# Patient Record
Sex: Male | Born: 1945
Health system: Southern US, Community
[De-identification: ages and names within clinical notes are randomized; demographics above are authoritative.]

## PROBLEM LIST (undated history)

## (undated) DIAGNOSIS — E119 Type 2 diabetes mellitus without complications: Secondary | ICD-10-CM

## (undated) DIAGNOSIS — I493 Ventricular premature depolarization: Secondary | ICD-10-CM

## (undated) DIAGNOSIS — I639 Cerebral infarction, unspecified: Secondary | ICD-10-CM

## (undated) DIAGNOSIS — I5022 Chronic systolic (congestive) heart failure: Secondary | ICD-10-CM

## (undated) DIAGNOSIS — I2699 Other pulmonary embolism without acute cor pulmonale: Secondary | ICD-10-CM

## (undated) DIAGNOSIS — I679 Cerebrovascular disease, unspecified: Secondary | ICD-10-CM

## (undated) DIAGNOSIS — E785 Hyperlipidemia, unspecified: Secondary | ICD-10-CM

## (undated) DIAGNOSIS — R0600 Dyspnea, unspecified: Secondary | ICD-10-CM

## (undated) DIAGNOSIS — I712 Thoracic aortic aneurysm, without rupture, unspecified: Secondary | ICD-10-CM

## (undated) DIAGNOSIS — I2510 Atherosclerotic heart disease of native coronary artery without angina pectoris: Secondary | ICD-10-CM

## (undated) DIAGNOSIS — N400 Enlarged prostate without lower urinary tract symptoms: Secondary | ICD-10-CM

## (undated) DIAGNOSIS — I743 Embolism and thrombosis of arteries of the lower extremities: Secondary | ICD-10-CM

## (undated) DIAGNOSIS — I10 Essential (primary) hypertension: Secondary | ICD-10-CM

## (undated) HISTORY — PX: CARDIAC SURGERY: SHX584

---

## 2000-08-19 HISTORY — PX: PROSTATE SURGERY: SHX751

## 2005-08-19 DIAGNOSIS — I2510 Atherosclerotic heart disease of native coronary artery without angina pectoris: Secondary | ICD-10-CM

## 2005-08-19 HISTORY — DX: Atherosclerotic heart disease of native coronary artery without angina pectoris: I25.10

## 2017-04-17 ENCOUNTER — Inpatient Hospital Stay (HOSPITAL_COMMUNITY)
Admission: EM | Admit: 2017-04-17 | Discharge: 2017-04-20 | DRG: 293 | Disposition: A | Discharge status: Home / Self Care | Payer: Medicaid Other | Attending: Cardiology | Admitting: Cardiology

## 2017-04-17 ENCOUNTER — Emergency Department (HOSPITAL_COMMUNITY): Payer: Medicaid Other

## 2017-04-17 ENCOUNTER — Encounter (HOSPITAL_COMMUNITY): Payer: Self-pay | Admitting: Emergency Medicine

## 2017-04-17 DIAGNOSIS — E7800 Pure hypercholesterolemia, unspecified: Secondary | ICD-10-CM | POA: Diagnosis present

## 2017-04-17 DIAGNOSIS — Z8249 Family history of ischemic heart disease and other diseases of the circulatory system: Secondary | ICD-10-CM

## 2017-04-17 DIAGNOSIS — Z23 Encounter for immunization: Secondary | ICD-10-CM

## 2017-04-17 DIAGNOSIS — Z9889 Other specified postprocedural states: Secondary | ICD-10-CM

## 2017-04-17 DIAGNOSIS — Z951 Presence of aortocoronary bypass graft: Secondary | ICD-10-CM

## 2017-04-17 DIAGNOSIS — Z8673 Personal history of transient ischemic attack (TIA), and cerebral infarction without residual deficits: Secondary | ICD-10-CM

## 2017-04-17 DIAGNOSIS — Z86711 Personal history of pulmonary embolism: Secondary | ICD-10-CM

## 2017-04-17 DIAGNOSIS — E785 Hyperlipidemia, unspecified: Secondary | ICD-10-CM | POA: Diagnosis present

## 2017-04-17 DIAGNOSIS — I110 Hypertensive heart disease with heart failure: Principal | ICD-10-CM | POA: Diagnosis present

## 2017-04-17 DIAGNOSIS — I255 Ischemic cardiomyopathy: Secondary | ICD-10-CM | POA: Diagnosis present

## 2017-04-17 DIAGNOSIS — R718 Other abnormality of red blood cells: Secondary | ICD-10-CM | POA: Diagnosis present

## 2017-04-17 DIAGNOSIS — R0600 Dyspnea, unspecified: Secondary | ICD-10-CM

## 2017-04-17 DIAGNOSIS — I5023 Acute on chronic systolic (congestive) heart failure: Secondary | ICD-10-CM | POA: Diagnosis present

## 2017-04-17 DIAGNOSIS — E119 Type 2 diabetes mellitus without complications: Secondary | ICD-10-CM | POA: Diagnosis present

## 2017-04-17 DIAGNOSIS — I509 Heart failure, unspecified: Secondary | ICD-10-CM

## 2017-04-17 DIAGNOSIS — I2510 Atherosclerotic heart disease of native coronary artery without angina pectoris: Secondary | ICD-10-CM | POA: Diagnosis present

## 2017-04-17 HISTORY — DX: Hyperlipidemia, unspecified: E78.5

## 2017-04-17 HISTORY — DX: Atherosclerotic heart disease of native coronary artery without angina pectoris: I25.10

## 2017-04-17 HISTORY — DX: Cerebral infarction, unspecified: I63.9

## 2017-04-17 HISTORY — DX: Essential (primary) hypertension: I10

## 2017-04-17 HISTORY — DX: Type 2 diabetes mellitus without complications: E11.9

## 2017-04-17 HISTORY — DX: Other pulmonary embolism without acute cor pulmonale: I26.99

## 2017-04-17 LAB — CBC WITH DIFFERENTIAL/PLATELET
Basophils Absolute: 0.1 10*3/uL (ref 0.0–0.1)
Basophils Relative: 1 %
EOS ABS: 0.2 10*3/uL (ref 0.0–0.7)
EOS PCT: 2 %
HCT: 38.3 % — ABNORMAL LOW (ref 39.0–52.0)
Hemoglobin: 13.9 g/dL (ref 13.0–17.0)
LYMPHS ABS: 2.9 10*3/uL (ref 0.7–4.0)
Lymphocytes Relative: 36 %
MCH: 25.7 pg — AB (ref 26.0–34.0)
MCHC: 36.3 g/dL — ABNORMAL HIGH (ref 30.0–36.0)
MCV: 70.8 fL — ABNORMAL LOW (ref 78.0–100.0)
MONO ABS: 0.6 10*3/uL (ref 0.1–1.0)
Monocytes Relative: 7 %
NEUTROS PCT: 54 %
Neutro Abs: 4.2 10*3/uL (ref 1.7–7.7)
PLATELETS: 239 10*3/uL (ref 150–400)
RBC: 5.41 MIL/uL (ref 4.22–5.81)
RDW: 14 % (ref 11.5–15.5)
WBC: 8 10*3/uL (ref 4.0–10.5)

## 2017-04-17 LAB — I-STAT CHEM 8, ED
BUN: 28 mg/dL — AB (ref 6–20)
CALCIUM ION: 1.11 mmol/L — AB (ref 1.15–1.40)
CREATININE: 1 mg/dL (ref 0.61–1.24)
Chloride: 97 mmol/L — ABNORMAL LOW (ref 101–111)
Glucose, Bld: 215 mg/dL — ABNORMAL HIGH (ref 65–99)
HCT: 42 % (ref 39.0–52.0)
HEMOGLOBIN: 14.3 g/dL (ref 13.0–17.0)
Potassium: 3.8 mmol/L (ref 3.5–5.1)
SODIUM: 134 mmol/L — AB (ref 135–145)
TCO2: 25 mmol/L (ref 22–32)

## 2017-04-17 LAB — D-DIMER, QUANTITATIVE: D-Dimer, Quant: 0.77 ug/mL-FEU — ABNORMAL HIGH (ref 0.00–0.50)

## 2017-04-17 LAB — I-STAT TROPONIN, ED: TROPONIN I, POC: 0.06 ng/mL (ref 0.00–0.08)

## 2017-04-17 LAB — GLUCOSE, CAPILLARY
GLUCOSE-CAPILLARY: 174 mg/dL — AB (ref 65–99)
Glucose-Capillary: 216 mg/dL — ABNORMAL HIGH (ref 65–99)

## 2017-04-17 LAB — COMPREHENSIVE METABOLIC PANEL
ALK PHOS: 71 U/L (ref 38–126)
ALT: 34 U/L (ref 17–63)
AST: 31 U/L (ref 15–41)
Albumin: 3.5 g/dL (ref 3.5–5.0)
Anion gap: 11 (ref 5–15)
BILIRUBIN TOTAL: 0.7 mg/dL (ref 0.3–1.2)
BUN: 27 mg/dL — AB (ref 6–20)
CALCIUM: 9.3 mg/dL (ref 8.9–10.3)
CHLORIDE: 98 mmol/L — AB (ref 101–111)
CO2: 24 mmol/L (ref 22–32)
CREATININE: 1.22 mg/dL (ref 0.61–1.24)
GFR, EST NON AFRICAN AMERICAN: 58 mL/min — AB (ref >60)
Glucose, Bld: 217 mg/dL — ABNORMAL HIGH (ref 65–99)
Potassium: 3.8 mmol/L (ref 3.5–5.1)
Sodium: 133 mmol/L — ABNORMAL LOW (ref 135–145)
Total Protein: 6.5 g/dL (ref 6.5–8.1)

## 2017-04-17 LAB — CBC
HEMATOCRIT: 39.8 % (ref 39.0–52.0)
Hemoglobin: 14.4 g/dL (ref 13.0–17.0)
MCH: 25.7 pg — AB (ref 26.0–34.0)
MCHC: 36.2 g/dL — ABNORMAL HIGH (ref 30.0–36.0)
MCV: 71.1 fL — AB (ref 78.0–100.0)
PLATELETS: 241 10*3/uL (ref 150–400)
RBC: 5.6 MIL/uL (ref 4.22–5.81)
RDW: 13.8 % (ref 11.5–15.5)
WBC: 8.7 10*3/uL (ref 4.0–10.5)

## 2017-04-17 LAB — CREATININE, SERUM
Creatinine, Ser: 1.15 mg/dL (ref 0.61–1.24)
GFR calc Af Amer: >60 mL/min (ref >60)
GFR calc non Af Amer: >60 mL/min (ref >60)

## 2017-04-17 LAB — BRAIN NATRIURETIC PEPTIDE: B Natriuretic Peptide: 618.6 pg/mL — ABNORMAL HIGH (ref 0.0–100.0)

## 2017-04-17 LAB — TROPONIN I: Troponin I: <0.03 ng/mL (ref <0.03)

## 2017-04-17 MED ORDER — SODIUM CHLORIDE 0.9% FLUSH
3.0000 mL | Freq: Two times a day (BID) | INTRAVENOUS | Status: DC
  Administered 2017-04-18 – 2017-04-20 (×6): 3 mL via INTRAVENOUS

## 2017-04-17 MED ORDER — FUROSEMIDE 10 MG/ML IJ SOLN
40.0000 mg | Freq: Once | INTRAMUSCULAR | Status: AC
  Administered 2017-04-17: 40 mg via INTRAVENOUS
  Filled 2017-04-17: qty 4

## 2017-04-17 MED ORDER — ONDANSETRON HCL 4 MG/2ML IJ SOLN: 4.0000 mg | Freq: Four times a day (QID) | INTRAMUSCULAR | Status: DC | PRN

## 2017-04-17 MED ORDER — ASPIRIN EC 81 MG PO TBEC
81.0000 mg | DELAYED_RELEASE_TABLET | Freq: Every day | ORAL | Status: DC
  Administered 2017-04-17 – 2017-04-20 (×3): 81 mg via ORAL
  Filled 2017-04-17 (×4): qty 1

## 2017-04-17 MED ORDER — HEPARIN SODIUM (PORCINE) 5000 UNIT/ML IJ SOLN
5000.0000 [IU] | Freq: Three times a day (TID) | INTRAMUSCULAR | Status: DC
  Administered 2017-04-17 – 2017-04-20 (×8): 5000 [IU] via SUBCUTANEOUS
  Filled 2017-04-17 (×8): qty 1

## 2017-04-17 MED ORDER — SODIUM CHLORIDE 0.9 % IV SOLN: 250.0000 mL | INTRAVENOUS | Status: DC | PRN

## 2017-04-17 MED ORDER — PNEUMOCOCCAL VAC POLYVALENT 25 MCG/0.5ML IJ INJ
0.5000 mL | INJECTION | INTRAMUSCULAR | Status: AC
  Administered 2017-04-19: 0.5 mL via INTRAMUSCULAR
  Filled 2017-04-17 (×2): qty 0.5

## 2017-04-17 MED ORDER — ACETAMINOPHEN 325 MG PO TABS: 650.0000 mg | ORAL_TABLET | ORAL | Status: DC | PRN

## 2017-04-17 MED ORDER — ATORVASTATIN CALCIUM 80 MG PO TABS
80.0000 mg | ORAL_TABLET | Freq: Every day | ORAL | Status: DC
  Administered 2017-04-17 – 2017-04-19 (×3): 80 mg via ORAL
  Filled 2017-04-17 (×3): qty 1

## 2017-04-17 MED ORDER — ENSURE ENLIVE PO LIQD
237.0000 mL | Freq: Two times a day (BID) | ORAL | Status: DC
  Administered 2017-04-19 (×2): 237 mL via ORAL

## 2017-04-17 MED ORDER — INSULIN ASPART 100 UNIT/ML ~~LOC~~ SOLN
0.0000 [IU] | Freq: Three times a day (TID) | SUBCUTANEOUS | Status: DC
  Administered 2017-04-18 (×2): 2 [IU] via SUBCUTANEOUS
  Administered 2017-04-19: 3 [IU] via SUBCUTANEOUS
  Administered 2017-04-19: 2 [IU] via SUBCUTANEOUS
  Administered 2017-04-19: 3 [IU] via SUBCUTANEOUS
  Administered 2017-04-20: 2 [IU] via SUBCUTANEOUS
  Administered 2017-04-20: 3 [IU] via SUBCUTANEOUS

## 2017-04-17 MED ORDER — SODIUM CHLORIDE 0.9% FLUSH: 3.0000 mL | INTRAVENOUS | Status: DC | PRN

## 2017-04-17 MED ORDER — INSULIN ASPART 100 UNIT/ML ~~LOC~~ SOLN
0.0000 [IU] | Freq: Every day | SUBCUTANEOUS | Status: DC
  Administered 2017-04-18: 2 [IU] via SUBCUTANEOUS
  Administered 2017-04-19: 3 [IU] via SUBCUTANEOUS

## 2017-04-17 NOTE — Consult Note (Signed)
Cardiology Consultation:   Patient ID: [name]; [id number]; April 29, 1946   Admit date: 04/17/2017 Date of Consult: 04/17/2017  Primary Care Provider: Patient, No Pcp Per Primary Cardiologist: New   Patient Profile:   [name] is a 71 y.o. male with a hx of CABG, ?MI 2007, DM type 2, HTN, stroke who is being seen today for the evaluation of chest pain at the request of Dr. Madilyn Hook.  History of Present Illness:   [name] is [place] speaking. His daughter and son-in-law are with the patient and assist with communication. The patient was living in [place] and has come [place] ago. In 2007 he had trouble breathing and needed bypass surgery. He thinks that he had a clot blocking his heart. This was done in [place]. He had a stroke 2-3 years ago due to high blood pressure. He has never been on blood thinners. He had prostate surgery in 2002, but not cancer. He denies PE or clot to his lungs. He has high cholesterol but is not mediated. He states that he has not been on any cardiac medications or BP meds except aspirin. He only takes Glucophage for diabetes. He had limited care available in [place].  He has never smoked and does not drink alcohol. He has no known family cardiac history.   He has been having occasional bouts of difficulty breathing with exertion for about the last 3 years. This has been getting progressively worse in the recent few weeks and especially in the last 3 days with DOE with walking about 10-15 meters, orthopnea and PND. He denies chest discomfort, palpitations, dizziness, syncope/near syncope. Her feels like this is similar to prior to his CABG.  The patient traveled from [place] about a month ago which took him over 14 hours of air travel.  Pertinent findings: EKG Sinus rhythm 83 bpm, probable LAE, IVCD, consider atypical RBBB, LVH with secondary repolarization abnormality, Anterior ST elevation probably due to LVH, reciprocal TWI V5-6 BNP 618.6 Troponin  0.06 (POC nl <0.08) SCr  1.22 > 1.00,   K+ 3.8,   Hgb 13.9,   WBC 8.0 CXR:  Mild enlargement of the cardiac silhouette. No pulmonary vascular congestion, pleural effusion, or pneumonia. Thoracic aortic atherosclerosis.  Past Medical History:  Diagnosis Date  . CAD (coronary artery disease) 2007   CABG 2007 in [place]  . Diabetes mellitus without complication ([name])   . Hypertension   . Pulmonary embolism ([name])   . Stroke [facility])     Past Surgical History:  Procedure Laterality Date  . PROSTATE SURGERY  2002     Home Medications:  Prior to Admission medications   Not on File    Inpatient Medications: Scheduled Meds:  Continuous Infusions:  PRN Meds:   Allergies:   Not on File  Social History:   Social History   Social History  . Marital status: Married    Spouse name: N/A  . Number of children: N/A  . Years of education: N/A   Occupational History  . Not on file.   Social History Main Topics  . Smoking status: Never Smoker  . Smokeless tobacco: Never Used  . Alcohol use No  . Drug use: No  . Sexual activity: Not on file   Other Topics Concern  . Not on file   Social History Narrative   Pt is from [place] and speaks [place]. He came to the [place] in 02/2017.   He has a daughter and son-in-law that  speak [place].     Family History:    Family History  Problem Relation Age of Onset  . Hypertension Mother   . Hypertension Sister      ROS:  Please see the history of present illness.  ROS  All other ROS reviewed and negative except for loss of sensation in the toes.    Physical Exam/Data:   Vitals:   04/17/17 1115 04/17/17 1130 04/17/17 1145 04/17/17 1200  BP: 112/72 113/81 111/76 116/80  Pulse: (!) 101 99 (!) 127 (!) 57  Resp: (!) 26 17 16  (!) 23  SpO2: 100% 93% (!) 85% 94%  Weight:      Height:       No intake or output data in the 24 hours ending 04/17/17 1313 Filed Weights   04/17/17 1027  Weight: 180 lb (81.6 kg)   Body mass index is 26.58  kg/m.  General:  Well nourished, well developed, in no acute distress HEENT: normal Lymph: no adenopathy Neck: no JVD Endocrine:  No thryomegaly Vascular: No carotid bruits; FA pulses 2+ bilaterally without bruits  Cardiac:  normal S1, S2; irregularly irregular rhythm, 2/6 systolic murmur LUSB Lungs:  clear to auscultation bilaterally, no wheezing, rhonchi or rales  Abd: soft, nontender, no hepatomegaly  Ext: no edema Musculoskeletal:  No deformities, BUE and BLE strength normal and equal Skin: warm and dry  Neuro:  CNs 2-12 intact, no focal abnormalities noted Psych:  Normal affect   EKG:  The EKG was personally reviewed and demonstrates:  Sinus rhythm 83 bpm, probable LAE, IVCD, consider atypical RBBB, LVH with secondary repolarization abnormality, Anterior ST elevation probably due to LVH, reciprocal TWI V5-6 Telemetry:  Telemetry was personally reviewed and demonstrates:  Sinus rhythm with PVC's in the 80's  Relevant CV Studies:  None available  Laboratory Data:  Chemistry  Recent Labs Lab 04/17/17 1050 04/17/17 1106  NA 133* 134*  K 3.8 3.8  CL 98* 97*  CO2 24  --   GLUCOSE 217* 215*  BUN 27* 28*  CREATININE 1.22 1.00  CALCIUM 9.3  --   GFRNONAA 58*  --   GFRAA >60  --   ANIONGAP 11  --      Recent Labs Lab 04/17/17 1050  PROT 6.5  ALBUMIN 3.5  AST 31  ALT 34  ALKPHOS 71  BILITOT 0.7   Hematology  Recent Labs Lab 04/17/17 1050 04/17/17 1106  WBC 8.0  --   RBC 5.41  --   HGB 13.9 14.3  HCT 38.3* 42.0  MCV 70.8*  --   MCH 25.7*  --   MCHC 36.3*  --   RDW 14.0  --   PLT 239  --    Cardiac EnzymesNo results for input(s): TROPONINI in the last 168 hours.   Recent Labs Lab 04/17/17 1104  TROPIPOC 0.06    BNP  Recent Labs Lab 04/17/17 1050  BNP 618.6*    DDimer No results for input(s): DDIMER in the last 168 hours.  Radiology/Studies:  Dg Chest Port 1 View  Result Date: 04/17/2017 CLINICAL DATA:  Chest pain and shortness of  breath for the past 3 days. History of diabetes, previous pulmonary embolism, nonsmoker. EXAM: PORTABLE CHEST 1 VIEW COMPARISON:  None in PACs FINDINGS: The lungs are adequately inflated and clear. There is no pleural effusion or pneumothorax. The cardiac silhouette is enlarged. The patient has undergone previous median sternotomy. There is calcification in the wall of the aortic arch. The pulmonary vascularity is  normal. IMPRESSION: Mild enlargement of the cardiac silhouette. No pulmonary vascular congestion, pleural effusion, or pneumonia. Thoracic aortic atherosclerosis. Electronically Signed   By: David  [place] M.D.   On: 04/17/2017 10:46    Assessment and Plan:   Shortness of breath -Pt with progressively worsening shortness of breath over several years but much worse over the last 3 days with DOE walking 10-15 meters, orthopnea and PND. No chest discomfort. -Hx of CABG in 2007 in [place] (Pt lived in [place]) with limited follow up cardiac care. On no cardiac meds except aspirin 81 mg. No records available.  -BNP 618.6.   CXR did not show edema. No peripheral edema. Lungs clear. -Will admit for further evaluation.  -Will check echocardiogram and discuss further cardiac evaluation with Dr. Lucinda Dell plan for stress test tomorrow as long as troponins remain negative.  -Pt traveled from [place] about a month ago with over 14 hours of air travel. Will check D-dimer to rule out PE.   Heart failure -Pt with DOE, orthopnea and PND. No peripheral edema.  -Hx of CAD with CABG, no known heart failure per pt but he had limited medical care at his home in [place].  -BNP is elevated at 618.6. CXR does not show edema. -Will diurese to manage symptoms and check echocardiogram.   CAD -Currently no chest pain, but DOE -S/P CABG 2007 -On no medical therapy -Pt will need to started on BB, aspirin, statin, ACEI/ARB as BP allows.  Hypertension -Apparently had hypertensive crisis 2-3 years ago with stroke, no  residual -Blood pressure is well controlled here. Continue to monitor.  Hyperlipidemia -Pt not currently treated.  -Will check lipid panel and initiate high intensity statin.  Diabetes type 2 -Will check hemoglobin A1c -SSI while hospitalized. Hold Glucophage while hospitalized.  Signed, Berton Bon, NP  04/17/2017 1:13 PM   As above, patient seen and examined. Briefly he is a 71 year old male from [place] ( patient does not speak English; help with interpretation by patient's family) with past medical history of coronary artery disease status post coronary artery bypass graft, myocardial infarction, diabetes, hypertension, prior CVA with complaints of dyspnea. No previous records available. Patient apparently has had dyspnea on exertion for 3 years. He recently came to [place] one month ago and has noticed worsening dyspnea on exertion. There is occasional orthopnea but no PND or pedal edema. He denies chest pain, hemoptysis, fevers or chills or productive cough. Laboratories show BNP 618, hemoglobin 13.9 with MCV 70.8. Chest x-ray with no edema.  1 dyspnea-etiology unclear. Patient not volume overloaded on examination but BNP mildly elevated. Will give Lasix 40 mg IV 1. Check echocardiogram for LV function. Cycle enzymes. If negative, proceed with Lexiscan nuclear study tomorrow morning. His dyspnea sounds chronic but note he had dyspnea at time of myocardial infarction by his report. Also note he traveled 14 hours biplane one month ago. Check d-dimer and if elevated schedule CTA to exclude pulmonary embolus.  2 hypertension-lood pressure is controlled.  3 coronary artery disease-continue aspirin. Add statin. Begin Lipitor 40 mg daily. Will need lipids and liver in 4 weeks.  4 diabetes mellitus-hold Glucophage for now and resume after it is clear he will not require procedures. Follow CBGs.  5 Microcytosis-check stool Hemoccults. Follow up primary care after discharge. May need GI  evaluation. There are targets noted and this may suggest thalassemia.  Olga Millers, MD

## 2017-04-17 NOTE — Progress Notes (Signed)
Patient admitted to [facility] from Emergency Room, denies chest pain, VSS.  Son in Social workerlaw and niece in room with patient.

## 2017-04-17 NOTE — ED Notes (Signed)
Pt states he has a hx of heart problems (possible MI hx in 2007??) Pt states this feels the same as then.

## 2017-04-17 NOTE — ED Provider Notes (Signed)
[facility]-EMERGENCY DEPT Provider Note   CSN: [id number] Arrival date & time: 04/17/17  1014     History   Chief Complaint Chief Complaint  Patient presents with  . Chest Pain  . Shortness of Breath    HPI [name] is a 71 y.o. male.  The history is provided by the patient, a relative and the EMS personnel. No language interpreter was used.  Chest Pain   Associated symptoms include shortness of breath.  Shortness of Breath  Associated symptoms include chest pain.    [name] is a 71 y.o. male who presents to the Emergency Department complaining of sob.  Level V caveat due to language barrier.  Hx is provided by patient and family. He has a history of 2 vessel CABG performed in [place] in 2007, DM. Over the last 3 days he's had severe, progressive shortness of breath and dyspnea on exertion with associated diaphoresis. He has intermittent chest heaviness described as a pressure sensation. No fevers, vomiting, abdominal pain, diarrhea, leg swelling or pain.  Past Medical History:  Diagnosis Date  . CAD (coronary artery disease) 2007   CABG 2007 in [place]  . Diabetes mellitus without complication ([name])   . Hyperlipidemia   . Hypertension   . Pulmonary embolism ([name])   . Stroke [facility])     Patient Active Problem List   Diagnosis Date Noted  . CHF (congestive heart failure) ([name]) 04/17/2017    Past Surgical History:  Procedure Laterality Date  . PROSTATE SURGERY  2002       Home Medications    Prior to Admission medications   Medication Sig Start Date End Date Taking? Authorizing Provider  aspirin EC 81 MG tablet Take 81 mg by mouth daily.   Yes [provider]  metFORMIN (GLUCOPHAGE) 1000 MG tablet Take 1,000 mg by mouth daily with breakfast.   Yes [provider]    Family History Family History  Problem Relation Age of Onset  . Hypertension Mother   . Hypertension Sister     Social History Social History  Substance Use Topics  .  Smoking status: Never Smoker  . Smokeless tobacco: Never Used  . Alcohol use No     Allergies   Patient has no allergy information on record.   Review of Systems Review of Systems  Respiratory: Positive for shortness of breath.   Cardiovascular: Positive for chest pain.  All other systems reviewed and are negative.    Physical Exam Updated Vital Signs BP 125/84 (BP Location: Right Arm)   Pulse 88   Temp 98.1 F (36.7 C) (Oral)   Resp 18   Ht 6' (1.829 m)   Wt 83.2 kg (183 lb 7 oz)   SpO2 100%   BMI 24.88 kg/m   Physical Exam  Constitutional: He is oriented to person, place, and time. He appears well-developed and well-nourished.  HENT:  Head: Normocephalic and atraumatic.  Cardiovascular: Normal rate and regular rhythm.   No murmur heard. Pulmonary/Chest: Effort normal and breath sounds normal. No respiratory distress.  Abdominal: Soft. There is no tenderness. There is no rebound and no guarding.  Musculoskeletal: He exhibits no edema or tenderness.  Neurological: He is alert and oriented to person, place, and time.  Skin: Skin is warm and dry.  Psychiatric: He has a normal mood and affect. His behavior is normal.  Nursing note and vitals reviewed.    ED Treatments / Results  Labs (all labs ordered are listed, but only abnormal  results are displayed) Labs Reviewed  COMPREHENSIVE METABOLIC PANEL - Abnormal; Notable for the following:       Result Value   Sodium 133 (*)    Chloride 98 (*)    Glucose, Bld 217 (*)    BUN 27 (*)    GFR calc non Af Amer 58 (*)    All other components within normal limits  CBC WITH DIFFERENTIAL/PLATELET - Abnormal; Notable for the following:    HCT 38.3 (*)    MCV 70.8 (*)    MCH 25.7 (*)    MCHC 36.3 (*)    All other components within normal limits  BRAIN NATRIURETIC PEPTIDE - Abnormal; Notable for the following:    B Natriuretic Peptide 618.6 (*)    All other components within normal limits  I-STAT CHEM 8, ED - Abnormal;  Notable for the following:    Sodium 134 (*)    Chloride 97 (*)    BUN 28 (*)    Glucose, Bld 215 (*)    Calcium, Ion 1.11 (*)    All other components within normal limits  D-DIMER, QUANTITATIVE (NOT AT [facility])  CBC  CREATININE, SERUM  TROPONIN I  TROPONIN I  TROPONIN I  I-STAT TROPONIN, ED    EKG  EKG Interpretation  Date/Time:  Thursday April 17 2017 10:55:10 EDT Ventricular Rate:  83 PR Interval:    QRS Duration: 132 QT Interval:  389 QTC Calculation: 458 R Axis:   -24 Text Interpretation:  Sinus rhythm Probable left atrial enlargement IVCD, consider atypical RBBB LVH with secondary repolarization abnormality Anterior ST elevation, probably due to LVH no prior available for comparison Confirmed by Tilden Fossa [phone]) on 04/17/2017 10:58:23 AM       Radiology Dg Chest Port 1 View  Result Date: 04/17/2017 CLINICAL DATA:  Chest pain and shortness of breath for the past 3 days. History of diabetes, previous pulmonary embolism, nonsmoker. EXAM: PORTABLE CHEST 1 VIEW COMPARISON:  None in PACs FINDINGS: The lungs are adequately inflated and clear. There is no pleural effusion or pneumothorax. The cardiac silhouette is enlarged. The patient has undergone previous median sternotomy. There is calcification in the wall of the aortic arch. The pulmonary vascularity is normal. IMPRESSION: Mild enlargement of the cardiac silhouette. No pulmonary vascular congestion, pleural effusion, or pneumonia. Thoracic aortic atherosclerosis. Electronically Signed   By: David  [place] M.D.   On: 04/17/2017 10:46    Procedures Procedures (including critical care time)  Medications Ordered in ED Medications  sodium chloride flush (NS) 0.9 % injection 3 mL (not administered)  sodium chloride flush (NS) 0.9 % injection 3 mL (not administered)  0.9 %  sodium chloride infusion (not administered)  acetaminophen (TYLENOL) tablet 650 mg (not administered)  ondansetron (ZOFRAN) injection 4 mg (not  administered)  heparin injection 5,000 Units (not administered)  furosemide (LASIX) injection 40 mg (not administered)  aspirin EC tablet 81 mg (not administered)  atorvastatin (LIPITOR) tablet 80 mg (not administered)  pneumococcal 23 valent vaccine (PNU-IMMUNE) injection 0.5 mL (not administered)     Initial Impression / Assessment and Plan / ED Course  I have reviewed the triage vital signs and the nursing notes.  Pertinent labs & imaging results that were available during my care of the patient were reviewed by me and considered in my medical decision making (see chart for details).     Patient with history of cardiac disease here for evaluation of progressive dyspnea on exertion with diaphoresis and chest pressure. He is  in no distress in the emergency department. EKG with LVH with QRS widening, no priors available for comparison. Concern for unstable angina versus heart failure. Cardiology consulted for further treatment.  Final Clinical Impressions(s) / ED Diagnoses   Final diagnoses:  None    New Prescriptions Current Discharge Medication List       Tilden Fossaees, Daejah Klebba, MD 04/17/17 1625

## 2017-04-17 NOTE — H&P (Signed)
Cardiology H&P:   Patient ID: [name]; [id number]; [birth year]   Admit date: 04/17/2017 Date of Consult: 04/17/2017  Primary Care Provider: Patient, No Pcp Per Primary Cardiologist: New   Patient Profile:   [name] is a 71 y.o. male with a hx of CABG, ?MI 2007, DM type 2, HTN, stroke who is being seen today for the evaluation of chest pain at the request of Dr. Madilyn Hook.  History of Present Illness:   [name] is [place] speaking. His daughter and son-in-law are with the patient and assist with communication. The patient was living in [place] and has [place] ago. In 2007 he had trouble breathing and needed bypass surgery. He thinks that he had a clot blocking his heart. This was done in [place]. He had a stroke 2-3 years ago due to high blood pressure. He has never been on blood thinners. He had prostate surgery in 2002, but not cancer. He denies PE or clot to his lungs. He has high cholesterol but is not mediated. He states that he has not been on any cardiac medications or BP meds except aspirin. He only takes Glucophage for diabetes. He had limited care available in [place].  He has never smoked and does not drink alcohol. He has no known family cardiac history.   He has been having occasional bouts of difficulty breathing with exertion for about the last 3 years. This has been getting progressively worse in the recent few weeks and especially in the last 3 days with DOE with walking about 10-15 meters, orthopnea and PND. He denies chest discomfort, palpitations, dizziness, syncope/near syncope. Her feels like this is similar to prior to his CABG.  The patient traveled from [place] about a month ago which took him over 14 hours of air travel.  Pertinent findings: EKG Sinus rhythm 83 bpm, probable LAE, IVCD, consider atypical RBBB, LVH with secondary repolarization abnormality, Anterior ST elevation probably due to LVH, reciprocal TWI V5-6 BNP 618.6 Troponin 0.06  (POC nl <0.08) SCr  1.22 > 1.00,   K+ 3.8,   Hgb 13.9,   WBC 8.0 CXR:  Mild enlargement of the cardiac silhouette. No pulmonary vascular congestion, pleural effusion, or pneumonia. Thoracic aortic atherosclerosis.  Past Medical History:  Diagnosis Date  . CAD (coronary artery disease) 2007   CABG 2007 in [place]  . Diabetes mellitus without complication ([name])   . Hypertension   . Pulmonary embolism ([name])   . Stroke [facility])     Past Surgical History:  Procedure Laterality Date  . PROSTATE SURGERY  2002     Home Medications:  Prior to Admission medications   Not on File    Inpatient Medications: Scheduled Meds:  Continuous Infusions:  PRN Meds:   Allergies:   Not on File  Social History:   Social History   Social History  . Marital status: Married    Spouse name: N/A  . Number of children: N/A  . Years of education: N/A   Occupational History  . Not on file.   Social History Main Topics  . Smoking status: Never Smoker  . Smokeless tobacco: Never Used  . Alcohol use No  . Drug use: No  . Sexual activity: Not on file   Other Topics Concern  . Not on file   Social History Narrative   Pt is from [place] and speaks [place]. He came to the [place] in 02/2017.   He has a daughter and son-in-law that  speak [place].     Family History:    Family History  Problem Relation Age of Onset  . Hypertension Mother   . Hypertension Sister      ROS:  Please see the history of present illness.  ROS  All other ROS reviewed and negative except for loss of sensation in the toes.    Physical Exam/Data:   Vitals:   04/17/17 1115 04/17/17 1130 04/17/17 1145 04/17/17 1200  BP: 112/72 113/81 111/76 116/80  Pulse: (!) 101 99 (!) 127 (!) 57  Resp: (!) 26 17 16  (!) 23  SpO2: 100% 93% (!) 85% 94%  Weight:      Height:       No intake or output data in the 24 hours ending 04/17/17 1313 Filed Weights   04/17/17 1027  Weight: 180 lb (81.6 kg)   Body mass index is 26.58 kg/m.   General:  Well nourished, well developed, in no acute distress HEENT: normal Lymph: no adenopathy Neck: no JVD Endocrine:  No thryomegaly Vascular: No carotid bruits; FA pulses 2+ bilaterally without bruits  Cardiac:  normal S1, S2; irregularly irregular rhythm, 2/6 systolic murmur LUSB Lungs:  clear to auscultation bilaterally, no wheezing, rhonchi or rales  Abd: soft, nontender, no hepatomegaly  Ext: no edema Musculoskeletal:  No deformities, BUE and BLE strength normal and equal Skin: warm and dry  Neuro:  CNs 2-12 intact, no focal abnormalities noted Psych:  Normal affect   EKG:  The EKG was personally reviewed and demonstrates:  Sinus rhythm 83 bpm, probable LAE, IVCD, consider atypical RBBB, LVH with secondary repolarization abnormality, Anterior ST elevation probably due to LVH, reciprocal TWI V5-6 Telemetry:  Telemetry was personally reviewed and demonstrates:  Sinus rhythm with PVC's in the 80's  Relevant CV Studies:  None available  Laboratory Data:  Chemistry  Recent Labs Lab 04/17/17 1050 04/17/17 1106  NA 133* 134*  K 3.8 3.8  CL 98* 97*  CO2 24  --   GLUCOSE 217* 215*  BUN 27* 28*  CREATININE 1.22 1.00  CALCIUM 9.3  --   GFRNONAA 58*  --   GFRAA >60  --   ANIONGAP 11  --      Recent Labs Lab 04/17/17 1050  PROT 6.5  ALBUMIN 3.5  AST 31  ALT 34  ALKPHOS 71  BILITOT 0.7   Hematology  Recent Labs Lab 04/17/17 1050 04/17/17 1106  WBC 8.0  --   RBC 5.41  --   HGB 13.9 14.3  HCT 38.3* 42.0  MCV 70.8*  --   MCH 25.7*  --   MCHC 36.3*  --   RDW 14.0  --   PLT 239  --    Cardiac EnzymesNo results for input(s): TROPONINI in the last 168 hours.   Recent Labs Lab 04/17/17 1104  TROPIPOC 0.06    BNP  Recent Labs Lab 04/17/17 1050  BNP 618.6*    DDimer No results for input(s): DDIMER in the last 168 hours.  Radiology/Studies:  Dg Chest Port 1 View  Result Date: 04/17/2017 CLINICAL DATA:  Chest pain and shortness of breath for  the past 3 days. History of diabetes, previous pulmonary embolism, nonsmoker. EXAM: PORTABLE CHEST 1 VIEW COMPARISON:  None in PACs FINDINGS: The lungs are adequately inflated and clear. There is no pleural effusion or pneumothorax. The cardiac silhouette is enlarged. The patient has undergone previous median sternotomy. There is calcification in the wall of the aortic arch. The pulmonary vascularity is  normal. IMPRESSION: Mild enlargement of the cardiac silhouette. No pulmonary vascular congestion, pleural effusion, or pneumonia. Thoracic aortic atherosclerosis. Electronically Signed   By: David  [place] M.D.   On: 04/17/2017 10:46    Assessment and Plan:   Shortness of breath -Pt with progressively worsening shortness of breath over several years but much worse over the last 3 days with DOE walking 10-15 meters, orthopnea and PND. No chest discomfort. -Hx of CABG in 2007 in [place] (Pt lived in [place]) with limited follow up cardiac care. On no cardiac meds except aspirin 81 mg. No records available.  -BNP 618.6.   CXR did not show edema. No peripheral edema. Lungs clear. -Will admit for further evaluation.  -Will check echocardiogram and discuss further cardiac evaluation with Dr. Lucinda Dell plan for stress test tomorrow as long as troponins remain negative.  -Pt traveled from [place] about a month ago with over 14 hours of air travel. Will check D-dimer to rule out PE.   Heart failure -Pt with DOE, orthopnea and PND. No peripheral edema.  -Hx of CAD with CABG, no known heart failure per pt but he had limited medical care at his home in [place].  -BNP is elevated at 618.6. CXR does not show edema. -Will diurese to manage symptoms and check echocardiogram.   CAD -Currently no chest pain, but DOE -S/P CABG 2007 -On no medical therapy -Pt will need to started on BB, aspirin, statin, ACEI/ARB as BP allows.  Hypertension -Apparently had hypertensive crisis 2-3 years ago with stroke, no  residual -Blood pressure is well controlled here. Continue to monitor.  Hyperlipidemia -Pt not currently treated.  -Will check lipid panel and initiate high intensity statin.  Diabetes type 2 -Will check hemoglobin A1c -SSI while hospitalized. Hold Glucophage while hospitalized.  Signed, Berton Bon, NP  04/17/2017 1:13 PM   As above, patient seen and examined. Briefly he is a 71 year old male from [place] ( patient does not speak English; help with interpretation by patient's family) with past medical history of coronary artery disease status post coronary artery bypass graft, myocardial infarction, diabetes, hypertension, prior CVA with complaints of dyspnea. No previous records available. Patient apparently has had dyspnea on exertion for 3 years. He recently came to [place] one month ago and has noticed worsening dyspnea on exertion. There is occasional orthopnea but no PND or pedal edema. He denies chest pain, hemoptysis, fevers or chills or productive cough. Laboratories show BNP 618, hemoglobin 13.9 with MCV 70.8. Chest x-ray with no edema.  1 dyspnea-etiology unclear. Patient not volume overloaded on examination but BNP mildly elevated. Will give Lasix 40 mg IV 1. Check echocardiogram for LV function. Cycle enzymes. If negative, proceed with Lexiscan nuclear study tomorrow morning. His dyspnea sounds chronic but note he had dyspnea at time of myocardial infarction by his report. Also note he traveled 14 hours biplane one month ago. Check d-dimer and if elevated schedule CTA to exclude pulmonary embolus.  2 hypertension-lood pressure is controlled.  3 coronary artery disease-continue aspirin. Add statin. Begin Lipitor 40 mg daily. Will need lipids and liver in 4 weeks.  4 diabetes mellitus-hold Glucophage for now and resume after it is clear he will not require procedures. Follow CBGs.  5 Microcytosis-check stool Hemoccults. Follow up primary care after discharge. May need GI  evaluation. There are targets noted and this may suggest thalassemia.  Olga Millers, MD

## 2017-04-17 NOTE — ED Triage Notes (Signed)
[facility], French-speaking c/o chest pain, sob x 3 days. Given 324 ASA en route, no NTG. Hx of CABG, DM and possible PE. BP 100/72, sats 96% on RA

## 2017-04-18 ENCOUNTER — Observation Stay (HOSPITAL_BASED_OUTPATIENT_CLINIC_OR_DEPARTMENT_OTHER): Payer: Medicaid Other

## 2017-04-18 ENCOUNTER — Encounter (HOSPITAL_COMMUNITY): Payer: Self-pay | Admitting: Radiology

## 2017-04-18 ENCOUNTER — Observation Stay (HOSPITAL_COMMUNITY): Payer: Medicaid Other

## 2017-04-18 DIAGNOSIS — Z9889 Other specified postprocedural states: Secondary | ICD-10-CM | POA: Diagnosis not present

## 2017-04-18 DIAGNOSIS — Z86711 Personal history of pulmonary embolism: Secondary | ICD-10-CM | POA: Diagnosis not present

## 2017-04-18 DIAGNOSIS — I255 Ischemic cardiomyopathy: Secondary | ICD-10-CM | POA: Diagnosis present

## 2017-04-18 DIAGNOSIS — Z951 Presence of aortocoronary bypass graft: Secondary | ICD-10-CM | POA: Diagnosis present

## 2017-04-18 DIAGNOSIS — Z23 Encounter for immunization: Secondary | ICD-10-CM | POA: Diagnosis not present

## 2017-04-18 DIAGNOSIS — I2510 Atherosclerotic heart disease of native coronary artery without angina pectoris: Secondary | ICD-10-CM | POA: Diagnosis present

## 2017-04-18 DIAGNOSIS — R0600 Dyspnea, unspecified: Secondary | ICD-10-CM

## 2017-04-18 DIAGNOSIS — E119 Type 2 diabetes mellitus without complications: Secondary | ICD-10-CM | POA: Diagnosis present

## 2017-04-18 DIAGNOSIS — Z8249 Family history of ischemic heart disease and other diseases of the circulatory system: Secondary | ICD-10-CM | POA: Diagnosis not present

## 2017-04-18 DIAGNOSIS — I5023 Acute on chronic systolic (congestive) heart failure: Secondary | ICD-10-CM | POA: Diagnosis present

## 2017-04-18 DIAGNOSIS — E7800 Pure hypercholesterolemia, unspecified: Secondary | ICD-10-CM | POA: Diagnosis present

## 2017-04-18 DIAGNOSIS — I351 Nonrheumatic aortic (valve) insufficiency: Secondary | ICD-10-CM

## 2017-04-18 DIAGNOSIS — I340 Nonrheumatic mitral (valve) insufficiency: Secondary | ICD-10-CM

## 2017-04-18 DIAGNOSIS — E785 Hyperlipidemia, unspecified: Secondary | ICD-10-CM | POA: Diagnosis present

## 2017-04-18 DIAGNOSIS — Z8673 Personal history of transient ischemic attack (TIA), and cerebral infarction without residual deficits: Secondary | ICD-10-CM | POA: Diagnosis not present

## 2017-04-18 DIAGNOSIS — I110 Hypertensive heart disease with heart failure: Secondary | ICD-10-CM | POA: Diagnosis not present

## 2017-04-18 DIAGNOSIS — R718 Other abnormality of red blood cells: Secondary | ICD-10-CM | POA: Diagnosis present

## 2017-04-18 LAB — GLUCOSE, CAPILLARY
GLUCOSE-CAPILLARY: 181 mg/dL — AB (ref 65–99)
GLUCOSE-CAPILLARY: 198 mg/dL — AB (ref 65–99)
Glucose-Capillary: 174 mg/dL — ABNORMAL HIGH (ref 65–99)

## 2017-04-18 LAB — BASIC METABOLIC PANEL
Anion gap: 11 (ref 5–15)
BUN: 28 mg/dL — AB (ref 6–20)
CHLORIDE: 99 mmol/L — AB (ref 101–111)
CO2: 23 mmol/L (ref 22–32)
Calcium: 9.3 mg/dL (ref 8.9–10.3)
Creatinine, Ser: 1.2 mg/dL (ref 0.61–1.24)
GFR calc non Af Amer: 60 mL/min — ABNORMAL LOW (ref >60)
GLUCOSE: 155 mg/dL — AB (ref 65–99)
POTASSIUM: 3.8 mmol/L (ref 3.5–5.1)
SODIUM: 133 mmol/L — AB (ref 135–145)

## 2017-04-18 LAB — NM MYOCAR MULTI W/SPECT W/WALL MOTION / EF
CHL CUP RESTING HR STRESS: 92 {beats}/min
CSEPED: 5 min
CSEPEDS: 24 s
LV dias vol: 266 mL (ref 62–150)
LVSYSVOL: 218 mL
NUC STRESS TID: 1.09
Peak HR: 114 {beats}/min

## 2017-04-18 LAB — ECHOCARDIOGRAM COMPLETE
HEIGHTINCHES: 72 in
Weight: 2952.4 oz

## 2017-04-18 LAB — TROPONIN I: Troponin I: <0.03 ng/mL (ref <0.03)

## 2017-04-18 MED ORDER — REGADENOSON 0.4 MG/5ML IV SOLN
INTRAVENOUS | Status: AC
  Administered 2017-04-18: 0.4 mg via INTRAVENOUS
  Filled 2017-04-18: qty 5

## 2017-04-18 MED ORDER — TECHNETIUM TC 99M TETROFOSMIN IV KIT
30.0000 | PACK | Freq: Once | INTRAVENOUS | Status: AC | PRN
  Administered 2017-04-18: 30 via INTRAVENOUS

## 2017-04-18 MED ORDER — IOPAMIDOL (ISOVUE-370) INJECTION 76%
INTRAVENOUS | Status: AC
  Administered 2017-04-18: 100 mL
  Filled 2017-04-18: qty 100

## 2017-04-18 MED ORDER — OXYCODONE-ACETAMINOPHEN 5-325 MG PO TABS
1.0000 | ORAL_TABLET | Freq: Once | ORAL | Status: AC
  Administered 2017-04-18: 1 via ORAL
  Filled 2017-04-18: qty 1

## 2017-04-18 MED ORDER — REGADENOSON 0.4 MG/5ML IV SOLN
0.4000 mg | Freq: Once | INTRAVENOUS | Status: AC
  Administered 2017-04-18: 0.4 mg via INTRAVENOUS

## 2017-04-18 MED ORDER — TECHNETIUM TC 99M TETROFOSMIN IV KIT
10.0000 | PACK | Freq: Once | INTRAVENOUS | Status: AC | PRN
  Administered 2017-04-18: 10 via INTRAVENOUS

## 2017-04-18 NOTE — Progress Notes (Signed)
Progress Note  Patient Name: [name] Date of Encounter: 04/18/2017  Primary Cardiologist: Dr Jens Somrenshaw  Subjective   Mild improvement in dyspnea; no chest pain  Inpatient Medications    Scheduled Meds: . aspirin EC  81 mg Oral Daily  . atorvastatin  80 mg Oral q1800  . feeding supplement (ENSURE ENLIVE)  237 mL Oral BID BM  . heparin  5,000 Units Subcutaneous Q8H  . insulin aspart  0-5 Units Subcutaneous QHS  . insulin aspart  0-9 Units Subcutaneous TID WC  . pneumococcal 23 valent vaccine  0.5 mL Intramuscular Tomorrow-1000  . sodium chloride flush  3 mL Intravenous Q12H   Continuous Infusions: . sodium chloride     PRN Meds: sodium chloride, acetaminophen, ondansetron (ZOFRAN) IV, sodium chloride flush   Vital Signs    Vitals:   04/17/17 1951 04/18/17 0422 04/18/17 0759 04/18/17 0844  BP: 107/74 108/85 (!) 115/58 (!) 109/53  Pulse: 86 82 87 88  Resp:  18 18 16   Temp: 98.1 F (36.7 C) 98 F (36.7 C) 98.9 F (37.2 C) 97.8 F (36.6 C)  TempSrc: Oral Oral Oral Oral  SpO2: 100% 100% 96% 99%  Weight:  83.7 kg (184 lb 8.4 oz)    Height:        Intake/Output Summary (Last 24 hours) at 04/18/17 1100 Last data filed at 04/18/17 1000  Gross per 24 hour  Intake                0 ml  Output             1300 ml  Net            -1300 ml   Filed Weights   04/17/17 1027 04/17/17 1552 04/18/17 0422  Weight: 81.6 kg (180 lb) 83.2 kg (183 lb 7 oz) 83.7 kg (184 lb 8.4 oz)    Telemetry    Sinus- Personally Reviewed    Physical Exam   GEN: No acute distress.   Neck: No JVD Cardiac: RRR, no murmurs, rubs, or gallops.  Respiratory: Clear to auscultation bilaterally. GI: Soft, nontender, non-distended  MS: No edema; No deformity. Neuro:  Nonfocal  Psych: Normal affect   Labs    Chemistry Recent Labs Lab 04/17/17 1050 04/17/17 1106 04/17/17 1656 04/18/17 0356  NA 133* 134*  --  133*  K 3.8 3.8  --  3.8  CL 98* 97*  --  99*  CO2 24  --   --  23    GLUCOSE 217* 215*  --  155*  BUN 27* 28*  --  28*  CREATININE 1.22 1.00 1.15 1.20  CALCIUM 9.3  --   --  9.3  PROT 6.5  --   --   --   ALBUMIN 3.5  --   --   --   AST 31  --   --   --   ALT 34  --   --   --   ALKPHOS 71  --   --   --   BILITOT 0.7  --   --   --   GFRNONAA 58*  --  >60 60*  GFRAA >60  --  >60 >60  ANIONGAP 11  --   --  11     Hematology Recent Labs Lab 04/17/17 1050 04/17/17 1106 04/17/17 1656  WBC 8.0  --  8.7  RBC 5.41  --  5.60  HGB 13.9 14.3 14.4  HCT 38.3* 42.0 39.8  MCV 70.8*  --  71.1*  MCH 25.7*  --  25.7*  MCHC 36.3*  --  36.2*  RDW 14.0  --  13.8  PLT 239  --  241    Cardiac Enzymes Recent Labs Lab 04/17/17 1656 04/17/17 2205 04/18/17 0356  TROPONINI <0.03 <0.03 <0.03    Recent Labs Lab 04/17/17 1104  TROPIPOC 0.06     BNP Recent Labs Lab 04/17/17 1050  BNP 618.6*     DDimer  Recent Labs Lab 04/17/17 1656  DDIMER 0.77*     Radiology    Dg Chest Port 1 View  Result Date: 04/17/2017 CLINICAL DATA:  Chest pain and shortness of breath for the past 3 days. History of diabetes, previous pulmonary embolism, nonsmoker. EXAM: PORTABLE CHEST 1 VIEW COMPARISON:  None in PACs FINDINGS: The lungs are adequately inflated and clear. There is no pleural effusion or pneumothorax. The cardiac silhouette is enlarged. The patient has undergone previous median sternotomy. There is calcification in the wall of the aortic arch. The pulmonary vascularity is normal. IMPRESSION: Mild enlargement of the cardiac silhouette. No pulmonary vascular congestion, pleural effusion, or pneumonia. Thoracic aortic atherosclerosis. Electronically Signed   By: David  [place] M.D.   On: 04/17/2017 10:46     Patient Profile     71 year old male from [place] ( patient does not speak English; help with interpretation by patient's family) with past medical history of coronary artery disease status post coronary artery bypass graft, myocardial infarction, diabetes,  hypertension, prior CVA with complaints of dyspnea. No previous records available.  Laboratories show BNP 618, hemoglobin 13.9 with MCV 70.8. Chest x-ray with no edema.  Assessment & Plan    1 dyspnea-etiology unclear. Mild improvement today. We'll continue Lasix 20 mg daily at DC. D-dimer minimally elevated. Check CTA to exclude pulmonary embolus. Await results of echocardiogram. Proceed with Lexiscan nuclear study.   2 hypertension-blood pressure is controlled.  3 coronary artery disease-continue aspirin and statin. Will need lipids and liver in 4 weeks.  4 diabetes mellitus-hold Glucophage; Follow CBGs.  5 Microcytosis-check stool Hemoccults. Follow up primary care after discharge. May need GI evaluation. There are targets noted and this may suggest thalassemia.  Signed, Olga Millers, MD  04/18/2017, 11:00 AM

## 2017-04-18 NOTE — Progress Notes (Signed)
Paged on call service to receive order that it is ok for family member to translate for patient's stress test.

## 2017-04-18 NOTE — Progress Notes (Signed)
  Echocardiogram 2D Echocardiogram has been performed.  Imelda Dandridge L Androw 04/18/2017, 8:40 AM

## 2017-04-18 NOTE — Care Management Note (Signed)
Case Management Note  Patient Details  Name: [name] MRN: [id number] Date of Birth: 1945/11/14  Subjective/Objective:   CHF                Action/Plan: CM talked to patient and his daughter at the bedside. Patient speaks only [place] and has been [place] in the [place] since [place], not a permanent resident and has not applied for Citizenship. Patient has no PCP, no medical insurance and would not qualify for any assistance- you have to be a Citizen on the [place] to qualify for programs that offer assistance. Daughter stated that he will think about applying for residency; He is [facility]; Financial Counselor was also in to see the patient.  Expected Discharge Date:   possibly 04/22/2017               Expected Discharge Plan:  Home/Self Care  In-House Referral:  Financial Counselor  Discharge planning Services  CM Consult  Status of Service:  In process, will continue to follow  Reola MosherChandler, Kollen Armenti L, RN,MHA,BSN [phone] 04/18/2017, 10:42 AM

## 2017-04-18 NOTE — Progress Notes (Signed)
    Patient presented for Lexiscan nuclear stress test. Tolerated procedure well. Interpreter was accessed for the test. Pending final stress imaging result.  Berton BonJanine Nairi Oswald, AGNP-C 04/18/2017  1:07 PM Pager: [phone]

## 2017-04-18 NOTE — Progress Notes (Signed)
Initial Nutrition Assessment  DOCUMENTATION CODES:   Not applicable  INTERVENTION:   Continue Ensure Enlive po BID, each supplement provides 350 kcal and 20 grams of protein    NUTRITION DIAGNOSIS:   Inadequate oral intake related to poor appetite as evidenced by per patient/family report, meal completion < 25%.  GOAL:   Patient will meet greater than or equal to 90% of their needs  MONITOR:   PO intake, Supplement acceptance, Labs, Weight trends  REASON FOR ASSESSMENT:   Malnutrition Screening Tool    ASSESSMENT:   71 yo male admitted with chest pain, SOB with CHF; hx of CAD/CABG, MI, DM, HTN, stroke   Recorded po intake 0%, appetite poor at present  Unsure if pt has experienced any changes in weight at this time  Unable to complete Nutrition-Focused physical exam at this time.   Labs: sodium 133, Creatinine wdl Meds: reviewed  Diet Order:  Diet heart healthy/carb modified Room service appropriate? Yes; Fluid consistency: Thin  Skin:  Reviewed, no issues  Last BM:  8/30  Height:   Ht Readings from Last 1 Encounters:  04/17/17 6' (1.829 m)    Weight:   Wt Readings from Last 1 Encounters:  04/18/17 184 lb 8.4 oz (83.7 kg)    Ideal Body Weight:     BMI:  Body mass index is 25.03 kg/m.  Estimated Nutritional Needs:   Kcal:  1900-2200 kcals  Protein:  95-110 g  Fluid:  >/= 1.9 L  EDUCATION NEEDS:   No education needs identified at this time  Romelle StarcherCate Larisha Vencill MS, RD, LDN [phone] Pager  [phone] Weekend/On-Call Pager

## 2017-04-19 DIAGNOSIS — I5023 Acute on chronic systolic (congestive) heart failure: Secondary | ICD-10-CM

## 2017-04-19 LAB — GLUCOSE, CAPILLARY
GLUCOSE-CAPILLARY: 169 mg/dL — AB (ref 65–99)
GLUCOSE-CAPILLARY: 248 mg/dL — AB (ref 65–99)
Glucose-Capillary: 193 mg/dL — ABNORMAL HIGH (ref 65–99)
Glucose-Capillary: 286 mg/dL — ABNORMAL HIGH (ref 65–99)

## 2017-04-19 LAB — LIPID PANEL
CHOL/HDL RATIO: 4 ratio
CHOLESTEROL: 158 mg/dL (ref 0–200)
HDL: 40 mg/dL — ABNORMAL LOW (ref >40)
LDL Cholesterol: 93 mg/dL (ref 0–99)
Triglycerides: 126 mg/dL (ref <150)
VLDL: 25 mg/dL (ref 0–40)

## 2017-04-19 LAB — BASIC METABOLIC PANEL
Anion gap: 10 (ref 5–15)
BUN: 19 mg/dL (ref 6–20)
CO2: 26 mmol/L (ref 22–32)
CREATININE: 1.05 mg/dL (ref 0.61–1.24)
Calcium: 9.6 mg/dL (ref 8.9–10.3)
Chloride: 98 mmol/L — ABNORMAL LOW (ref 101–111)
Glucose, Bld: 170 mg/dL — ABNORMAL HIGH (ref 65–99)
POTASSIUM: 4.5 mmol/L (ref 3.5–5.1)
SODIUM: 134 mmol/L — AB (ref 135–145)

## 2017-04-19 LAB — URIC ACID: Uric Acid, Serum: 7.7 mg/dL — ABNORMAL HIGH (ref 4.4–7.6)

## 2017-04-19 MED ORDER — FUROSEMIDE 20 MG PO TABS
20.0000 mg | ORAL_TABLET | Freq: Every day | ORAL | Status: DC
  Administered 2017-04-19 – 2017-04-20 (×2): 20 mg via ORAL
  Filled 2017-04-19 (×2): qty 1

## 2017-04-19 MED ORDER — OXYCODONE-ACETAMINOPHEN 5-325 MG PO TABS
1.0000 | ORAL_TABLET | Freq: Four times a day (QID) | ORAL | Status: DC | PRN
  Administered 2017-04-19 – 2017-04-20 (×2): 1 via ORAL
  Filled 2017-04-19 (×3): qty 1

## 2017-04-19 MED ORDER — LOSARTAN POTASSIUM 25 MG PO TABS
25.0000 mg | ORAL_TABLET | Freq: Every day | ORAL | Status: DC
  Administered 2017-04-19 – 2017-04-20 (×2): 25 mg via ORAL
  Filled 2017-04-19 (×2): qty 1

## 2017-04-19 NOTE — Progress Notes (Signed)
Patient without complaint on 7 a to 7 p shift other than some discomfort in right 3rd digit.  Notified MD of pain, order received.  VSS, continues with mild shortness of breath on exertion, denies chest pain.  Daughter and son in law at  bedside.

## 2017-04-19 NOTE — Progress Notes (Signed)
Progress Note  Patient Name: [name] Date of Encounter: 04/19/2017  Primary Cardiologist: Dr Jens Somrenshaw  Subjective   Describes Doe; No chest pain  Inpatient Medications    Scheduled Meds: . aspirin EC  81 mg Oral Daily  . atorvastatin  80 mg Oral q1800  . feeding supplement (ENSURE ENLIVE)  237 mL Oral BID BM  . heparin  5,000 Units Subcutaneous Q8H  . insulin aspart  0-5 Units Subcutaneous QHS  . insulin aspart  0-9 Units Subcutaneous TID WC  . pneumococcal 23 valent vaccine  0.5 mL Intramuscular Tomorrow-1000  . sodium chloride flush  3 mL Intravenous Q12H   Continuous Infusions: . sodium chloride     PRN Meds: sodium chloride, acetaminophen, ondansetron (ZOFRAN) IV, sodium chloride flush   Vital Signs    Vitals:   04/18/17 2200 04/19/17 0119 04/19/17 0500 04/19/17 0811  BP: 100/64 113/83 110/62 103/67  Pulse: 84 85 86 87  Resp: 18 20 20 20   Temp: (!) 97.4 F (36.3 C) 98.6 F (37 C) 98.5 F (36.9 C) 98.1 F (36.7 C)  TempSrc: Oral Oral Oral Oral  SpO2: 96% 98% 97% 99%  Weight:   84.1 kg (185 lb 8 oz)   Height:        Intake/Output Summary (Last 24 hours) at 04/19/17 0843 Last data filed at 04/19/17 0658  Gross per 24 hour  Intake              480 ml  Output             1250 ml  Net             -770 ml   Filed Weights   04/17/17 1552 04/18/17 0422 04/19/17 0500  Weight: 83.2 kg (183 lb 7 oz) 83.7 kg (184 lb 8.4 oz) 84.1 kg (185 lb 8 oz)    Telemetry    Sinus- Personally Reviewed    Physical Exam   GEN: WD/WN No acute distress.   Neck: No JVD, supple Cardiac: RRR Respiratory: Clear to auscultation bilaterally. No wheeze GI: Soft, nontender, non-distended, no masses MS: No edema Neuro:  grossly intact   Labs    Chemistry  Recent Labs Lab 04/17/17 1050 04/17/17 1106 04/17/17 1656 04/18/17 0356 04/19/17 0644  NA 133* 134*  --  133* 134*  K 3.8 3.8  --  3.8 4.5  CL 98* 97*  --  99* 98*  CO2 24  --   --  23 26  GLUCOSE 217*  215*  --  155* 170*  BUN 27* 28*  --  28* 19  CREATININE 1.22 1.00 1.15 1.20 1.05  CALCIUM 9.3  --   --  9.3 9.6  PROT 6.5  --   --   --   --   ALBUMIN 3.5  --   --   --   --   AST 31  --   --   --   --   ALT 34  --   --   --   --   ALKPHOS 71  --   --   --   --   BILITOT 0.7  --   --   --   --   GFRNONAA 58*  --  >60 60* >60  GFRAA >60  --  >60 >60 >60  ANIONGAP 11  --   --  11 10     Hematology  Recent Labs Lab 04/17/17 1050 04/17/17 1106 04/17/17 1656  WBC 8.0  --  8.7  RBC 5.41  --  5.60  HGB 13.9 14.3 14.4  HCT 38.3* 42.0 39.8  MCV 70.8*  --  71.1*  MCH 25.7*  --  25.7*  MCHC 36.3*  --  36.2*  RDW 14.0  --  13.8  PLT 239  --  241    Cardiac Enzymes  Recent Labs Lab 04/17/17 1656 04/17/17 2205 04/18/17 0356  TROPONINI <0.03 <0.03 <0.03     Recent Labs Lab 04/17/17 1104  TROPIPOC 0.06     BNP  Recent Labs Lab 04/17/17 1050  BNP 618.6*     DDimer   Recent Labs Lab 04/17/17 1656  DDIMER 0.77*     Radiology    Ct Angio Chest Pe W Or Wo Contrast  Result Date: 04/18/2017 CLINICAL DATA:  Shortness of breath for 2-3 weeks EXAM: CT ANGIOGRAPHY CHEST WITH CONTRAST TECHNIQUE: Multidetector CT imaging of the chest was performed using the standard protocol during bolus administration of intravenous contrast. Multiplanar CT image reconstructions and MIPs were obtained to evaluate the vascular anatomy. CONTRAST:  100 mL Isovue 370 COMPARISON:  None. FINDINGS: Cardiovascular: Satisfactory opacification of the pulmonary arteries to the segmental level. No evidence of pulmonary embolism. Mild cardiomegaly. Prior CABG. Thoracic aortic atherosclerosis. No pericardial effusion. Ascending thoracic aortic aneurysm measuring 4.6 cm in transverse diameter at the level of the right main pulmonary artery. Mediastinum/Nodes: No enlarged mediastinal, hilar, or axillary lymph nodes. Thyroid gland, trachea, and esophagus demonstrate no significant findings. Lungs/Pleura:  Hazy left lower lobe airspace disease in the infrahilar region. No focal consolidation. No pleural effusion or pneumothorax. Upper Abdomen: No acute abnormality. Musculoskeletal: No chest wall abnormality. No acute or significant osseous findings. Review of the MIP images confirms the above findings. IMPRESSION: 1. No evidence pulmonary embolus. 2. Hazy left lower lobe airspace disease in the infrahilar region which may reflect atelectasis versus pneumonia. 3. Ascending thoracic aortic aneurysm measuring 4.6 cm. Recommend semi-annual imaging followup by CTA or MRA and referral to cardiothoracic surgery if not already obtained. This recommendation follows 2010 ACCF/AHA/AATS/ACR/ASA/SCA/SCAI/SIR/STS/SVM Guidelines for the Diagnosis and Management of Patients With Thoracic Aortic Disease. Circulation. 2010; 121: e266-e36 Electronically Signed   By: Elige Ko   On: 04/18/2017 14:47   Nm Myocar Multi W/spect W/wall Motion / Ef  Result Date: 04/18/2017  There was no ST segment deviation noted during stress.  Defect 1: There is a medium defect of severe severity present in the basal inferolateral, mid inferior, mid inferolateral and apical inferior location.  Findings consistent with prior myocardial infarction.  This is a high risk study due to reduced systolic function.  The left ventricular ejection fraction is severely decreased (<30%).  No ischemia.    Dg Chest Port 1 View  Result Date: 04/17/2017 CLINICAL DATA:  Chest pain and shortness of breath for the past 3 days. History of diabetes, previous pulmonary embolism, nonsmoker. EXAM: PORTABLE CHEST 1 VIEW COMPARISON:  None in PACs FINDINGS: The lungs are adequately inflated and clear. There is no pleural effusion or pneumothorax. The cardiac silhouette is enlarged. The patient has undergone previous median sternotomy. There is calcification in the wall of the aortic arch. The pulmonary vascularity is normal. IMPRESSION: Mild enlargement of the cardiac  silhouette. No pulmonary vascular congestion, pleural effusion, or pneumonia. Thoracic aortic atherosclerosis. Electronically Signed   By: David  [place] M.D.   On: 04/17/2017 10:46     Patient Profile     71 year old male from [place] ( patient  does not speak English; help with interpretation by patient's family) with past medical history of coronary artery disease status post coronary artery bypass graft, myocardial infarction, diabetes, hypertension, prior CVA with complaints of dyspnea. No previous records available.  Laboratories show BNP 618, hemoglobin 13.9 with MCV 70.8. Chest x-ray with no edema. Echocardiogram shows severely reduced LV systolic function with ejection fraction 20-25%, mild aortic and mitral regurgitation. Nuclear study shows ejection fraction less than 30%. There is a prior inferior and apical infarct but no ischemia. Chest CT shows no pulmonary embolus. There is an aortic aneurysm measuring 4.6 cm.   Assessment & Plan    1 dyspnea/ICM-As above patient appears to have an ischemic cardiomyopathy although we do not have prior records available. He was told that he had a weak heart muscle in the past. Nuclear study shows infarct but no ischemia. I would like to treat medically. Continue aspirin and statin. Add Lasix 20 mg daily. Add Cozaar 25 mg daily. Add beta blocker later if blood pressure allows. If symptoms persist despite medications he may require right and left catheterization. Would need to repeat echocardiogram in 3 months after medications titrated and then consider ICD if ejection fraction less than 35%. No pulmonary embolus on CTA.  2 hypertension-blood pressure is controlled.  3 coronary artery disease-continue aspirin and statin. Will need lipids and liver in 4 weeks.  4 diabetes mellitus-hold Glucophage; Follow CBGs.  5 Microcytosis-check stool Hemoccults. Follow up primary care after discharge. May need GI evaluation. There are targets noted and this may  suggest thalassemia.  6 TAA-Needs fu CTA 6 months.  Signed, Olga Millers, MD  04/19/2017, 8:43 AM

## 2017-04-20 ENCOUNTER — Other Ambulatory Visit: Payer: Self-pay | Admitting: Adult Health

## 2017-04-20 DIAGNOSIS — I5043 Acute on chronic combined systolic (congestive) and diastolic (congestive) heart failure: Secondary | ICD-10-CM

## 2017-04-20 LAB — GLUCOSE, CAPILLARY
GLUCOSE-CAPILLARY: 185 mg/dL — AB (ref 65–99)
Glucose-Capillary: 201 mg/dL — ABNORMAL HIGH (ref 65–99)

## 2017-04-20 LAB — BASIC METABOLIC PANEL
ANION GAP: 9 (ref 5–15)
BUN: 20 mg/dL (ref 6–20)
CO2: 26 mmol/L (ref 22–32)
Calcium: 9.3 mg/dL (ref 8.9–10.3)
Chloride: 99 mmol/L — ABNORMAL LOW (ref 101–111)
Creatinine, Ser: 1.11 mg/dL (ref 0.61–1.24)
GFR calc Af Amer: >60 mL/min (ref >60)
GLUCOSE: 171 mg/dL — AB (ref 65–99)
POTASSIUM: 4.2 mmol/L (ref 3.5–5.1)
SODIUM: 134 mmol/L — AB (ref 135–145)

## 2017-04-20 MED ORDER — CARVEDILOL 3.125 MG PO TABS: 3.1250 mg | ORAL_TABLET | Freq: Two times a day (BID) | ORAL | 11 refills | Status: DC

## 2017-04-20 MED ORDER — ATORVASTATIN CALCIUM 80 MG PO TABS: 80.0000 mg | ORAL_TABLET | Freq: Every day | ORAL | 11 refills | Status: DC

## 2017-04-20 MED ORDER — ENSURE ENLIVE PO LIQD: 237.0000 mL | Freq: Two times a day (BID) | ORAL | 12 refills | Status: DC

## 2017-04-20 MED ORDER — CARVEDILOL 3.125 MG PO TABS: 3.1250 mg | ORAL_TABLET | Freq: Two times a day (BID) | ORAL | Status: DC

## 2017-04-20 MED ORDER — FUROSEMIDE 20 MG PO TABS: 20.0000 mg | ORAL_TABLET | Freq: Every day | ORAL | 6 refills | Status: DC

## 2017-04-20 MED ORDER — LOSARTAN POTASSIUM 25 MG PO TABS: 25.0000 mg | ORAL_TABLET | Freq: Every day | ORAL | 11 refills | Status: DC

## 2017-04-20 NOTE — Progress Notes (Addendum)
Progress Note  Patient Name: [name] Date of Encounter: 04/20/2017  Primary Cardiologist: Dr Jens Somrenshaw  Subjective   Dyspnea with some improvement this AM; no chest pain  Inpatient Medications    Scheduled Meds: . aspirin EC  81 mg Oral Daily  . atorvastatin  80 mg Oral q1800  . feeding supplement (ENSURE ENLIVE)  237 mL Oral BID BM  . furosemide  20 mg Oral Daily  . heparin  5,000 Units Subcutaneous Q8H  . insulin aspart  0-5 Units Subcutaneous QHS  . insulin aspart  0-9 Units Subcutaneous TID WC  . losartan  25 mg Oral Daily  . sodium chloride flush  3 mL Intravenous Q12H   Continuous Infusions: . sodium chloride     PRN Meds: sodium chloride, acetaminophen, ondansetron (ZOFRAN) IV, oxyCODONE-acetaminophen, sodium chloride flush   Vital Signs    Vitals:   04/19/17 2000 04/19/17 2103 04/20/17 0554 04/20/17 0819  BP: 117/65 117/65 101/71 116/84  Pulse: 88 78 79 85  Resp: 20 20 20    Temp: 98.1 F (36.7 C) 98.1 F (36.7 C) 98.1 F (36.7 C)   TempSrc: Oral Oral Oral   SpO2: 98% 98% 100% 98%  Weight:   83.5 kg (184 lb)   Height:        Intake/Output Summary (Last 24 hours) at 04/20/17 1052 Last data filed at 04/20/17 0600  Gross per 24 hour  Intake              200 ml  Output                1 ml  Net              199 ml   Filed Weights   04/18/17 0422 04/19/17 0500 04/20/17 0554  Weight: 83.7 kg (184 lb 8.4 oz) 84.1 kg (185 lb 8 oz) 83.5 kg (184 lb)    Telemetry    Sinus- Personally Reviewed    Physical Exam   GEN: WD/WN NAD Neck: supple Cardiac: RRR, no murmur Respiratory: Clear to auscultation bilaterally. No rhonchi GI: Soft, NT/ND MS: No edema; no deformity Neuro: no focal findings   Labs    Chemistry  Recent Labs Lab 04/17/17 1050  04/18/17 0356 04/19/17 0644 04/20/17 0605  NA 133*  < > 133* 134* 134*  K 3.8  < > 3.8 4.5 4.2  CL 98*  < > 99* 98* 99*  CO2 24  --  23 26 26   GLUCOSE 217*  < > 155* 170* 171*  BUN 27*  < >  28* 19 20  CREATININE 1.22  < > 1.20 1.05 1.11  CALCIUM 9.3  --  9.3 9.6 9.3  PROT 6.5  --   --   --   --   ALBUMIN 3.5  --   --   --   --   AST 31  --   --   --   --   ALT 34  --   --   --   --   ALKPHOS 71  --   --   --   --   BILITOT 0.7  --   --   --   --   GFRNONAA 58*  < > 60* >60 >60  GFRAA >60  < > >60 >60 >60  ANIONGAP 11  --  11 10 9   < > = values in this interval not displayed.   Hematology  Recent Labs Lab 04/17/17 1050 04/17/17  1106 04/17/17 1656  WBC 8.0  --  8.7  RBC 5.41  --  5.60  HGB 13.9 14.3 14.4  HCT 38.3* 42.0 39.8  MCV 70.8*  --  71.1*  MCH 25.7*  --  25.7*  MCHC 36.3*  --  36.2*  RDW 14.0  --  13.8  PLT 239  --  241    Cardiac Enzymes  Recent Labs Lab 04/17/17 1656 04/17/17 2205 04/18/17 0356  TROPONINI <0.03 <0.03 <0.03     Recent Labs Lab 04/17/17 1104  TROPIPOC 0.06     BNP  Recent Labs Lab 04/17/17 1050  BNP 618.6*     DDimer   Recent Labs Lab 04/17/17 1656  DDIMER 0.77*     Radiology    Ct Angio Chest Pe W Or Wo Contrast  Result Date: 04/18/2017 CLINICAL DATA:  Shortness of breath for 2-3 weeks EXAM: CT ANGIOGRAPHY CHEST WITH CONTRAST TECHNIQUE: Multidetector CT imaging of the chest was performed using the standard protocol during bolus administration of intravenous contrast. Multiplanar CT image reconstructions and MIPs were obtained to evaluate the vascular anatomy. CONTRAST:  100 mL Isovue 370 COMPARISON:  None. FINDINGS: Cardiovascular: Satisfactory opacification of the pulmonary arteries to the segmental level. No evidence of pulmonary embolism. Mild cardiomegaly. Prior CABG. Thoracic aortic atherosclerosis. No pericardial effusion. Ascending thoracic aortic aneurysm measuring 4.6 cm in transverse diameter at the level of the right main pulmonary artery. Mediastinum/Nodes: No enlarged mediastinal, hilar, or axillary lymph nodes. Thyroid gland, trachea, and esophagus demonstrate no significant findings. Lungs/Pleura:  Hazy left lower lobe airspace disease in the infrahilar region. No focal consolidation. No pleural effusion or pneumothorax. Upper Abdomen: No acute abnormality. Musculoskeletal: No chest wall abnormality. No acute or significant osseous findings. Review of the MIP images confirms the above findings. IMPRESSION: 1. No evidence pulmonary embolus. 2. Hazy left lower lobe airspace disease in the infrahilar region which may reflect atelectasis versus pneumonia. 3. Ascending thoracic aortic aneurysm measuring 4.6 cm. Recommend semi-annual imaging followup by CTA or MRA and referral to cardiothoracic surgery if not already obtained. This recommendation follows 2010 ACCF/AHA/AATS/ACR/ASA/SCA/SCAI/SIR/STS/SVM Guidelines for the Diagnosis and Management of Patients With Thoracic Aortic Disease. Circulation. 2010; 121: e266-e36 Electronically Signed   By: Elige Ko   On: 04/18/2017 14:47   Nm Myocar Multi W/spect W/wall Motion / Ef  Result Date: 04/18/2017  There was no ST segment deviation noted during stress.  Defect 1: There is a medium defect of severe severity present in the basal inferolateral, mid inferior, mid inferolateral and apical inferior location.  Findings consistent with prior myocardial infarction.  This is a high risk study due to reduced systolic function.  The left ventricular ejection fraction is severely decreased (<30%).  No ischemia.      Patient Profile     71 year old male from [place] ( patient does not speak English; help with interpretation by patient's family) with past medical history of coronary artery disease status post coronary artery bypass graft, myocardial infarction, diabetes, hypertension, prior CVA with complaints of dyspnea. No previous records available.  Laboratories show BNP 618, hemoglobin 13.9 with MCV 70.8. Chest x-ray with no edema. Echocardiogram shows severely reduced LV systolic function with ejection fraction 20-25%, mild aortic and mitral regurgitation.  Nuclear study shows ejection fraction less than 30%. There is a prior inferior and apical infarct but no ischemia. Chest CT shows no pulmonary embolus. There is an aortic aneurysm measuring 4.6 cm.   Assessment & Plan    1 dyspnea/ICM-As  above patient appears to have an ischemic cardiomyopathy although we do not have prior records available. He was told that he had a weak heart muscle in the past. Nuclear study shows infarct but no ischemia. We will plan to treat medically. Continue aspirin and statin. Continue lasix 20 mg daily. Continue Cozaar 25 mg daily. Add coreg 3.125 mg BID. Titrate meds as outpt. If symptoms persist despite medications he may require right and left catheterization. Would need to repeat echocardiogram in 3 months after medications titrated and then consider ICD if ejection fraction less than 35%.  2 hypertension-blood pressure is controlled. Continue present meds.  3 coronary artery disease-continue aspirin and statin. Statin started this admission. Will need lipids and liver in 4 weeks.  4 diabetes mellitus-resume Glucophage; pt needs to establish with primary care following DC.  5 Microcytosis-Follow up primary care after discharge. May need GI evaluation. There are targets noted and this may suggest thalassemia.  6 TAA-Needs fu CTA 6 months.  Discharge today. Transition of care appointment with APP one week. Check BMET at that time. Follow-up with me in 3 months. Greater than 30 minutes PA and physician time. D2  Signed, Olga Millers, MD  04/20/2017, 10:52 AM

## 2017-04-20 NOTE — Progress Notes (Signed)
Discharge instructions reviewed with patient and his family, questions answered, verbalized understanding.  Patient transported to main entrance of the hospital to be taken home by daughter and son in law.

## 2017-04-20 NOTE — Discharge Summary (Signed)
Discharge Summary    Patient ID: [name],  MRN: [id number], DOB/AGE: Jan 17, 1946 71 y.o.  Admit date: 04/17/2017 Discharge date: 04/20/2017  Primary Care Provider: Patient, No Pcp Per Primary Cardiologist: Crenshaw   Discharge Diagnoses    Active Problems:   CHF (congestive heart failure) ([name])   Allergies No Known Allergies  Diagnostic Studies/Procedures   Echocardiogram 04/18/2017 Left ventricle: The cavity size was moderately dilated. Wall   thickness was normal. Systolic function was severely reduced. The   estimated ejection fraction was in the range of 20% to 25%. - Aortic valve: There was mild regurgitation. - Mitral valve: There was mild regurgitation.  NM Study 04/18/2017  _There was no ST segment deviation noted during stress.  Defect 1: There is a medium defect of severe severity present in the basal inferolateral, mid inferior, mid inferolateral and apical inferior location.  Findings consistent with prior myocardial infarction.  This is a high risk study due to reduced systolic function.  The left ventricular ejection fraction is severely decreased (<30%). No ischemia.____________   History of Present Illness    71 year old male patient who does not speak English,from [place], with known history of CAD, status post CABG, MI, diabetes, prior CVA, hypertension, admitted with complaints of dyspnea. EF of 20-25%.   Hospital Course     The patient was seen initially by Dr. Olga Millers in the emergency room, was diagnosed with acute on chronic systolic heart failure, admitted for diuresis. Due to lengthy. It traveled from his country, a d-dimer was checked to rule out PE in the setting of worsening dyspnea. Cardiac enzymes were cycled, echocardiogram was ordered. NM study was also completed.   He was ruled out for pulmonary emboli. Echocardiogram was completed revealing significantly reduced EF of 20-25%. Lexiscan Myoview was completed which revealed no  areas of reversible ischemia but was found to be high risk in the setting of reduced LV function. The patient was placed on aspirin and statin therapy. Troponin was found to be negative 4.  Once diagnostic testing was completed the patient was started on Lasix 20 mg daily, Cozaar 25 mg daily, with discussion to add carvedilol if blood pressure allowed. The patient's blood pressure was very well controlled throughout hospitalization, blood glucose was elevated he was started on sliding scale insulin and Glucophage was held. He was noted have microcytosis, and Hemoccult stools were completed. No results are documented.  The patient was diuresed 1521 cc during hospitalization. Weights for not reflective of diuresis, as admission weight was recorded at 180 pounds, with discharge weight 184 pounds.   The patient was seen and examined by Dr. Jens Som on day of discharge, with addition of carvedilol 3.125 mg twice a day. Glucophage was restarted. It was recommended that he follow-up with a primary care physician and be established there for ongoing management, he currently does not have a PCP. Due to microcytosis, he may need to be referred to GI but this is deferred to evaluation by primary care and was not planned on discharge.   He was recommended the patient have a repeat echocardiogram in 3 months after medication titration to optimal dosages. If EF remains less than 35% discussion for need for ICD would be completed at that time.   The patient will have a TOC follow-up in one week with advanced practice provider in the [facility]  office. He was then follow-up with Dr. Jens Som in 3 months. It is recommended that prior to that three-month office visit the  patient had echocardiogram ordered. A BMET is to be completed on day of follow-up appointment in one week. This has been ordered.  __ ______  Discharge Vitals Blood pressure 114/79, pulse 86, temperature 98.1 F (36.7 C), temperature source Oral,  resp. rate 20, height 6' (1.829 m), weight 184 lb (83.5 kg), SpO2 99 %.  Filed Weights   04/18/17 0422 04/19/17 0500 04/20/17 0554  Weight: 184 lb 8.4 oz (83.7 kg) 185 lb 8 oz (84.1 kg) 184 lb (83.5 kg)    Labs & Radiologic Studies     CBC  Recent Labs  04/17/17 1656  WBC 8.7  HGB 14.4  HCT 39.8  MCV 71.1*  PLT 241   Basic Metabolic Panel  Recent Labs  04/19/17 0644 04/20/17 0605  NA 134* 134*  K 4.5 4.2  CL 98* 99*  CO2 26 26  GLUCOSE 170* 171*  BUN 19 20  CREATININE 1.05 1.11  CALCIUM 9.6 9.3   Cardiac Enzymes  Recent Labs  04/17/17 1656 04/17/17 2205 04/18/17 0356  TROPONINI <0.03 <0.03 <0.03   BNP Invalid input(s): POCBNP D-Dimer  Recent Labs  04/17/17 1656  DDIMER 0.77*  Fasting Lipid Panel  Recent Labs  04/19/17 0644  CHOL 158  HDL 40*  LDLCALC 93  TRIG 161  CHOLHDL 4.0   Ct Angio Chest Pe W Or Wo Contrast  Result Date: 04/18/2017 CLINICAL DATA:  Shortness of breath for 2-3 weeks EXAM: CT ANGIOGRAPHY CHEST WITH CONTRAST TECHNIQUE: Multidetector CT imaging of the chest was performed using the standard protocol during bolus administration of intravenous contrast. Multiplanar CT image reconstructions and MIPs were obtained to evaluate the vascular anatomy. CONTRAST:  100 mL Isovue 370 COMPARISON:  None. FINDINGS: Cardiovascular: Satisfactory opacification of the pulmonary arteries to the segmental level. No evidence of pulmonary embolism. Mild cardiomegaly. Prior CABG. Thoracic aortic atherosclerosis. No pericardial effusion. Ascending thoracic aortic aneurysm measuring 4.6 cm in transverse diameter at the level of the right main pulmonary artery. Mediastinum/Nodes: No enlarged mediastinal, hilar, or axillary lymph nodes. Thyroid gland, trachea, and esophagus demonstrate no significant findings. Lungs/Pleura: Hazy left lower lobe airspace disease in the infrahilar region. No focal consolidation. No pleural effusion or pneumothorax. Upper Abdomen: No  acute abnormality. Musculoskeletal: No chest wall abnormality. No acute or significant osseous findings. Review of the MIP images confirms the above findings. IMPRESSION: 1. No evidence pulmonary embolus. 2. Hazy left lower lobe airspace disease in the infrahilar region which may reflect atelectasis versus pneumonia. 3. Ascending thoracic aortic aneurysm measuring 4.6 cm. Recommend semi-annual imaging followup by CTA or MRA and referral to cardiothoracic surgery if not already obtained. This recommendation follows 2010 ACCF/AHA/AATS/ACR/ASA/SCA/SCAI/SIR/STS/SVM Guidelines for the Diagnosis and Management of Patients With Thoracic Aortic Disease. Circulation. 2010; 121: e266-e36 Electronically Signed   By: Elige Ko   On: 04/18/2017 14:47   Nm Myocar Multi W/spect W/wall Motion / Ef  Result Date: 04/18/2017  There was no ST segment deviation noted during stress.  Defect 1: There is a medium defect of severe severity present in the basal inferolateral, mid inferior, mid inferolateral and apical inferior location.  Findings consistent with prior myocardial infarction.  This is a high risk study due to reduced systolic function.  The left ventricular ejection fraction is severely decreased (<30%).  No ischemia.    Dg Chest Port 1 View  Result Date: 04/17/2017 CLINICAL DATA:  Chest pain and shortness of breath for the past 3 days. History of diabetes, previous pulmonary embolism, nonsmoker. EXAM: PORTABLE  CHEST 1 VIEW COMPARISON:  None in PACs FINDINGS: The lungs are adequately inflated and clear. There is no pleural effusion or pneumothorax. The cardiac silhouette is enlarged. The patient has undergone previous median sternotomy. There is calcification in the wall of the aortic arch. The pulmonary vascularity is normal. IMPRESSION: Mild enlargement of the cardiac silhouette. No pulmonary vascular congestion, pleural effusion, or pneumonia. Thoracic aortic atherosclerosis. Electronically Signed   By:  David  [place] M.D.   On: 04/17/2017 10:46    Disposition   Pt is being discharged home today in good condition.  Follow-up Plans & Appointments    Follow-up Information    Ellsworth LennoxStrader, Brittany M, PA-C Follow up in 1 week(s).   Specialties:  Physician Assistant, Cardiology Why:  Our office will call you for appointment  Contact information: [address] [place] [address] [phone]        Lewayne Buntingrenshaw, Brian S, MD Follow up in 3 month(s).   Specialty:  Cardiology Contact information: [address] [place] [address] [phone]        Delight OvensGerhardt, Edward B, MD Follow up.   Specialty:  Cardiothoracic Surgery Why:  Please call for appointment.  Contact information: [address] [place] [address] [phone]          Discharge Instructions    Diet - low sodium heart healthy    Complete by:  As directed    Increase activity slowly    Complete by:  As directed       Discharge Medications   Current Discharge Medication List    START taking these medications   Details  atorvastatin (LIPITOR) 80 MG tablet Take 1 tablet (80 mg total) by mouth daily at 6 PM. Qty: 30 tablet, Refills: 11    carvedilol (COREG) 3.125 MG tablet Take 1 tablet (3.125 mg total) by mouth 2 (two) times daily with a meal. Qty: 60 tablet, Refills: 11    feeding supplement, ENSURE ENLIVE, (ENSURE ENLIVE) LIQD Take 237 mLs by mouth 2 (two) times daily between meals. Qty: 237 mL, Refills: 12    furosemide (LASIX) 20 MG tablet Take 1 tablet (20 mg total) by mouth daily. Qty: 30 tablet, Refills: 6    losartan (COZAAR) 25 MG tablet Take 1 tablet (25 mg total) by mouth daily. Qty: 30 tablet, Refills: 11      CONTINUE these medications which have NOT CHANGED   Details  aspirin EC 81 MG tablet Take 81 mg by mouth daily.    metFORMIN (GLUCOPHAGE) 1000 MG tablet Take 1,000 mg by mouth daily with breakfast.          Outstanding Labs/Studies    BMET in one week.   Duration of Discharge Encounter   Greater than 30 minutes including physician time.  Signed, Bettey MareKathryn M. Liborio NixonLawrence DNP, ANP, AACC   04/20/2017, 12:09 PM

## 2017-04-22 ENCOUNTER — Ambulatory Visit (HOSPITAL_COMMUNITY)
Admission: EM | Admit: 2017-04-22 | Discharge: 2017-04-22 | Disposition: A | Discharge status: Home / Self Care | Payer: Self-pay

## 2017-04-22 ENCOUNTER — Emergency Department (HOSPITAL_COMMUNITY): Payer: Self-pay

## 2017-04-22 ENCOUNTER — Emergency Department (HOSPITAL_COMMUNITY)
Admission: EM | Admit: 2017-04-22 | Discharge: 2017-04-22 | Disposition: A | Discharge status: Home / Self Care | Payer: Self-pay | Attending: Emergency Medicine | Admitting: Emergency Medicine

## 2017-04-22 ENCOUNTER — Encounter (HOSPITAL_COMMUNITY): Payer: Self-pay | Admitting: Emergency Medicine

## 2017-04-22 DIAGNOSIS — S92511A Displaced fracture of proximal phalanx of right lesser toe(s), initial encounter for closed fracture: Secondary | ICD-10-CM | POA: Insufficient documentation

## 2017-04-22 DIAGNOSIS — R739 Hyperglycemia, unspecified: Secondary | ICD-10-CM

## 2017-04-22 DIAGNOSIS — E1165 Type 2 diabetes mellitus with hyperglycemia: Secondary | ICD-10-CM | POA: Insufficient documentation

## 2017-04-22 DIAGNOSIS — Z8673 Personal history of transient ischemic attack (TIA), and cerebral infarction without residual deficits: Secondary | ICD-10-CM | POA: Insufficient documentation

## 2017-04-22 DIAGNOSIS — Y999 Unspecified external cause status: Secondary | ICD-10-CM | POA: Insufficient documentation

## 2017-04-22 DIAGNOSIS — I110 Hypertensive heart disease with heart failure: Secondary | ICD-10-CM | POA: Insufficient documentation

## 2017-04-22 DIAGNOSIS — Y939 Activity, unspecified: Secondary | ICD-10-CM | POA: Insufficient documentation

## 2017-04-22 DIAGNOSIS — Y929 Unspecified place or not applicable: Secondary | ICD-10-CM | POA: Insufficient documentation

## 2017-04-22 DIAGNOSIS — I509 Heart failure, unspecified: Secondary | ICD-10-CM | POA: Insufficient documentation

## 2017-04-22 DIAGNOSIS — W228XXA Striking against or struck by other objects, initial encounter: Secondary | ICD-10-CM | POA: Insufficient documentation

## 2017-04-22 DIAGNOSIS — I2510 Atherosclerotic heart disease of native coronary artery without angina pectoris: Secondary | ICD-10-CM | POA: Insufficient documentation

## 2017-04-22 LAB — URINALYSIS, ROUTINE W REFLEX MICROSCOPIC
BILIRUBIN URINE: NEGATIVE
GLUCOSE, UA: NEGATIVE mg/dL
Hgb urine dipstick: NEGATIVE
KETONES UR: 5 mg/dL — AB
LEUKOCYTES UA: NEGATIVE
Nitrite: NEGATIVE
PH: 5 (ref 5.0–8.0)
Protein, ur: NEGATIVE mg/dL
Specific Gravity, Urine: 1.02 (ref 1.005–1.030)

## 2017-04-22 LAB — BASIC METABOLIC PANEL
Anion gap: 10 (ref 5–15)
BUN: 19 mg/dL (ref 6–20)
CHLORIDE: 99 mmol/L — AB (ref 101–111)
CO2: 20 mmol/L — ABNORMAL LOW (ref 22–32)
Calcium: 9.3 mg/dL (ref 8.9–10.3)
Creatinine, Ser: 1.11 mg/dL (ref 0.61–1.24)
GFR calc Af Amer: >60 mL/min (ref >60)
GFR calc non Af Amer: >60 mL/min (ref >60)
GLUCOSE: 140 mg/dL — AB (ref 65–99)
POTASSIUM: 4.7 mmol/L (ref 3.5–5.1)
Sodium: 129 mmol/L — ABNORMAL LOW (ref 135–145)

## 2017-04-22 LAB — CBC
HEMATOCRIT: 36.9 % — AB (ref 39.0–52.0)
Hemoglobin: 13.1 g/dL (ref 13.0–17.0)
MCH: 24.7 pg — ABNORMAL LOW (ref 26.0–34.0)
MCHC: 35.5 g/dL (ref 30.0–36.0)
MCV: 69.6 fL — AB (ref 78.0–100.0)
Platelets: 267 10*3/uL (ref 150–400)
RBC: 5.3 MIL/uL (ref 4.22–5.81)
RDW: 14.2 % (ref 11.5–15.5)
WBC: 7.9 10*3/uL (ref 4.0–10.5)

## 2017-04-22 LAB — CBG MONITORING, ED: Glucose-Capillary: 142 mg/dL — ABNORMAL HIGH (ref 65–99)

## 2017-04-22 LAB — HEMOGLOBIN A1C
Hgb A1c MFr Bld: 9.4 % — ABNORMAL HIGH (ref 4.8–5.6)
Mean Plasma Glucose: 223 mg/dL

## 2017-04-22 MED ORDER — MELOXICAM 7.5 MG PO TABS: 15.0000 mg | ORAL_TABLET | Freq: Every day | ORAL | 0 refills | Status: DC

## 2017-04-22 MED ORDER — TRAMADOL HCL 50 MG PO TABS
50.0000 mg | ORAL_TABLET | Freq: Once | ORAL | Status: AC
  Administered 2017-04-22: 50 mg via ORAL
  Filled 2017-04-22: qty 1

## 2017-04-22 MED ORDER — INSULIN GLARGINE 100 UNIT/ML ~~LOC~~ SOLN
5.0000 [IU] | Freq: Every day | SUBCUTANEOUS | 11 refills | Status: DC
Start: 2017-04-22 — End: 2017-05-12

## 2017-04-22 NOTE — Discharge Instructions (Signed)
You must make sure that you are eating at least 3 meals a day - take your medicines including Metformin and Insulin (5 units at bedtime).  You have a very small "chip" fracture of your toe - this will not need surgery - take Mobic twice daily as needed for pain and wear the shoe that we have given you.  This will not need surgery but may take weeks to heal.  ER for worsening symptoms including swelling, fevers, redness, pain, or weakness.

## 2017-04-22 NOTE — ED Triage Notes (Signed)
Pt was just recently discharged from hospital with CHF. Pt here today with increased swelling in bilateral lower legs. Denies CP/SOB. Pt states his CBG has been running high since he was discharged. At home his meter read 400

## 2017-04-22 NOTE — ED Notes (Signed)
Patient left at this time with all belongings. 

## 2017-04-22 NOTE — ED Notes (Signed)
Verified patient's medication - reports he used to take 14 units of insulin at night but is unable to report what type of insulin ie regular, lantus, etc. States "human insulin" but is otherwise unable to specify. Reports taking one metformin in the morning.

## 2017-04-22 NOTE — ED Provider Notes (Signed)
[facility]-EMERGENCY DEPT Provider Note   CSN: [id number] Arrival date & time: 04/22/17  1310     History   Chief Complaint Chief Complaint  Patient presents with  . Hyperglycemia  . Leg Swelling    HPI [name] is a 71 y.o. male.  HPI  The patient is a 71 year old male who was formerly dx with DM, Htn, HLD and Stro,e as well as having undergone a CABG in [place] in 2007 recently confirmed to have congestive heart failure with a very poor ejection fraction around 25%. He was admitted to the hospital and seen by cardiology, during his admission he had negative troponins, and echocardiogram showing a poor ejection fraction and a nuclear medicine stress test which showed that it was high risk based on his poor ejection fraction but no areas of reversible ischemia. He was given some diuresis and discharged on diuretic medication as well as insulin instead of taking metformin. This happened 2 days ago. Last night his blood sugar continued to creep higher until it was 400 despite taking his insulin doses, this morning it was 260 but because he was so nervous about his blood sugar going back up he has not had anything to eat today until his family forced him to eat some of the sandwich in the waiting room. He denies chest pain shortness of breath or swelling of the legs but does complain about swelling of the right foot after stubbing his toes 2 weeks ago. The pain is not gone away, it is persistent when he stands, better when he is supine and is associated with swelling over the dorsum of the foot as well as several of the toes. There is no bleeding, no drainage, no fevers.  Past Medical History:  Diagnosis Date  . CAD (coronary artery disease) 2007   CABG 2007 in [place]  . Diabetes mellitus without complication ([name])   . Hyperlipidemia   . Hypertension   . Pulmonary embolism ([name])   . Stroke [facility]([name])     Patient Active Problem List   Diagnosis Date Noted  . CHF (congestive heart failure)  ([name]) 04/17/2017    Past Surgical History:  Procedure Laterality Date  . PROSTATE SURGERY  2002     Home Medications    Prior to Admission medications   Medication Sig Start Date End Date Taking? Authorizing Provider  acetaminophen (TYLENOL) 650 MG CR tablet Take 650 mg by mouth at bedtime. FOR LEG PAIN   Yes [provider]  aspirin EC 325 MG tablet Take 325 mg by mouth daily.   Yes [provider]  atorvastatin (LIPITOR) 80 MG tablet Take 1 tablet (80 mg total) by mouth daily at 6 PM. 04/20/17  Yes Jodelle GrossLawrence, Kathryn M, NP  carvedilol (COREG) 3.125 MG tablet Take 1 tablet (3.125 mg total) by mouth 2 (two) times daily with a meal. 04/20/17  Yes Jodelle GrossLawrence, Kathryn M, NP  feeding supplement, GLUCERNA SHAKE, (GLUCERNA SHAKE) LIQD Take 237 mLs by mouth 2 (two) times daily between meals.   Yes [provider]  furosemide (LASIX) 20 MG tablet Take 1 tablet (20 mg total) by mouth daily. 04/21/17  Yes Jodelle GrossLawrence, Kathryn M, NP  losartan (COZAAR) 25 MG tablet Take 1 tablet (25 mg total) by mouth daily. 04/21/17  Yes Jodelle GrossLawrence, Kathryn M, NP  metFORMIN (GLUCOPHAGE) 1000 MG tablet Take 1,000 mg by mouth daily with breakfast.   Yes [provider]  feeding supplement, ENSURE ENLIVE, (ENSURE ENLIVE) LIQD Take 237 mLs by mouth 2 (  two) times daily between meals. Patient not taking: Reported on 04/22/2017 04/20/17   Jodelle Gross, NP  insulin glargine (LANTUS) 100 UNIT/ML injection Inject 0.05 mLs (5 Units total) into the skin at bedtime. 04/22/17 05/02/17  Eber Hong, MD  meloxicam (MOBIC) 7.5 MG tablet Take 2 tablets (15 mg total) by mouth daily. 04/22/17   Eber Hong, MD    Family History Family History  Problem Relation Age of Onset  . Hypertension Mother   . Hypertension Sister     Social History Social History  Substance Use Topics  . Smoking status: Never Smoker  . Smokeless tobacco: Never Used  . Alcohol use No     Allergies   Patient has no known  allergies.   Review of Systems Review of Systems  All other systems reviewed and are negative.    Physical Exam Updated Vital Signs BP (!) 131/96   Pulse 85   Temp 98.1 F (36.7 C) (Oral)   Resp (!) 24   Wt 83.5 kg (184 lb)   SpO2 98%   BMI 24.95 kg/m   Physical Exam  Constitutional: He appears well-developed and well-nourished. No distress.  HENT:  Head: Normocephalic and atraumatic.  Mouth/Throat: Oropharynx is clear and moist. No oropharyngeal exudate.  Eyes: Pupils are equal, round, and reactive to light. Conjunctivae and EOM are normal. Right eye exhibits no discharge. Left eye exhibits no discharge. No scleral icterus.  Neck: Normal range of motion. Neck supple. No JVD present. No thyromegaly present.  No JVD  Cardiovascular: Normal rate, regular rhythm, normal heart sounds and intact distal pulses.  Exam reveals no gallop and no friction rub.   No murmur heard. Prior bypass graft scar well-healed  Pulmonary/Chest: Effort normal and breath sounds normal. No respiratory distress. He has no wheezes. He has no rales.  No rales, no increased work of breathing  Abdominal: Soft. Bowel sounds are normal. He exhibits no distension and no mass. There is no tenderness.  Musculoskeletal: Normal range of motion. He exhibits tenderness ( There is no edema of the bilateral lower extremities but there is some tenderness over the dorsum and distal aspect of the right foot especially over the second through fourth toes, no bleeding, no deformity, no drainage, no purulence, no induration,). He exhibits no edema.  Lymphadenopathy:    He has no cervical adenopathy.  Neurological: He is alert. Coordination normal.  Pt has normal gait, follows commands without difficulty - according to pt family - there is no slurred speech  Skin: Skin is warm and dry. No rash noted. No erythema.  No erythema of the skin  Psychiatric: He has a normal mood and affect. His behavior is normal.  Nursing note  and vitals reviewed.    ED Treatments / Results  Labs (all labs ordered are listed, but only abnormal results are displayed) Labs Reviewed  BASIC METABOLIC PANEL - Abnormal; Notable for the following:       Result Value   Sodium 129 (*)    Chloride 99 (*)    CO2 20 (*)    Glucose, Bld 140 (*)    All other components within normal limits  CBC - Abnormal; Notable for the following:    HCT 36.9 (*)    MCV 69.6 (*)    MCH 24.7 (*)    All other components within normal limits  URINALYSIS, ROUTINE W REFLEX MICROSCOPIC - Abnormal; Notable for the following:    Ketones, ur 5 (*)    All  other components within normal limits  CBG MONITORING, ED - Abnormal; Notable for the following:    Glucose-Capillary 142 (*)    All other components within normal limits     Radiology Dg Foot Complete Right  Result Date: 04/22/2017 CLINICAL DATA:  Pain after chair rolled over foot 2 weeks prior EXAM: RIGHT FOOT COMPLETE - 3+ VIEW COMPARISON:  None. FINDINGS: Frontal, oblique, and lateral views were obtained. There is a tiny avulsion along the lateral aspect of the distal aspect of the fifth proximal phalanx. There is no other evident fracture. No dislocation. There is osteoarthritic change in the first MTP joint. There is spurring in the dorsal midfoot. No erosive change. There are foci of arterial vascular calcification. There are small spur posterior and inferior calcaneal spurs. IMPRESSION: Tiny avulsion along the lateral aspect of the distal portion of the fifth proximal phalanx. No other fracture. No dislocation. 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 No erosive change. Areas of arterial vascular calcification most likely are due to diabetes mellitus. Small calcaneal spurs noted. Electronically Signed   By: Bretta Bang III M.D.   On: 04/22/2017 21:54    Procedures Procedures (including critical care time)  Medications Ordered in ED Medications  traMADol (ULTRAM)  tablet 50 mg (not administered)     Initial Impression / Assessment and Plan / ED Course  I have reviewed the triage vital signs and the nursing notes.  Pertinent labs & imaging results that were available during my care of the patient were reviewed by me and considered in my medical decision making (see chart for details).     The patient is not complaining of chest pain or shortness of breath but has pain over his right foot after the injury from 2 weeks ago. Thus far there has been no imaging of this foot, I will obtain it today. The patient has some difficulty understanding his insulin dosing and making sure that it is appropriate given his blood sugar. I will also have to get this and the pharmacist recommendation regarding his dosing.  Has very small chip fracture of phalanx Pt and family informed CBG is OK here - no need for fluids or insulin Now states he has f/u in the [facility] Will be given Rx for Lantus - 5 Units / night - to be taken starting tomorrow. Family in agreement Post op shoe for fracture Pain meds for home All in agreement.  Final Clinical Impressions(s) / ED Diagnoses   Final diagnoses:  Closed displaced fracture of proximal phalanx of lesser toe of right foot, initial encounter  Hyperglycemia    New Prescriptions New Prescriptions   INSULIN GLARGINE (LANTUS) 100 UNIT/ML INJECTION    Inject 0.05 mLs (5 Units total) into the skin at bedtime.   MELOXICAM (MOBIC) 7.5 MG TABLET    Take 2 tablets (15 mg total) by mouth daily.     Eber Hong, MD 04/22/17 2216

## 2017-04-23 ENCOUNTER — Ambulatory Visit: Payer: Self-pay | Attending: Physician Assistant | Admitting: Physician Assistant

## 2017-04-23 ENCOUNTER — Encounter: Payer: Self-pay | Admitting: Physician Assistant

## 2017-04-23 VITALS — BP 103/70 | HR 99 | Temp 97.8°F | Resp 16 | Wt 188.4 lb

## 2017-04-23 DIAGNOSIS — Z8673 Personal history of transient ischemic attack (TIA), and cerebral infarction without residual deficits: Secondary | ICD-10-CM | POA: Insufficient documentation

## 2017-04-23 DIAGNOSIS — I509 Heart failure, unspecified: Secondary | ICD-10-CM | POA: Insufficient documentation

## 2017-04-23 DIAGNOSIS — Z794 Long term (current) use of insulin: Secondary | ICD-10-CM

## 2017-04-23 DIAGNOSIS — S92911A Unspecified fracture of right toe(s), initial encounter for closed fracture: Secondary | ICD-10-CM | POA: Insufficient documentation

## 2017-04-23 DIAGNOSIS — Z951 Presence of aortocoronary bypass graft: Secondary | ICD-10-CM | POA: Insufficient documentation

## 2017-04-23 DIAGNOSIS — I110 Hypertensive heart disease with heart failure: Secondary | ICD-10-CM | POA: Insufficient documentation

## 2017-04-23 DIAGNOSIS — Z7982 Long term (current) use of aspirin: Secondary | ICD-10-CM | POA: Insufficient documentation

## 2017-04-23 DIAGNOSIS — Z86711 Personal history of pulmonary embolism: Secondary | ICD-10-CM | POA: Insufficient documentation

## 2017-04-23 DIAGNOSIS — S92901D Unspecified fracture of right foot, subsequent encounter for fracture with routine healing: Secondary | ICD-10-CM

## 2017-04-23 DIAGNOSIS — Z79899 Other long term (current) drug therapy: Secondary | ICD-10-CM | POA: Insufficient documentation

## 2017-04-23 DIAGNOSIS — E118 Type 2 diabetes mellitus with unspecified complications: Secondary | ICD-10-CM

## 2017-04-23 DIAGNOSIS — I25810 Atherosclerosis of coronary artery bypass graft(s) without angina pectoris: Secondary | ICD-10-CM

## 2017-04-23 DIAGNOSIS — E871 Hypo-osmolality and hyponatremia: Secondary | ICD-10-CM | POA: Insufficient documentation

## 2017-04-23 DIAGNOSIS — E785 Hyperlipidemia, unspecified: Secondary | ICD-10-CM | POA: Insufficient documentation

## 2017-04-23 DIAGNOSIS — E1165 Type 2 diabetes mellitus with hyperglycemia: Secondary | ICD-10-CM | POA: Insufficient documentation

## 2017-04-23 DIAGNOSIS — Z8249 Family history of ischemic heart disease and other diseases of the circulatory system: Secondary | ICD-10-CM | POA: Insufficient documentation

## 2017-04-23 DIAGNOSIS — I2510 Atherosclerotic heart disease of native coronary artery without angina pectoris: Secondary | ICD-10-CM | POA: Insufficient documentation

## 2017-04-23 DIAGNOSIS — X58XXXA Exposure to other specified factors, initial encounter: Secondary | ICD-10-CM | POA: Insufficient documentation

## 2017-04-23 DIAGNOSIS — I5022 Chronic systolic (congestive) heart failure: Secondary | ICD-10-CM

## 2017-04-23 DIAGNOSIS — Z9889 Other specified postprocedural states: Secondary | ICD-10-CM | POA: Insufficient documentation

## 2017-04-23 LAB — GLUCOSE, POCT (MANUAL RESULT ENTRY): POC GLUCOSE: 264 mg/dL — AB (ref 70–99)

## 2017-04-23 MED ORDER — ASPIRIN EC 325 MG PO TBEC: 325.0000 mg | DELAYED_RELEASE_TABLET | Freq: Every day | ORAL | 11 refills | Status: DC

## 2017-04-23 MED ORDER — METFORMIN HCL 1000 MG PO TABS: 1000.0000 mg | ORAL_TABLET | Freq: Every day | ORAL | 11 refills | Status: DC

## 2017-04-23 MED ORDER — LOSARTAN POTASSIUM 25 MG PO TABS
25.0000 mg | ORAL_TABLET | Freq: Every day | ORAL | 11 refills | Status: DC
Start: 2017-04-23 — End: 2017-05-05

## 2017-04-23 MED ORDER — CARVEDILOL 3.125 MG PO TABS: 3.1250 mg | ORAL_TABLET | Freq: Two times a day (BID) | ORAL | 11 refills | Status: DC

## 2017-04-23 MED ORDER — ATORVASTATIN CALCIUM 80 MG PO TABS: 80.0000 mg | ORAL_TABLET | Freq: Every day | ORAL | 11 refills | Status: DC

## 2017-04-23 MED ORDER — TRUE METRIX METER DEVI: 1.0000 | Freq: Four times a day (QID) | 0 refills | Status: DC

## 2017-04-23 MED ORDER — GLUCOSE BLOOD VI STRP: ORAL_STRIP | 12 refills | Status: DC

## 2017-04-23 MED ORDER — TRUEPLUS LANCETS 28G MISC: 28.0000 g | Freq: Four times a day (QID) | 11 refills | Status: DC

## 2017-04-23 MED ORDER — FUROSEMIDE 20 MG PO TABS: 20.0000 mg | ORAL_TABLET | Freq: Every day | ORAL | 6 refills | Status: DC

## 2017-04-23 MED FILL — TRUE METRIX TEST STRIP: 25 days supply | Qty: 100 | Fill #0

## 2017-04-23 MED FILL — ATORVASTATIN 80 MG TABLET: 80 | 30 days supply | Qty: 30 | Fill #0

## 2017-04-23 MED FILL — TRUEplus LANCETS 28G MISC: 100 days supply | Qty: 100 | Fill #0

## 2017-04-23 MED FILL — FUROSEMIDE 20 MG TABLET: 20 | 30 days supply | Qty: 30 | Fill #0

## 2017-04-23 MED FILL — ?METFORMIN HCL 1,000 MG TAB: 1000 | 30 days supply | Qty: 30 | Fill #0

## 2017-04-23 MED FILL — LANTUS 100 UNITS/ML VIAL: 100 | 30 days supply | Qty: 10 | Fill #0

## 2017-04-23 MED FILL — MELOXICAM 7.5 MG TABLET: 7.5 | 15 days supply | Qty: 30 | Fill #0

## 2017-04-23 MED FILL — ?CARVEDILOL 3.125 MG TABLET: 3.125 | 30 days supply | Qty: 60 | Fill #0

## 2017-04-23 MED FILL — LOSARTAN POTASSIUM 25 MG TA: 25 | 30 days supply | Qty: 30 | Fill #0

## 2017-04-23 MED FILL — !TRUE METRIX BLOOD GLUCOSE: 365 days supply | Qty: 1 | Fill #0

## 2017-04-23 NOTE — Progress Notes (Signed)
CBG- 264 

## 2017-04-23 NOTE — Progress Notes (Signed)
[name]  MAU:[id number]  BWL:[id number]  DOB - 1945-10-29  Chief Complaint  Patient presents with  . Hospitalization Follow-up       Subjective:   [name] is a 71 y.o. male here today for establishment of care. He is originally from [place]. He has been visiting here 2-3 months. He is [place] speaking in a starter is helping with interpretation. In [place] he was diagnosed with coronary artery disease and is status post 2 vessel coronary artery bypass graft in 2007. He also has a history of diabetes mellitus type 2 previously on insulin and metformin, hypertension, stroke, hyperlipidemia but has been off medications for several months.  He presented to the emergency department on 04/17/2017 with complaints of shortness of breath. He also had dyspnea, orthopnea and PND. His chest x-ray showed no acute vascular congestion but there was evidence of cardiomegaly. His d-dimer was positive and a CTPA was negative. His peptide was 618. Troponins were negative. He was admitted by the cardiovascular team. Echocardiogram showed his EF to be 20/25 percent. He had mouth aortic insufficiency and mitral regurgitation. He underwent stress testing with no evidence of ischemia. He was gently diuresed and discharged home on guideline directed medical therapy on 04/20/2017. Of note his blood sugars were elevated in the 170s while there and his A1c was 9.4%. He also had some hyponatremia and microcytosis.  He went back to the emergency department on 04/22/2017 with complaints of pain in the right foot. X-ray showed a closed fracture of the last toe on the right foot. He was given meloxicam for which he has not had filled. He also was put in a postsurgical boot. He also was noted to have elevated blood sugars and insulin was started. He has not gotten this filled yet either.  He has no complaints today other than continued pain in the right foot. No chest pain. Breathing doing much better. He has been  taking his heart failure medications. He has follow-up in one week with the heart team. Nonsmoker. Trying to watch his diet. Does not have a blood sugar meter at home.   ROS: GEN: denies fever or chills, denies change in weight Skin: denies lesions or rashes HEENT: denies headache, earache, epistaxis, sore throat, or neck pain LUNGS: denies SHOB, dyspnea, PND, orthopnea CV: denies CP or palpitations ABD: denies abd pain, N or V EXT: denies muscle spasms or swelling; no pain in lower ext, no weakness NEURO: denies numbness or tingling, denies sz, stroke or TIA; pin and swelling right foot   ALLERGIES: No Known Allergies  PAST MEDICAL HISTORY: Past Medical History:  Diagnosis Date  . CAD (coronary artery disease) 2007   CABG 2007 in [place]  . Diabetes mellitus without complication ([place])   . Hyperlipidemia   . Hypertension   . Pulmonary embolism ([place])   . Stroke [facility])     PAST SURGICAL HISTORY: Past Surgical History:  Procedure Laterality Date  . PROSTATE SURGERY  2002    MEDICATIONS AT HOME: Prior to Admission medications   Medication Sig Start Date End Date Taking? Authorizing Provider  acetaminophen (TYLENOL) 650 MG CR tablet Take 650 mg by mouth at bedtime. FOR LEG PAIN   Yes [provider]  aspirin EC 325 MG tablet Take 1 tablet (325 mg total) by mouth daily. 04/23/17  Yes Ena Dawley, Kaisy Severino S, PA-C  atorvastatin (LIPITOR) 80 MG tablet Take 1 tablet (80 mg total) by mouth daily at 6 PM. 04/23/17  Yes Zettie Pho  S, PA-C  carvedilol (COREG) 3.125 MG tablet Take 1 tablet (3.125 mg total) by mouth 2 (two) times daily with a meal. 04/23/17  Yes Ena Dawley, Elia Keenum S, PA-C  furosemide (LASIX) 20 MG tablet Take 1 tablet (20 mg total) by mouth daily. 04/23/17  Yes Ena Dawley, Soraya Paquette S, PA-C  losartan (COZAAR) 25 MG tablet Take 1 tablet (25 mg total) by mouth daily. 04/23/17  Yes Ena Dawley, Paiton Fosco S, PA-C  metFORMIN (GLUCOPHAGE) 1000 MG tablet Take 1 tablet (1,000 mg total) by mouth daily with  breakfast. 04/23/17  Yes Ena Dawley, Raelynne Ludwick S, PA-C  Blood Glucose Monitoring Suppl (TRUE METRIX METER) DEVI 1 kit by Does not apply route 4 (four) times daily. 04/23/17   Brayton Caves, PA-C  feeding supplement, ENSURE ENLIVE, (ENSURE ENLIVE) LIQD Take 237 mLs by mouth 2 (two) times daily between meals. Patient not taking: Reported on 04/22/2017 04/20/17   Lendon Colonel, NP  feeding supplement, GLUCERNA SHAKE, (GLUCERNA SHAKE) LIQD Take 237 mLs by mouth 2 (two) times daily between meals.    [provider]  glucose blood (TRUE METRIX BLOOD GLUCOSE TEST) test strip Use as instructed 04/23/17   Brayton Caves, PA-C  insulin glargine (LANTUS) 100 UNIT/ML injection Inject 0.05 mLs (5 Units total) into the skin at bedtime. Patient not taking: Reported on 04/23/2017 04/22/17 05/02/17  Noemi Chapel, MD  meloxicam (MOBIC) 7.5 MG tablet Take 2 tablets (15 mg total) by mouth daily. Patient not taking: Reported on 04/23/2017 04/22/17   Noemi Chapel, MD  TRUEPLUS LANCETS 28G MISC 28 g by Does not apply route 4 (four) times daily. 04/23/17   Brayton Caves, PA-C    Family History  Problem Relation Age of Onset  . Hypertension Mother   . Hypertension Sister    Social-married, wife in [place]. +children and grands. Nonsmoker. 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  Objective:   Vitals:   04/23/17 0953  BP: 103/70  Pulse: 99  Resp: 16  Temp: 97.8 F (36.6 C)  TempSrc: Oral  SpO2: 95%  Weight: 188 lb 6.4 oz (85.5 kg)    Exam General appearance : Awake, alert, not in any distress. Speech Clear. Not toxic looking HEENT: Atraumatic and Normocephalic, pupils equally reactive to light and accomodation Neck: supple, no JVD. No cervical lymphadenopathy.  Chest:Good air entry bilaterally, no added sounds  CVS: S1 S2 regular, no murmurs.  Abdomen: Bowel sounds present, Non tender and not distended with no guarding, rigidity or rebound. Extremities: B/L Lower Ext shows no edema, both legs are warm to  touch Neurology: Awake alert, and oriented X 3, CN II-XII intact, Non focal Skin:No Rash Wounds:N/A  Data Review Lab Results  Component Value Date   HGBA1C 9.4 (H) 04/20/2017     Assessment & Plan  1. HFrEF, chronic  -GDMT  -low salt diet  -check weights/wtch fluid intake 2. CAD s/p remote CABG->stable  -on GDMT  -monitor for s/sx of progression  -CARDS follow up in 1 week 3. DM 2 with hyperglycemia  -meter kit today and bring log to ext appt  -start Lantus today  -Cont Metformin 100 mg daily  -may need diabetes education referral pending log at next visit 4. Hyponatremai  -BMP next visit  -cont Lasix 5. Right 5th toe fracture->closed  -non weight bearing  -post surgical boot 6. HTN  -cont current meds  Financial counselor Appt Return in about 2 weeks (around 05/07/2017).  The patient was given clear instructions to go to ER or return  to medical center if symptoms don't improve, worsen or new problems develop. The patient verbalized understanding. The patient was told to call to get lab results if they haven't heard anything in the next week.   Total time spent with patient was 27 min. Greater than 50 % of this visit was spent face to face counseling and coordinating care regarding risk factor modification, compliance importance and encouragement, education related to heart failure, diabetes and hypertension.  This note has been created with Surveyor, quantity. Any transcriptional errors are unintentional.    Zettie Pho, PA-C [facility] [place], [place]   04/23/2017, 10:34 AM

## 2017-04-24 ENCOUNTER — Other Ambulatory Visit: Payer: Self-pay

## 2017-04-24 ENCOUNTER — Encounter (HOSPITAL_COMMUNITY)
Admission: EM | Disposition: A | Discharge status: Rehabilitation Facility | Payer: Self-pay | Source: Home / Self Care | Attending: Cardiology

## 2017-04-24 ENCOUNTER — Inpatient Hospital Stay (HOSPITAL_COMMUNITY)
Admission: EM | Admit: 2017-04-24 | Discharge: 2017-05-05 | DRG: 270 | Disposition: A | Discharge status: Rehabilitation Facility | Payer: Medicaid Other | Attending: Cardiology | Admitting: Cardiology

## 2017-04-24 ENCOUNTER — Emergency Department (HOSPITAL_COMMUNITY): Payer: Medicaid Other

## 2017-04-24 ENCOUNTER — Encounter (HOSPITAL_COMMUNITY): Payer: Self-pay | Admitting: *Deleted

## 2017-04-24 ENCOUNTER — Telehealth: Payer: Self-pay | Admitting: Physician Assistant

## 2017-04-24 DIAGNOSIS — E11621 Type 2 diabetes mellitus with foot ulcer: Secondary | ICD-10-CM | POA: Diagnosis present

## 2017-04-24 DIAGNOSIS — J9600 Acute respiratory failure, unspecified whether with hypoxia or hypercapnia: Secondary | ICD-10-CM | POA: Diagnosis not present

## 2017-04-24 DIAGNOSIS — I252 Old myocardial infarction: Secondary | ICD-10-CM | POA: Diagnosis not present

## 2017-04-24 DIAGNOSIS — Z79899 Other long term (current) drug therapy: Secondary | ICD-10-CM | POA: Diagnosis not present

## 2017-04-24 DIAGNOSIS — E1151 Type 2 diabetes mellitus with diabetic peripheral angiopathy without gangrene: Secondary | ICD-10-CM | POA: Diagnosis present

## 2017-04-24 DIAGNOSIS — Z7982 Long term (current) use of aspirin: Secondary | ICD-10-CM | POA: Diagnosis not present

## 2017-04-24 DIAGNOSIS — I6522 Occlusion and stenosis of left carotid artery: Secondary | ICD-10-CM

## 2017-04-24 DIAGNOSIS — I255 Ischemic cardiomyopathy: Secondary | ICD-10-CM | POA: Diagnosis present

## 2017-04-24 DIAGNOSIS — D638 Anemia in other chronic diseases classified elsewhere: Secondary | ICD-10-CM

## 2017-04-24 DIAGNOSIS — I743 Embolism and thrombosis of arteries of the lower extremities: Secondary | ICD-10-CM | POA: Diagnosis not present

## 2017-04-24 DIAGNOSIS — I10 Essential (primary) hypertension: Secondary | ICD-10-CM

## 2017-04-24 DIAGNOSIS — G8194 Hemiplegia, unspecified affecting left nondominant side: Secondary | ICD-10-CM | POA: Diagnosis present

## 2017-04-24 DIAGNOSIS — R339 Retention of urine, unspecified: Secondary | ICD-10-CM | POA: Diagnosis present

## 2017-04-24 DIAGNOSIS — Z8249 Family history of ischemic heart disease and other diseases of the circulatory system: Secondary | ICD-10-CM

## 2017-04-24 DIAGNOSIS — E785 Hyperlipidemia, unspecified: Secondary | ICD-10-CM | POA: Diagnosis present

## 2017-04-24 DIAGNOSIS — N320 Bladder-neck obstruction: Secondary | ICD-10-CM | POA: Diagnosis present

## 2017-04-24 DIAGNOSIS — I110 Hypertensive heart disease with heart failure: Principal | ICD-10-CM | POA: Diagnosis present

## 2017-04-24 DIAGNOSIS — R29810 Facial weakness: Secondary | ICD-10-CM | POA: Diagnosis present

## 2017-04-24 DIAGNOSIS — Z7984 Long term (current) use of oral hypoglycemic drugs: Secondary | ICD-10-CM

## 2017-04-24 DIAGNOSIS — I63411 Cerebral infarction due to embolism of right middle cerebral artery: Secondary | ICD-10-CM | POA: Diagnosis not present

## 2017-04-24 DIAGNOSIS — I998 Other disorder of circulatory system: Secondary | ICD-10-CM | POA: Diagnosis present

## 2017-04-24 DIAGNOSIS — R29713 NIHSS score 13: Secondary | ICD-10-CM | POA: Diagnosis present

## 2017-04-24 DIAGNOSIS — R1310 Dysphagia, unspecified: Secondary | ICD-10-CM | POA: Diagnosis not present

## 2017-04-24 DIAGNOSIS — I639 Cerebral infarction, unspecified: Secondary | ICD-10-CM

## 2017-04-24 DIAGNOSIS — Z951 Presence of aortocoronary bypass graft: Secondary | ICD-10-CM | POA: Diagnosis not present

## 2017-04-24 DIAGNOSIS — E1165 Type 2 diabetes mellitus with hyperglycemia: Secondary | ICD-10-CM | POA: Diagnosis present

## 2017-04-24 DIAGNOSIS — Z9114 Patient's other noncompliance with medication regimen: Secondary | ICD-10-CM

## 2017-04-24 DIAGNOSIS — D62 Acute posthemorrhagic anemia: Secondary | ICD-10-CM

## 2017-04-24 DIAGNOSIS — Z91148 Patient's other noncompliance with medication regimen for other reason: Secondary | ICD-10-CM

## 2017-04-24 DIAGNOSIS — Z8673 Personal history of transient ischemic attack (TIA), and cerebral infarction without residual deficits: Secondary | ICD-10-CM

## 2017-04-24 DIAGNOSIS — E871 Hypo-osmolality and hyponatremia: Secondary | ICD-10-CM | POA: Diagnosis present

## 2017-04-24 DIAGNOSIS — I509 Heart failure, unspecified: Secondary | ICD-10-CM

## 2017-04-24 DIAGNOSIS — I5023 Acute on chronic systolic (congestive) heart failure: Secondary | ICD-10-CM

## 2017-04-24 DIAGNOSIS — D6489 Other specified anemias: Secondary | ICD-10-CM | POA: Diagnosis present

## 2017-04-24 DIAGNOSIS — I6340 Cerebral infarction due to embolism of unspecified cerebral artery: Secondary | ICD-10-CM | POA: Insufficient documentation

## 2017-04-24 DIAGNOSIS — I959 Hypotension, unspecified: Secondary | ICD-10-CM | POA: Diagnosis present

## 2017-04-24 DIAGNOSIS — J9690 Respiratory failure, unspecified, unspecified whether with hypoxia or hypercapnia: Secondary | ICD-10-CM

## 2017-04-24 DIAGNOSIS — Z86711 Personal history of pulmonary embolism: Secondary | ICD-10-CM

## 2017-04-24 DIAGNOSIS — E861 Hypovolemia: Secondary | ICD-10-CM | POA: Diagnosis not present

## 2017-04-24 DIAGNOSIS — I25810 Atherosclerosis of coronary artery bypass graft(s) without angina pectoris: Secondary | ICD-10-CM

## 2017-04-24 DIAGNOSIS — R0602 Shortness of breath: Secondary | ICD-10-CM | POA: Diagnosis present

## 2017-04-24 DIAGNOSIS — Z978 Presence of other specified devices: Secondary | ICD-10-CM

## 2017-04-24 DIAGNOSIS — I70201 Unspecified atherosclerosis of native arteries of extremities, right leg: Secondary | ICD-10-CM | POA: Diagnosis present

## 2017-04-24 DIAGNOSIS — E1142 Type 2 diabetes mellitus with diabetic polyneuropathy: Secondary | ICD-10-CM

## 2017-04-24 HISTORY — PX: RADIOLOGY WITH ANESTHESIA: SHX6223

## 2017-04-24 LAB — TROPONIN I: Troponin I: 0.05 ng/mL (ref <0.03)

## 2017-04-24 LAB — CBC
HCT: 35.6 % — ABNORMAL LOW (ref 39.0–52.0)
HEMOGLOBIN: 13.1 g/dL (ref 13.0–17.0)
MCH: 25.6 pg — AB (ref 26.0–34.0)
MCHC: 36.8 g/dL — ABNORMAL HIGH (ref 30.0–36.0)
MCV: 69.5 fL — ABNORMAL LOW (ref 78.0–100.0)
PLATELETS: 248 10*3/uL (ref 150–400)
RBC: 5.12 MIL/uL (ref 4.22–5.81)
RDW: 14 % (ref 11.5–15.5)
WBC: 6.9 10*3/uL (ref 4.0–10.5)

## 2017-04-24 LAB — BASIC METABOLIC PANEL
ANION GAP: 9 (ref 5–15)
BUN: 19 mg/dL (ref 6–20)
CALCIUM: 9 mg/dL (ref 8.9–10.3)
CO2: 21 mmol/L — ABNORMAL LOW (ref 22–32)
CREATININE: 1.05 mg/dL (ref 0.61–1.24)
Chloride: 96 mmol/L — ABNORMAL LOW (ref 101–111)
GLUCOSE: 173 mg/dL — AB (ref 65–99)
Potassium: 4.8 mmol/L (ref 3.5–5.1)
Sodium: 126 mmol/L — ABNORMAL LOW (ref 135–145)

## 2017-04-24 LAB — D-DIMER, QUANTITATIVE: D-Dimer, Quant: 0.92 ug/mL-FEU — ABNORMAL HIGH (ref 0.00–0.50)

## 2017-04-24 LAB — I-STAT TROPONIN, ED: TROPONIN I, POC: 0.11 ng/mL — AB (ref 0.00–0.08)

## 2017-04-24 LAB — BRAIN NATRIURETIC PEPTIDE: B Natriuretic Peptide: 832.8 pg/mL — ABNORMAL HIGH (ref 0.0–100.0)

## 2017-04-24 LAB — GLUCOSE, CAPILLARY
GLUCOSE-CAPILLARY: 214 mg/dL — AB (ref 65–99)
Glucose-Capillary: 167 mg/dL — ABNORMAL HIGH (ref 65–99)

## 2017-04-24 SURGERY — RADIOLOGY WITH ANESTHESIA
Anesthesia: General

## 2017-04-24 MED ORDER — POTASSIUM CHLORIDE CRYS ER 20 MEQ PO TBCR
20.0000 meq | EXTENDED_RELEASE_TABLET | Freq: Two times a day (BID) | ORAL | Status: DC
  Administered 2017-04-24: 20 meq via ORAL
  Filled 2017-04-24: qty 1

## 2017-04-24 MED ORDER — ATORVASTATIN CALCIUM 80 MG PO TABS
80.0000 mg | ORAL_TABLET | Freq: Every day | ORAL | Status: DC
  Administered 2017-04-24: 80 mg via ORAL
  Filled 2017-04-24: qty 1

## 2017-04-24 MED ORDER — SODIUM CHLORIDE 0.9% FLUSH
3.0000 mL | Freq: Two times a day (BID) | INTRAVENOUS | Status: DC
  Administered 2017-04-24: 3 mL via INTRAVENOUS

## 2017-04-24 MED ORDER — SODIUM CHLORIDE 0.9 % IV SOLN: 250.0000 mL | INTRAVENOUS | Status: DC | PRN

## 2017-04-24 MED ORDER — ASPIRIN EC 81 MG PO TBEC
81.0000 mg | DELAYED_RELEASE_TABLET | Freq: Every day | ORAL | Status: DC
  Administered 2017-04-24 – 2017-05-05 (×12): 81 mg via ORAL
  Filled 2017-04-24 (×12): qty 1

## 2017-04-24 MED ORDER — ASPIRIN 81 MG PO CHEW
324.0000 mg | CHEWABLE_TABLET | Freq: Once | ORAL | Status: AC
  Administered 2017-04-24: 324 mg via ORAL
  Filled 2017-04-24: qty 4

## 2017-04-24 MED ORDER — ACETAMINOPHEN 325 MG PO TABS
650.0000 mg | ORAL_TABLET | ORAL | Status: DC | PRN
  Administered 2017-04-24 – 2017-05-04 (×5): 650 mg via ORAL
  Filled 2017-04-24 (×5): qty 2

## 2017-04-24 MED ORDER — HEPARIN SODIUM (PORCINE) 5000 UNIT/ML IJ SOLN
5000.0000 [IU] | Freq: Three times a day (TID) | INTRAMUSCULAR | Status: DC
  Administered 2017-04-24: 5000 [IU] via SUBCUTANEOUS
  Filled 2017-04-24: qty 1

## 2017-04-24 MED ORDER — LOSARTAN POTASSIUM 25 MG PO TABS
25.0000 mg | ORAL_TABLET | Freq: Every day | ORAL | Status: DC
  Administered 2017-04-24: 25 mg via ORAL
  Filled 2017-04-24 (×2): qty 1

## 2017-04-24 MED ORDER — SODIUM CHLORIDE 0.9% FLUSH: 3.0000 mL | INTRAVENOUS | Status: DC | PRN

## 2017-04-24 MED ORDER — ONDANSETRON HCL 4 MG/2ML IJ SOLN: 4.0000 mg | Freq: Four times a day (QID) | INTRAMUSCULAR | Status: DC | PRN

## 2017-04-24 MED ORDER — GLUCERNA SHAKE PO LIQD
237.0000 mL | Freq: Three times a day (TID) | ORAL | Status: DC
  Administered 2017-04-24: 237 mL via ORAL

## 2017-04-24 MED ORDER — FUROSEMIDE 10 MG/ML IJ SOLN
40.0000 mg | Freq: Two times a day (BID) | INTRAMUSCULAR | Status: DC
  Administered 2017-04-24: 40 mg via INTRAVENOUS
  Filled 2017-04-24: qty 4

## 2017-04-24 NOTE — Telephone Encounter (Signed)
TOC Pt-Please call Patient-Pt has an appointment with Azalee CourseHao Meng ion 05-02-17.

## 2017-04-24 NOTE — Progress Notes (Signed)
Patient admitted to [facility] from Emergency Room, VSS, no chest pain but some dyspnea on exertion.  Family at bedside.

## 2017-04-24 NOTE — Telephone Encounter (Signed)
Pt currently admitted in hospital.

## 2017-04-24 NOTE — ED Triage Notes (Signed)
Pt recently hospitalized last week for same symptoms.  Pt is here today with sob and stays with daughter.Pt was treated for heart failure. No chest pain, but does appear to be working with his breathing.  Pt reports he cannot lay back to sleep. He is taking his medications including fluid pill

## 2017-04-24 NOTE — H&P (Signed)
Cardiology Admission History and Physical:   Patient ID: [name]; MRN: [id number]; DOB: September 13, 1945   Admission date: 04/24/2017  Primary Care Provider: Patient, No Pcp Per Primary Cardiologist: Dr. Stanford Breed  Chief Complaint:  Shortness of breath  Patient Profile:   [name] is a 71 y.o. male with a history of MI and CABG 2007, DM type 2, HTN, stroke and HLD who is being seen today for the evaluation of shortness of breath and CHF at the request of Dr. Ralene Bathe.   History of Present Illness:   [name] is [place] speaking. He is from [place] and has come to the [place] about a month ago. He had CABG in [place] in 2007. He had a stroke 2-3 years ago due to high blood pressure. He has never been on blood thinners. He had prostate surgery in 2002, but not cancer. He denies PE or clot to his lungs in the past. He has high cholesterol but is not medicated. He was previously not on any cardiac medications except for aspirin. He was only on Glucophage for diabetes.   He was seen admitted to the [facility] on 04/17/17 for complaints of shortness of breath and orthopnea. He had been having occasional bouts of dyspnea with exertion over the previous 3 years but this had gotten progressively worse since arriving in the [place]. He had not chest pain. An echocardiogram was done on 04/18/17 that revealed EF 20-25% with mild AR and mild MR. Troponins were negative. D Dimer was mildly elevated, CTA chest showed no PE> A Lexiscan myoview was done on 04/18/2017 that showed prior MI, no ischemia and EF <30%. He was discharged home on 04/20/17 daily lasix 20 mg, ARB, BB and statin. Discharge wt was 184 lbs. He was set up with primary care who he saw on 9/5 with no complaints. They are working with him to improve diabetes control. Plans to recheck echo in 3 months after medication titration to optimal dosing. If EF remains <35% will consider ICD. He has also been found to have a closed fracture in his right foot.   The patient was  doing well until 8pm last night when he developed shortness of breath. This was intermittent and he noted shortness of breath walking from his house to the car. He also had orthopnea and was unable to sleep all night. He denies chest pain/pressure, palpitations, lightheadedness, syncope/near syncope or edema.  He has been compliant with his medications as his daughter manages them. He follows low sodium diet. He does not weigh at home.   Pertinent findings Troponins  0.05, 0.11 (POC) BNP  832.8 SCr 1.05,   K+  4.8,   Na+  126,   Hgb 13.1, Hct 35.6 CXR:  Cardiac enlargement without heart failure. Mild right lower lobe atelectasis.  Past Medical History:  Diagnosis Date  . CAD (coronary artery disease) 2007   CABG 2007 in [place]  . CHF (congestive heart failure) ([place])   . Diabetes mellitus without complication ([place])   . Hyperlipidemia   . Hypertension   . Pulmonary embolism ([place])   . Stroke [facility])     Past Surgical History:  Procedure Laterality Date  . CARDIAC SURGERY    . PROSTATE SURGERY  2002     Medications Prior to Admission: Prior to Admission medications   Medication Sig Start Date End Date Taking? Authorizing Provider  acetaminophen (TYLENOL) 650 MG CR tablet Take 650 mg by mouth at bedtime. FOR LEG PAIN    [provider]  aspirin EC 325 MG tablet Take 1 tablet (325 mg total) by mouth daily. 04/23/17   Brayton Caves, PA-C  atorvastatin (LIPITOR) 80 MG tablet Take 1 tablet (80 mg total) by mouth daily at 6 PM. 04/23/17   Brayton Caves, PA-C  Blood Glucose Monitoring Suppl (TRUE METRIX METER) DEVI 1 kit by Does not apply route 4 (four) times daily. 04/23/17   Brayton Caves, PA-C  carvedilol (COREG) 3.125 MG tablet Take 1 tablet (3.125 mg total) by mouth 2 (two) times daily with a meal. 04/23/17   Ena Dawley, Tiffany S, PA-C  feeding supplement, ENSURE ENLIVE, (ENSURE ENLIVE) LIQD Take 237 mLs by mouth 2 (two) times daily between meals. Patient not taking: Reported on  04/22/2017 04/20/17   Lendon Colonel, NP  feeding supplement, GLUCERNA SHAKE, (GLUCERNA SHAKE) LIQD Take 237 mLs by mouth 2 (two) times daily between meals.    [provider]  furosemide (LASIX) 20 MG tablet Take 1 tablet (20 mg total) by mouth daily. 04/23/17   Brayton Caves, PA-C  glucose blood (TRUE METRIX BLOOD GLUCOSE TEST) test strip Use as instructed 04/23/17   Brayton Caves, PA-C  insulin glargine (LANTUS) 100 UNIT/ML injection Inject 0.05 mLs (5 Units total) into the skin at bedtime. Patient not taking: Reported on 04/23/2017 04/22/17 05/02/17  Noemi Chapel, MD  losartan (COZAAR) 25 MG tablet Take 1 tablet (25 mg total) by mouth daily. 04/23/17   Brayton Caves, PA-C  meloxicam (MOBIC) 7.5 MG tablet Take 2 tablets (15 mg total) by mouth daily. Patient not taking: Reported on 04/23/2017 04/22/17   Noemi Chapel, MD  metFORMIN (GLUCOPHAGE) 1000 MG tablet Take 1 tablet (1,000 mg total) by mouth daily with breakfast. 04/23/17   Brayton Caves, PA-C  TRUEPLUS LANCETS 28G MISC 28 g by Does not apply route 4 (four) times daily. 04/23/17   Brayton Caves, PA-C     Allergies:   No Known Allergies  Social History:   Social History   Social History  . Marital status: Married    Spouse name: N/A  . Number of children: N/A  . Years of education: N/A   Occupational History  . Not on file.   Social History Main Topics  . Smoking status: Never Smoker  . Smokeless tobacco: Never Used  . Alcohol use No  . Drug use: No  . Sexual activity: Not on file   Other Topics Concern  . Not on file   Social History Narrative   Pt is from [place] and speaks [place]. He came to the [place] in 02/2017.   He has a daughter and son-in-law that speak [place].     Family History:   The patient's family history includes Hypertension in his mother and sister.    ROS:  Please see the history of present illness.  All other ROS reviewed and negative.     Physical Exam/Data:   Vitals:   04/24/17 1003 04/24/17  1025 04/24/17 1045 04/24/17 1100  BP: 113/82  120/89 129/89  Pulse: 82  81 79  Resp: (!) 26  (!) 24 11  Temp: 98.1 F (36.7 C)     TempSrc: Oral     SpO2: 98% 99% 100% 100%   No intake or output data in the 24 hours ending 04/24/17 1127 There were no vitals filed for this visit. There is no height or weight on file to calculate BMI.  General:  Well nourished, well developed, in  no acute distress HEENT: normal Lymph: no adenopathy Neck:  + JVD Endocrine:  No thryomegaly Vascular: No carotid bruits; pedal pulses 2+ bilaterally  Cardiac:  normal S1, S2; RRR; no murmur  Lungs:  clear to auscultation bilaterally, few crackles in left base Abd: soft, nontender, no hepatomegaly  Ext: no edema, soft fracture boot on right foot Musculoskeletal:  No deformities, BUE and BLE strength normal and equal Skin: warm and dry  Neuro:  CNs 2-12 intact, no focal abnormalities noted Psych:  Normal affect    EKG:  The ECG that was done was personally reviewed and demonstrates sinus rhythm with IVCD 78 bpm.   Relevant CV Studies:  Echocardiogram 04/18/17 Study Conclusions  - Left ventricle: The cavity size was moderately dilated. Wall   thickness was normal. Systolic function was severely reduced. The   estimated ejection fraction was in the range of 20% to 25%. - Aortic valve: There was mild regurgitation. - Mitral valve: There was mild regurgitation.  Lexiscan Myoview 04/18/2017 Study Result    There was no ST segment deviation noted during stress.  Defect 1: There is a medium defect of severe severity present in the basal inferolateral, mid inferior, mid inferolateral and apical inferior location.  Findings consistent with prior myocardial infarction.  This is a high risk study due to reduced systolic function.  The left ventricular ejection fraction is severely decreased (<30%).  No ischemia.     Laboratory Data:  Chemistry Recent Labs Lab 04/22/17 1321 04/24/17 1000    NA 129* 126*  K 4.7 4.8  CL 99* 96*  CO2 20* 21*  GLUCOSE 140* 173*  BUN 19 19  CREATININE 1.11 1.05  CALCIUM 9.3 9.0  GFRNONAA >60 >60  GFRAA >60 >60  ANIONGAP 10 9    No results for input(s): PROT, ALBUMIN, AST, ALT, ALKPHOS, BILITOT in the last 168 hours. Hematology Recent Labs Lab 04/22/17 1321 04/24/17 1000  WBC 7.9 6.9  RBC 5.30 5.12  HGB 13.1 13.1  HCT 36.9* 35.6*  MCV 69.6* 69.5*  MCH 24.7* 25.6*  MCHC 35.5 36.8*  RDW 14.2 14.0  PLT 267 248   Cardiac Enzymes Recent Labs Lab 04/17/17 1656 04/17/17 2205 04/18/17 0356  TROPONINI <0.03 <0.03 <0.03    Recent Labs Lab 04/24/17 1012  TROPIPOC 0.11*    BNPNo results for input(s): BNP, PROBNP in the last 168 hours.  DDimer  Recent Labs Lab 04/17/17 1656  DDIMER 0.77*    Radiology/Studies:  Dg Chest 2 View  Result Date: 04/24/2017 CLINICAL DATA:  Short of breath EXAM: CHEST  2 VIEW COMPARISON:  04/17/2017 FINDINGS: Cardiac enlargement without heart failure. Negative for infiltrate or effusion. Negative for mass or adenopathy. Mild right lower lobe atelectasis IMPRESSION: Cardiac enlargement without heart failure Mild right lower lobe atelectasis. Electronically Signed   By: Franchot Gallo M.D.   On: 04/24/2017 10:34    Assessment and Plan:   Acute on chronic systolic heart failure -EF 20-25% by echo on 04/18/3017 -Recently hospitalized 8/30 for dyspnea and found to have systolic heart failure. Guideline directed therapy initiated with BB, ARB, lasix, aspirin and statin.  -Pt was feeling well until 8 pm last night when he developed shortness of breath off and on. He also had orthopnea and was unable to sleep all night. He denies chest pain/pressure, palpitations, lightheadedness, syncope/near syncope or edema.  -Discharge wt on 9/2 was 184. Today wt is 188. -BNP 832.8. (Previously 618 on 8/30) -Pt has been complaints with meds and  follows a low sodium diet.  -CXR does not show heart failure -Will admit  and diurese with lasix 40 mg IV BID. Hold carvedilol. Continue losartan as tolerated.  -Will consider cardiac cath for further evaluation in a few days after diuresing.   CAD  -Hx of MI and CABG in 2007 with no quality follow up. -Lexiscan myoview done on 04/18/17 shows old MI, no ischemia, EF <30% -Started on aspirin, statin, ARB, BB -Troponins 0.05, 0.11 (POC) (at same point in time) related to CHF -No chest pain  Hyponatremia -Sodium level 126. Possibly related to CHF and fluid volume overload. Diurese and continue to follow.  Hypertension -Recently started on losartan 25 mg, carvedilol 3.125 mg bid and lasix 20 mg  -Blood pressure well controlled here. Has not had his meds yet this am. BP was 103/70 at office visit yesterday.   Hyperlipidemia -LDL 93 on 04/19/17 -Recently started on atorvastatin 80 mg daily  Diabetes type 2 on insulin -Has been poorly controlled. Hemoglobin A1c 9.4 on 04/20/17. Seen at [facility] yesterday and pt given glucose monitor and started on Lantus  Severity of Illness: The appropriate patient status for this patient is INPATIENT. Inpatient status is judged to be reasonable and necessary in order to provide the required intensity of service to ensure the patient's safety. The patient's presenting symptoms, physical exam findings, and initial radiographic and laboratory data in the context of their chronic comorbidities is felt to place them at high risk for further clinical deterioration. Furthermore, it is not anticipated that the patient will be medically stable for discharge from the hospital within 2 midnights of admission. The following factors support the patient status of inpatient.   " The patient's presenting symptoms include dyspnea and orthponea. " The worrisome physical exam findings include lungs with crackles in left base, +JVD. " The initial radiographic and laboratory data are worrisome because of Na 126,  Troponin 0.05, BNP 832.8. " The chronic co-morbidities include Systolic heart failure with EF 20-25%, ischemic cardiomyopathy, CAD, poorly controlled diabetes.   * I certify that at the point of admission it is my clinical judgment that the patient will require inpatient hospital care spanning beyond 2 midnights from the point of admission due to high intensity of service, high risk for further deterioration and high frequency of surveillance required.*    Signed, Daune Perch, NP  04/24/2017 11:27 AM   Personally seen and examined. Agree with above.  Readmission for acute on chronic systolic HF  - will place on Lasix 40IV BID with KCL  - Hold coreg 3.125 BID  - Continue losartan 80m  - Hyponatremia - low output related, predictor of poor prognosis   Exam: JVD to jaw line, RRR, minimal increase work of breathing, mild crackles left base. Trace LE edema.   Will have low threshold to contact advanced HF team. May need milrinone/ PICC for co-ox.  Once fluid status improved, right and left heart cath would be helpful.   MCandee Furbish MD

## 2017-04-24 NOTE — ED Provider Notes (Signed)
[facility] DEPT Provider Note   CSN: [id number] Arrival date & time: 04/24/17  8588     History   Chief Complaint Chief Complaint  Patient presents with  . Shortness of Breath    Pt speaks [place]    HPI [name] is a 71 y.o. male.  The history is provided by the patient and a relative. A language interpreter was used.  Shortness of Breath    [name] is a 71 y.o. male who presents to the Emergency Department complaining of sob.  He presents accompanied by family for evaluation of shortness of breath. He was recently admitted for congestive heart failure and has been compliant with his medications. He was doing well after his hospital discharge until last night when he became short of breath. Shortness of breath is worse with laying supine and improves with sitting up. He feels like he has water running in his chest. No fevers, chest pain, diaphoresis, leg swelling, vomiting, vomiting, diarrhea. He was seen in the emergency department 2 days ago for a toe fracture and is placed in a postop shoe and he does have some leg swelling. Past Medical History:  Diagnosis Date  . CAD (coronary artery disease) 2007   CABG 2007 in [place]  . CHF (congestive heart failure) ([place])   . Diabetes mellitus without complication ([place])   . Hyperlipidemia   . Hypertension   . Pulmonary embolism ([place])   . Stroke [facility])     Patient Active Problem List   Diagnosis Date Noted  . CHF (congestive heart failure) ([place]) 04/17/2017    Past Surgical History:  Procedure Laterality Date  . CARDIAC SURGERY    . PROSTATE SURGERY  2002       Home Medications    Prior to Admission medications   Medication Sig Start Date End Date Taking? Authorizing Provider  acetaminophen (TYLENOL) 650 MG CR tablet Take 650 mg by mouth at bedtime. FOR LEG PAIN    [provider]  aspirin EC 325 MG tablet Take 1 tablet (325 mg total) by mouth daily. 04/23/17   Brayton Caves, PA-C  atorvastatin  (LIPITOR) 80 MG tablet Take 1 tablet (80 mg total) by mouth daily at 6 PM. 04/23/17   Brayton Caves, PA-C  Blood Glucose Monitoring Suppl (TRUE METRIX METER) DEVI 1 kit by Does not apply route 4 (four) times daily. 04/23/17   Brayton Caves, PA-C  carvedilol (COREG) 3.125 MG tablet Take 1 tablet (3.125 mg total) by mouth 2 (two) times daily with a meal. 04/23/17   Ena Dawley, Tiffany S, PA-C  feeding supplement, ENSURE ENLIVE, (ENSURE ENLIVE) LIQD Take 237 mLs by mouth 2 (two) times daily between meals. Patient not taking: Reported on 04/22/2017 04/20/17   Lendon Colonel, NP  feeding supplement, GLUCERNA SHAKE, (GLUCERNA SHAKE) LIQD Take 237 mLs by mouth 2 (two) times daily between meals.    [provider]  furosemide (LASIX) 20 MG tablet Take 1 tablet (20 mg total) by mouth daily. 04/23/17   Brayton Caves, PA-C  glucose blood (TRUE METRIX BLOOD GLUCOSE TEST) test strip Use as instructed 04/23/17   Brayton Caves, PA-C  insulin glargine (LANTUS) 100 UNIT/ML injection Inject 0.05 mLs (5 Units total) into the skin at bedtime. Patient not taking: Reported on 04/23/2017 04/22/17 05/02/17  Noemi Chapel, MD  losartan (COZAAR) 25 MG tablet Take 1 tablet (25 mg total) by mouth daily. 04/23/17   Brayton Caves, PA-C  meloxicam (MOBIC) 7.5 MG  tablet Take 2 tablets (15 mg total) by mouth daily. Patient not taking: Reported on 04/23/2017 04/22/17   Noemi Chapel, MD  metFORMIN (GLUCOPHAGE) 1000 MG tablet Take 1 tablet (1,000 mg total) by mouth daily with breakfast. 04/23/17   Brayton Caves, PA-C  TRUEPLUS LANCETS 28G MISC 28 g by Does not apply route 4 (four) times daily. 04/23/17   Brayton Caves, PA-C    Family History Family History  Problem Relation Age of Onset  . Hypertension Mother   . Hypertension Sister     Social History Social History  Substance Use Topics  . Smoking status: Never Smoker  . Smokeless tobacco: Never Used  . Alcohol use No     Allergies   Patient has no known  allergies.   Review of Systems Review of Systems  Respiratory: Positive for shortness of breath.   All other systems reviewed and are negative.    Physical Exam Updated Vital Signs BP 113/82 (BP Location: Right Arm)   Pulse 82   Temp 98.1 F (36.7 C) (Oral)   Resp (!) 26   SpO2 99%   Physical Exam  Constitutional: He is oriented to person, place, and time. He appears well-developed and well-nourished.  HENT:  Head: Normocephalic and atraumatic.  Cardiovascular: Normal rate and regular rhythm.   No murmur heard. Pulmonary/Chest: Effort normal. No respiratory distress.  Occasional crackle in the right lung base  Abdominal: Soft. There is no tenderness. There is no rebound and no guarding.  Musculoskeletal: He exhibits no tenderness.  Trace edema to BLE, right greater than left  Neurological: He is alert and oriented to person, place, and time.  Skin: Skin is warm and dry.  Psychiatric: He has a normal mood and affect. His behavior is normal.  Nursing note and vitals reviewed.    ED Treatments / Results  Labs (all labs ordered are listed, but only abnormal results are displayed) Labs Reviewed  I-STAT TROPONIN, ED - Abnormal; Notable for the following:       Result Value   Troponin i, poc 0.11 (*)    All other components within normal limits  BASIC METABOLIC PANEL  CBC  BRAIN NATRIURETIC PEPTIDE  TROPONIN I  D-DIMER, QUANTITATIVE (NOT AT [facility])    EKG  EKG Interpretation None       Radiology Dg Foot Complete Right  Result Date: 04/22/2017 CLINICAL DATA:  Pain after chair rolled over foot 2 weeks prior EXAM: RIGHT FOOT COMPLETE - 3+ VIEW COMPARISON:  None. FINDINGS: Frontal, oblique, and lateral views were obtained. There is a tiny avulsion along the lateral aspect of the distal aspect of the fifth proximal phalanx. There is no other evident fracture. No dislocation. There is osteoarthritic change in the first MTP joint. There is spurring in the dorsal midfoot.  No erosive change. There are foci of arterial vascular calcification. There are small spur posterior and inferior calcaneal spurs. IMPRESSION: Tiny avulsion along the lateral aspect of the distal portion of the fifth proximal phalanx. No other fracture. No dislocation. 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 No erosive change. Areas of arterial vascular calcification most likely are due to diabetes mellitus. Small calcaneal spurs noted. Electronically Signed   By: Lowella Grip III M.D.   On: 04/22/2017 21:54    Procedures Procedures (including critical care time)  Medications Ordered in ED Medications  aspirin chewable tablet 324 mg (not administered)     Initial Impression / Assessment and Plan /  ED Course  I have reviewed the triage vital signs and the nursing notes.  Pertinent labs & imaging results that were available during my care of the patient were reviewed by me and considered in my medical decision making (see chart for details).    Pt with hx/o of CHF here with progressive SOB, orthopnea.  No hypoxia in ED.  Concern for progressive CHF.  Troponin elevated compared to recent hospitalization.  Cardiology consulted for recommendations.    Final Clinical Impressions(s) / ED Diagnoses   Final diagnoses:  None    New Prescriptions New Prescriptions   No medications on file     Quintella Reichert, MD 04/25/17 [phone]

## 2017-04-24 NOTE — ED Notes (Signed)
Dr. Rees at bedside at this time.  

## 2017-04-24 NOTE — ED Notes (Signed)
Cardiology at bedside at this time

## 2017-04-25 ENCOUNTER — Inpatient Hospital Stay (HOSPITAL_COMMUNITY): Payer: Medicaid Other

## 2017-04-25 ENCOUNTER — Encounter (HOSPITAL_COMMUNITY): Payer: Self-pay | Admitting: Certified Registered"

## 2017-04-25 ENCOUNTER — Inpatient Hospital Stay (HOSPITAL_COMMUNITY): Payer: Medicaid Other | Admitting: Certified Registered"

## 2017-04-25 ENCOUNTER — Other Ambulatory Visit: Payer: Self-pay

## 2017-04-25 ENCOUNTER — Encounter (HOSPITAL_COMMUNITY)
Admission: EM | Disposition: A | Discharge status: Rehabilitation Facility | Payer: Self-pay | Source: Home / Self Care | Attending: Cardiology

## 2017-04-25 DIAGNOSIS — I743 Embolism and thrombosis of arteries of the lower extremities: Secondary | ICD-10-CM

## 2017-04-25 DIAGNOSIS — I63411 Cerebral infarction due to embolism of right middle cerebral artery: Secondary | ICD-10-CM

## 2017-04-25 DIAGNOSIS — I6340 Cerebral infarction due to embolism of unspecified cerebral artery: Secondary | ICD-10-CM | POA: Insufficient documentation

## 2017-04-25 HISTORY — PX: CYSTOSCOPY: SHX5120

## 2017-04-25 HISTORY — PX: PATCH ANGIOPLASTY: SHX6230

## 2017-04-25 HISTORY — PX: IR PERCUTANEOUS ART THROMBECTOMY/INFUSION INTRACRANIAL INC DIAG ANGIO: IMG6087

## 2017-04-25 HISTORY — PX: EMBOLECTOMY: SHX44

## 2017-04-25 LAB — POCT I-STAT 7, (LYTES, BLD GAS, ICA,H+H)
Acid-base deficit: 6 mmol/L — ABNORMAL HIGH (ref 0.0–2.0)
Bicarbonate: 19.2 mmol/L — ABNORMAL LOW (ref 20.0–28.0)
Calcium, Ion: 1.11 mmol/L — ABNORMAL LOW (ref 1.15–1.40)
HEMATOCRIT: 38 % — AB (ref 39.0–52.0)
HEMOGLOBIN: 12.9 g/dL — AB (ref 13.0–17.0)
O2 Saturation: 95 %
POTASSIUM: 4.6 mmol/L (ref 3.5–5.1)
Patient temperature: 34.8
SODIUM: 129 mmol/L — AB (ref 135–145)
TCO2: 20 mmol/L — ABNORMAL LOW (ref 22–32)
pCO2 arterial: 33.9 mmHg (ref 32.0–48.0)
pH, Arterial: 7.351 (ref 7.350–7.450)
pO2, Arterial: 70 mmHg — ABNORMAL LOW (ref 83.0–108.0)

## 2017-04-25 LAB — CBC
HCT: 32.2 % — ABNORMAL LOW (ref 39.0–52.0)
HEMATOCRIT: 28.5 % — AB (ref 39.0–52.0)
HEMOGLOBIN: 10.5 g/dL — AB (ref 13.0–17.0)
Hemoglobin: 11.5 g/dL — ABNORMAL LOW (ref 13.0–17.0)
MCH: 24.8 pg — ABNORMAL LOW (ref 26.0–34.0)
MCH: 25.5 pg — ABNORMAL LOW (ref 26.0–34.0)
MCHC: 35.7 g/dL (ref 30.0–36.0)
MCHC: 36.8 g/dL — AB (ref 30.0–36.0)
MCV: 69.5 fL — ABNORMAL LOW (ref 78.0–100.0)
MCV: 69.3 fL — AB (ref 78.0–100.0)
Platelets: 213 10*3/uL (ref 150–400)
Platelets: 194 10*3/uL (ref 150–400)
RBC: 4.63 MIL/uL (ref 4.22–5.81)
RBC: 4.11 MIL/uL — ABNORMAL LOW (ref 4.22–5.81)
RDW: 14.1 % (ref 11.5–15.5)
RDW: 14.2 % (ref 11.5–15.5)
WBC: 8.1 10*3/uL (ref 4.0–10.5)
WBC: 8.8 10*3/uL (ref 4.0–10.5)

## 2017-04-25 LAB — BASIC METABOLIC PANEL
ANION GAP: 8 (ref 5–15)
BUN: 18 mg/dL (ref 6–20)
CALCIUM: 8.1 mg/dL — AB (ref 8.9–10.3)
CO2: 19 mmol/L — AB (ref 22–32)
CREATININE: 1.05 mg/dL (ref 0.61–1.24)
Chloride: 101 mmol/L (ref 101–111)
GFR calc Af Amer: >60 mL/min (ref >60)
GLUCOSE: 246 mg/dL — AB (ref 65–99)
Potassium: 4.8 mmol/L (ref 3.5–5.1)
Sodium: 128 mmol/L — ABNORMAL LOW (ref 135–145)

## 2017-04-25 LAB — BLOOD GAS, ARTERIAL
Acid-base deficit: 4.2 mmol/L — ABNORMAL HIGH (ref 0.0–2.0)
BICARBONATE: 20 mmol/L (ref 20.0–28.0)
Drawn by: 51191
FIO2: 60
LHR: 12 {breaths}/min
O2 SAT: 98.2 %
PATIENT TEMPERATURE: 97.5
PCO2 ART: 33.8 mmHg (ref 32.0–48.0)
PEEP/CPAP: 5 cmH2O
PO2 ART: 146 mmHg — AB (ref 83.0–108.0)
VT: 550 mL
pH, Arterial: 7.387 (ref 7.350–7.450)

## 2017-04-25 LAB — GLUCOSE, CAPILLARY
GLUCOSE-CAPILLARY: 244 mg/dL — AB (ref 65–99)
GLUCOSE-CAPILLARY: 238 mg/dL — AB (ref 65–99)
GLUCOSE-CAPILLARY: 188 mg/dL — AB (ref 65–99)
Glucose-Capillary: 229 mg/dL — ABNORMAL HIGH (ref 65–99)
Glucose-Capillary: 183 mg/dL — ABNORMAL HIGH (ref 65–99)
Glucose-Capillary: 156 mg/dL — ABNORMAL HIGH (ref 65–99)
Glucose-Capillary: 129 mg/dL — ABNORMAL HIGH (ref 65–99)

## 2017-04-25 LAB — RENAL FUNCTION PANEL
Albumin: 3.2 g/dL — ABNORMAL LOW (ref 3.5–5.0)
Anion gap: 9 (ref 5–15)
BUN: 17 mg/dL (ref 6–20)
CHLORIDE: 101 mmol/L (ref 101–111)
CO2: 18 mmol/L — AB (ref 22–32)
Calcium: 8.2 mg/dL — ABNORMAL LOW (ref 8.9–10.3)
Creatinine, Ser: 1.06 mg/dL (ref 0.61–1.24)
GFR calc Af Amer: >60 mL/min (ref >60)
GFR calc non Af Amer: >60 mL/min (ref >60)
GLUCOSE: 246 mg/dL — AB (ref 65–99)
POTASSIUM: 4.8 mmol/L (ref 3.5–5.1)
Phosphorus: 4.4 mg/dL (ref 2.5–4.6)
Sodium: 128 mmol/L — ABNORMAL LOW (ref 135–145)

## 2017-04-25 LAB — PREPARE RBC (CROSSMATCH)

## 2017-04-25 LAB — PROTIME-INR
INR: 1.14
PROTHROMBIN TIME: 14.5 s (ref 11.4–15.2)

## 2017-04-25 LAB — MAGNESIUM: MAGNESIUM: 1.8 mg/dL (ref 1.7–2.4)

## 2017-04-25 LAB — MRSA PCR SCREENING: MRSA by PCR: NEGATIVE

## 2017-04-25 LAB — TRIGLYCERIDES: TRIGLYCERIDES: 50 mg/dL (ref <150)

## 2017-04-25 LAB — ABO/RH: ABO/RH(D): B POS

## 2017-04-25 LAB — APTT: APTT: 34 s (ref 24–36)

## 2017-04-25 SURGERY — EMBOLECTOMY
Anesthesia: General | Site: Urethra | Laterality: Right

## 2017-04-25 MED ORDER — DEXTROSE 5 % IV SOLN
1.5000 g | Freq: Two times a day (BID) | INTRAVENOUS | Status: AC
  Administered 2017-04-25 – 2017-04-26 (×2): 1.5 g via INTRAVENOUS
  Filled 2017-04-25 (×3): qty 1.5

## 2017-04-25 MED ORDER — GADOBENATE DIMEGLUMINE 529 MG/ML IV SOLN
18.0000 mL | Freq: Once | INTRAVENOUS | Status: AC | PRN
  Administered 2017-04-25: 18 mL via INTRAVENOUS

## 2017-04-25 MED ORDER — DEXTROSE 5 % IV SOLN
0.0000 ug/min | INTRAVENOUS | Status: DC
  Administered 2017-04-25: 2 ug/min via INTRAVENOUS
  Filled 2017-04-25 (×2): qty 4

## 2017-04-25 MED ORDER — INSULIN ASPART 100 UNIT/ML ~~LOC~~ SOLN
0.0000 [IU] | SUBCUTANEOUS | Status: DC
  Administered 2017-04-25: 3 [IU] via SUBCUTANEOUS
  Administered 2017-04-25: 2 [IU] via SUBCUTANEOUS
  Administered 2017-04-25: 3 [IU] via SUBCUTANEOUS
  Administered 2017-04-25: 2 [IU] via SUBCUTANEOUS
  Administered 2017-04-26: 1 [IU] via SUBCUTANEOUS
  Administered 2017-04-26: 2 [IU] via SUBCUTANEOUS
  Administered 2017-04-26 (×4): 1 [IU] via SUBCUTANEOUS
  Administered 2017-04-27 (×2): 2 [IU] via SUBCUTANEOUS
  Administered 2017-04-27 – 2017-04-28 (×5): 1 [IU] via SUBCUTANEOUS
  Administered 2017-04-28: 2 [IU] via SUBCUTANEOUS
  Administered 2017-04-28: 1 [IU] via SUBCUTANEOUS
  Administered 2017-04-28 (×2): 3 [IU] via SUBCUTANEOUS
  Administered 2017-04-29 (×2): 2 [IU] via SUBCUTANEOUS
  Administered 2017-04-29: 1 [IU] via SUBCUTANEOUS
  Administered 2017-04-29 – 2017-04-30 (×3): 2 [IU] via SUBCUTANEOUS
  Administered 2017-04-30: 1 [IU] via SUBCUTANEOUS
  Administered 2017-04-30: 3 [IU] via SUBCUTANEOUS
  Administered 2017-04-30: 1 [IU] via SUBCUTANEOUS
  Administered 2017-04-30: 3 [IU] via SUBCUTANEOUS
  Administered 2017-04-30 – 2017-05-01 (×6): 2 [IU] via SUBCUTANEOUS
  Filled 2017-04-25 (×52): qty 0.09

## 2017-04-25 MED ORDER — IOPAMIDOL (ISOVUE-370) INJECTION 76%
INTRAVENOUS | Status: AC
  Administered 2017-04-25: 01:00:00
  Filled 2017-04-25: qty 50

## 2017-04-25 MED ORDER — LIDOCAINE HCL (CARDIAC) 20 MG/ML IV SOLN
INTRAVENOUS | Status: DC | PRN
  Administered 2017-04-25: 60 mg via INTRAVENOUS

## 2017-04-25 MED ORDER — ETOMIDATE 2 MG/ML IV SOLN
INTRAVENOUS | Status: DC | PRN
  Administered 2017-04-25: 14 mg via INTRAVENOUS

## 2017-04-25 MED ORDER — CHLORHEXIDINE GLUCONATE 0.12% ORAL RINSE (MEDLINE KIT)
15.0000 mL | Freq: Two times a day (BID) | OROMUCOSAL | Status: DC
  Administered 2017-04-25 – 2017-04-27 (×5): 15 mL via OROMUCOSAL

## 2017-04-25 MED ORDER — SODIUM CHLORIDE 0.9 % IV SOLN
INTRAVENOUS | Status: DC
  Administered 2017-04-26: 13:00:00 via INTRAVENOUS

## 2017-04-25 MED ORDER — PANTOPRAZOLE SODIUM 40 MG IV SOLR: 40.0000 mg | Freq: Every day | INTRAVENOUS | Status: DC

## 2017-04-25 MED ORDER — FENTANYL CITRATE (PF) 100 MCG/2ML IJ SOLN
INTRAMUSCULAR | Status: AC
  Administered 2017-04-25: 50 ug via INTRAVENOUS
  Filled 2017-04-25: qty 2

## 2017-04-25 MED ORDER — STROKE: EARLY STAGES OF RECOVERY BOOK
Freq: Once | Status: AC
  Administered 2017-04-25: 1
  Filled 2017-04-25: qty 1

## 2017-04-25 MED ORDER — SODIUM CHLORIDE 0.9 % IV SOLN
INTRAVENOUS | Status: DC | PRN
  Administered 2017-04-25: 07:00:00 500 mL

## 2017-04-25 MED ORDER — 0.9 % SODIUM CHLORIDE (POUR BTL) OPTIME
TOPICAL | Status: DC | PRN
  Administered 2017-04-25: 2000 mL

## 2017-04-25 MED ORDER — CHLORHEXIDINE GLUCONATE 0.12% ORAL RINSE (MEDLINE KIT): 15.0000 mL | Freq: Two times a day (BID) | OROMUCOSAL | Status: DC

## 2017-04-25 MED ORDER — CEFAZOLIN SODIUM-DEXTROSE 2-3 GM-% IV SOLR
INTRAVENOUS | Status: DC | PRN
  Administered 2017-04-25: 2 g via INTRAVENOUS

## 2017-04-25 MED ORDER — EPHEDRINE SULFATE 50 MG/ML IJ SOLN
INTRAMUSCULAR | Status: DC | PRN
  Administered 2017-04-25 (×3): 10 mg via INTRAVENOUS

## 2017-04-25 MED ORDER — POTASSIUM CHLORIDE CRYS ER 20 MEQ PO TBCR: 20.0000 meq | EXTENDED_RELEASE_TABLET | Freq: Every day | ORAL | Status: DC | PRN

## 2017-04-25 MED ORDER — LIDOCAINE VISCOUS 2 % MT SOLN
OROMUCOSAL | Status: AC
  Filled 2017-04-25: qty 15

## 2017-04-25 MED ORDER — IOPAMIDOL (ISOVUE-300) INJECTION 61%
INTRAVENOUS | Status: AC
  Administered 2017-04-25: 40 mL
  Filled 2017-04-25: qty 150

## 2017-04-25 MED ORDER — ROCURONIUM BROMIDE 100 MG/10ML IV SOLN
INTRAVENOUS | Status: DC | PRN
  Administered 2017-04-25 (×2): 50 mg via INTRAVENOUS

## 2017-04-25 MED ORDER — FENTANYL CITRATE (PF) 100 MCG/2ML IJ SOLN
INTRAMUSCULAR | Status: DC | PRN
  Administered 2017-04-25: 100 ug via INTRAVENOUS

## 2017-04-25 MED ORDER — CLEVIDIPINE BUTYRATE 0.5 MG/ML IV EMUL
0.0000 mg/h | INTRAVENOUS | Status: DC
  Filled 2017-04-25: qty 50

## 2017-04-25 MED ORDER — ORAL CARE MOUTH RINSE
15.0000 mL | Freq: Four times a day (QID) | OROMUCOSAL | Status: DC
  Administered 2017-04-25 – 2017-04-27 (×10): 15 mL via OROMUCOSAL

## 2017-04-25 MED ORDER — ALTEPLASE (STROKE) FULL DOSE INFUSION
0.9000 mg/kg | Freq: Once | INTRAVENOUS | Status: AC
  Administered 2017-04-25: 77 mg via INTRAVENOUS
  Filled 2017-04-25: qty 100

## 2017-04-25 MED ORDER — FENTANYL CITRATE (PF) 100 MCG/2ML IJ SOLN
50.0000 ug | INTRAMUSCULAR | Status: DC | PRN
  Administered 2017-04-25 – 2017-04-26 (×4): 50 ug via INTRAVENOUS
  Filled 2017-04-25 (×4): qty 2

## 2017-04-25 MED ORDER — HEMOSTATIC AGENTS (NO CHARGE) OPTIME
TOPICAL | Status: DC | PRN
  Administered 2017-04-25: 1 via TOPICAL

## 2017-04-25 MED ORDER — DOCUSATE SODIUM 50 MG/5ML PO LIQD
100.0000 mg | Freq: Two times a day (BID) | ORAL | Status: DC | PRN
  Administered 2017-04-26: 100 mg
  Filled 2017-04-25: qty 10

## 2017-04-25 MED ORDER — PHENOL 1.4 % MT LIQD
1.0000 | OROMUCOSAL | Status: DC | PRN
  Administered 2017-04-28: 1 via OROMUCOSAL
  Filled 2017-04-25: qty 177

## 2017-04-25 MED ORDER — PHENYLEPHRINE HCL 10 MG/ML IJ SOLN
INTRAMUSCULAR | Status: DC | PRN
  Administered 2017-04-25: 80 ug via INTRAVENOUS
  Administered 2017-04-25 (×2): 120 ug via INTRAVENOUS

## 2017-04-25 MED ORDER — DOPAMINE-DEXTROSE 1.6-5 MG/ML-% IV SOLN
INTRAVENOUS | Status: DC | PRN
  Administered 2017-04-25: 3 ug/kg/min via INTRAVENOUS

## 2017-04-25 MED ORDER — SODIUM CHLORIDE 0.9 % IV SOLN: Freq: Once | INTRAVENOUS | Status: DC

## 2017-04-25 MED ORDER — IOPAMIDOL (ISOVUE-300) INJECTION 61%
INTRAVENOUS | Status: AC
  Filled 2017-04-25: qty 150

## 2017-04-25 MED ORDER — LOSARTAN POTASSIUM 25 MG PO TABS
25.0000 mg | ORAL_TABLET | Freq: Every day | ORAL | Status: DC
  Filled 2017-04-25: qty 1

## 2017-04-25 MED ORDER — PROPOFOL 500 MG/50ML IV EMUL
INTRAVENOUS | Status: DC | PRN
  Administered 2017-04-25: 06:00:00
  Administered 2017-04-25: 30 ug/kg/min via INTRAVENOUS

## 2017-04-25 MED ORDER — PROPOFOL 1000 MG/100ML IV EMUL
0.0000 ug/kg/min | INTRAVENOUS | Status: DC
  Administered 2017-04-25: 25 ug/kg/min via INTRAVENOUS
  Filled 2017-04-25: qty 100

## 2017-04-25 MED ORDER — SODIUM CHLORIDE 0.9 % IV SOLN: 50.0000 mL | Freq: Once | INTRAVENOUS | Status: DC

## 2017-04-25 MED ORDER — FENTANYL CITRATE (PF) 250 MCG/5ML IJ SOLN
INTRAMUSCULAR | Status: DC | PRN
  Administered 2017-04-25 (×2): 25 ug via INTRAVENOUS

## 2017-04-25 MED ORDER — SUCCINYLCHOLINE CHLORIDE 20 MG/ML IJ SOLN
INTRAMUSCULAR | Status: DC | PRN
  Administered 2017-04-25: 140 mg via INTRAVENOUS

## 2017-04-25 MED ORDER — ALBUMIN HUMAN 5 % IV SOLN
INTRAVENOUS | Status: DC | PRN
  Administered 2017-04-25: 06:00:00 via INTRAVENOUS

## 2017-04-25 MED ORDER — BISACODYL 10 MG RE SUPP: 10.0000 mg | Freq: Every day | RECTAL | Status: DC | PRN

## 2017-04-25 MED ORDER — CEFAZOLIN SODIUM-DEXTROSE 2-4 GM/100ML-% IV SOLN
INTRAVENOUS | Status: AC
  Filled 2017-04-25: qty 100

## 2017-04-25 MED ORDER — SODIUM CHLORIDE 0.9 % IV SOLN
INTRAVENOUS | Status: DC
  Administered 2017-04-25 – 2017-04-27 (×5): via INTRAVENOUS

## 2017-04-25 MED ORDER — IOPAMIDOL (ISOVUE-370) INJECTION 76%
INTRAVENOUS | Status: AC
  Administered 2017-04-25: 100 mL
  Filled 2017-04-25: qty 100

## 2017-04-25 MED ORDER — PROPOFOL 1000 MG/100ML IV EMUL
0.0000 ug/kg/min | INTRAVENOUS | Status: DC
  Administered 2017-04-25: 35 ug/kg/min via INTRAVENOUS
  Administered 2017-04-25: 20 ug/kg/min via INTRAVENOUS
  Administered 2017-04-25: 25 ug/kg/min via INTRAVENOUS
  Administered 2017-04-26: 10 ug/kg/min via INTRAVENOUS
  Administered 2017-04-26: 35 ug/kg/min via INTRAVENOUS
  Filled 2017-04-25 (×4): qty 100

## 2017-04-25 MED ORDER — ORAL CARE MOUTH RINSE: 15.0000 mL | Freq: Four times a day (QID) | OROMUCOSAL | Status: DC

## 2017-04-25 MED ORDER — GELATIN ABSORBABLE 12-7 MM EX MISC
CUTANEOUS | Status: AC
  Filled 2017-04-25: qty 1

## 2017-04-25 MED ORDER — ATORVASTATIN CALCIUM 80 MG PO TABS
80.0000 mg | ORAL_TABLET | Freq: Every day | ORAL | Status: DC
  Administered 2017-04-25 – 2017-04-27 (×3): 80 mg
  Filled 2017-04-25 (×3): qty 1

## 2017-04-25 MED ORDER — PROPOFOL 1000 MG/100ML IV EMUL
0.0000 ug/kg/min | INTRAVENOUS | Status: DC
  Filled 2017-04-25: qty 100

## 2017-04-25 MED ORDER — DOPAMINE-DEXTROSE 1.6-5 MG/ML-% IV SOLN
INTRAVENOUS | Status: DC | PRN
  Administered 2017-04-25: 5 ug/kg/min via INTRAVENOUS

## 2017-04-25 MED ORDER — EPTIFIBATIDE 20 MG/10ML IV SOLN
INTRAVENOUS | Status: AC
  Filled 2017-04-25: qty 10

## 2017-04-25 MED ORDER — FENTANYL CITRATE (PF) 250 MCG/5ML IJ SOLN
INTRAMUSCULAR | Status: AC
  Filled 2017-04-25: qty 5

## 2017-04-25 MED ORDER — PANTOPRAZOLE SODIUM 40 MG IV SOLR
40.0000 mg | Freq: Every day | INTRAVENOUS | Status: DC
  Administered 2017-04-25 – 2017-04-29 (×5): 40 mg via INTRAVENOUS
  Filled 2017-04-25 (×7): qty 40

## 2017-04-25 MED ORDER — HEPARIN SODIUM (PORCINE) 1000 UNIT/ML IJ SOLN
INTRAMUSCULAR | Status: DC | PRN
  Administered 2017-04-25: 8500 [IU] via INTRAVENOUS

## 2017-04-25 MED ORDER — MAGNESIUM SULFATE 2 GM/50ML IV SOLN
2.0000 g | Freq: Every day | INTRAVENOUS | Status: DC | PRN
  Filled 2017-04-25: qty 50

## 2017-04-25 MED ORDER — DOCUSATE SODIUM 100 MG PO CAPS
100.0000 mg | ORAL_CAPSULE | Freq: Every day | ORAL | Status: DC
  Administered 2017-04-27 – 2017-05-05 (×9): 100 mg via ORAL
  Filled 2017-04-25 (×10): qty 1

## 2017-04-25 MED ORDER — NITROGLYCERIN 1 MG/10 ML FOR IR/CATH LAB
INTRA_ARTERIAL | Status: AC
  Filled 2017-04-25: qty 10

## 2017-04-25 MED ORDER — DOPAMINE-DEXTROSE 3.2-5 MG/ML-% IV SOLN
0.0000 ug/kg/min | INTRAVENOUS | Status: DC
Start: 2017-04-25 — End: 2017-04-25
  Filled 2017-04-25: qty 250

## 2017-04-25 MED ORDER — GUAIFENESIN-DM 100-10 MG/5ML PO SYRP
15.0000 mL | ORAL_SOLUTION | ORAL | Status: DC | PRN
  Administered 2017-04-29 – 2017-05-05 (×12): 15 mL via ORAL
  Filled 2017-04-25 (×13): qty 15

## 2017-04-25 MED ORDER — MORPHINE SULFATE (PF) 2 MG/ML IV SOLN: 2.0000 mg | INTRAVENOUS | Status: DC | PRN

## 2017-04-25 MED ORDER — SODIUM CHLORIDE 0.9 % IV SOLN
INTRAVENOUS | Status: DC | PRN
  Administered 2017-04-25: 02:00:00 via INTRAVENOUS

## 2017-04-25 MED ORDER — PHENYLEPHRINE HCL 10 MG/ML IJ SOLN
INTRAMUSCULAR | Status: DC | PRN
  Administered 2017-04-25: 10 ug/min via INTRAVENOUS
  Administered 2017-04-25: 20 ug/min via INTRAVENOUS

## 2017-04-25 SURGICAL SUPPLY — 75 items
BALLN NEPHROSTOMY (BALLOONS) ×5
BALLOON NEPHROSTOMY (BALLOONS) ×3 IMPLANT
BANDAGE ACE 4X5 VEL STRL LF (GAUZE/BANDAGES/DRESSINGS) IMPLANT
BANDAGE ESMARK 6X9 LF (GAUZE/BANDAGES/DRESSINGS) IMPLANT
BNDG ESMARK 6X9 LF (GAUZE/BANDAGES/DRESSINGS)
CANISTER SUCT 3000ML PPV (MISCELLANEOUS) ×5 IMPLANT
CATH EMB 3FR 80CM (CATHETERS) ×10 IMPLANT
CATH EMB 4FR 80CM (CATHETERS) ×5 IMPLANT
CATH EMB 5FR 80CM (CATHETERS) ×5 IMPLANT
CATH FOLEY 2WAY SLVR  5CC 14FR (CATHETERS) ×2
CATH FOLEY 2WAY SLVR 5CC 14FR (CATHETERS) ×3 IMPLANT
CATH TIEMANN FOLEY 18FR 5CC (CATHETERS) ×5 IMPLANT
CLIP VESOCCLUDE MED 24/CT (CLIP) ×5 IMPLANT
CLIP VESOCCLUDE SM WIDE 24/CT (CLIP) ×5 IMPLANT
COVER PROBE W GEL 5X96 (DRAPES) ×5 IMPLANT
CUFF TOURNIQUET SINGLE 24IN (TOURNIQUET CUFF) IMPLANT
CUFF TOURNIQUET SINGLE 34IN LL (TOURNIQUET CUFF) IMPLANT
CUFF TOURNIQUET SINGLE 44IN (TOURNIQUET CUFF) IMPLANT
DRAIN CHANNEL 15F RND FF W/TCR (WOUND CARE) ×5 IMPLANT
DRAPE C-ARM 42X72 X-RAY (DRAPES) ×5 IMPLANT
DRAPE HALF SHEET 40X57 (DRAPES) ×5 IMPLANT
DRSG COVADERM 4X8 (GAUZE/BANDAGES/DRESSINGS) ×5 IMPLANT
ELECT REM PT RETURN 9FT ADLT (ELECTROSURGICAL) ×5
ELECTRODE REM PT RTRN 9FT ADLT (ELECTROSURGICAL) ×3 IMPLANT
EVACUATOR SILICONE 100CC (DRAIN) ×5 IMPLANT
GAUZE SPONGE 4X4 12PLY STRL LF (GAUZE/BANDAGES/DRESSINGS) ×5 IMPLANT
GLOVE BIO SURGEON STRL SZ 6.5 (GLOVE) ×4 IMPLANT
GLOVE BIO SURGEON STRL SZ7 (GLOVE) ×10 IMPLANT
GLOVE BIO SURGEON STRL SZ7.5 (GLOVE) ×5 IMPLANT
GLOVE BIO SURGEON STRL SZ8 (GLOVE) ×15 IMPLANT
GLOVE BIO SURGEONS STRL SZ 6.5 (GLOVE) ×1
GLOVE BIOGEL PI IND STRL 6.5 (GLOVE) ×3 IMPLANT
GLOVE BIOGEL PI IND STRL 7.0 (GLOVE) ×3 IMPLANT
GLOVE BIOGEL PI IND STRL 7.5 (GLOVE) ×6 IMPLANT
GLOVE BIOGEL PI INDICATOR 6.5 (GLOVE) ×2
GLOVE BIOGEL PI INDICATOR 7.0 (GLOVE) ×2
GLOVE BIOGEL PI INDICATOR 7.5 (GLOVE) ×4
GOWN STRL REUS W/ TWL LRG LVL3 (GOWN DISPOSABLE) ×9 IMPLANT
GOWN STRL REUS W/TWL LRG LVL3 (GOWN DISPOSABLE) ×6
GUIDEWIRE ANG ZIPWIRE 038X150 (WIRE) ×5 IMPLANT
GUIDEWIRE STR DUAL SENSOR (WIRE) ×5 IMPLANT
HEMOSTAT SPONGE AVITENE ULTRA (HEMOSTASIS) ×5 IMPLANT
INSERT FOGARTY SM (MISCELLANEOUS) IMPLANT
KIT BASIN OR (CUSTOM PROCEDURE TRAY) ×5 IMPLANT
KIT ROOM TURNOVER OR (KITS) ×5 IMPLANT
MARKER GRAFT CORONARY BYPASS (MISCELLANEOUS) IMPLANT
NS IRRIG 1000ML POUR BTL (IV SOLUTION) ×10 IMPLANT
PACK PERIPHERAL VASCULAR (CUSTOM PROCEDURE TRAY) ×5 IMPLANT
PAD ARMBOARD 7.5X6 YLW CONV (MISCELLANEOUS) ×10 IMPLANT
PATCH VASC XENOSURE 1CMX6CM (Vascular Products) ×2 IMPLANT
PATCH VASC XENOSURE 1X6 (Vascular Products) ×3 IMPLANT
SET CYSTO W/LG BORE CLAMP LF (SET/KITS/TRAYS/PACK) ×5 IMPLANT
SET MICROPUNCTURE 5F STIFF (MISCELLANEOUS) IMPLANT
STAPLER VISISTAT 35W (STAPLE) ×5 IMPLANT
STOPCOCK 4 WAY LG BORE MALE ST (IV SETS) IMPLANT
SURGILUBE 2OZ TUBE FLIPTOP (MISCELLANEOUS) ×5 IMPLANT
SUT ETHILON 3 0 PS 1 (SUTURE) ×5 IMPLANT
SUT GORETEX 5 0 TT13 24 (SUTURE) IMPLANT
SUT GORETEX 6.0 TT13 (SUTURE) IMPLANT
SUT MNCRL AB 4-0 PS2 18 (SUTURE) ×15 IMPLANT
SUT PROLENE 5 0 C 1 24 (SUTURE) ×5 IMPLANT
SUT PROLENE 6 0 BV (SUTURE) ×15 IMPLANT
SUT PROLENE 7 0 BV 1 (SUTURE) IMPLANT
SUT SILK 2 0 FS (SUTURE) IMPLANT
SUT SILK 3 0 (SUTURE)
SUT SILK 3-0 18XBRD TIE 12 (SUTURE) IMPLANT
SUT VIC AB 2-0 CT1 27 (SUTURE) ×4
SUT VIC AB 2-0 CT1 TAPERPNT 27 (SUTURE) ×6 IMPLANT
SUT VIC AB 3-0 SH 27 (SUTURE) ×6
SUT VIC AB 3-0 SH 27X BRD (SUTURE) ×9 IMPLANT
TRAY FOLEY W/METER SILVER 16FR (SET/KITS/TRAYS/PACK) ×5 IMPLANT
TUBING EXTENTION W/L.L. (IV SETS) IMPLANT
UNDERPAD 30X30 (UNDERPADS AND DIAPERS) ×5 IMPLANT
WATER STERILE IRR 1000ML POUR (IV SOLUTION) ×5 IMPLANT
WATER STERILE IRR 1000ML UROMA (IV SOLUTION) ×5 IMPLANT

## 2017-04-25 NOTE — Procedures (Signed)
Neuro-Interventional Radiology Post Cerebral Angiogram Procedure Note  History: 71 yo male admitted with acute CHF 1 day prior.  He was recognized to be unresponsive tonight, approximately midnight.  Baseline mRS:  0 NIHSS:   13 Last Known Well:  10:30pm Arrival Time:   inpt ASPECTS:   9 Anesthesia    GETA Skin Puncture:   02:04   First Pass Date & Time: 02:29 IA tPA:    No IA Medication:  No Proximal or Distal:  Prox M1 Post TICI Score:  TICI 3 Skin Puncture to Final: 25min Device:   Solitaire 4x40  Procedure:  US guided right CFA access.  Cerebral Angiogram Right M1 occlusion thrombectomy.   Findings:  Right M1 occlusion.  TICI 0 start, TICI 3 finish   Failure of right CFA proglide device, with compression dressing in place.   Absent pedal pulses at the conclusion of the case.  Discussed with ICU and Vascular Surgery on-call.   Complications: None.   Recommendations: - Admit to neuro ICU. Bed assignment is 16M-07 - To CT now for head CT.  - Pressure dressing in place at right hip.  Frequent checks at the hip for hematoma.  - Vent management per ICU, thank you - Right leg straight for 6 hours  - Given the discovered right foot absent pulses, will have CTA runoff performed.  Differential would include complication after access vs sequela of known embolic event.  Discussed with Dr. Imogene Burnhen of Vascular Surgery.     Signed,  Yvone NeuJaime S. Loreta AveWagner, DO

## 2017-04-25 NOTE — Telephone Encounter (Signed)
Pt currently in hospital.

## 2017-04-25 NOTE — Transfer of Care (Signed)
Immediate Anesthesia Transfer of Care Note  Patient: [name]  Procedure(s) Performed: Procedure(s): RADIOLOGY WITH ANESTHESIA Code Stroke (N/A)  Patient Location: Radiology  Anesthesia Type:General  Level of Consciousness: Patient remains intubated per anesthesia plan  Airway & Oxygen Therapy: Patient remains intubated per anesthesia plan and Patient placed on Ventilator (see vital sign flow sheet for setting)  Post-op Assessment: Report given to RN and Post -op Vital signs reviewed and stable  Post vital signs: Reviewed and stable  Last Vitals:  Vitals:   04/24/17 2134 04/25/17 0012  BP: 108/76 116/78  Pulse: 80 97  Resp: 20 18  Temp: 36.4 C (!) 36.4 C  SpO2: 98% 99%    Last Pain:  Vitals:   04/25/17 0012  TempSrc: Oral  PainSc:          Complications: No apparent anesthesia complications

## 2017-04-25 NOTE — Progress Notes (Signed)
NeuroInterventional Radiology Pre-Procedure Note  History: 71 yo male admitted with acute CHF 1 day prior.  He was recognized to be unresponsive tonight, approximately midnight.    Baseline mRS: 0 . NIHSS:   13.  CT ASPECTS: 9. CTA:    Right M1 occlusion, left CCA occlusion, likely chronic CTP:   No perfusion performed   Given the patient's symptoms, imaging findings, baseline function, I believe he is an appropriate candidate for mechanical thrombectomy.   In particular he has a left CCA occlusion, with compromised flow.  He certainly will have difficulty maintaining collateral flow to the right hemisphere.   The risks and benefits of the procedure were discussed with the patient/patient's family, with specific risks including: bleeding, infection, arterial injury/dissection, contrast reaction, kidney injury, need for further procedure/surgery, neurologic deficit, 10-15% risk of intracranial hemorrhage, cardiopulmonary collapse, death. All questions were answered.  The patient/family would like to proceed with attempt at thrombectomy.   Plan for cerebral angiogram and attempt at mechanical thrombectomy.   Signed,  Yvone NeuJaime S. Loreta AveWagner, DO

## 2017-04-25 NOTE — Progress Notes (Signed)
Initial Nutrition Assessment  DOCUMENTATION CODES:   Not applicable  INTERVENTION:  If pt to remain intubated >/= 24 hours, recommend Vital 1.2 @ 45 mL/hr with 30 mL Prostat BID. This regimen + kcal from current Propofol rate will provide 1773 kcal (98% estimated kcal need), 111 grams of protein, and 876 mL free water.  NUTRITION DIAGNOSIS:   Inadequate oral intake related to inability to eat as evidenced by NPO status.  GOAL:   Patient will meet greater than or equal to 90% of their needs  MONITOR:   Vent status, Weight trends, Labs, Skin  REASON FOR ASSESSMENT:   Ventilator, Malnutrition Screening Tool\  ASSESSMENT:   71 yo male developed shortness of breath, facial droop, Lt side weakness.  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  Given tPA, intubated for airway protection, and had thrombectomy of Rt M1.  Developed Rt leg ischemia after procedure likely from embolic event.  BMI indicates overweight status. Pt is NPO with OGT in place. He is s/p fem pop this AM. He remains intubated since procedure. Spoke with daughter, who is at bedside. She states that pt has had a poor appetite for "a long time" but is unable to be more specific about time frame. She states that in the few days PTA appetite and intakes actually improved and pt ate very well for dinner last night. She denies pt ever mentioning abdominal pain or nausea with or without PO intakes PTA.   Physical assessment shows no muscle or fat wasting at this time, no edema. Per cahrt review, Weight from 9/2-9/6 was +3 lbs/1.5 kg. Daughter unsure of UBW or recent weight changes and no weight in chart before 9/2.  Patient is currently intubated on ventilator support MV: 9.2 L/min Temp (24hrs), Avg:97.5 F (36.4 C), Min:96.7 F (35.9 C), Max:98 F (36.7 C) Propofol: 10.5 ml/hr (277 kcal from fat) BP: 93/69 and MAP: 78  Medications reviewed; sliding scale Novolog, 40 mg IV Protonix/day.  Labs  reviewed; CBGs: 244 and 238 mg/dL, Na: 742128 mmol/L, Ca: 8.2 mg/dL.  IVF: NS @ 100 mL/hr.  Drip: Propofol @ 20 mcg/kg/min.    Diet Order:  Diet NPO time specified  Skin:  Wound (see comment) (R leg incision, R 3rd toe DM ulcer)  Last BM:  9/5  Height:   Ht Readings from Last 1 Encounters:  04/25/17 6' (1.829 m)    Weight:   Wt Readings from Last 1 Encounters:  04/25/17 193 lb 4.8 oz (87.7 kg)    Ideal Body Weight:  80.91 kg  BMI:  Body mass index is 26.22 kg/m.  Estimated Nutritional Needs:   Kcal:  1815  Protein:  105-130 grams (1.2-1.5 grams/kg)  Fluid:  >/= 1.8 L/day  EDUCATION NEEDS:   No education needs identified at this time    Trenton GammonJessica Brandis Matsuura, MS, RD, LDN, CNSC Inpatient Clinical Dietitian Pager # [phone] After hours/weekend pager # [phone]

## 2017-04-25 NOTE — Consult Note (Signed)
PULMONARY / CRITICAL CARE MEDICINE   Name: [name] MRN: [id number] DOB: February 08, 1946    ADMISSION DATE:  04/24/2017 CONSULTATION DATE:  04/25/17  REFERRING MD:  Dr. Earleen Newport  CHIEF COMPLAINT:  Vent management  HISTORY OF PRESENT ILLNESS:  HPI obtained from medical chart review as patient is intubated and sedated. 71 year old male, [place] speaking only, with PMH of systolic HF, CAD, MI s/p CABG 2007, HTN, HLD, DMT2, prior prostate surgery, and CVA (2-3 yrs ago 2/2 HTN) admitted on 9/6 acute on chronic systolic HF.  Patient with recent admission 8/30-9/2 for acute on chronic systolic HF.  TTE on 8/31 with EF of 20-25% w/mild AR and MR, and CTA chest neg for PE.  No prior anti-coagulation use.  He developed shortness of breath on 9/5 with orthopnea.  He was admitted by Cardiology.  On 9/7 at [facility], he was found with facial drooping, left sided weakness and drooling.  LSW at 2230.  Code stroke initiated.  Head Ct and CTA c/w embolic R MCA occlusion,thought to be due to low EF. Given tPA, intubated for airway protection and taken to IR for Right M1 occlusion thrombectomy.  Post-procedure and sheath removal, patient found to have cooler right leg and absent right DP.  Taken for CTA runoff and Vascular consulted.  Additionally, foley catheter unable to be passed prostate.  Patient returned to ICU, PCCM consulted for vent management.   PAST MEDICAL HISTORY :  He  has a past medical history of CAD (coronary artery disease) (2007); CHF (congestive heart failure) ([place]); Diabetes mellitus without complication ([place]); Hyperlipidemia; Hypertension; Pulmonary embolism ([place]); and Stroke ([place]).  PAST SURGICAL HISTORY: He  has a past surgical history that includes Prostate surgery (2002) and Cardiac surgery.  No Known Allergies  No current facility-administered medications on file prior to encounter.    Current Outpatient Prescriptions on File Prior to Encounter  Medication Sig  . acetaminophen (TYLENOL) 650  MG CR tablet Take 650 mg by mouth at bedtime. FOR LEG PAIN  . aspirin EC 325 MG tablet Take 1 tablet (325 mg total) by mouth daily.  Marland Kitchen atorvastatin (LIPITOR) 80 MG tablet Take 1 tablet (80 mg total) by mouth daily at 6 PM.  . carvedilol (COREG) 3.125 MG tablet Take 1 tablet (3.125 mg total) by mouth 2 (two) times daily with a meal.  . feeding supplement, GLUCERNA SHAKE, (GLUCERNA SHAKE) LIQD Take 237 mLs by mouth 2 (two) times daily between meals.  . furosemide (LASIX) 20 MG tablet Take 1 tablet (20 mg total) by mouth daily.  . insulin glargine (LANTUS) 100 UNIT/ML injection Inject 0.05 mLs (5 Units total) into the skin at bedtime.  Marland Kitchen losartan (COZAAR) 25 MG tablet Take 1 tablet (25 mg total) by mouth daily.  . meloxicam (MOBIC) 7.5 MG tablet Take 2 tablets (15 mg total) by mouth daily. (Patient taking differently: Take 7.5 mg by mouth 2 (two) times daily. )  . metFORMIN (GLUCOPHAGE) 1000 MG tablet Take 1 tablet (1,000 mg total) by mouth daily with breakfast.  . Blood Glucose Monitoring Suppl (TRUE METRIX METER) DEVI 1 kit by Does not apply route 4 (four) times daily.  Marland Kitchen glucose blood (TRUE METRIX BLOOD GLUCOSE TEST) test strip Use as instructed  . TRUEPLUS LANCETS 28G MISC 28 g by Does not apply route 4 (four) times daily.    FAMILY HISTORY:  His indicated that his mother is deceased. He indicated that his father is deceased. He indicated that his sister is alive.  SOCIAL HISTORY: He  reports that he has never smoked. He has never used smokeless tobacco. He reports that he does not drink alcohol or use drugs.  REVIEW OF SYSTEMS:   unable  SUBJECTIVE:  On propofol at 15 mcg/kg/min RN performing bladder scan- unable to assess  VITAL SIGNS: BP 128/87   Pulse 68   Temp (!) 97.5 F (36.4 C) (Oral)   Resp 18   Ht 6' (1.829 m)   Wt 187 lb 8 oz (85 kg)   SpO2 100%   BMI 25.43 kg/m   HEMODYNAMICS:   VENTILATOR SETTINGS: Vent Mode: PRVC FiO2 (%):  [60 %] 60 % Set Rate:  [12  bmp] 12 bmp Vt Set:  [550 mL] 550 mL PEEP:  [5 cmH20] 5 cmH20 Plateau Pressure:  [19 cmH20] 19 cmH20  INTAKE / OUTPUT: I/O last 3 completed shifts: In: -  Out: 550 [Urine:550]  PHYSICAL EXAMINATION: General:  AA male lying in bed on MV in NAD HEENT: MM pink/moist, ETT/ OGT, PERRL, scleral anicteric  Neuro: Sedated, moving right side spont, flicker on left w/pain CV: rrr, no murmur PULM: even/non-labored on MV, lungs bilaterally coarse, no wheeze GI: soft, bs active Extremities: Large pressure dressing to right groin, surrounding tissue soft, right leg colder than left, faint DP +, no edema, right foot in post-op shoe- edematous toes Skin: no rashes or lesions  LABS:  BMET  Recent Labs Lab 04/20/17 0605 04/22/17 1321 04/24/17 1000  NA 134* 129* 126*  K 4.2 4.7 4.8  CL 99* 99* 96*  CO2 26 20* 21*  BUN '20 19 19  ' CREATININE 1.11 1.11 1.05  GLUCOSE 171* 140* 173*    Electrolytes  Recent Labs Lab 04/20/17 0605 04/22/17 1321 04/24/17 1000  CALCIUM 9.3 9.3 9.0    CBC  Recent Labs Lab 04/22/17 1321 04/24/17 1000  WBC 7.9 6.9  HGB 13.1 13.1  HCT 36.9* 35.6*  PLT 267 248    Coag's  Recent Labs Lab 04/25/17 0100  APTT 34  INR 1.14    Sepsis Markers No results for input(s): LATICACIDVEN, PROCALCITON, O2SATVEN in the last 168 hours.  ABG No results for input(s): PHART, PCO2ART, PO2ART in the last 168 hours.  Liver Enzymes No results for input(s): AST, ALT, ALKPHOS, BILITOT, ALBUMIN in the last 168 hours.  Cardiac Enzymes  Recent Labs Lab 04/24/17 1000  TROPONINI 0.05*    Glucose  Recent Labs Lab 04/20/17 0741 04/20/17 1140 04/22/17 1320 04/24/17 1908 04/24/17 2130 04/25/17 0513  GLUCAP 185* 201* 142* 214* 167* 229*    Imaging Ct Angio Head W Or Wo Contrast  Result Date: 04/25/2017 CLINICAL DATA:  Code stroke.  71 y/o  M; left-sided weakness. EXAM: CT ANGIOGRAPHY HEAD AND NECK CT HEAD CODE STROKE WITHOUT CONTRAST TECHNIQUE:  Multidetector CT imaging of the head and neck was performed using the standard protocol during bolus administration of intravenous contrast. Multiplanar CT image reconstructions and MIPs were obtained to evaluate the vascular anatomy. Carotid stenosis measurements (when applicable) are obtained utilizing NASCET criteria, using the distal internal carotid diameter as the denominator. CONTRAST:  50 cc Isovue 370 COMPARISON:  None. FINDINGS: CT HEAD FINDINGS Brain: Small lucency in the right insula with loss of gray-white differentiation (series 3, image 13). Chronic lacunar infarction within the right superior thalamus. Moderate chronic microvascular ischemic changes of white matter and parenchymal volume loss of the brain. No acute intracranial hemorrhage identified. No hydrocephalus or effacement of basilar cisterns. Vascular: Right M1 hyperdensity. Extensive calcific  atherosclerosis of carotid siphons. Skull: Normal. Negative for fracture or focal lesion. Sinuses: Left maxillary sinus mucous retention cyst. Otherwise negative. Orbits: No acute finding. Review of the MIP images confirms the above findings CTA NECK FINDINGS Aortic arch: 4.2 cm ascending aortic aneurysm. Bovine variant anatomy. Right carotid system: No evidence of dissection, stenosis (50% or greater) or occlusion. The mild calcified plaque of the carotid bifurcation and proximal ICA with mild less than 50% stenosis of proximal ICA. 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 Reconstitution of the left internal carotid artery via external carotid collaterals with poor downstream enhancement of the left internal carotid artery to the terminus indicating slow flow. Vertebral arteries: Right V1 occlusion with reconstitution of the right vertebral artery at the V2 segment. Calcified plaque of left vertebral artery origin with severe stenosis. The left vertebral artery is otherwise normal in caliber through its course  in the neck. Skeleton: The cervical degenerative changes greatest at the C4-5 level were disc disease and a small disc bulge results in mild canal stenosis. Other neck: The mediastinal adenopathy in the lower paratracheal and right upper peritracheal stations, probably due to venous congestion from pulmonary edema. Upper chest: Small right pleural effusion. Smooth interlobular septal thickening compatible with interstitial pulmonary edema. Review of the MIP images confirms the above findings CTA HEAD FINDINGS Anterior circulation: Extensive calcified plaque of the right carotid siphon with a short segment of severe distal cavernous and second short segment of paraclinoid severe stenosis. Poor enhancement of the distal left cavernous and ophthalmic segments may be due to high-grade stenosis or poor antegrade flow due to left common carotid artery occlusion. Right mid M1 occlusion with poor downstream right MCA collateralization. Patent bilateral anterior cerebral arteries and left middle cerebral artery without significant stenosis. Posterior circulation: No significant stenosis, proximal occlusion, aneurysm, or vascular malformation. Mild proximal basilar stenosis. Short segments of mild stenosis in the bilateral P1 segments. Right V4 segment short segment of mild stenosis secondary to calcified plaque. Venous sinuses: As permitted by contrast timing, patent. Anatomic variants: Small anterior communicating artery. No posterior communicating artery identified, likely hypoplastic or absent. 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 ASPECTS = 9 right MCA infarction. No acute hemorrhage. CT head: 1. Small focus of hypoattenuation within right insula compatible with acute infarct, ASPECTS = 9. 2. Moderate chronic microvascular ischemic changes and moderate parenchymal volume loss of the  brain. 3. Small chronic lacunar infarct in right superior thalamus. CTA neck: 1. Left common carotid artery occlusion. Reconstitution of left internal carotid artery from external carotid artery collaterals. Poor enhancement of left internal carotid artery indicating slow flow. 2. No significant stenosis of the right carotid system in the neck. 3. Right V1 occlusion with reconstitution of right vertebral artery at the V2 segment. 4. Severe left vertebral artery origin stenosis secondary to calcified plaque. 5. 4.2 cm aortic aneurysm. Recommend annual imaging followup by CTA or MRA. This recommendation follows 2010 ACCF/AHA/AATS/ACR/ASA/SCA/SCAI/SIR/STS/SVM Guidelines for the Diagnosis and Management of Patients with Thoracic Aortic Disease. Circulation. 2010; 121: D326-Z124. 6. Interstitial pulmonary edema. Enlarged mediastinal lymph nodes is likely related to pulmonary edema. CTA head: 1. Right mid M1 occlusion. Poor downstream right MCA collateralization. 2. Poor enhancement of distal left cavernous and ophthalmic segments of ICA may be due to slow flow secondary to common carotid occlusion or underlying high-grade stenosis. 3. Intracranial  atherosclerosis with multiple areas of mild stenosis in the anterior and posterior circulation. 4. No additional large vessel occlusion, aneurysm, or evidence for vascular malformation. These results were called by telephone at the time of interpretation on 04/25/2017 at 12:55 am to Dr. Roland Rack , who verbally acknowledged these results. Electronically Signed   By: Kristine Garbe M.D.   On: 04/25/2017 01:14   Dg Chest 2 View  Result Date: 04/24/2017 CLINICAL DATA:  Short of breath EXAM: CHEST  2 VIEW COMPARISON:  04/17/2017 FINDINGS: Cardiac enlargement without heart failure. Negative for infiltrate or effusion. Negative for mass or adenopathy. Mild right lower lobe atelectasis IMPRESSION: Cardiac enlargement without heart failure Mild right lower lobe  atelectasis. Electronically Signed   By: Franchot Gallo M.D.   On: 04/24/2017 10:34   Ct Head Wo Contrast  Result Date: 04/25/2017 CLINICAL DATA:  71 y/o M; post transarterial intervention of stroke. EXAM: CT HEAD WITHOUT CONTRAST TECHNIQUE: Contiguous axial images were obtained from the base of the skull through the vertex without intravenous contrast. COMPARISON:  04/25/2017 CT head FINDINGS: Brain: Stable small lucency within the right insula compatible with region of acute infarction. No new area of loss of gray-white differentiation to indicate interval infarction. No acute hemorrhage. Stable background of moderate chronic microvascular ischemic changes and parenchymal volume loss of the brain. No hydrocephalus, extra-axial collection, or effacement of basilar cisterns. Vascular: The persisting contrast opacification of the dural venous sinuses and circle of Willis. Skull: Normal. Negative for fracture or focal lesion. Sinuses/Orbits: The left maxillary sinus mucous retention cyst. Fluid within the nasal and oropharynx is likely due to intubation. Other: None. IMPRESSION: Stable small lucency within the right insula compatible with region of acute infarction. No new area of infarction identified. No acute hemorrhage. Electronically Signed   By: Kristine Garbe M.D.   On: 04/25/2017 04:44   Ct Angio Neck W Or Wo Contrast  Result Date: 04/25/2017 CLINICAL DATA:  Code stroke.  71 y/o  M; left-sided weakness. EXAM: CT ANGIOGRAPHY HEAD AND NECK CT HEAD CODE STROKE WITHOUT CONTRAST TECHNIQUE: Multidetector CT imaging of the head and neck was performed using the standard protocol during bolus administration of intravenous contrast. Multiplanar CT image reconstructions and MIPs were obtained to evaluate the vascular anatomy. Carotid stenosis measurements (when applicable) are obtained utilizing NASCET criteria, using the distal internal carotid diameter as the denominator. CONTRAST:  50 cc Isovue 370  COMPARISON:  None. FINDINGS: CT HEAD FINDINGS Brain: Small lucency in the right insula with loss of gray-white differentiation (series 3, image 13). Chronic lacunar infarction within the right superior thalamus. Moderate chronic microvascular ischemic changes of white matter and parenchymal volume loss of the brain. No acute intracranial hemorrhage identified. No hydrocephalus or effacement of basilar cisterns. Vascular: Right M1 hyperdensity. Extensive calcific atherosclerosis of carotid siphons. Skull: Normal. Negative for fracture or focal lesion. Sinuses: Left maxillary sinus mucous retention cyst. Otherwise negative. Orbits: No acute finding. Review of the MIP images confirms the above findings CTA NECK FINDINGS Aortic arch: 4.2 cm ascending aortic aneurysm. Bovine variant anatomy. Right carotid system: No evidence of dissection, stenosis (50% or greater) or occlusion. The mild calcified plaque of the carotid bifurcation and proximal ICA with mild less than 50% stenosis of proximal ICA. 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 Reconstitution of the left internal carotid artery via external carotid collaterals with poor downstream enhancement of the left internal carotid artery to the terminus indicating slow flow. Vertebral  arteries: Right V1 occlusion with reconstitution of the right vertebral artery at the V2 segment. Calcified plaque of left vertebral artery origin with severe stenosis. The left vertebral artery is otherwise normal in caliber through its course in the neck. Skeleton: The cervical degenerative changes greatest at the C4-5 level were disc disease and a small disc bulge results in mild canal stenosis. Other neck: The mediastinal adenopathy in the lower paratracheal and right upper peritracheal stations, probably due to venous congestion from pulmonary edema. Upper chest: Small right pleural effusion. Smooth interlobular septal thickening compatible with  interstitial pulmonary edema. Review of the MIP images confirms the above findings CTA HEAD FINDINGS Anterior circulation: Extensive calcified plaque of the right carotid siphon with a short segment of severe distal cavernous and second short segment of paraclinoid severe stenosis. Poor enhancement of the distal left cavernous and ophthalmic segments may be due to high-grade stenosis or poor antegrade flow due to left common carotid artery occlusion. Right mid M1 occlusion with poor downstream right MCA collateralization. Patent bilateral anterior cerebral arteries and left middle cerebral artery without significant stenosis. Posterior circulation: No significant stenosis, proximal occlusion, aneurysm, or vascular malformation. Mild proximal basilar stenosis. Short segments of mild stenosis in the bilateral P1 segments. Right V4 segment short segment of mild stenosis secondary to calcified plaque. Venous sinuses: As permitted by contrast timing, patent. Anatomic variants: Small anterior communicating artery. No posterior communicating artery identified, likely hypoplastic or absent. 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 ASPECTS = 9 right MCA infarction. No acute hemorrhage. CT head: 1. Small focus of hypoattenuation within right insula compatible with acute infarct, ASPECTS = 9. 2. Moderate chronic microvascular ischemic changes and moderate parenchymal volume loss of the brain. 3. Small chronic lacunar infarct in right superior thalamus. CTA neck: 1. Left common carotid artery occlusion. Reconstitution of left internal carotid artery from external carotid artery collaterals. Poor enhancement of left internal carotid artery indicating slow flow. 2. No significant stenosis of the right carotid system in the neck. 3. Right V1 occlusion with reconstitution of right vertebral artery at  the V2 segment. 4. Severe left vertebral artery origin stenosis secondary to calcified plaque. 5. 4.2 cm aortic aneurysm. Recommend annual imaging followup by CTA or MRA. This recommendation follows 2010 ACCF/AHA/AATS/ACR/ASA/SCA/SCAI/SIR/STS/SVM Guidelines for the Diagnosis and Management of Patients with Thoracic Aortic Disease. Circulation. 2010; 121: N277-O242. 6. Interstitial pulmonary edema. Enlarged mediastinal lymph nodes is likely related to pulmonary edema. CTA head: 1. Right mid M1 occlusion. Poor downstream right MCA collateralization. 2. Poor enhancement of distal left cavernous and ophthalmic segments of ICA may be due to slow flow secondary to common carotid occlusion or underlying high-grade stenosis. 3. Intracranial atherosclerosis with multiple areas of mild stenosis in the anterior and posterior circulation. 4. No additional large vessel occlusion, aneurysm, or evidence for vascular malformation. These results were called by telephone at the time of interpretation on 04/25/2017 at 12:55 am to Dr. Roland Rack , who verbally acknowledged these results. Electronically Signed   By: Kristine Garbe M.D.   On: 04/25/2017 01:14   Ct Angio Ao+bifem W & Or Wo Contrast  Result Date: 04/25/2017 CLINICAL DATA:  71 year old male with a history of admission for congestive heart failure 24 hours prior to acute inpatient clinical stroke with emergent large vessel occlusion of the right middle cerebral artery. Status post treatment with both mechanical thrombectomy and  systemic, weight based dose of tPA, the right MCA flow has been restored, however, right foot pulses were discovered to be absent. Diagnostic CT imaging was performed to evaluate the right lower extremity arterial system. EXAM: CT ANGIOGRAPHY OF ABDOMINAL AORTA WITH ILIOFEMORAL RUNOFF TECHNIQUE: Multidetector CT imaging of the abdomen, pelvis and lower extremities was performed using the standard protocol during bolus  administration of intravenous contrast. Multiplanar CT image reconstructions and MIPs were obtained to evaluate the vascular anatomy. CONTRAST:  100 cc Isovue 3 7 COMPARISON:  Chest CT 04/18/2017 FINDINGS: VASCULAR Aorta: Atherosclerotic plaque of the abdominal aorta with mixed calcified and soft plaque. No aneurysm or dissection of the visualized lower abdominal aorta. IMA: Inferior mesenteric artery is patent. Right lower extremity: Mild soft and calcified plaque of the right iliac system. Right common iliac artery measures 13 mm. No dissection. Hypogastric artery is patent. Moderate atherosclerotic changes of the right hypogastric system. Right external iliac artery patent with mild atherosclerotic changes. Irregular intimal contour of the right common femoral artery with luminal narrowing, at the site of recent arterial access. Common femoral artery remains patent throughout its course to the bifurcation. Profunda femoris remains patent, as well as the thigh branches. There is proximal occlusion of the superficial femoral artery. Circumferential calcified plaque of the length of the superficial femoral artery with background of atherosclerotic changes extending through the popliteal artery. No contrast is visualized through the proximal 2/3 of the SFA. Collaterals partially fill the distal SFA and above knee popliteal artery. No contrast identified through the length of the tibial vessels. Baseline calcified disease present through the length of the tibial vessels including predominantly anterior tibial artery and posterior tibial artery. Left lower extremity: Mixed calcified and soft plaque of the left iliac system. Diameter of the left common iliac artery measures 12 mm. No dissection flap. Hypogastric arteries patent with moderate atherosclerotic changes. External iliac artery patent with mild plaque. Common femoral artery patent with mild plaque. Profunda femoris patent as well as the thigh branches.  Superficial femoral artery demonstrates mild to moderate calcified and soft plaque with no significant stenosis identified. Popliteal artery appears patent through its length with mild plaque. There is decrease contrast column within the distal popliteal artery, with decrease contrast filling of the tibial vessels. There does appear to be discontinuous contrast flow of the peroneal artery to the ankle, with questionable filling of the anterior tibial and posterior tibial vessels. Baseline calcified disease of the tibial vessels, predominantly anterior tibial artery and posterior tibial artery. Veins: Unremarkable appearance of the venous system. Review of the MIP images confirms the above findings. NON-VASCULAR Urinary tract: Incomplete visualization of the bilateral kidneys. Likely Bosniak 1 cyst at the inferior left kidney. Excreted contrast within the urinary bladder. Stomach/Bowel: Unremarkable appearance of visualized small bowel. Unremarkable appearance of the visualized colon. Lymphatic: Multiple lymph nodes in the para-aortic nodal station, none of which are enlarged. Mesenteric: No adenopathy. Reproductive: Prostate diameter not enlarged. Other: Gas within the anterior abdominal soft tissues, potentially secondary to injections. Compression dressing on the right common femoral region. Musculoskeletal: Degenerative changes of the lower lumbar spine. No displaced fracture. IMPRESSION: Occlusion of the right SFA just after the origin, with patent stump. There is minimal collateral filling of the distal SFA and popliteal artery via patent profunda femoris. There is also absent contrast flow within the right-sided tibial vasculature to the ankle. On the left, contrast flow is maintained through the popliteal artery, however, there is decrease contrast within the anterior tibial  artery and posterior tibial artery. There appears to be at least partial filling of the peroneal artery to the ankle. This may be  related to additional emboli versus baseline vascular disease. Moderate atherosclerotic changes of the bilateral iliac, femoral-popliteal, and tibial vasculature, with calcifications through the length of the right SFA, right popliteal artery, and bilateral tibial arteries. Irregularity of the intimal surface of the right common femoral artery, which is patent with mild narrowing, at the site of recent femoral arterial sheath access. No evidence of hematoma with compression dressing in place. Signed, Dulcy Fanny. Earleen Newport, DO [facility] Electronically Signed   By: Corrie Mckusick D.O.   On: 04/25/2017 05:06   Ct Head Code Stroke Wo Contrast  Result Date: 04/25/2017 CLINICAL DATA:  Code stroke.  71 y/o  M; left-sided weakness. EXAM: CT ANGIOGRAPHY HEAD AND NECK CT HEAD CODE STROKE WITHOUT CONTRAST TECHNIQUE: Multidetector CT imaging of the head and neck was performed using the standard protocol during bolus administration of intravenous contrast. Multiplanar CT image reconstructions and MIPs were obtained to evaluate the vascular anatomy. Carotid stenosis measurements (when applicable) are obtained utilizing NASCET criteria, using the distal internal carotid diameter as the denominator. CONTRAST:  50 cc Isovue 370 COMPARISON:  None. FINDINGS: CT HEAD FINDINGS Brain: Small lucency in the right insula with loss of gray-white differentiation (series 3, image 13). Chronic lacunar infarction within the right superior thalamus. Moderate chronic microvascular ischemic changes of white matter and parenchymal volume loss of the brain. No acute intracranial hemorrhage identified. No hydrocephalus or effacement of basilar cisterns. Vascular: Right M1 hyperdensity. Extensive calcific atherosclerosis of carotid siphons. Skull: Normal. Negative for fracture or focal lesion. Sinuses: Left maxillary sinus mucous retention cyst. Otherwise negative. Orbits: No acute finding.  Review of the MIP images confirms the above findings CTA NECK FINDINGS Aortic arch: 4.2 cm ascending aortic aneurysm. Bovine variant anatomy. Right carotid system: No evidence of dissection, stenosis (50% or greater) or occlusion. The mild calcified plaque of the carotid bifurcation and proximal ICA with mild less than 50% stenosis of proximal ICA. 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 Reconstitution of the left internal carotid artery via external carotid collaterals with poor downstream enhancement of the left internal carotid artery to the terminus indicating slow flow. Vertebral arteries: Right V1 occlusion with reconstitution of the right vertebral artery at the V2 segment. Calcified plaque of left vertebral artery origin with severe stenosis. The left vertebral artery is otherwise normal in caliber through its course in the neck. Skeleton: The cervical degenerative changes greatest at the C4-5 level were disc disease and a small disc bulge results in mild canal stenosis. Other neck: The mediastinal adenopathy in the lower paratracheal and right upper peritracheal stations, probably due to venous congestion from pulmonary edema. Upper chest: Small right pleural effusion. Smooth interlobular septal thickening compatible with interstitial pulmonary edema. Review of the MIP images confirms the above findings CTA HEAD FINDINGS Anterior circulation: Extensive calcified plaque of the right carotid siphon with a short segment of severe distal cavernous and second short segment of paraclinoid severe stenosis. Poor enhancement of the distal left cavernous and ophthalmic segments may be due to high-grade stenosis or poor antegrade flow due to left common carotid artery occlusion. Right mid M1 occlusion with poor downstream right MCA collateralization. Patent bilateral anterior cerebral arteries and left middle cerebral artery without significant stenosis. Posterior circulation:  No significant stenosis, proximal occlusion, aneurysm, or vascular malformation.  Mild proximal basilar stenosis. Short segments of mild stenosis in the bilateral P1 segments. Right V4 segment short segment of mild stenosis secondary to calcified plaque. Venous sinuses: As permitted by contrast timing, patent. Anatomic variants: Small anterior communicating artery. No posterior communicating artery identified, likely hypoplastic or absent. 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 ASPECTS = 9 right MCA infarction. No acute hemorrhage. CT head: 1. Small focus of hypoattenuation within right insula compatible with acute infarct, ASPECTS = 9. 2. Moderate chronic microvascular ischemic changes and moderate parenchymal volume loss of the brain. 3. Small chronic lacunar infarct in right superior thalamus. CTA neck: 1. Left common carotid artery occlusion. Reconstitution of left internal carotid artery from external carotid artery collaterals. Poor enhancement of left internal carotid artery indicating slow flow. 2. No significant stenosis of the right carotid system in the neck. 3. Right V1 occlusion with reconstitution of right vertebral artery at the V2 segment. 4. Severe left vertebral artery origin stenosis secondary to calcified plaque. 5. 4.2 cm aortic aneurysm. Recommend annual imaging followup by CTA or MRA. This recommendation follows 2010 ACCF/AHA/AATS/ACR/ASA/SCA/SCAI/SIR/STS/SVM Guidelines for the Diagnosis and Management of Patients with Thoracic Aortic Disease. Circulation. 2010; 121: T024-O973. 6. Interstitial pulmonary edema. Enlarged mediastinal lymph nodes is likely related to pulmonary edema. CTA head: 1. Right mid M1 occlusion. Poor downstream right MCA collateralization. 2. Poor enhancement of distal left cavernous and ophthalmic segments of ICA may be due to slow flow  secondary to common carotid occlusion or underlying high-grade stenosis. 3. Intracranial atherosclerosis with multiple areas of mild stenosis in the anterior and posterior circulation. 4. No additional large vessel occlusion, aneurysm, or evidence for vascular malformation. These results were called by telephone at the time of interpretation on 04/25/2017 at 12:55 am to Dr. Roland Rack , who verbally acknowledged these results. Electronically Signed   By: Kristine Garbe M.D.   On: 04/25/2017 01:14   STUDIES:  9/7 Head CT >> ASPECTS 9 9/7 CTA head/neck >> Right M1 occlusion, left CCA occlusion 9/7 cerebral angiogram and thrombectomy >> Right M1 occlusion 9/7 head CT/ CTA runoff right leg >> occlusion of right SFA, no evidence of hematoma in right groin  CULTURES: None  ANTIBIOTICS: None  SIGNIFICANT EVENTS: 9/6 Admit 9/7 Code stroke 0010, TPA/ IR  LINES/TUBES: PIV 9/7 ETT >> 9/7 OGT >> 9/7 left radial art line >> 9/7 right femoral sheath >> 9/7 (0255)  DISCUSSION: 83 y/oM admitted with acute on chronic systolic HF on 9/6.  Code stroke on 9/7 with left sided weakness and facial drooping, s/p TPA taken to IR for successful embolectomy that developed cooler right leg and absent pulses.    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 Hx HTN, HLD, MI, CAD s/p CABG 2007 P:  Tele monitoring Continue Aline BP goals per Neuro/ Neuro IR Cardiology following  Vascular consulted- Pt going emergently to OR   RENAL A:   At risk for AKI  Urinary retention, failed insertion of coude, hx prostate surgery P:   BMET/ mag now Unable to bladder scan Will need Urology consult, if foley unable to placed in OR   Strict I&Os, daily weights  Replace electrolytes as  indicated  GASTROINTESTINAL A:   No acute issues  P:   NPO for now PPI for SUP  HEMATOLOGIC A:   No acute issues P:  Stat CBC and trend S/p TPA Transfuse for Hgb <8 with cardiac hx   INFECTIOUS A:   No concerns for infection  P:   Monitor WBC/ fever curve  ENDOCRINE A:   DMT2   P:   CBG q 4  SSI   NEUROLOGIC A:   Acute R MCA CVA s/p TPA and Right M1 occlusion thrombectomy in IR  P:   RASS goal: 0/-1 Propofol gtt and fentanyl prn  Management and further imaging per Neuro/ Neuro IR   FAMILY  - Updates: No family at bedside.   - Inter-disciplinary family meet or Palliative Care meeting due by:  05/02/17  CCT 40 mins   Kennieth Rad, ACNP Pulmonary and [place] Pager: [phone]  04/25/2017, 5:16 AM

## 2017-04-25 NOTE — Progress Notes (Signed)
Results for [name], [name] (MRN [id number]) as of 04/25/2017 14:04  Ref. Range 04/24/2017 21:30 04/25/2017 00:56 04/25/2017 05:13 04/25/2017 10:03 04/25/2017 12:08  Glucose-Capillary Latest Ref Range: 65 - 99 mg/dL 811167 (H) 914183 (H) 782229 (H) 244 (H) 238 (H)  Noted that blood sugars have been greater than 180 mg/dl.  Recommend increasing Lantus to 10 units daily (87.7 kg X 0.2 units/kg = 17.54 units) And continuing the Novolog SENSITIVE correction scale every 4 hours while NPO and then TID & HS when eating.   Smith MinceKendra Annaliza Zia RN BSN CDE Diabetes Coordinator Pager: [phone]  8am-5pm

## 2017-04-25 NOTE — Consult Note (Signed)
Requested by:  Dr. Loreta AveWagner  Reason for consultation: right leg ischemia    History of Present Illness   [name] is a 71 y.o. (06/21/1946) male who presents with cc: shortness of breath on 04/24/17.  History is reconstructed from chart as patient was intubated and sedated.  Patient was admitted to Cardiology on 04/24/17 for acute decompensation of chronic heart failure.  Reportedly around midnight this patient has altered neurologic exam concerning for stroke.  The patient had Code Stroke activated and was given tPA and taken to IR for mechanical thrombectomy of R MCA via R femoral sheath.  Reportedly after removal of R sheath after Proglide failure, loss of right pedal pulses was noted.  Vascular was subsequently consulted. CTA abd/pelvis with BRo also ordered.   Past Medical History:  Diagnosis Date  . CAD (coronary artery disease) 2007   CABG 2007 in [place]  . CHF (congestive heart failure) ([name])   . Diabetes mellitus without complication ([name])   . Hyperlipidemia   . Hypertension   . Pulmonary embolism ([name])   . Stroke [facility]([name])     Past Surgical History:  Procedure Laterality Date  . CARDIAC SURGERY    . PROSTATE SURGERY  2002     Social History   Social History  . Marital status: Married    Spouse name: N/A  . Number of children: N/A  . Years of education: N/A   Occupational History  . Not on file.   Social History Main Topics  . Smoking status: Never Smoker  . Smokeless tobacco: Never Used  . Alcohol use No  . Drug use: No  . Sexual activity: Not on file   Other Topics Concern  . Not on file   Social History Narrative   Pt is from [name] and speaks [place]. He [place] in 02[place].   He has a daughter and son-in-law that speak [place].     Family History  Problem Relation Age of Onset  . Hypertension Mother   . Hypertension Sister     Current Facility-Administered Medications  Medication Dose Route Frequency Provider Last Rate Last Dose  . 0.9 %   sodium chloride infusion  250 mL Intravenous PRN Berton BonHammond, Janine, NP      . 0.9 %  sodium chloride infusion   Intravenous Continuous Rejeana BrockKirkpatrick, McNeill P, MD      . 0.9 %  sodium chloride infusion  50 mL Intravenous Once Rejeana BrockKirkpatrick, McNeill P, MD      . acetaminophen (TYLENOL) tablet 650 mg  650 mg Oral Q4H PRN Berton BonHammond, Janine, NP   650 mg at 04/24/17 1655  . aspirin EC tablet 81 mg  81 mg Oral Daily Berton BonHammond, Janine, NP   81 mg at 04/24/17 1655  . atorvastatin (LIPITOR) tablet 80 mg  80 mg Oral q1800 Berton BonHammond, Janine, NP   80 mg at 04/24/17 1911  . ceFAZolin (ANCEF) 2-4 GM/100ML-% IVPB           . DOPamine (INTROPIN) 800 mg in dextrose 5 % 250 mL (3.2 mg/mL) infusion  0-20 mcg/kg/min Intravenous Titrated Gilmer MorWagner, Jaime, DO      . feeding supplement (GLUCERNA SHAKE) (GLUCERNA SHAKE) liquid 237 mL  237 mL Oral TID BM Jake BatheSkains, Mark C, MD   237 mL at 04/24/17 2030  . fentaNYL (SUBLIMAZE) 100 MCG/2ML injection           . furosemide (LASIX) injection 40 mg  40 mg Intravenous Q12H Berton BonHammond, Janine, NP  40 mg at 04/24/17 1657  . gelatin adsorbable (GELFOAM/SURGIFOAM) 12-7 MM sponge 12-7 mm           . gelatin adsorbable (GELFOAM/SURGIFOAM) 12-7 MM sponge 12-7 mm           . lidocaine (XYLOCAINE) 2 % viscous mouth solution           . losartan (COZAAR) tablet 25 mg  25 mg Oral Daily Berton Bon, NP   25 mg at 04/24/17 1911  . ondansetron (ZOFRAN) injection 4 mg  4 mg Intravenous Q6H PRN Berton Bon, NP      . potassium chloride SA (K-DUR,KLOR-CON) CR tablet 20 mEq  20 mEq Oral BID Berton Bon, NP   20 mEq at 04/24/17 2141  . sodium chloride flush (NS) 0.9 % injection 3 mL  3 mL Intravenous Q12H Berton Bon, NP   3 mL at 04/24/17 2146  . sodium chloride flush (NS) 0.9 % injection 3 mL  3 mL Intravenous PRN Berton Bon, NP        No Known Allergies  REVIEW OF SYSTEMS (negative unless checked):   Cannot be obtained as intubated and sedated   Physical Examination     Vitals:    04/25/17 0400 04/25/17 0415 04/25/17 0430 04/25/17 0453  BP: 130/66 136/67 128/76 128/87  Pulse:  68  68  Resp: 12 18 18 18   Temp:      TempSrc:      SpO2: 100%  100% 100%  Weight:      Height:       Body mass index is 25.43 kg/m.  General intubated and sedated, WD, actively moving right arm against restraints  Head [place]/AT,    Ear/Nose/ Throat Nares without erythema or drainage, oropharynx cannot be evaluated as intubated  Eyes Reactive pupils, cannot test EOMI,   Neck Supple, mid-line trachea,    Pulmonary Sym exp, good B air movt, rales on B  Cardiac RRR, Nl S1, S2, no Murmurs, No rubs, No S3,S4  Vascular Vessel Right Left  Radial Palpable Palpable  Brachial Palpable Palpable  Carotid Palpable, No Bruit Palpable, No Bruit  Aorta Not palpable N/A  Femoral cannot palpated due to pressure dressing Palpable  Popliteal Not palpable Not palpable  PT Not palpable Not palpable  DP Not palpable Not palpable    Gastro- intestinal soft, non-distended, non-tender to palpation, No guarding or rebound, no HSM, no masses, no CVAT B, No palpable prominent aortic pulse,    Musculo- skeletal Moving R arm spontaneously against restraints, no other obvious movement, cannot get M/S exam as sedated, both feet cool  Neurologic Cannot be obtained as intubated and sedated  Psychiatric Cannot be obtained as intubated and sedated  Dermatologic See M/S exam for extremity exam, No rashes otherwise noted  Lymphatic  Palpable lymph nodes: None    Laboratory   CBC CBC Latest Ref Rng & Units 04/24/2017 04/22/2017 04/17/2017  WBC 4.0 - 10.5 K/uL 6.9 7.9 8.7  Hemoglobin 13.0 - 17.0 g/dL 16.1 09.6 04.5  Hematocrit 39.0 - 52.0 % 35.6(L) 36.9(L) 39.8  Platelets 150 - 400 K/uL 248 267 241    BMP BMP Latest Ref Rng & Units 04/24/2017 04/22/2017 04/20/2017  Glucose 65 - 99 mg/dL 409(W) 119(J) 478(G)  BUN 6 - 20 mg/dL 19 19 20   Creatinine 0.61 - 1.24 mg/dL 9.56 2.13 0.86  Sodium 135 - 145 mmol/L 126(L) 129(L)  134(L)  Potassium 3.5 - 5.1 mmol/L 4.8 4.7 4.2  Chloride 101 - 111  mmol/L 96(L) 99(L) 99(L)  CO2 22 - 32 mmol/L 21(L) 20(L) 26  Calcium 8.9 - 10.3 mg/dL 9.0 9.3 9.3    Coagulation Lab Results  Component Value Date   INR 1.14 04/25/2017   No results found for: PTT  Lipids    Component Value Date/Time   CHOL 158 04/19/2017 0644   TRIG 126 04/19/2017 0644   HDL 40 (L) 04/19/2017 0644   CHOLHDL 4.0 04/19/2017 0644   VLDL 25 04/19/2017 0644   LDLCALC 93 04/19/2017 0644    Radiology     Ct Angio Head W Or Wo Contrast  Result Date: 04/25/2017 CLINICAL DATA:  Code stroke.  71 y/o  M; left-sided weakness. EXAM: CT ANGIOGRAPHY HEAD AND NECK CT HEAD CODE STROKE WITHOUT CONTRAST TECHNIQUE: Multidetector CT imaging of the head and neck was performed using the standard protocol during bolus administration of intravenous contrast. Multiplanar CT image reconstructions and MIPs were obtained to evaluate the vascular anatomy. Carotid stenosis measurements (when applicable) are obtained utilizing NASCET criteria, using the distal internal carotid diameter as the denominator. CONTRAST:  50 cc Isovue 370 COMPARISON:  None. FINDINGS: CT HEAD FINDINGS Brain: Small lucency in the right insula with loss of gray-white differentiation (series 3, image 13). Chronic lacunar infarction within the right superior thalamus. Moderate chronic microvascular ischemic changes of white matter and parenchymal volume loss of the brain. No acute intracranial hemorrhage identified. No hydrocephalus or effacement of basilar cisterns. Vascular: Right M1 hyperdensity. Extensive calcific atherosclerosis of carotid siphons. Skull: Normal. Negative for fracture or focal lesion. Sinuses: Left maxillary sinus mucous retention cyst. Otherwise negative. Orbits: No acute finding. Review of the MIP images confirms the above findings CTA NECK FINDINGS Aortic arch: 4.2 cm ascending aortic aneurysm. Bovine variant anatomy. Right carotid system:  No evidence of dissection, stenosis (50% or greater) or occlusion. The mild calcified plaque of the carotid bifurcation and proximal ICA with mild less than 50% stenosis of proximal ICA. 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 Reconstitution of the left internal carotid artery via external carotid collaterals with poor downstream enhancement of the left internal carotid artery to the terminus indicating slow flow. Vertebral arteries: Right V1 occlusion with reconstitution of the right vertebral artery at the V2 segment. Calcified plaque of left vertebral artery origin with severe stenosis. The left vertebral artery is otherwise normal in caliber through its course in the neck. Skeleton: The cervical degenerative changes greatest at the C4-5 level were disc disease and a small disc bulge results in mild canal stenosis. Other neck: The mediastinal adenopathy in the lower paratracheal and right upper peritracheal stations, probably due to venous congestion from pulmonary edema. Upper chest: Small right pleural effusion. Smooth interlobular septal thickening compatible with interstitial pulmonary edema. Review of the MIP images confirms the above findings CTA HEAD FINDINGS Anterior circulation: Extensive calcified plaque of the right carotid siphon with a short segment of severe distal cavernous and second short segment of paraclinoid severe stenosis. Poor enhancement of the distal left cavernous and ophthalmic segments may be due to high-grade stenosis or poor antegrade flow due to left common carotid artery occlusion. Right mid M1 occlusion with poor downstream right MCA collateralization. Patent bilateral anterior cerebral arteries and left middle cerebral artery without significant stenosis. Posterior circulation: No significant stenosis, proximal occlusion, aneurysm, or vascular malformation. Mild proximal basilar stenosis. Short segments of mild stenosis in the bilateral P1  segments. Right V4 segment short segment of mild stenosis secondary to  calcified plaque. Venous sinuses: As permitted by contrast timing, patent. Anatomic variants: Small anterior communicating artery. No posterior communicating artery identified, likely hypoplastic or absent. 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 ASPECTS = 9 right MCA infarction. No acute hemorrhage. CT head: 1. Small focus of hypoattenuation within right insula compatible with acute infarct, ASPECTS = 9. 2. Moderate chronic microvascular ischemic changes and moderate parenchymal volume loss of the brain. 3. Small chronic lacunar infarct in right superior thalamus. CTA neck: 1. Left common carotid artery occlusion. Reconstitution of left internal carotid artery from external carotid artery collaterals. Poor enhancement of left internal carotid artery indicating slow flow. 2. No significant stenosis of the right carotid system in the neck. 3. Right V1 occlusion with reconstitution of right vertebral artery at the V2 segment. 4. Severe left vertebral artery origin stenosis secondary to calcified plaque. 5. 4.2 cm aortic aneurysm. Recommend annual imaging followup by CTA or MRA. This recommendation follows 2010 ACCF/AHA/AATS/ACR/ASA/SCA/SCAI/SIR/STS/SVM Guidelines for the Diagnosis and Management of Patients with Thoracic Aortic Disease. Circulation. 2010; 121: Z610-R604. 6. Interstitial pulmonary edema. Enlarged mediastinal lymph nodes is likely related to pulmonary edema. CTA head: 1. Right mid M1 occlusion. Poor downstream right MCA collateralization. 2. Poor enhancement of distal left cavernous and ophthalmic segments of ICA may be due to slow flow secondary to common carotid occlusion or underlying high-grade stenosis. 3. Intracranial atherosclerosis with multiple areas of mild stenosis in the anterior and  posterior circulation. 4. No additional large vessel occlusion, aneurysm, or evidence for vascular malformation. These results were called by telephone at the time of interpretation on 04/25/2017 at 12:55 am to Dr. Ritta Slot , who verbally acknowledged these results. Electronically Signed   By: Mitzi Hansen M.D.   On: 04/25/2017 01:14   Dg Chest 2 View  Result Date: 04/24/2017 CLINICAL DATA:  Short of breath EXAM: CHEST  2 VIEW COMPARISON:  04/17/2017 FINDINGS: Cardiac enlargement without heart failure. Negative for infiltrate or effusion. Negative for mass or adenopathy. Mild right lower lobe atelectasis IMPRESSION: Cardiac enlargement without heart failure Mild right lower lobe atelectasis. Electronically Signed   By: Marlan Palau M.D.   On: 04/24/2017 10:34   Ct Head Wo Contrast  Result Date: 04/25/2017 CLINICAL DATA:  71 y/o M; post transarterial intervention of stroke. EXAM: CT HEAD WITHOUT CONTRAST TECHNIQUE: Contiguous axial images were obtained from the base of the skull through the vertex without intravenous contrast. COMPARISON:  04/25/2017 CT head FINDINGS: Brain: Stable small lucency within the right insula compatible with region of acute infarction. No new area of loss of gray-white differentiation to indicate interval infarction. No acute hemorrhage. Stable background of moderate chronic microvascular ischemic changes and parenchymal volume loss of the brain. No hydrocephalus, extra-axial collection, or effacement of basilar cisterns. Vascular: The persisting contrast opacification of the dural venous sinuses and circle of Willis. Skull: Normal. Negative for fracture or focal lesion. Sinuses/Orbits: The left maxillary sinus mucous retention cyst. Fluid within the nasal and oropharynx is likely due to intubation. Other: None. IMPRESSION: Stable small lucency within the right insula compatible with region of acute infarction. No new area of infarction identified. No acute  hemorrhage. Electronically Signed   By: Mitzi Hansen M.D.   On: 04/25/2017 04:44   Ct Angio Neck W Or Wo Contrast  Result Date: 04/25/2017 CLINICAL DATA:  Code stroke.  71 y/o  M; left-sided weakness.  EXAM: CT ANGIOGRAPHY HEAD AND NECK CT HEAD CODE STROKE WITHOUT CONTRAST TECHNIQUE: Multidetector CT imaging of the head and neck was performed using the standard protocol during bolus administration of intravenous contrast. Multiplanar CT image reconstructions and MIPs were obtained to evaluate the vascular anatomy. Carotid stenosis measurements (when applicable) are obtained utilizing NASCET criteria, using the distal internal carotid diameter as the denominator. CONTRAST:  50 cc Isovue 370 COMPARISON:  None. FINDINGS: CT HEAD FINDINGS Brain: Small lucency in the right insula with loss of gray-white differentiation (series 3, image 13). Chronic lacunar infarction within the right superior thalamus. Moderate chronic microvascular ischemic changes of white matter and parenchymal volume loss of the brain. No acute intracranial hemorrhage identified. No hydrocephalus or effacement of basilar cisterns. Vascular: Right M1 hyperdensity. Extensive calcific atherosclerosis of carotid siphons. Skull: Normal. Negative for fracture or focal lesion. Sinuses: Left maxillary sinus mucous retention cyst. Otherwise negative. Orbits: No acute finding. Review of the MIP images confirms the above findings CTA NECK FINDINGS Aortic arch: 4.2 cm ascending aortic aneurysm. Bovine variant anatomy. Right carotid system: No evidence of dissection, stenosis (50% or greater) or occlusion. The mild calcified plaque of the carotid bifurcation and proximal ICA with mild less than 50% stenosis of proximal ICA. 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 Reconstitution of the left internal carotid artery via external carotid collaterals with poor downstream enhancement of the left internal carotid  artery to the terminus indicating slow flow. Vertebral arteries: Right V1 occlusion with reconstitution of the right vertebral artery at the V2 segment. Calcified plaque of left vertebral artery origin with severe stenosis. The left vertebral artery is otherwise normal in caliber through its course in the neck. Skeleton: The cervical degenerative changes greatest at the C4-5 level were disc disease and a small disc bulge results in mild canal stenosis. Other neck: The mediastinal adenopathy in the lower paratracheal and right upper peritracheal stations, probably due to venous congestion from pulmonary edema. Upper chest: Small right pleural effusion. Smooth interlobular septal thickening compatible with interstitial pulmonary edema. Review of the MIP images confirms the above findings CTA HEAD FINDINGS Anterior circulation: Extensive calcified plaque of the right carotid siphon with a short segment of severe distal cavernous and second short segment of paraclinoid severe stenosis. Poor enhancement of the distal left cavernous and ophthalmic segments may be due to high-grade stenosis or poor antegrade flow due to left common carotid artery occlusion. Right mid M1 occlusion with poor downstream right MCA collateralization. Patent bilateral anterior cerebral arteries and left middle cerebral artery without significant stenosis. Posterior circulation: No significant stenosis, proximal occlusion, aneurysm, or vascular malformation. Mild proximal basilar stenosis. Short segments of mild stenosis in the bilateral P1 segments. Right V4 segment short segment of mild stenosis secondary to calcified plaque. Venous sinuses: As permitted by contrast timing, patent. Anatomic variants: Small anterior communicating artery. No posterior communicating artery identified, likely hypoplastic or absent. Review of the MIP images confirms the above findings IMPRESSION: Large vessel occlusion of right middle cerebral artery mid M1 segment  with poor right MCA collateralization. ASPECTS = 9 right MCA infarction. No acute hemorrhage. CT head: 1. Small focus of hypoattenuation within right insula compatible with acute infarct, ASPECTS = 9. 2. Moderate chronic microvascular ischemic changes and moderate parenchymal volume loss of the brain. 3. Small chronic lacunar infarct in right superior thalamus. CTA neck: 1. Left common carotid artery occlusion. Reconstitution of left internal carotid artery from external carotid artery collaterals. Poor  enhancement of left internal carotid artery indicating slow flow. 2. No significant stenosis of the right carotid system in the neck. 3. Right V1 occlusion with reconstitution of right vertebral artery at the V2 segment. 4. Severe left vertebral artery origin stenosis secondary to calcified plaque. 5. 4.2 cm aortic aneurysm. Recommend annual imaging followup by CTA or MRA. This recommendation follows 2010 ACCF/AHA/AATS/ACR/ASA/SCA/SCAI/SIR/STS/SVM Guidelines for the Diagnosis and Management of Patients with Thoracic Aortic Disease. Circulation. 2010; 121: [id number]. 6. Interstitial pulmonary edema. Enlarged mediastinal lymph nodes is likely related to pulmonary edema. CTA head: 1. Right mid M1 occlusion. Poor downstream right MCA collateralization. 2. Poor enhancement of distal left cavernous and ophthalmic segments of ICA may be due to slow flow secondary to common carotid occlusion or underlying high-grade stenosis. 3. Intracranial atherosclerosis with multiple areas of mild stenosis in the anterior and posterior circulation. 4. No additional large vessel occlusion, aneurysm, or evidence for vascular malformation. These results were called by telephone at the time of interpretation on 04/25/2017 at 12:55 am to Dr. Ritta Slot , who verbally acknowledged these results. Electronically Signed   By: Mitzi Hansen M.D.   On: 04/25/2017 01:14   Ct Angio Ao+bifem W & Or Wo Contrast  Result Date:  04/25/2017 CLINICAL DATA:  71 year old male with a history of admission for congestive heart failure 24 hours prior to acute inpatient clinical stroke with emergent large vessel occlusion of the right middle cerebral artery. Status post treatment with both mechanical thrombectomy and systemic, weight based dose of tPA, the right MCA flow has been restored, however, right foot pulses were discovered to be absent. Diagnostic CT imaging was performed to evaluate the right lower extremity arterial system. EXAM: CT ANGIOGRAPHY OF ABDOMINAL AORTA WITH ILIOFEMORAL RUNOFF TECHNIQUE: Multidetector CT imaging of the abdomen, pelvis and lower extremities was performed using the standard protocol during bolus administration of intravenous contrast. Multiplanar CT image reconstructions and MIPs were obtained to evaluate the vascular anatomy. CONTRAST:  100 cc Isovue 3 7 COMPARISON:  Chest CT 04/18/2017 FINDINGS: VASCULAR Aorta: Atherosclerotic plaque of the abdominal aorta with mixed calcified and soft plaque. No aneurysm or dissection of the visualized lower abdominal aorta. IMA: Inferior mesenteric artery is patent. Right lower extremity: Mild soft and calcified plaque of the right iliac system. Right common iliac artery measures 13 mm. No dissection. Hypogastric artery is patent. Moderate atherosclerotic changes of the right hypogastric system. Right external iliac artery patent with mild atherosclerotic changes. Irregular intimal contour of the right common femoral artery with luminal narrowing, at the site of recent arterial access. Common femoral artery remains patent throughout its course to the bifurcation. Profunda femoris remains patent, as well as the thigh branches. There is proximal occlusion of the superficial femoral artery. Circumferential calcified plaque of the length of the superficial femoral artery with background of atherosclerotic changes extending through the popliteal artery. No contrast is visualized  through the proximal 2/3 of the SFA. Collaterals partially fill the distal SFA and above knee popliteal artery. No contrast identified through the length of the tibial vessels. Baseline calcified disease present through the length of the tibial vessels including predominantly anterior tibial artery and posterior tibial artery. Left lower extremity: Mixed calcified and soft plaque of the left iliac system. Diameter of the left common iliac artery measures 12 mm. No dissection flap. Hypogastric arteries patent with moderate atherosclerotic changes. External iliac artery patent with mild plaque. Common femoral artery patent with mild plaque. Profunda femoris patent as well as the thigh  branches. Superficial femoral artery demonstrates mild to moderate calcified and soft plaque with no significant stenosis identified. Popliteal artery appears patent through its length with mild plaque. There is decrease contrast column within the distal popliteal artery, with decrease contrast filling of the tibial vessels. There does appear to be discontinuous contrast flow of the peroneal artery to the ankle, with questionable filling of the anterior tibial and posterior tibial vessels. Baseline calcified disease of the tibial vessels, predominantly anterior tibial artery and posterior tibial artery. Veins: Unremarkable appearance of the venous system. Review of the MIP images confirms the above findings. NON-VASCULAR Urinary tract: Incomplete visualization of the bilateral kidneys. Likely Bosniak 1 cyst at the inferior left kidney. Excreted contrast within the urinary bladder. Stomach/Bowel: Unremarkable appearance of visualized small bowel. Unremarkable appearance of the visualized colon. Lymphatic: Multiple lymph nodes in the para-aortic nodal station, none of which are enlarged. Mesenteric: No adenopathy. Reproductive: Prostate diameter not enlarged. Other: Gas within the anterior abdominal soft tissues, potentially secondary to  injections. Compression dressing on the right common femoral region. Musculoskeletal: Degenerative changes of the lower lumbar spine. No displaced fracture. IMPRESSION: Occlusion of the right SFA just after the origin, with patent stump. There is minimal collateral filling of the distal SFA and popliteal artery via patent profunda femoris. There is also absent contrast flow within the right-sided tibial vasculature to the ankle. On the left, contrast flow is maintained through the popliteal artery, however, there is decrease contrast within the anterior tibial artery and posterior tibial artery. There appears to be at least partial filling of the peroneal artery to the ankle. This may be related to additional emboli versus baseline vascular disease. Moderate atherosclerotic changes of the bilateral iliac, femoral-popliteal, and tibial vasculature, with calcifications through the length of the right SFA, right popliteal artery, and bilateral tibial arteries. Irregularity of the intimal surface of the right common femoral artery, which is patent with mild narrowing, at the site of recent femoral arterial sheath access. No evidence of hematoma with compression dressing in place. Signed, Yvone Neu. Loreta Ave, DO [facility] Radiology Electronically Signed   By: Gilmer Mor D.O.   On: 04/25/2017 05:06   Dg Chest Port 1 View  Result Date: 04/25/2017 CLINICAL DATA:  Endotracheal intubation. EXAM: PORTABLE CHEST 1 VIEW COMPARISON:  04/24/2017 FINDINGS: Postoperative changes in the mediastinum. Endotracheal tube with tip measuring 3.5 cm above the carina. Enteric tube tip is off the field of view but below the left hemidiaphragm. Shallow inspiration. Cardiac enlargement. Mild developing pulmonary vascular congestion. Possible early perihilar edema. No blunting of costophrenic angles. No pneumothorax. IMPRESSION: Appliances appear in satisfactory location. Cardiac enlargement  with mild developing pulmonary vascular congestion and possible early perihilar edema. Electronically Signed   By: Burman Nieves M.D.   On: 04/25/2017 05:40   Ct Head Code Stroke Wo Contrast  Result Date: 04/25/2017 CLINICAL DATA:  Code stroke.  71 y/o  M; left-sided weakness. EXAM: CT ANGIOGRAPHY HEAD AND NECK CT HEAD CODE STROKE WITHOUT CONTRAST TECHNIQUE: Multidetector CT imaging of the head and neck was performed using the standard protocol during bolus administration of intravenous contrast. Multiplanar CT image reconstructions and MIPs were obtained to evaluate the vascular anatomy. Carotid stenosis measurements (when applicable) are obtained utilizing NASCET criteria, using the distal internal carotid diameter as the denominator. CONTRAST:  50 cc Isovue 370 COMPARISON:  None. FINDINGS: CT HEAD FINDINGS Brain: Small lucency in the right insula with loss of gray-white differentiation (series 3, image  13). Chronic lacunar infarction within the right superior thalamus. Moderate chronic microvascular ischemic changes of white matter and parenchymal volume loss of the brain. No acute intracranial hemorrhage identified. No hydrocephalus or effacement of basilar cisterns. Vascular: Right M1 hyperdensity. Extensive calcific atherosclerosis of carotid siphons. Skull: Normal. Negative for fracture or focal lesion. Sinuses: Left maxillary sinus mucous retention cyst. Otherwise negative. Orbits: No acute finding. Review of the MIP images confirms the above findings CTA NECK FINDINGS Aortic arch: 4.2 cm ascending aortic aneurysm. Bovine variant anatomy. Right carotid system: No evidence of dissection, stenosis (50% or greater) or occlusion. The mild calcified plaque of the carotid bifurcation and proximal ICA with mild less than 50% stenosis of proximal ICA. 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 Reconstitution of the left internal carotid artery via external carotid  collaterals with poor downstream enhancement of the left internal carotid artery to the terminus indicating slow flow. Vertebral arteries: Right V1 occlusion with reconstitution of the right vertebral artery at the V2 segment. Calcified plaque of left vertebral artery origin with severe stenosis. The left vertebral artery is otherwise normal in caliber through its course in the neck. Skeleton: The cervical degenerative changes greatest at the C4-5 level were disc disease and a small disc bulge results in mild canal stenosis. Other neck: The mediastinal adenopathy in the lower paratracheal and right upper peritracheal stations, probably due to venous congestion from pulmonary edema. Upper chest: Small right pleural effusion. Smooth interlobular septal thickening compatible with interstitial pulmonary edema. Review of the MIP images confirms the above findings CTA HEAD FINDINGS Anterior circulation: Extensive calcified plaque of the right carotid siphon with a short segment of severe distal cavernous and second short segment of paraclinoid severe stenosis. Poor enhancement of the distal left cavernous and ophthalmic segments may be due to high-grade stenosis or poor antegrade flow due to left common carotid artery occlusion. Right mid M1 occlusion with poor downstream right MCA collateralization. Patent bilateral anterior cerebral arteries and left middle cerebral artery without significant stenosis. Posterior circulation: No significant stenosis, proximal occlusion, aneurysm, or vascular malformation. Mild proximal basilar stenosis. Short segments of mild stenosis in the bilateral P1 segments. Right V4 segment short segment of mild stenosis secondary to calcified plaque. Venous sinuses: As permitted by contrast timing, patent. Anatomic variants: Small anterior communicating artery. No posterior communicating artery identified, likely hypoplastic or absent. Review of the MIP images confirms the above findings  IMPRESSION: Large vessel occlusion of right middle cerebral artery mid M1 segment with poor right MCA collateralization. ASPECTS = 9 right MCA infarction. No acute hemorrhage. CT head: 1. Small focus of hypoattenuation within right insula compatible with acute infarct, ASPECTS = 9. 2. Moderate chronic microvascular ischemic changes and moderate parenchymal volume loss of the brain. 3. Small chronic lacunar infarct in right superior thalamus. CTA neck: 1. Left common carotid artery occlusion. Reconstitution of left internal carotid artery from external carotid artery collaterals. Poor enhancement of left internal carotid artery indicating slow flow. 2. No significant stenosis of the right carotid system in the neck. 3. Right V1 occlusion with reconstitution of right vertebral artery at the V2 segment. 4. Severe left vertebral artery origin stenosis secondary to calcified plaque. 5. 4.2 cm aortic aneurysm. Recommend annual imaging followup by CTA or MRA. This recommendation follows 2010 ACCF/AHA/AATS/ACR/ASA/SCA/SCAI/SIR/STS/SVM Guidelines for the Diagnosis and Management of Patients with Thoracic Aortic Disease. Circulation. 2010; 121: Z610-R604. 6. Interstitial pulmonary edema. Enlarged mediastinal lymph nodes is likely related to pulmonary  edema. CTA head: 1. Right mid M1 occlusion. Poor downstream right MCA collateralization. 2. Poor enhancement of distal left cavernous and ophthalmic segments of ICA may be due to slow flow secondary to common carotid occlusion or underlying high-grade stenosis. 3. Intracranial atherosclerosis with multiple areas of mild stenosis in the anterior and posterior circulation. 4. No additional large vessel occlusion, aneurysm, or evidence for vascular malformation. These results were called by telephone at the time of interpretation on 04/25/2017 at 12:55 am to Dr. Ritta Slot , who verbally acknowledged these results. Electronically Signed   By: Mitzi Hansen M.D.    On: 04/25/2017 01:14   I reviewed the patient's CTA abd/pelvis with runoff, there is obvious thrombosis, likely acute, of R SFA with possible acute thrombus in R popliteal artery.  There is evidence of scattered calcific plaque in entire arterial system.   Medical Decision Making   [name] is a 71 y.o. male who presents with: likely right leg embolism, R MCA CVA s/p mechanical thrombectomy, acute on chronic heart failure, IDDM, HLD, HTN   I discussed the CTA findings with the patient's family.  I recommend: emergent right leg thromboembolectomy.  With some evidence of tibial blood flow, I suspect can avoid fasciotomy of R calf. The risk, benefits, and alternative for thrombectomy were discussed with the patient's family member. The patient's family is aware the risks include but are not limited to: bleeding, infection, myocardial infarction, stroke, limb loss, nerve damage, limb edema, need for additional procedures in the future, wound complications, and inability to completely eliminate embolic debris in the right leg.   I also discussed with the patient's family the risk of repeat of embolic episodes given this procedure will NOT address the root source of his embolism. The patient is aware of these risks and agreed to proceed.  Thank you for allowing [place] to participate in this patient's care.   Leonides Sake, MD, FACS [facility] [place] Office: [phone] Pager: [phone]  04/25/2017, 5:35 AM

## 2017-04-25 NOTE — Progress Notes (Signed)
STROKE TEAM PROGRESS NOTE   HISTORY OF PRESENT ILLNESS (per record) [name] is a 71 y.o. French-speaking male from [name] with a history of CAD with MI and CABG in [place] in 2007, CHF with EF of 25%, DM2 (recent A1C 9.4%), HLD, HTN, PE, and stroke who was admitted for CHF and was called as a Code Stroke at 1215 on 04/25/2017 after he developed L hemiparesis and a R-sided facial droop.  He had been admitted 04/22/2017 from the ED for dyspnea and lower extremity edema secondary to CHF, and a R 5th metatarsal fracture.  Recent earlier admission for CHF exacerbation.  The patient received tPA at 0130 on 04/25/2017, and underwent mechanical thrombectomy of R MCA via R femoral sheath in IR by Dr. Loreta AveWagner, with restoration of TICI 3 flow after 1 pass with Solitaire 4x40.  Post-thrombectomy bleeding after sheath removal attributed to R Groin Perclose failure.  Absent pulses in the right leg, patient taken for STAT CT HEAD and CTA leg to check for run-off, which showed  proximal occlusion of the superficial femoral artery.  Pt then taken for emergent right leg thromboembolectomy with Dr. Imogene Burnhen, who noted acute on chronic peripheral arterial disease with likely in-situ thrombosis rather than thromboembolism, likely with collaterals.  Urology consulted, passed 14Fr catheter through the contracted bladder neck by dilating to 30 Fr with Geraldo PitterGoodwin sounds.  Pt admitted to 53M under Cardiology service.  Transfer to 4N requested.  LKW: 2030 on 04/24/2017  Patient was administered IV t-PA at 0130 on 04/25/2017 . He was admitted to the neuro ICU for further evaluation and treatment.  SUBJECTIVE (INTERVAL HISTORY) His daughter and son-in-law are at the bedside.  The patient is intubated and on propofol for sedation.  Dopamine gtt was weaned this morning, however, pt is more hypotensive this afternoon.  The pt is slightly opening eyes to voice, and moving hands and feet to commands.  Repeat MRI negative for hemorrhagic transformation.   Increased FiO2 this afternoon on ventilator.      OBJECTIVE Temp:  [97.5 F (36.4 C)-98 F (36.7 C)] 97.9 F (36.6 C) (09/07 0530) Pulse Rate:  [66-97] 73 (09/07 1100) Cardiac Rhythm: Normal sinus rhythm (09/07 0530) Resp:  [11-30] 19 (09/07 1100) BP: (102-136)/(66-91) 102/87 (09/07 1100) SpO2:  [96 %-100 %] 100 % (09/07 1100) Arterial Line BP: (99-131)/(51-72) 109/51 (09/07 1100) FiO2 (%):  [40 %-60 %] 40 % (09/07 0935) Weight:  [85 kg (187 lb 8 oz)-87.7 kg (193 lb 4.8 oz)] 87.7 kg (193 lb 4.8 oz) (09/07 0515)  CBC:   Recent Labs Lab 04/24/17 1000 04/25/17 0719 04/25/17 0959  WBC 6.9  --  8.1  HGB 13.1 12.9* 11.5*  HCT 35.6* 38.0* 32.2*  MCV 69.5*  --  69.5*  PLT 248  --  213    Basic Metabolic Panel:   Recent Labs Lab 04/24/17 1000 04/25/17 0719 04/25/17 0959  NA 126* 129* 128*  K 4.8 4.6 4.8  CL 96*  --  101  CO2 21*  --  19*  GLUCOSE 173*  --  246*  BUN 19  --  18  CREATININE 1.05  --  1.05  CALCIUM 9.0  --  8.1*  MG  --   --  1.8    Lipid Panel:     Component Value Date/Time   CHOL 158 04/19/2017 0644   TRIG 126 04/19/2017 0644   HDL 40 (L) 04/19/2017 0644   CHOLHDL 4.0 04/19/2017 0644   VLDL 25 04/19/2017 16100644  LDLCALC 93 04/19/2017 0644   HgbA1c:  Lab Results  Component Value Date   HGBA1C 9.4 (H) 04/20/2017   Urine Drug Screen: No results found for: LABOPIA, COCAINSCRNUR, LABBENZ, AMPHETMU, THCU, LABBARB  Alcohol Level No results found for: ETH  IMAGING  Ct Head Code Stroke Wo Contrast 04/25/2017 FINDINGS Brain: Small lucency in the right insula with loss of gray-white differentiation (series 3, image 13). Chronic lacunar infarction within the right superior thalamus. Moderate chronic microvascular ischemic changes of white matter and parenchymal volume loss of the brain. No acute intracranial hemorrhage identified. No hydrocephalus or effacement of basilar cisterns. Vascular: Right M1 hyperdensity. Extensive calcific atherosclerosis of  carotid siphons. Skull: Normal. Negative for fracture or focal lesion. Sinuses: Left maxillary sinus mucous retention cyst. Otherwise negative. Orbits: No acute finding. Review of the MIP images confirms the above findings  CT head 04/25/2017 IMPRESSION:  1. Small focus of hypoattenuation within right insula compatible with acute infarct, ASPECTS = 9. 2. Moderate chronic microvascular ischemic changes and moderate parenchymal volume loss of the brain. 3. Small chronic lacunar infarct in right superior thalamus.   Dg Chest 2 View 04/24/2017 IMPRESSION: Cardiac enlargement without heart failure Mild right lower lobe atelectasis.  Dg Chest Port 1 View 04/25/2017 IMPRESSION: Appliances appear in satisfactory location. Cardiac enlargement with mild developing pulmonary vascular congestion and possible early perihilar edema.   Ct Head Wo Contrast 04/25/2017 IMPRESSION: Stable small lucency within the right insula compatible with region of acute infarction. No new area of infarction identified. No acute hemorrhage.   CTA head 04/25/2017 1. Right mid M1 occlusion. Poor downstream right MCA collateralization. 2. Poor enhancement of distal left cavernous and ophthalmic segments of ICA may be due to slow flow secondary to common carotid occlusion or underlying high-grade stenosis. 3. Intracranial atherosclerosis with multiple areas of mild stenosis in the anterior and posterior circulation. 4. No additional large vessel occlusion, aneurysm, or evidence for vascular malformation.    CTA neck 04/25/2017  1. Left common carotid artery occlusion. Reconstitution of left internal carotid artery from external carotid artery collaterals.  Poor enhancement of left internal carotid artery indicating slow flow.  2. No significant stenosis of the right carotid system in the neck. 3. Right V1 occlusion with reconstitution of right vertebral artery at the V2 segment. 4. Severe left vertebral artery origin stenosis  secondary to calcified plaque. 5. 4.2 cm aortic aneurysm. Recommend annual imaging followup by CTA or MRA. T 6. Interstitial pulmonary edema. Enlarged mediastinal lymph nodes is likely related to pulmonary edema.  Ir Percutaneous Art Thrombectomy/infusion Intracranial Inc Diag Angio 04/25/2017 IMPRESSION: Status post ultrasound guided access right common femoral artery with cerebral angiogram and mechanical thrombectomy for treatment of acute ELVO of right M1, with restoration of TICI 3 flow after 1 pass. Completion angiogram at the right common femoral artery puncture demonstrates proximal occlusion of the superficial femoral artery, and there are absent pedal pulses on Doppler examination at the right foot. Signed, Yvone Neu. Loreta Ave DO [facility] Radiology PLAN: Patient will have a noncontrast head CT performed. Patient will have CT angiogram runoff of the abdomen and lower extremities given the absent pedal pulses. Patient will be admitted to ICU maintained on the ventilator. Sheath has been withdrawn from the right common femoral artery, with failure of suture mediated device. Case has been discussed with vascular surgery. Compression dressing should remain for 6 hours.  Ct Angio Ao+bifem W & Or Wo Contrast 04/25/2017 IMPRESSION: Occlusion of the  right SFA just after the origin, with patent stump. There is minimal collateral filling of the distal SFA and popliteal artery via patent profunda femoris. There is also absent contrast flow within the right-sided tibial vasculature to the ankle. On the left, contrast flow is maintained through the popliteal artery, however, there is decrease contrast within the anterior tibial artery and posterior tibial artery. There appears to be at least partial filling of the peroneal artery to the ankle. This may be related to additional emboli versus baseline vascular disease. Moderate atherosclerotic changes of the bilateral  iliac, femoral-popliteal, and tibial vasculature, with calcifications through the length of the right SFA, right popliteal artery, and bilateral tibial arteries. Irregularity of the intimal surface of the right common femoral artery, which is patent with mild narrowing, at the site of recent femoral arterial sheath access. No evidence of hematoma with compression dressing in place.   MRI 04/25/2017 IMPRESSION: 1. Faint ischemic changes involving the right insula out and basal ganglia as above. No associated hemorrhage or mass effect.  2. Additional punctate 5 mm subcortical left occipital lobe infarct. 3. Remote right thalamic lacunar infarct with additional tiny remote bilateral cerebellar infarcts. 4. Atrophy with moderate chronic microvascular ischemic disease.  TTE 04/18/2017 Study Conclusions - Left ventricle: The cavity size was moderately dilated. Wall  thickness was normal. Systolic function was severely reduced. The estimated ejection fraction was in the range of 20% to 25%. - Aortic valve: There was mild regurgitation. - Mitral valve: There was mild regurgitation.    PHYSICAL EXAM Elderly african male intubated and sedated. . Afebrile. Head is nontraumatic. Neck is supple without bruit.   Cardiac exam no murmur or gallop. Lungs are clear to auscultation. Distal pulses are well felt.poor pulses in right lower extremity with fasciotomy incision staples and drain Neurological Exam :  Sedated intubated. Opens eyes to stimulation follows simple midline commands occasionally. Eyes primary position, Fundi not visualized.Pupils 2 mm reactive.face symmetric.is midline. Motor system exam shows left hemiplegia and with flaccidity and hypotonia in the  Left upper extremity. Trace withdrawal in the left lower e Extremity. Purposeful antigravity movements in  RUE.Trace movement in the right lower extremity.   ASSESSMENT/PLAN [name] is a 71 y.o. male with history of . French-speaking male  from [place] with a history of CAD with MI and CABG in [place] in 2007, CHF with EF of 25%, DM2 (recent A1C 9.4%), HLD, HTN, PE, and stroke who was admitted for CHF and was called as a Code Stroke at 1215 on 04/25/2017 after he developed L hemiparesis and a R-sided facial droop.  Patient was administered IV t-PA at 0130 on 04/25/2017.   Stroke:   Resultant  Left hemiplegia  CT head: Small lucency in the right insula with loss of gray-white differentiation (series 3, image 13). Chronic lacunar infarction within the right superior thalamus  MRI head: Faint ischemic changes in R insula and basal ganglia.  New punctate 5 mm subcortical L occipital lobe infarct.  Remote R thalamic lacunar infarct with additional tiny remote bilateral cerebellar infarcts.  MRA head: not performed  CTA head/neck: Right mid M1 occlusion.  Right V1 occlusion with reconstitution of right vertebral artery at the V2 segment.  High-grade L VA stenosis with calcified plaque.  4.2 cm aortic aneurysm.   2D Echo: EF 20-25%. No source of embolus   LDL 93   HgbA1c 9.4  SCDs for VTE prophylaxis Diet NPO time specified  aspirin 325 mg daily prior to admission,  now on aspirin 81 mg daily  Patient counseled to be compliant with his antithrombotic medications  Ongoing aggressive stroke risk factor management  Therapy recommendations:  pending  Disposition:  pending  Hyportension  Unstable  Dopamine gtt weaned this morning  Frequent monitoring now  Long-term BP goal normotensive  Hyperlipidemia  Home meds: atorvastatin 80 mg PO daily, resumed in hospital  LDL 93, goal < 70  Continue statin at discharge  Diabetes  HgbA1c 9.4, goal < 7.0  Uncontrolled  DM Coordinator consulted  Other Stroke Risk Factors  Advanced age  Hx stroke/TIA  Coronary artery disease  HFrEF (20-25%)  Other Active Problems  None  Hospital day # 1  I have personally examined this patient, reviewed notes, independently  viewed imaging studies, participated in medical decision making and plan of care.ROS completed by me personally and pertinent positives fully documented  I have made any additions or clarifications directly to the above note. He was admitted for CHF and inhouse developed left hemiplegia from embolic Rt M1 occlusion and underwent emergent mech embolectomy but developed Rt LE ischemia requiring emergent vascular surgery for femoral embolectomy as well.Etiology likely cardioembolic from his cardiomyopathy.Recommmend extubate as tolerated and check CT head in am and if no bleed may start iv heparin after 24 hrs post TPA.Strict BP control and close neurological monitoring as per post TPA protocol.D/W Dr Craige Cotta and patient`s daughter and son in law and answered questions. This patient is critically ill and at significant risk of neurological worsening, death and care requires constant monitoring of vital signs, hemodynamics,respiratory and cardiac monitoring, extensive review of multiple databases, frequent neurological assessment, discussion with family, other specialists and medical decision making of high complexity.I have made any additions or clarifications directly to the above note.This critical care time does not reflect procedure time, or teaching time or supervisory time of PA/NP/Med Resident etc but could involve care discussion time.  I spent 40 minutes of neurocritical care time  in the care of  this patient.      Delia Heady, MD Medical Director [facility] Pager: [phone] 04/25/2017 6:48 PM   To contact Stroke Continuity provider, please refer to [email or web address]. After hours, contact General Neurology

## 2017-04-25 NOTE — Progress Notes (Signed)
Pt accompanied to CT scan  With rapid response rn and neurologist.

## 2017-04-25 NOTE — Sedation Documentation (Signed)
Pt transported to 2M7 on bed with monitor. Felicia, RN in room to accept care of patient. All questions answered. R groin and dressing assessed together. Informed her the V Pad would need to be removed within 24hrs. She verbalized understanding.

## 2017-04-25 NOTE — Progress Notes (Signed)
RT note- Patient returned from MRI with ventilator and remains on current settings

## 2017-04-25 NOTE — Progress Notes (Signed)
Patient ID: [name], male   DOB: [birth year], 71 y.o.   MRN: [id number]    Referring Physician(s): Dr. Roland Rack   Supervising Physician: Corrie Mckusick  Patient Status: [name] - In-pt  Chief Complaint: CVA  Subjective: Patient on vent with some sedation.  Moves right side spontaneously  Allergies: Patient has no known allergies.  Medications: Prior to Admission medications   Medication Sig Start Date End Date Taking? Authorizing Provider  acetaminophen (TYLENOL) 650 MG CR tablet Take 650 mg by mouth at bedtime. FOR LEG PAIN   Yes [provider]  aspirin EC 325 MG tablet Take 1 tablet (325 mg total) by mouth daily. 04/23/17  Yes Ena Dawley, Tiffany S, PA-C  atorvastatin (LIPITOR) 80 MG tablet Take 1 tablet (80 mg total) by mouth daily at 6 PM. 04/23/17  Yes Ena Dawley, Tiffany S, PA-C  carvedilol (COREG) 3.125 MG tablet Take 1 tablet (3.125 mg total) by mouth 2 (two) times daily with a meal. 04/23/17  Yes Ena Dawley, Tiffany S, PA-C  feeding supplement, GLUCERNA SHAKE, (GLUCERNA SHAKE) LIQD Take 237 mLs by mouth 2 (two) times daily between meals.   Yes [provider]  furosemide (LASIX) 20 MG tablet Take 1 tablet (20 mg total) by mouth daily. 04/23/17  Yes Ena Dawley, Tiffany S, PA-C  insulin glargine (LANTUS) 100 UNIT/ML injection Inject 0.05 mLs (5 Units total) into the skin at bedtime. 04/22/17 05/02/17 Yes Noemi Chapel, MD  losartan (COZAAR) 25 MG tablet Take 1 tablet (25 mg total) by mouth daily. 04/23/17  Yes Ena Dawley, Tiffany S, PA-C  meloxicam (MOBIC) 7.5 MG tablet Take 2 tablets (15 mg total) by mouth daily. Patient taking differently: Take 7.5 mg by mouth 2 (two) times daily.  04/22/17  Yes Noemi Chapel, MD  metFORMIN (GLUCOPHAGE) 1000 MG tablet Take 1 tablet (1,000 mg total) by mouth daily with breakfast. 04/23/17  Yes Ena Dawley, Tiffany S, PA-C  Blood Glucose Monitoring Suppl (TRUE METRIX METER) DEVI 1 kit by Does not apply route 4 (four) times daily. 04/23/17   Brayton Caves, PA-C    glucose blood (TRUE METRIX BLOOD GLUCOSE TEST) test strip Use as instructed 04/23/17   Brayton Caves, PA-C  TRUEPLUS LANCETS 28G MISC 28 g by Does not apply route 4 (four) times daily. 04/23/17   Brayton Caves, PA-C    Vital Signs: BP 94/66   Pulse 77   Temp (!) 97 F (36.1 C) (Axillary)   Resp 13   Ht 6' (1.829 m)   Wt 193 lb 4.8 oz (87.7 kg)   SpO2 99%   BMI 26.22 kg/m   Physical Exam: Gen: critically ill appearing Neuro: spontaneously moves right side, didn't move left side, but didn't follow commands Skin: R CFA stick site is c/d/i with no evidence of bleeding or hematoma  Imaging: Ct Angio Head W Or Wo Contrast  Result Date: 04/25/2017 CLINICAL DATA:  Code stroke.  71 y/o  M; left-sided weakness. EXAM: CT ANGIOGRAPHY HEAD AND NECK CT HEAD CODE STROKE WITHOUT CONTRAST TECHNIQUE: Multidetector CT imaging of the head and neck was performed using the standard protocol during bolus administration of intravenous contrast. Multiplanar CT image reconstructions and MIPs were obtained to evaluate the vascular anatomy. Carotid stenosis measurements (when applicable) are obtained utilizing NASCET criteria, using the distal internal carotid diameter as the denominator. CONTRAST:  50 cc Isovue 370 COMPARISON:  None. FINDINGS: CT HEAD FINDINGS Brain: Small lucency in the right insula with loss of gray-white differentiation (series 3, image 13). Chronic  lacunar infarction within the right superior thalamus. Moderate chronic microvascular ischemic changes of white matter and parenchymal volume loss of the brain. No acute intracranial hemorrhage identified. No hydrocephalus or effacement of basilar cisterns. Vascular: Right M1 hyperdensity. Extensive calcific atherosclerosis of carotid siphons. Skull: Normal. Negative for fracture or focal lesion. Sinuses: Left maxillary sinus mucous retention cyst. Otherwise negative. Orbits: No acute finding. Review of the MIP images confirms the above findings CTA  NECK FINDINGS Aortic arch: 4.2 cm ascending aortic aneurysm. Bovine variant anatomy. Right carotid system: No evidence of dissection, stenosis (50% or greater) or occlusion. The mild calcified plaque of the carotid bifurcation and proximal ICA with mild less than 50% stenosis of proximal ICA. 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 Reconstitution of the left internal carotid artery via external carotid collaterals with poor downstream enhancement of the left internal carotid artery to the terminus indicating slow flow. Vertebral arteries: Right V1 occlusion with reconstitution of the right vertebral artery at the V2 segment. Calcified plaque of left vertebral artery origin with severe stenosis. The left vertebral artery is otherwise normal in caliber through its course in the neck. Skeleton: The cervical degenerative changes greatest at the C4-5 level were disc disease and a small disc bulge results in mild canal stenosis. Other neck: The mediastinal adenopathy in the lower paratracheal and right upper peritracheal stations, probably due to venous congestion from pulmonary edema. Upper chest: Small right pleural effusion. Smooth interlobular septal thickening compatible with interstitial pulmonary edema. Review of the MIP images confirms the above findings CTA HEAD FINDINGS Anterior circulation: Extensive calcified plaque of the right carotid siphon with a short segment of severe distal cavernous and second short segment of paraclinoid severe stenosis. Poor enhancement of the distal left cavernous and ophthalmic segments may be due to high-grade stenosis or poor antegrade flow due to left common carotid artery occlusion. Right mid M1 occlusion with poor downstream right MCA collateralization. Patent bilateral anterior cerebral arteries and left middle cerebral artery without significant stenosis. Posterior circulation: No significant stenosis, proximal occlusion, aneurysm, or  vascular malformation. Mild proximal basilar stenosis. Short segments of mild stenosis in the bilateral P1 segments. Right V4 segment short segment of mild stenosis secondary to calcified plaque. Venous sinuses: As permitted by contrast timing, patent. Anatomic variants: Small anterior communicating artery. No posterior communicating artery identified, likely hypoplastic or absent. 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 ASPECTS = 9 right MCA infarction. No acute hemorrhage. CT head: 1. Small focus of hypoattenuation within right insula compatible with acute infarct, ASPECTS = 9. 2. Moderate chronic microvascular ischemic changes and moderate parenchymal volume loss of the brain. 3. Small chronic lacunar infarct in right superior thalamus. CTA neck: 1. Left common carotid artery occlusion. Reconstitution of left internal carotid artery from external carotid artery collaterals. Poor enhancement of left internal carotid artery indicating slow flow. 2. No significant stenosis of the right carotid system in the neck. 3. Right V1 occlusion with reconstitution of right vertebral artery at the V2 segment. 4. Severe left vertebral artery origin stenosis secondary to calcified plaque. 5. 4.2 cm aortic aneurysm. Recommend annual imaging followup by CTA or MRA. This recommendation follows 2010 ACCF/AHA/AATS/ACR/ASA/SCA/SCAI/SIR/STS/SVM Guidelines for the Diagnosis and Management of Patients with Thoracic Aortic Disease. Circulation. 2010; 121: I458-K998. 6. Interstitial pulmonary edema. Enlarged mediastinal lymph nodes is likely related to pulmonary edema. CTA  head: 1. Right mid M1 occlusion. Poor downstream right MCA collateralization. 2. Poor enhancement of distal left cavernous and ophthalmic segments of ICA may be due to slow flow secondary to common carotid occlusion or underlying  high-grade stenosis. 3. Intracranial atherosclerosis with multiple areas of mild stenosis in the anterior and posterior circulation. 4. No additional large vessel occlusion, aneurysm, or evidence for vascular malformation. These results were called by telephone at the time of interpretation on 04/25/2017 at 12:55 am to Dr. Roland Rack , who verbally acknowledged these results. Electronically Signed   By: Kristine Garbe M.D.   On: 04/25/2017 01:14   Dg Chest 2 View  Result Date: 04/24/2017 CLINICAL DATA:  Short of breath EXAM: CHEST  2 VIEW COMPARISON:  04/17/2017 FINDINGS: Cardiac enlargement without heart failure. Negative for infiltrate or effusion. Negative for mass or adenopathy. Mild right lower lobe atelectasis IMPRESSION: Cardiac enlargement without heart failure Mild right lower lobe atelectasis. Electronically Signed   By: Franchot Gallo M.D.   On: 04/24/2017 10:34   Ct Head Wo Contrast  Result Date: 04/25/2017 CLINICAL DATA:  71 y/o M; post transarterial intervention of stroke. EXAM: CT HEAD WITHOUT CONTRAST TECHNIQUE: Contiguous axial images were obtained from the base of the skull through the vertex without intravenous contrast. COMPARISON:  04/25/2017 CT head FINDINGS: Brain: Stable small lucency within the right insula compatible with region of acute infarction. No new area of loss of gray-white differentiation to indicate interval infarction. No acute hemorrhage. Stable background of moderate chronic microvascular ischemic changes and parenchymal volume loss of the brain. No hydrocephalus, extra-axial collection, or effacement of basilar cisterns. Vascular: The persisting contrast opacification of the dural venous sinuses and circle of Willis. Skull: Normal. Negative for fracture or focal lesion. Sinuses/Orbits: The left maxillary sinus mucous retention cyst. Fluid within the nasal and oropharynx is likely due to intubation. Other: None. IMPRESSION: Stable small lucency within  the right insula compatible with region of acute infarction. No new area of infarction identified. No acute hemorrhage. Electronically Signed   By: Kristine Garbe M.D.   On: 04/25/2017 04:44   Ct Angio Neck W Or Wo Contrast  Result Date: 04/25/2017 CLINICAL DATA:  Code stroke.  71 y/o  M; left-sided weakness. EXAM: CT ANGIOGRAPHY HEAD AND NECK CT HEAD CODE STROKE WITHOUT CONTRAST TECHNIQUE: Multidetector CT imaging of the head and neck was performed using the standard protocol during bolus administration of intravenous contrast. Multiplanar CT image reconstructions and MIPs were obtained to evaluate the vascular anatomy. Carotid stenosis measurements (when applicable) are obtained utilizing NASCET criteria, using the distal internal carotid diameter as the denominator. CONTRAST:  50 cc Isovue 370 COMPARISON:  None. FINDINGS: CT HEAD FINDINGS Brain: Small lucency in the right insula with loss of gray-white differentiation (series 3, image 13). Chronic lacunar infarction within the right superior thalamus. Moderate chronic microvascular ischemic changes of white matter and parenchymal volume loss of the brain. No acute intracranial hemorrhage identified. No hydrocephalus or effacement of basilar cisterns. Vascular: Right M1 hyperdensity. Extensive calcific atherosclerosis of carotid siphons. Skull: Normal. Negative for fracture or focal lesion. Sinuses: Left maxillary sinus mucous retention cyst. Otherwise negative. Orbits: No acute finding. Review of the MIP images confirms the above findings CTA NECK FINDINGS Aortic arch: 4.2 cm ascending aortic aneurysm. Bovine variant anatomy. Right carotid system: No evidence of dissection, stenosis (50% or greater) or occlusion. The mild calcified plaque of the carotid bifurcation and proximal ICA with mild less than 50% stenosis of proximal ICA.  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 Reconstitution of the left internal  carotid artery via external carotid collaterals with poor downstream enhancement of the left internal carotid artery to the terminus indicating slow flow. Vertebral arteries: Right V1 occlusion with reconstitution of the right vertebral artery at the V2 segment. Calcified plaque of left vertebral artery origin with severe stenosis. The left vertebral artery is otherwise normal in caliber through its course in the neck. Skeleton: The cervical degenerative changes greatest at the C4-5 level were disc disease and a small disc bulge results in mild canal stenosis. Other neck: The mediastinal adenopathy in the lower paratracheal and right upper peritracheal stations, probably due to venous congestion from pulmonary edema. Upper chest: Small right pleural effusion. Smooth interlobular septal thickening compatible with interstitial pulmonary edema. Review of the MIP images confirms the above findings CTA HEAD FINDINGS Anterior circulation: Extensive calcified plaque of the right carotid siphon with a short segment of severe distal cavernous and second short segment of paraclinoid severe stenosis. Poor enhancement of the distal left cavernous and ophthalmic segments may be due to high-grade stenosis or poor antegrade flow due to left common carotid artery occlusion. Right mid M1 occlusion with poor downstream right MCA collateralization. Patent bilateral anterior cerebral arteries and left middle cerebral artery without significant stenosis. Posterior circulation: No significant stenosis, proximal occlusion, aneurysm, or vascular malformation. Mild proximal basilar stenosis. Short segments of mild stenosis in the bilateral P1 segments. Right V4 segment short segment of mild stenosis secondary to calcified plaque. Venous sinuses: As permitted by contrast timing, patent. Anatomic variants: Small anterior communicating artery. No posterior communicating artery identified, likely hypoplastic or absent. Review of the MIP images  confirms the above findings IMPRESSION: Large vessel occlusion of right middle cerebral artery mid M1 segment with poor right MCA collateralization. ASPECTS = 9 right MCA infarction. No acute hemorrhage. CT head: 1. Small focus of hypoattenuation within right insula compatible with acute infarct, ASPECTS = 9. 2. Moderate chronic microvascular ischemic changes and moderate parenchymal volume loss of the brain. 3. Small chronic lacunar infarct in right superior thalamus. CTA neck: 1. Left common carotid artery occlusion. Reconstitution of left internal carotid artery from external carotid artery collaterals. Poor enhancement of left internal carotid artery indicating slow flow. 2. No significant stenosis of the right carotid system in the neck. 3. Right V1 occlusion with reconstitution of right vertebral artery at the V2 segment. 4. Severe left vertebral artery origin stenosis secondary to calcified plaque. 5. 4.2 cm aortic aneurysm. Recommend annual imaging followup by CTA or MRA. This recommendation follows 2010 ACCF/AHA/AATS/ACR/ASA/SCA/SCAI/SIR/STS/SVM Guidelines for the Diagnosis and Management of Patients with Thoracic Aortic Disease. Circulation. 2010; 121: E280-K349. 6. Interstitial pulmonary edema. Enlarged mediastinal lymph nodes is likely related to pulmonary edema. CTA head: 1. Right mid M1 occlusion. Poor downstream right MCA collateralization. 2. Poor enhancement of distal left cavernous and ophthalmic segments of ICA may be due to slow flow secondary to common carotid occlusion or underlying high-grade stenosis. 3. Intracranial atherosclerosis with multiple areas of mild stenosis in the anterior and posterior circulation. 4. No additional large vessel occlusion, aneurysm, or evidence for vascular malformation. These results were called by telephone at the time of interpretation on 04/25/2017 at 12:55 am to Dr. Roland Rack , who verbally acknowledged these results. Electronically Signed   By: Kristine Garbe M.D.   On: 04/25/2017 01:14   Mr Jeri Cos ZP Contrast  Result Date: 04/25/2017 CLINICAL DATA:  Follow-up examination for stroke, status post transarterial intervention for right M1 EVLO. EXAM: MRI HEAD WITHOUT AND WITH CONTRAST TECHNIQUE: Multiplanar, multiecho pulse sequences of the brain and surrounding structures were obtained without and with intravenous contrast. CONTRAST:  23m MULTIHANCE GADOBENATE DIMEGLUMINE 529 MG/ML IV SOLN COMPARISON:  Prior CT from earlier the same day as well as previous studies. FINDINGS: Brain: Generalized age-related cerebral atrophy. Patchy and confluent T2/FLAIR hyperintensity within the periventricular and deep white matter both cerebral hemispheres most consistent with chronic microvascular disease, moderate nature. Remote lacunar infarct with associated chronic hemorrhagic blood products present within the right thalamus. Few additional tiny remote bilateral cerebellar infarcts noted. Mild diffusion abnormality present within the posterior right lentiform nucleus extending superiorly into the right caudate (series 4, image 18). Additional faint diffusion abnormality within the right insula, most notable inferiorly (series 3, image 27) findings consistent with acute ischemia. No associated mass effect or hemorrhage. No other areas of acute right MCA territory infarction. Additional punctate 5 mm subcortical nonhemorrhagic infarct noted within the left occipital lobe (series 3, image 26). Gray-white matter differentiation otherwise maintained. No other evidence for acute or subacute ischemia. No evidence for acute intracranial hemorrhage. Few scattered punctate chronic micro hemorrhages noted within the brain and posterior fossa, likely hypertensive in nature. No mass lesion, midline shift or mass effect. No hydrocephalus. No extra-axial fluid collection. Major dural sinuses are grossly patent. No abnormal enhancement. Pituitary gland suprasellar region  normal. Vascular: Major intracranial vascular flow voids are maintained. Skull and upper cervical spine: Craniocervical junction within normal limits. Visualized upper cervical spine unremarkable. Bone marrow signal intensity within normal limits. Probable benign hemangioma noted within the C2 vertebral body. No scalp soft tissue abnormality. Sinuses/Orbits: Globes and orbital soft tissues within normal limits. Retention cysts noted within the left maxillary sinus. Paranasal sinuses otherwise largely clear. No mastoid effusion. Inner ear structures normal. Other: Subcentimeter T2 hyperintensity noted within the left parotid gland, of doubtful significance. IMPRESSION: 1. Faint ischemic changes involving the right insula out and basal ganglia as above. No associated hemorrhage or mass effect. 2. Additional punctate 5 mm subcortical left occipital lobe infarct. 3. Remote right thalamic lacunar infarct with additional tiny remote bilateral cerebellar infarcts. 4. Atrophy with moderate chronic microvascular ischemic disease. Electronically Signed   By: BJeannine BogaM.D.   On: 04/25/2017 14:24   Ct Angio Ao+bifem W & Or Wo Contrast  Result Date: 04/25/2017 CLINICAL DATA:  71year old male with a history of admission for congestive heart failure 24 hours prior to acute inpatient clinical stroke with emergent large vessel occlusion of the right middle cerebral artery. Status post treatment with both mechanical thrombectomy and systemic, weight based dose of tPA, the right MCA flow has been restored, however, right foot pulses were discovered to be absent. Diagnostic CT imaging was performed to evaluate the right lower extremity arterial system. EXAM: CT ANGIOGRAPHY OF ABDOMINAL AORTA WITH ILIOFEMORAL RUNOFF TECHNIQUE: Multidetector CT imaging of the abdomen, pelvis and lower extremities was performed using the standard protocol during bolus administration of intravenous contrast. Multiplanar CT image  reconstructions and MIPs were obtained to evaluate the vascular anatomy. CONTRAST:  100 cc Isovue 3 7 COMPARISON:  Chest CT 04/18/2017 FINDINGS: VASCULAR Aorta: Atherosclerotic plaque of the abdominal aorta with mixed calcified and soft plaque. No aneurysm or dissection of the visualized lower abdominal aorta. IMA: Inferior mesenteric artery is patent. Right lower extremity: Mild soft and calcified plaque of the right iliac system. Right common iliac artery measures 13 mm.  No dissection. Hypogastric artery is patent. Moderate atherosclerotic changes of the right hypogastric system. Right external iliac artery patent with mild atherosclerotic changes. Irregular intimal contour of the right common femoral artery with luminal narrowing, at the site of recent arterial access. Common femoral artery remains patent throughout its course to the bifurcation. Profunda femoris remains patent, as well as the thigh branches. There is proximal occlusion of the superficial femoral artery. Circumferential calcified plaque of the length of the superficial femoral artery with background of atherosclerotic changes extending through the popliteal artery. No contrast is visualized through the proximal 2/3 of the SFA. Collaterals partially fill the distal SFA and above knee popliteal artery. No contrast identified through the length of the tibial vessels. Baseline calcified disease present through the length of the tibial vessels including predominantly anterior tibial artery and posterior tibial artery. Left lower extremity: Mixed calcified and soft plaque of the left iliac system. Diameter of the left common iliac artery measures 12 mm. No dissection flap. Hypogastric arteries patent with moderate atherosclerotic changes. External iliac artery patent with mild plaque. Common femoral artery patent with mild plaque. Profunda femoris patent as well as the thigh branches. Superficial femoral artery demonstrates mild to moderate calcified  and soft plaque with no significant stenosis identified. Popliteal artery appears patent through its length with mild plaque. There is decrease contrast column within the distal popliteal artery, with decrease contrast filling of the tibial vessels. There does appear to be discontinuous contrast flow of the peroneal artery to the ankle, with questionable filling of the anterior tibial and posterior tibial vessels. Baseline calcified disease of the tibial vessels, predominantly anterior tibial artery and posterior tibial artery. Veins: Unremarkable appearance of the venous system. Review of the MIP images confirms the above findings. NON-VASCULAR Urinary tract: Incomplete visualization of the bilateral kidneys. Likely Bosniak 1 cyst at the inferior left kidney. Excreted contrast within the urinary bladder. Stomach/Bowel: Unremarkable appearance of visualized small bowel. Unremarkable appearance of the visualized colon. Lymphatic: Multiple lymph nodes in the para-aortic nodal station, none of which are enlarged. Mesenteric: No adenopathy. Reproductive: Prostate diameter not enlarged. Other: Gas within the anterior abdominal soft tissues, potentially secondary to injections. Compression dressing on the right common femoral region. Musculoskeletal: Degenerative changes of the lower lumbar spine. No displaced fracture. IMPRESSION: Occlusion of the right SFA just after the origin, with patent stump. There is minimal collateral filling of the distal SFA and popliteal artery via patent profunda femoris. There is also absent contrast flow within the right-sided tibial vasculature to the ankle. On the left, contrast flow is maintained through the popliteal artery, however, there is decrease contrast within the anterior tibial artery and posterior tibial artery. There appears to be at least partial filling of the peroneal artery to the ankle. This may be related to additional emboli versus baseline vascular disease. Moderate  atherosclerotic changes of the bilateral iliac, femoral-popliteal, and tibial vasculature, with calcifications through the length of the right SFA, right popliteal artery, and bilateral tibial arteries. Irregularity of the intimal surface of the right common femoral artery, which is patent with mild narrowing, at the site of recent femoral arterial sheath access. No evidence of hematoma with compression dressing in place. Signed, Dulcy Fanny. Earleen Newport, DO [facility] Electronically Signed   By: Corrie Mckusick D.O.   On: 04/25/2017 05:06   Dg Chest Port 1 View  Result Date: 04/25/2017 CLINICAL DATA:  Endotracheal intubation. EXAM: PORTABLE CHEST 1 VIEW COMPARISON:  04/24/2017  FINDINGS: Postoperative changes in the mediastinum. Endotracheal tube with tip measuring 3.5 cm above the carina. Enteric tube tip is off the field of view but below the left hemidiaphragm. Shallow inspiration. Cardiac enlargement. Mild developing pulmonary vascular congestion. Possible early perihilar edema. No blunting of costophrenic angles. No pneumothorax. IMPRESSION: Appliances appear in satisfactory location. Cardiac enlargement with mild developing pulmonary vascular congestion and possible early perihilar edema. Electronically Signed   By: Lucienne Capers M.D.   On: 04/25/2017 05:40   Dg Foot Complete Right  Result Date: 04/22/2017 CLINICAL DATA:  Pain after chair rolled over foot 2 weeks prior EXAM: RIGHT FOOT COMPLETE - 3+ VIEW COMPARISON:  None. FINDINGS: Frontal, oblique, and lateral views were obtained. There is a tiny avulsion along the lateral aspect of the distal aspect of the fifth proximal phalanx. There is no other evident fracture. No dislocation. There is osteoarthritic change in the first MTP joint. There is spurring in the dorsal midfoot. No erosive change. There are foci of arterial vascular calcification. There are small spur posterior and inferior calcaneal  spurs. IMPRESSION: Tiny avulsion along the lateral aspect of the distal portion of the fifth proximal phalanx. No other fracture. No dislocation. 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 No erosive change. Areas of arterial vascular calcification most likely are due to diabetes mellitus. Small calcaneal spurs noted. Electronically Signed   By: Lowella Grip III M.D.   On: 04/22/2017 21:54   Ir Percutaneous Art Thrombectomy/infusion Intracranial Inc Diag Angio  Result Date: 04/25/2017 INDICATION: 71 year old male admitted with a history of congestive heart failure. Acute mental status change recognized at approximately mid night, with last known well of 10:30 p.m. with acute code stroke activation. Imaging demonstrates right M1 occlusion corresponding to the patient's dense left-sided symptoms. EXAM: ULTRASOUND GUIDED ACCESS RIGHT COMMON FEMORAL ARTERY CERVICAL AND CEREBRAL ANGIOGRAM MECHANICAL THROMBECTOMY RIGHT MCA OCCLUSION COMPARISON:  CT AND CT ANGIOGRAM IMAGING OF THE SAME DATE MEDICATIONS: None ANESTHESIA/SEDATION: General endotracheal anesthesia CONTRAST:  40 cc Isovue FLUOROSCOPY TIME:  Fluoroscopy Time: 12 minutes 18 seconds (839 mGy). COMPLICATIONS: None TECHNIQUE: Informed written consent was obtained from the patient's family after a thorough discussion of the procedural risks, benefits and alternatives. Specific risks discussed include: Bleeding, infection, contrast reaction, kidney injury/failure, need for further procedure/surgery, arterial injury or dissection, embolization to new territory, intracranial hemorrhage (10-15% risk), neurologic deterioration, cardiopulmonary collapse, death. All questions were addressed. Maximal Sterile Barrier Technique was utilized including during the procedure including caps, mask, sterile gowns, sterile gloves, sterile drape, hand hygiene and skin antiseptic. A timeout was performed prior to the initiation of the procedure. FINDINGS:  Initial: Right common carotid artery:  Normal course caliber and contour. Right external carotid artery: Patent with antegrade flow. Right internal carotid artery: Mildly tortuous right cervical ICA with no significant atherosclerotic changes at the carotid bulb. No significant stenosis. Right MCA: Initial angiogram demonstrates proximal occlusion of the right middle cerebral artery. There is an early downgoing temporal branch which remains patent. Initial flow is TICI 0. Moderate collateral flow into the MCA territory from [facility] and transcranial branches. Right ACA: A 1 segment patent. A 2 segment perfuses the right ACA territory. Cross filling of the patent anterior communicating artery with the right-sided carotid flow perfusing the left anterior cerebral artery territory. There is significant competing arterial flow from the left anterior cerebral artery, with to and fro flow of the left A1 segment, compatible with adequate collateralization of the left MCA system. Late phase  of the right common injection demonstrates partial opacification of the left cervical ICA and proximal MCA. Completion: After single pass of mechanical thrombectomy stent solitaire device, there is restoration of TICI 3 flow of the right MCA territory. Femoral artery angiogram demonstrates patent common femoral artery with appropriate puncture site. Profunda femoris patent with occlusion of the proximal superficial femoral artery. No Doppler signal of the right pedal pulses at the completion of the study. PROCEDURE: The patient is brought to the interventional radiology suite and placed in the supine position on the IR table. The anesthesia team was present for induction of general endotracheal anesthesia. Anesthesia team initiated a left radial arterial line. After patient preparation was performed and a time-out procedure to confirm patient and procedure and consent, ultrasound survey of the right inguinal region was performed with images  stored and sent to PACs. 11 blade scalpel was used to make a small incision. Blunt dissection was performed. A micropuncture needle was used access the right common femoral artery under ultrasound. With excellent arterial blood flow returned, an .018 micro wire was passed through the needle, observed to enter the abdominal aorta under fluoroscopy. The needle was removed, and a micropuncture sheath was placed over the wire. The inner dilator and wire were removed, and an 035 Bentson wire was advanced under fluoroscopy into the abdominal aorta. The sheath was removed and a standard 5 [place] vascular sheath was placed. The dilator was removed and the sheath was flushed. A 82F JB-1 diagnostic catheter was advanced over the wire to the proximal descending thoracic aorta. Wire was then removed. Double flush of the catheter was performed. Catheter was then used to navigate through the innominate artery and into the right common carotid artery. Formal angiogram was performed. Exchange length Rosen wire was then passed through the diagnostic catheter to the distal common carotid artery and into the proximal right internal carotid artery and the diagnostic catheter was removed. The 5 French sheath was removed and exchanged for 8 French 55 centimeter BrightTip sheath. Sheath was flushed and attached to pressurized and heparinized saline bag for constant forward flow. Then an 8 [place], 85 cm Flowgate balloon tip catheter was prepared on the back table with inflation of the balloon with 50/50 concentration of dilute contrast. The balloon catheter was then advanced over the wire, positioned into the internal carotid artery. Copious back flush was performed and the balloon catheter was attached to heparinized and pressurized saline bag for forward flow. Angiogram was performed for road map guide. Microcatheter system was then introduced through the balloon guide catheter, using a synchro soft 014 wire and a Trevo Provue18 catheter.  Microcatheter system was advanced into the internal carotid artery, to the level of the occlusion. The micro wire was then carefully advanced through the occluded segment. Microcatheter was then push through the occluded segment and the wire was removed. Blood was then aspirated through the hub of the microcatheter, and a gentle contrast injection was performed confirming intraluminal position. A rotating hemostatic valve was then attached to the back end of the microcatheter, and a pressurized and heparinized saline bag was attached to the catheter. 4 x 40 solitaire device was then selected. Back flush was achieved at the rotating hemostatic valve, and then the device was gently advanced through the microcatheter to the distal end. The retriever was then unsheathed by withdrawing the microcatheter under fluoroscopy. Once the retriever was completely unsheathed, a 3 minute time period was observed. The balloon at the balloon tip catheter was  then inflated under fluoroscopy for proximal flow arrest. Constant aspiration was then performed at the tip of the balloon guide catheter as the retriever was gently and slowly withdrawn with fluoroscopic observation. Once the retriever was entirely removed from the system, free aspiration was confirmed at the hub of the balloon guide catheter, with free blood return confirmed. The balloon was then deflated, rotating hemostatic valve was reattached, and a control angiogram was performed. Control angiogram was performed at the right common femoral artery puncture site. The 55 centimeter 8 French sheath was removed over roadrunner 035 wire. Abbott Proglide suture mediated closure was attempted, with failure of the suture. Manual pressure was applied. Gel-Foam was inserted into the soft tissue tract within the incision. Pressure dressing was applied. Patient tolerated the procedure well and remained hemodynamically stable throughout. No complications were encountered and no  significant blood loss encountered. IMPRESSION: Status post ultrasound guided access right common femoral artery with cerebral angiogram and mechanical thrombectomy for treatment of acute ELVO of right M1, with restoration of TICI 3 flow after 1 pass. Completion angiogram at the right common femoral artery puncture demonstrates proximal occlusion of the superficial femoral artery, and there are absent pedal pulses on Doppler examination at the right foot. Signed, Dulcy Fanny. Earleen Newport DO [facility] Radiology PLAN: Patient will have a noncontrast head CT performed. Patient will have CT angiogram runoff of the abdomen and lower extremities given the absent pedal pulses. Patient will be admitted to ICU maintained on the ventilator. Sheath has been withdrawn from the right common femoral artery, with failure of suture mediated device. Case has been discussed with vascular surgery. Compression dressing should remain for 6 hours. Electronically Signed   By: Corrie Mckusick D.O.   On: 04/25/2017 05:41   Ct Head Code Stroke Wo Contrast  Result Date: 04/25/2017 CLINICAL DATA:  Code stroke.  71 y/o  M; left-sided weakness. EXAM: CT ANGIOGRAPHY HEAD AND NECK CT HEAD CODE STROKE WITHOUT CONTRAST TECHNIQUE: Multidetector CT imaging of the head and neck was performed using the standard protocol during bolus administration of intravenous contrast. Multiplanar CT image reconstructions and MIPs were obtained to evaluate the vascular anatomy. Carotid stenosis measurements (when applicable) are obtained utilizing NASCET criteria, using the distal internal carotid diameter as the denominator. CONTRAST:  50 cc Isovue 370 COMPARISON:  None. FINDINGS: CT HEAD FINDINGS Brain: Small lucency in the right insula with loss of gray-white differentiation (series 3, image 13). Chronic lacunar infarction within the right superior thalamus. Moderate chronic microvascular ischemic changes of white matter  and parenchymal volume loss of the brain. No acute intracranial hemorrhage identified. No hydrocephalus or effacement of basilar cisterns. Vascular: Right M1 hyperdensity. Extensive calcific atherosclerosis of carotid siphons. Skull: Normal. Negative for fracture or focal lesion. Sinuses: Left maxillary sinus mucous retention cyst. Otherwise negative. Orbits: No acute finding. Review of the MIP images confirms the above findings CTA NECK FINDINGS Aortic arch: 4.2 cm ascending aortic aneurysm. Bovine variant anatomy. Right carotid system: No evidence of dissection, stenosis (50% or greater) or occlusion. The mild calcified plaque of the carotid bifurcation and proximal ICA with mild less than 50% stenosis of proximal ICA. 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 Reconstitution of the left internal carotid artery via external carotid collaterals with poor downstream enhancement of the left internal carotid artery to the terminus indicating slow flow. Vertebral arteries: Right V1 occlusion with reconstitution of the right vertebral artery at the V2 segment.  Calcified plaque of left vertebral artery origin with severe stenosis. The left vertebral artery is otherwise normal in caliber through its course in the neck. Skeleton: The cervical degenerative changes greatest at the C4-5 level were disc disease and a small disc bulge results in mild canal stenosis. Other neck: The mediastinal adenopathy in the lower paratracheal and right upper peritracheal stations, probably due to venous congestion from pulmonary edema. Upper chest: Small right pleural effusion. Smooth interlobular septal thickening compatible with interstitial pulmonary edema. Review of the MIP images confirms the above findings CTA HEAD FINDINGS Anterior circulation: Extensive calcified plaque of the right carotid siphon with a short segment of severe distal cavernous and second short segment of paraclinoid severe  stenosis. Poor enhancement of the distal left cavernous and ophthalmic segments may be due to high-grade stenosis or poor antegrade flow due to left common carotid artery occlusion. Right mid M1 occlusion with poor downstream right MCA collateralization. Patent bilateral anterior cerebral arteries and left middle cerebral artery without significant stenosis. Posterior circulation: No significant stenosis, proximal occlusion, aneurysm, or vascular malformation. Mild proximal basilar stenosis. Short segments of mild stenosis in the bilateral P1 segments. Right V4 segment short segment of mild stenosis secondary to calcified plaque. Venous sinuses: As permitted by contrast timing, patent. Anatomic variants: Small anterior communicating artery. No posterior communicating artery identified, likely hypoplastic or absent. 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 ASPECTS = 9 right MCA infarction. No acute hemorrhage. CT head: 1. Small focus of hypoattenuation within right insula compatible with acute infarct, ASPECTS = 9. 2. Moderate chronic microvascular ischemic changes and moderate parenchymal volume loss of the brain. 3. Small chronic lacunar infarct in right superior thalamus. CTA neck: 1. Left common carotid artery occlusion. Reconstitution of left internal carotid artery from external carotid artery collaterals. Poor enhancement of left internal carotid artery indicating slow flow. 2. No significant stenosis of the right carotid system in the neck. 3. Right V1 occlusion with reconstitution of right vertebral artery at the V2 segment. 4. Severe left vertebral artery origin stenosis secondary to calcified plaque. 5. 4.2 cm aortic aneurysm. Recommend annual imaging followup by CTA or MRA. This recommendation follows 2010 ACCF/AHA/AATS/ACR/ASA/SCA/SCAI/SIR/STS/SVM Guidelines for the  Diagnosis and Management of Patients with Thoracic Aortic Disease. Circulation. 2010; 121: I144-R154. 6. Interstitial pulmonary edema. Enlarged mediastinal lymph nodes is likely related to pulmonary edema. CTA head: 1. Right mid M1 occlusion. Poor downstream right MCA collateralization. 2. Poor enhancement of distal left cavernous and ophthalmic segments of ICA may be due to slow flow secondary to common carotid occlusion or underlying high-grade stenosis. 3. Intracranial atherosclerosis with multiple areas of mild stenosis in the anterior and posterior circulation. 4. No additional large vessel occlusion, aneurysm, or evidence for vascular malformation. These results were called by telephone at the time of interpretation on 04/25/2017 at 12:55 am to Dr. Roland Rack , who verbally acknowledged these results. Electronically Signed   By: Kristine Garbe M.D.   On: 04/25/2017 01:14    Labs:  CBC:  Recent Labs  04/17/17 1656 04/22/17 1321 04/24/17 1000 04/25/17 0719 04/25/17 0959  WBC 8.7 7.9 6.9  --  8.1  HGB 14.4 13.1 13.1 12.9* 11.5*  HCT 39.8 36.9* 35.6* 38.0* 32.2*  PLT 241 267 248  --  213    COAGS:  Recent Labs  04/25/17 0100  INR 1.14  APTT 34  BMP:  Recent Labs  04/22/17 1321 04/24/17 1000 04/25/17 0719 04/25/17 0959 04/25/17 1226  NA 129* 126* 129* 128* 128*  K 4.7 4.8 4.6 4.8 4.8  CL 99* 96*  --  101 101  CO2 20* 21*  --  19* 18*  GLUCOSE 140* 173*  --  246* 246*  BUN 19 19  --  18 17  CALCIUM 9.3 9.0  --  8.1* 8.2*  CREATININE 1.11 1.05  --  1.05 1.06  GFRNONAA >60 >60  --  >60 >60  GFRAA >60 >60  --  >60 >60    LIVER FUNCTION TESTS:  Recent Labs  04/17/17 1050 04/25/17 1226  BILITOT 0.7  --   AST 31  --   ALT 34  --   ALKPHOS 71  --   PROT 6.5  --   ALBUMIN 3.5 3.2*    Assessment and Plan: 1. CVA, s/p Cerebral Angiogram Right M1 occlusion thrombectomy  Patient with multiple issues after revascularization overnight.  His R  CFA site is c/d/i.  His pot CT of his head shows no evidence of hemorrhage.  From a NIR standpoint, heparin can be started whenever ok with all other teams.  Since his post procedure CT showed no hemorrhage, he is at low risk for hemorrhagic transformation of his stroke.  Electronically Signed: Henreitta Cea 04/25/2017, 2:40 PM   I spent a total of 15 Minutes at the the patient's bedside AND on the patient's hospital floor or unit, greater than 50% of which was counseling/coordinating care for CVA

## 2017-04-25 NOTE — Sedation Documentation (Signed)
Attempted to place 16 Fr foley catheter per policy. Unable to pass prostate. Coude catheter obtained from ED. Unable to place catheter with coude. Dr. Loreta AveWagner aware and agreeable to proceed without foley catheter.

## 2017-04-25 NOTE — Anesthesia Preprocedure Evaluation (Signed)
Anesthesia Evaluation  Patient identified by MRN, date of birth, ID band  Reviewed: Allergy & Precautions, NPO status , Patient's Chart, lab work & pertinent test results  Airway        Dental   Pulmonary           Cardiovascular hypertension, + CAD and +CHF       Neuro/Psych    GI/Hepatic   Endo/Other  diabetes  Renal/GU      Musculoskeletal   Abdominal   Peds  Hematology   Anesthesia Other Findings   Reproductive/Obstetrics                             Anesthesia Physical Anesthesia Plan  ASA: IV  Anesthesia Plan: General   Post-op Pain Management:    Induction: Intravenous  PONV Risk Score and Plan: 2 and Ondansetron, Dexamethasone and Treatment may vary due to age or medical condition  Airway Management Planned:   Additional Equipment:   Intra-op Plan:   Post-operative Plan: Possible Post-op intubation/ventilation  Informed Consent:   Plan Discussed with: Anesthesiologist and CRNA  Anesthesia Plan Comments:         Anesthesia Quick Evaluation

## 2017-04-25 NOTE — Progress Notes (Addendum)
Progress Note  Patient Name: [name] Date of Encounter: 04/25/2017  Primary Cardiologist: Stanford Breed  Subjective   Overnight had right MCA thrombectomy in the setting of acute stroke, presumed cardioembolic source with right SFA thrombectomy as well in the setting of cold foot. Currently on ventilator comfortable, off of dopamine pressures in the 110 range by arterial line.  Spoke with Gay Filler, his daughter.  Yesterday evening had just eaten was quite comfortable, in fact talked with his wife who is back in [place] on the phone his daughter states. [place] 2 [place]. He wanted to visit his daughter so that he could see his 4 grandchildren. She flew him over here.   In the night, he asked her to help him because he needed to go to the bathroom and he was having trouble moving, she noted him struggling and code stroke was called. CT scan of head performed, interventional radiology perform thrombectomy. Dr. Bridgett Larsson was then called after right foot was cold, unable to access dorsalis pedis and CT scan revealed SFA thrombosis. He was taken to surgery for thrombectomy.   Inpatient Medications    Scheduled Meds: . aspirin EC  81 mg Oral Daily  . atorvastatin  80 mg Per Tube q1800  . chlorhexidine gluconate (MEDLINE KIT)  15 mL Mouth Rinse BID  . feeding supplement (GLUCERNA SHAKE)  237 mL Oral TID BM  . fentaNYL      . furosemide  40 mg Intravenous Q12H  . gelatin adsorbable      . gelatin adsorbable      . insulin aspart  0-9 Units Subcutaneous Q4H  . lidocaine      . losartan  25 mg Per Tube Daily  . mouth rinse  15 mL Mouth Rinse QID  . pantoprazole (PROTONIX) IV  40 mg Intravenous Daily  . potassium chloride  20 mEq Oral BID   Continuous Infusions: . sodium chloride    . sodium chloride    . ceFAZolin    . DOPamine    . propofol (DIPRIVAN) infusion 25 mcg/kg/min (04/25/17 0813)   PRN Meds: acetaminophen, bisacodyl, docusate, fentaNYL (SUBLIMAZE)  injection, ondansetron (ZOFRAN) IV   Vital Signs    Vitals:   04/25/17 0515 04/25/17 0530 04/25/17 0545 04/25/17 0935  BP:      Pulse: 73 75 66   Resp: 17 16 (!) 30   Temp:  97.9 F (36.6 C)    TempSrc:  Axillary    SpO2: 100% 100% 100% 97%  Weight: 193 lb 4.8 oz (87.7 kg)     Height: 6' (1.829 m)       Intake/Output Summary (Last 24 hours) at 04/25/17 0955 Last data filed at 04/25/17 0920  Gross per 24 hour  Intake             1990 ml  Output              700 ml  Net             1290 ml   Filed Weights   04/24/17 1818 04/25/17 0515  Weight: 187 lb 8 oz (85 kg) 193 lb 4.8 oz (87.7 kg)    Telemetry    Sinus rhythm - Personally Reviewed  ECG    EKG from operating room reviewed and compared to prior EKG, there are T-wave inversions noted in the lateral precordial leads as a result of his cardiomyopathy, no significant changes, J-point elevation noted in the early  precordial leads similar to prior EKG- Personally Reviewed  Physical Exam   GEN: Ill-appearing, on vent, sedate  Neck:  difficult to assess JVD Cardiac: RRR, no murmurs, rubs, or gallops.  Respiratory:  vent noise bilaterally. GI: Soft, nontender, non-distended  MS: No edema; No deformity. Cool right foot Neuro:   unable to assess Psych: Unable to assess  Labs    Chemistry Recent Labs Lab 04/20/17 0605 04/22/17 1321 04/24/17 1000 04/25/17 0719  NA 134* 129* 126* 129*  K 4.2 4.7 4.8 4.6  CL 99* 99* 96*  --   CO2 26 20* 21*  --   GLUCOSE 171* 140* 173*  --   BUN '20 19 19  ' --   CREATININE 1.11 1.11 1.05  --   CALCIUM 9.3 9.3 9.0  --   GFRNONAA >60 >60 >60  --   GFRAA >60 >60 >60  --   ANIONGAP '9 10 9  ' --      Hematology Recent Labs Lab 04/22/17 1321 04/24/17 1000 04/25/17 0719  WBC 7.9 6.9  --   RBC 5.30 5.12  --   HGB 13.1 13.1 12.9*  HCT 36.9* 35.6* 38.0*  MCV 69.6* 69.5*  --   MCH 24.7* 25.6*  --   MCHC 35.5 36.8*  --   RDW 14.2 14.0  --   PLT 267 248  --     Cardiac  Enzymes Recent Labs Lab 04/24/17 1000  TROPONINI 0.05*    Recent Labs Lab 04/24/17 1012  TROPIPOC 0.11*     BNP Recent Labs Lab 04/24/17 1000  BNP 832.8*     DDimer  Recent Labs Lab 04/24/17 1000  DDIMER 0.92*     Radiology    Ct Angio Head W Or Wo Contrast  Result Date: 04/25/2017 CLINICAL DATA:  Code stroke.  71 y/o  M; left-sided weakness. EXAM: CT ANGIOGRAPHY HEAD AND NECK CT HEAD CODE STROKE WITHOUT CONTRAST TECHNIQUE: Multidetector CT imaging of the head and neck was performed using the standard protocol during bolus administration of intravenous contrast. Multiplanar CT image reconstructions and MIPs were obtained to evaluate the vascular anatomy. Carotid stenosis measurements (when applicable) are obtained utilizing NASCET criteria, using the distal internal carotid diameter as the denominator. CONTRAST:  50 cc Isovue 370 COMPARISON:  None. FINDINGS: CT HEAD FINDINGS Brain: Small lucency in the right insula with loss of gray-white differentiation (series 3, image 13). Chronic lacunar infarction within the right superior thalamus. Moderate chronic microvascular ischemic changes of white matter and parenchymal volume loss of the brain. No acute intracranial hemorrhage identified. No hydrocephalus or effacement of basilar cisterns. Vascular: Right M1 hyperdensity. Extensive calcific atherosclerosis of carotid siphons. Skull: Normal. Negative for fracture or focal lesion. Sinuses: Left maxillary sinus mucous retention cyst. Otherwise negative. Orbits: No acute finding. Review of the MIP images confirms the above findings CTA NECK FINDINGS Aortic arch: 4.2 cm ascending aortic aneurysm. Bovine variant anatomy. Right carotid system: No evidence of dissection, stenosis (50% or greater) or occlusion. The mild calcified plaque of the carotid bifurcation and proximal ICA with mild less than 50% stenosis of proximal ICA. Left carotid system: Occlusion of left common carotid artery to the  bifurcation. Reconstitution of the left internal carotid artery via external carotid collaterals with poor downstream enhancement of the left internal carotid artery to the terminus indicating slow flow. Vertebral arteries: Right V1 occlusion with reconstitution of the right vertebral artery at the V2 segment. Calcified plaque of left vertebral artery origin with severe stenosis. The  left vertebral artery is otherwise normal in caliber through its course in the neck. Skeleton: The cervical degenerative changes greatest at the C4-5 level were disc disease and a small disc bulge results in mild canal stenosis. Other neck: The mediastinal adenopathy in the lower paratracheal and right upper peritracheal stations, probably due to venous congestion from pulmonary edema. Upper chest: Small right pleural effusion. Smooth interlobular septal thickening compatible with interstitial pulmonary edema. Review of the MIP images confirms the above findings CTA HEAD FINDINGS Anterior circulation: Extensive calcified plaque of the right carotid siphon with a short segment of severe distal cavernous and second short segment of paraclinoid severe stenosis. Poor enhancement of the distal left cavernous and ophthalmic segments may be due to high-grade stenosis or poor antegrade flow due to left common carotid artery occlusion. Right mid M1 occlusion with poor downstream right MCA collateralization. Patent bilateral anterior cerebral arteries and left middle cerebral artery without significant stenosis. Posterior circulation: No significant stenosis, proximal occlusion, aneurysm, or vascular malformation. Mild proximal basilar stenosis. Short segments of mild stenosis in the bilateral P1 segments. Right V4 segment short segment of mild stenosis secondary to calcified plaque. Venous sinuses: As permitted by contrast timing, patent. Anatomic variants: Small anterior communicating artery. No posterior communicating artery identified, likely  hypoplastic or absent. 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 ASPECTS = 9 right MCA infarction. No acute hemorrhage. CT head: 1. Small focus of hypoattenuation within right insula compatible with acute infarct, ASPECTS = 9. 2. Moderate chronic microvascular ischemic changes and moderate parenchymal volume loss of the brain. 3. Small chronic lacunar infarct in right superior thalamus. CTA neck: 1. Left common carotid artery occlusion. Reconstitution of left internal carotid artery from external carotid artery collaterals. Poor enhancement of left internal carotid artery indicating slow flow. 2. No significant stenosis of the right carotid system in the neck. 3. Right V1 occlusion with reconstitution of right vertebral artery at the V2 segment. 4. Severe left vertebral artery origin stenosis secondary to calcified plaque. 5. 4.2 cm aortic aneurysm. Recommend annual imaging followup by CTA or MRA. This recommendation follows 2010 ACCF/AHA/AATS/ACR/ASA/SCA/SCAI/SIR/STS/SVM Guidelines for the Diagnosis and Management of Patients with Thoracic Aortic Disease. Circulation. 2010; 121: U882-C003. 6. Interstitial pulmonary edema. Enlarged mediastinal lymph nodes is likely related to pulmonary edema. CTA head: 1. Right mid M1 occlusion. Poor downstream right MCA collateralization. 2. Poor enhancement of distal left cavernous and ophthalmic segments of ICA may be due to slow flow secondary to common carotid occlusion or underlying high-grade stenosis. 3. Intracranial atherosclerosis with multiple areas of mild stenosis in the anterior and posterior circulation. 4. No additional large vessel occlusion, aneurysm, or evidence for vascular malformation. These results were called by telephone at the time of interpretation on 04/25/2017 at 12:55 am to Dr. Roland Rack , who verbally acknowledged  these results. Electronically Signed   By: Kristine Garbe M.D.   On: 04/25/2017 01:14   Dg Chest 2 View  Result Date: 04/24/2017 CLINICAL DATA:  Short of breath EXAM: CHEST  2 VIEW COMPARISON:  04/17/2017 FINDINGS: Cardiac enlargement without heart failure. Negative for infiltrate or effusion. Negative for mass or adenopathy. Mild right lower lobe atelectasis IMPRESSION: Cardiac enlargement without heart failure Mild right lower lobe atelectasis. Electronically Signed   By: Franchot Gallo M.D.   On: 04/24/2017 10:34   Ct Head Wo Contrast  Result Date: 04/25/2017 CLINICAL  DATA:  71 y/o M; post transarterial intervention of stroke. EXAM: CT HEAD WITHOUT CONTRAST TECHNIQUE: Contiguous axial images were obtained from the base of the skull through the vertex without intravenous contrast. COMPARISON:  04/25/2017 CT head FINDINGS: Brain: Stable small lucency within the right insula compatible with region of acute infarction. No new area of loss of gray-white differentiation to indicate interval infarction. No acute hemorrhage. Stable background of moderate chronic microvascular ischemic changes and parenchymal volume loss of the brain. No hydrocephalus, extra-axial collection, or effacement of basilar cisterns. Vascular: The persisting contrast opacification of the dural venous sinuses and circle of Willis. Skull: Normal. Negative for fracture or focal lesion. Sinuses/Orbits: The left maxillary sinus mucous retention cyst. Fluid within the nasal and oropharynx is likely due to intubation. Other: None. IMPRESSION: Stable small lucency within the right insula compatible with region of acute infarction. No new area of infarction identified. No acute hemorrhage. Electronically Signed   By: Kristine Garbe M.D.   On: 04/25/2017 04:44   Ct Angio Neck W Or Wo Contrast  Result Date: 04/25/2017 CLINICAL DATA:  Code stroke.  71 y/o  M; left-sided weakness. EXAM: CT ANGIOGRAPHY HEAD AND NECK CT HEAD CODE  STROKE WITHOUT CONTRAST TECHNIQUE: Multidetector CT imaging of the head and neck was performed using the standard protocol during bolus administration of intravenous contrast. Multiplanar CT image reconstructions and MIPs were obtained to evaluate the vascular anatomy. Carotid stenosis measurements (when applicable) are obtained utilizing NASCET criteria, using the distal internal carotid diameter as the denominator. CONTRAST:  50 cc Isovue 370 COMPARISON:  None. FINDINGS: CT HEAD FINDINGS Brain: Small lucency in the right insula with loss of gray-white differentiation (series 3, image 13). Chronic lacunar infarction within the right superior thalamus. Moderate chronic microvascular ischemic changes of white matter and parenchymal volume loss of the brain. No acute intracranial hemorrhage identified. No hydrocephalus or effacement of basilar cisterns. Vascular: Right M1 hyperdensity. Extensive calcific atherosclerosis of carotid siphons. Skull: Normal. Negative for fracture or focal lesion. Sinuses: Left maxillary sinus mucous retention cyst. Otherwise negative. Orbits: No acute finding. Review of the MIP images confirms the above findings CTA NECK FINDINGS Aortic arch: 4.2 cm ascending aortic aneurysm. Bovine variant anatomy. Right carotid system: No evidence of dissection, stenosis (50% or greater) or occlusion. The mild calcified plaque of the carotid bifurcation and proximal ICA with mild less than 50% stenosis of proximal ICA. 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 Reconstitution of the left internal carotid artery via external carotid collaterals with poor downstream enhancement of the left internal carotid artery to the terminus indicating slow flow. Vertebral arteries: Right V1 occlusion with reconstitution of the right vertebral artery at the V2 segment. Calcified plaque of left vertebral artery origin with severe stenosis. The left vertebral artery is otherwise  normal in caliber through its course in the neck. Skeleton: The cervical degenerative changes greatest at the C4-5 level were disc disease and a small disc bulge results in mild canal stenosis. Other neck: The mediastinal adenopathy in the lower paratracheal and right upper peritracheal stations, probably due to venous congestion from pulmonary edema. Upper chest: Small right pleural effusion. Smooth interlobular septal thickening compatible with interstitial pulmonary edema. Review of the MIP images confirms the above findings CTA HEAD FINDINGS Anterior circulation: Extensive calcified plaque of the right carotid siphon with a short segment of severe distal cavernous and second short segment of paraclinoid severe stenosis. Poor enhancement of the distal left cavernous and  ophthalmic segments may be due to high-grade stenosis or poor antegrade flow due to left common carotid artery occlusion. Right mid M1 occlusion with poor downstream right MCA collateralization. Patent bilateral anterior cerebral arteries and left middle cerebral artery without significant stenosis. Posterior circulation: No significant stenosis, proximal occlusion, aneurysm, or vascular malformation. Mild proximal basilar stenosis. Short segments of mild stenosis in the bilateral P1 segments. Right V4 segment short segment of mild stenosis secondary to calcified plaque. Venous sinuses: As permitted by contrast timing, patent. Anatomic variants: Small anterior communicating artery. No posterior communicating artery identified, likely hypoplastic or absent. 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 ASPECTS = 9 right MCA infarction. No acute hemorrhage. CT head: 1. Small focus of hypoattenuation within right insula compatible with acute infarct, ASPECTS = 9. 2. Moderate chronic microvascular ischemic changes and  moderate parenchymal volume loss of the brain. 3. Small chronic lacunar infarct in right superior thalamus. CTA neck: 1. Left common carotid artery occlusion. Reconstitution of left internal carotid artery from external carotid artery collaterals. Poor enhancement of left internal carotid artery indicating slow flow. 2. No significant stenosis of the right carotid system in the neck. 3. Right V1 occlusion with reconstitution of right vertebral artery at the V2 segment. 4. Severe left vertebral artery origin stenosis secondary to calcified plaque. 5. 4.2 cm aortic aneurysm. Recommend annual imaging followup by CTA or MRA. This recommendation follows 2010 ACCF/AHA/AATS/ACR/ASA/SCA/SCAI/SIR/STS/SVM Guidelines for the Diagnosis and Management of Patients with Thoracic Aortic Disease. Circulation. 2010; 121: L491-P915. 6. Interstitial pulmonary edema. Enlarged mediastinal lymph nodes is likely related to pulmonary edema. CTA head: 1. Right mid M1 occlusion. Poor downstream right MCA collateralization. 2. Poor enhancement of distal left cavernous and ophthalmic segments of ICA may be due to slow flow secondary to common carotid occlusion or underlying high-grade stenosis. 3. Intracranial atherosclerosis with multiple areas of mild stenosis in the anterior and posterior circulation. 4. No additional large vessel occlusion, aneurysm, or evidence for vascular malformation. These results were called by telephone at the time of interpretation on 04/25/2017 at 12:55 am to Dr. Roland Rack , who verbally acknowledged these results. Electronically Signed   By: Kristine Garbe M.D.   On: 04/25/2017 01:14   Ct Angio Ao+bifem W & Or Wo Contrast  Result Date: 04/25/2017 CLINICAL DATA:  71 year old male with a history of admission for congestive heart failure 24 hours prior to acute inpatient clinical stroke with emergent large vessel occlusion of the right middle cerebral artery. Status post treatment with both  mechanical thrombectomy and systemic, weight based dose of tPA, the right MCA flow has been restored, however, right foot pulses were discovered to be absent. Diagnostic CT imaging was performed to evaluate the right lower extremity arterial system. EXAM: CT ANGIOGRAPHY OF ABDOMINAL AORTA WITH ILIOFEMORAL RUNOFF TECHNIQUE: Multidetector CT imaging of the abdomen, pelvis and lower extremities was performed using the standard protocol during bolus administration of intravenous contrast. Multiplanar CT image reconstructions and MIPs were obtained to evaluate the vascular anatomy. CONTRAST:  100 cc Isovue 3 7 COMPARISON:  Chest CT 04/18/2017 FINDINGS: VASCULAR Aorta: Atherosclerotic plaque of the abdominal aorta with mixed calcified and soft plaque. No aneurysm or dissection of the visualized lower abdominal aorta. IMA: Inferior mesenteric artery is patent. Right lower extremity: Mild soft and calcified plaque of the right iliac system. Right common iliac artery measures 13 mm. No dissection. Hypogastric  artery is patent. Moderate atherosclerotic changes of the right hypogastric system. Right external iliac artery patent with mild atherosclerotic changes. Irregular intimal contour of the right common femoral artery with luminal narrowing, at the site of recent arterial access. Common femoral artery remains patent throughout its course to the bifurcation. Profunda femoris remains patent, as well as the thigh branches. There is proximal occlusion of the superficial femoral artery. Circumferential calcified plaque of the length of the superficial femoral artery with background of atherosclerotic changes extending through the popliteal artery. No contrast is visualized through the proximal 2/3 of the SFA. Collaterals partially fill the distal SFA and above knee popliteal artery. No contrast identified through the length of the tibial vessels. Baseline calcified disease present through the length of the tibial vessels  including predominantly anterior tibial artery and posterior tibial artery. Left lower extremity: Mixed calcified and soft plaque of the left iliac system. Diameter of the left common iliac artery measures 12 mm. No dissection flap. Hypogastric arteries patent with moderate atherosclerotic changes. External iliac artery patent with mild plaque. Common femoral artery patent with mild plaque. Profunda femoris patent as well as the thigh branches. Superficial femoral artery demonstrates mild to moderate calcified and soft plaque with no significant stenosis identified. Popliteal artery appears patent through its length with mild plaque. There is decrease contrast column within the distal popliteal artery, with decrease contrast filling of the tibial vessels. There does appear to be discontinuous contrast flow of the peroneal artery to the ankle, with questionable filling of the anterior tibial and posterior tibial vessels. Baseline calcified disease of the tibial vessels, predominantly anterior tibial artery and posterior tibial artery. Veins: Unremarkable appearance of the venous system. Review of the MIP images confirms the above findings. NON-VASCULAR Urinary tract: Incomplete visualization of the bilateral kidneys. Likely Bosniak 1 cyst at the inferior left kidney. Excreted contrast within the urinary bladder. Stomach/Bowel: Unremarkable appearance of visualized small bowel. Unremarkable appearance of the visualized colon. Lymphatic: Multiple lymph nodes in the para-aortic nodal station, none of which are enlarged. Mesenteric: No adenopathy. Reproductive: Prostate diameter not enlarged. Other: Gas within the anterior abdominal soft tissues, potentially secondary to injections. Compression dressing on the right common femoral region. Musculoskeletal: Degenerative changes of the lower lumbar spine. No displaced fracture. IMPRESSION: Occlusion of the right SFA just after the origin, with patent stump. There is minimal  collateral filling of the distal SFA and popliteal artery via patent profunda femoris. There is also absent contrast flow within the right-sided tibial vasculature to the ankle. On the left, contrast flow is maintained through the popliteal artery, however, there is decrease contrast within the anterior tibial artery and posterior tibial artery. There appears to be at least partial filling of the peroneal artery to the ankle. This may be related to additional emboli versus baseline vascular disease. Moderate atherosclerotic changes of the bilateral iliac, femoral-popliteal, and tibial vasculature, with calcifications through the length of the right SFA, right popliteal artery, and bilateral tibial arteries. Irregularity of the intimal surface of the right common femoral artery, which is patent with mild narrowing, at the site of recent femoral arterial sheath access. No evidence of hematoma with compression dressing in place. Signed, Dulcy Fanny. Earleen Newport, DO [facility] Radiology Electronically Signed   By: Corrie Mckusick D.O.   On: 04/25/2017 05:06   Dg Chest Port 1 View  Result Date: 04/25/2017 CLINICAL DATA:  Endotracheal intubation. EXAM: PORTABLE CHEST 1 VIEW COMPARISON:  04/24/2017 FINDINGS: Postoperative changes  in the mediastinum. Endotracheal tube with tip measuring 3.5 cm above the carina. Enteric tube tip is off the field of view but below the left hemidiaphragm. Shallow inspiration. Cardiac enlargement. Mild developing pulmonary vascular congestion. Possible early perihilar edema. No blunting of costophrenic angles. No pneumothorax. IMPRESSION: Appliances appear in satisfactory location. Cardiac enlargement with mild developing pulmonary vascular congestion and possible early perihilar edema. Electronically Signed   By: Lucienne Capers M.D.   On: 04/25/2017 05:40   Ir Percutaneous Art Thrombectomy/infusion Intracranial Inc Diag Angio  Result Date:  04/25/2017 INDICATION: 71 year old male admitted with a history of congestive heart failure. Acute mental status change recognized at approximately mid night, with last known well of 10:30 p.m. with acute code stroke activation. Imaging demonstrates right M1 occlusion corresponding to the patient's dense left-sided symptoms. EXAM: ULTRASOUND GUIDED ACCESS RIGHT COMMON FEMORAL ARTERY CERVICAL AND CEREBRAL ANGIOGRAM MECHANICAL THROMBECTOMY RIGHT MCA OCCLUSION COMPARISON:  CT AND CT ANGIOGRAM IMAGING OF THE SAME DATE MEDICATIONS: None ANESTHESIA/SEDATION: General endotracheal anesthesia CONTRAST:  40 cc Isovue FLUOROSCOPY TIME:  Fluoroscopy Time: 12 minutes 18 seconds (839 mGy). COMPLICATIONS: None TECHNIQUE: Informed written consent was obtained from the patient's family after a thorough discussion of the procedural risks, benefits and alternatives. Specific risks discussed include: Bleeding, infection, contrast reaction, kidney injury/failure, need for further procedure/surgery, arterial injury or dissection, embolization to new territory, intracranial hemorrhage (10-15% risk), neurologic deterioration, cardiopulmonary collapse, death. All questions were addressed. Maximal Sterile Barrier Technique was utilized including during the procedure including caps, mask, sterile gowns, sterile gloves, sterile drape, hand hygiene and skin antiseptic. A timeout was performed prior to the initiation of the procedure. FINDINGS: Initial: Right common carotid artery:  Normal course caliber and contour. Right external carotid artery: Patent with antegrade flow. Right internal carotid artery: Mildly tortuous right cervical ICA with no significant atherosclerotic changes at the carotid bulb. No significant stenosis. Right MCA: Initial angiogram demonstrates proximal occlusion of the right middle cerebral artery. There is an early downgoing temporal branch which remains patent. Initial flow is TICI 0. Moderate collateral flow into the  MCA territory from [facility] and transcranial branches. Right ACA: A 1 segment patent. A 2 segment perfuses the right ACA territory. Cross filling of the patent anterior communicating artery with the right-sided carotid flow perfusing the left anterior cerebral artery territory. There is significant competing arterial flow from the left anterior cerebral artery, with to and fro flow of the left A1 segment, compatible with adequate collateralization of the left MCA system. Late phase of the right common injection demonstrates partial opacification of the left cervical ICA and proximal MCA. Completion: After single pass of mechanical thrombectomy stent solitaire device, there is restoration of TICI 3 flow of the right MCA territory. Femoral artery angiogram demonstrates patent common femoral artery with appropriate puncture site. Profunda femoris patent with occlusion of the proximal superficial femoral artery. No Doppler signal of the right pedal pulses at the completion of the study. PROCEDURE: The patient is brought to the interventional radiology suite and placed in the supine position on the IR table. The anesthesia team was present for induction of general endotracheal anesthesia. Anesthesia team initiated a left radial arterial line. After patient preparation was performed and a time-out procedure to confirm patient and procedure and consent, ultrasound survey of the right inguinal region was performed with images stored and sent to PACs. 11 blade scalpel was used to make a small incision. Blunt dissection was performed. A micropuncture needle was used access the right common  femoral artery under ultrasound. With excellent arterial blood flow returned, an .018 micro wire was passed through the needle, observed to enter the abdominal aorta under fluoroscopy. The needle was removed, and a micropuncture sheath was placed over the wire. The inner dilator and wire were removed, and an 035 Bentson wire was advanced under  fluoroscopy into the abdominal aorta. The sheath was removed and a standard 5 [place] vascular sheath was placed. The dilator was removed and the sheath was flushed. A 37F JB-1 diagnostic catheter was advanced over the wire to the proximal descending thoracic aorta. Wire was then removed. Double flush of the catheter was performed. Catheter was then used to navigate through the innominate artery and into the right common carotid artery. Formal angiogram was performed. Exchange length Rosen wire was then passed through the diagnostic catheter to the distal common carotid artery and into the proximal right internal carotid artery and the diagnostic catheter was removed. The 5 French sheath was removed and exchanged for 8 French 55 centimeter BrightTip sheath. Sheath was flushed and attached to pressurized and heparinized saline bag for constant forward flow. Then an 8 [place], 85 cm Flowgate balloon tip catheter was prepared on the back table with inflation of the balloon with 50/50 concentration of dilute contrast. The balloon catheter was then advanced over the wire, positioned into the internal carotid artery. Copious back flush was performed and the balloon catheter was attached to heparinized and pressurized saline bag for forward flow. Angiogram was performed for road map guide. Microcatheter system was then introduced through the balloon guide catheter, using a synchro soft 014 wire and a Trevo Provue18 catheter. Microcatheter system was advanced into the internal carotid artery, to the level of the occlusion. The micro wire was then carefully advanced through the occluded segment. Microcatheter was then push through the occluded segment and the wire was removed. Blood was then aspirated through the hub of the microcatheter, and a gentle contrast injection was performed confirming intraluminal position. A rotating hemostatic valve was then attached to the back end of the microcatheter, and a pressurized and  heparinized saline bag was attached to the catheter. 4 x 40 solitaire device was then selected. Back flush was achieved at the rotating hemostatic valve, and then the device was gently advanced through the microcatheter to the distal end. The retriever was then unsheathed by withdrawing the microcatheter under fluoroscopy. Once the retriever was completely unsheathed, a 3 minute time period was observed. The balloon at the balloon tip catheter was then inflated under fluoroscopy for proximal flow arrest. Constant aspiration was then performed at the tip of the balloon guide catheter as the retriever was gently and slowly withdrawn with fluoroscopic observation. Once the retriever was entirely removed from the system, free aspiration was confirmed at the hub of the balloon guide catheter, with free blood return confirmed. The balloon was then deflated, rotating hemostatic valve was reattached, and a control angiogram was performed. Control angiogram was performed at the right common femoral artery puncture site. The 55 centimeter 8 French sheath was removed over roadrunner 035 wire. Abbott Proglide suture mediated closure was attempted, with failure of the suture. Manual pressure was applied. Gel-Foam was inserted into the soft tissue tract within the incision. Pressure dressing was applied. Patient tolerated the procedure well and remained hemodynamically stable throughout. No complications were encountered and no significant blood loss encountered. IMPRESSION: Status post ultrasound guided access right common femoral artery with cerebral angiogram and mechanical thrombectomy for treatment of acute  ELVO of right M1, with restoration of TICI 3 flow after 1 pass. Completion angiogram at the right common femoral artery puncture demonstrates proximal occlusion of the superficial femoral artery, and there are absent pedal pulses on Doppler examination at the right foot. Signed, Dulcy Fanny. Earleen Newport DO [facility] Radiology PLAN: Patient will have a noncontrast head CT performed. Patient will have CT angiogram runoff of the abdomen and lower extremities given the absent pedal pulses. Patient will be admitted to ICU maintained on the ventilator. Sheath has been withdrawn from the right common femoral artery, with failure of suture mediated device. Case has been discussed with vascular surgery. Compression dressing should remain for 6 hours. Electronically Signed   By: Corrie Mckusick D.O.   On: 04/25/2017 05:41   Ct Head Code Stroke Wo Contrast  Result Date: 04/25/2017 CLINICAL DATA:  Code stroke.  71 y/o  M; left-sided weakness. EXAM: CT ANGIOGRAPHY HEAD AND NECK CT HEAD CODE STROKE WITHOUT CONTRAST TECHNIQUE: Multidetector CT imaging of the head and neck was performed using the standard protocol during bolus administration of intravenous contrast. Multiplanar CT image reconstructions and MIPs were obtained to evaluate the vascular anatomy. Carotid stenosis measurements (when applicable) are obtained utilizing NASCET criteria, using the distal internal carotid diameter as the denominator. CONTRAST:  50 cc Isovue 370 COMPARISON:  None. FINDINGS: CT HEAD FINDINGS Brain: Small lucency in the right insula with loss of gray-white differentiation (series 3, image 13). Chronic lacunar infarction within the right superior thalamus. Moderate chronic microvascular ischemic changes of white matter and parenchymal volume loss of the brain. No acute intracranial hemorrhage identified. No hydrocephalus or effacement of basilar cisterns. Vascular: Right M1 hyperdensity. Extensive calcific atherosclerosis of carotid siphons. Skull: Normal. Negative for fracture or focal lesion. Sinuses: Left maxillary sinus mucous retention cyst. Otherwise negative. Orbits: No acute finding. Review of the MIP images confirms the above findings CTA NECK FINDINGS Aortic arch: 4.2 cm ascending aortic aneurysm.  Bovine variant anatomy. Right carotid system: No evidence of dissection, stenosis (50% or greater) or occlusion. The mild calcified plaque of the carotid bifurcation and proximal ICA with mild less than 50% stenosis of proximal ICA. 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 Reconstitution of the left internal carotid artery via external carotid collaterals with poor downstream enhancement of the left internal carotid artery to the terminus indicating slow flow. Vertebral arteries: Right V1 occlusion with reconstitution of the right vertebral artery at the V2 segment. Calcified plaque of left vertebral artery origin with severe stenosis. The left vertebral artery is otherwise normal in caliber through its course in the neck. Skeleton: The cervical degenerative changes greatest at the C4-5 level were disc disease and a small disc bulge results in mild canal stenosis. Other neck: The mediastinal adenopathy in the lower paratracheal and right upper peritracheal stations, probably due to venous congestion from pulmonary edema. Upper chest: Small right pleural effusion. Smooth interlobular septal thickening compatible with interstitial pulmonary edema. Review of the MIP images confirms the above findings CTA HEAD FINDINGS Anterior circulation: Extensive calcified plaque of the right carotid siphon with a short segment of severe distal cavernous and second short segment of paraclinoid severe stenosis. Poor enhancement of the distal left cavernous and ophthalmic segments may be due to high-grade stenosis or poor antegrade flow due to left common carotid artery occlusion. Right mid M1 occlusion with poor downstream right MCA collateralization. Patent bilateral anterior cerebral arteries and left middle cerebral  artery without significant stenosis. Posterior circulation: No significant stenosis, proximal occlusion, aneurysm, or vascular malformation. Mild proximal basilar stenosis. Short  segments of mild stenosis in the bilateral P1 segments. Right V4 segment short segment of mild stenosis secondary to calcified plaque. Venous sinuses: As permitted by contrast timing, patent. Anatomic variants: Small anterior communicating artery. No posterior communicating artery identified, likely hypoplastic or absent. 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 ASPECTS = 9 right MCA infarction. No acute hemorrhage. CT head: 1. Small focus of hypoattenuation within right insula compatible with acute infarct, ASPECTS = 9. 2. Moderate chronic microvascular ischemic changes and moderate parenchymal volume loss of the brain. 3. Small chronic lacunar infarct in right superior thalamus. CTA neck: 1. Left common carotid artery occlusion. Reconstitution of left internal carotid artery from external carotid artery collaterals. Poor enhancement of left internal carotid artery indicating slow flow. 2. No significant stenosis of the right carotid system in the neck. 3. Right V1 occlusion with reconstitution of right vertebral artery at the V2 segment. 4. Severe left vertebral artery origin stenosis secondary to calcified plaque. 5. 4.2 cm aortic aneurysm. Recommend annual imaging followup by CTA or MRA. This recommendation follows 2010 ACCF/AHA/AATS/ACR/ASA/SCA/SCAI/SIR/STS/SVM Guidelines for the Diagnosis and Management of Patients with Thoracic Aortic Disease. Circulation. 2010; 121: Z662-H476. 6. Interstitial pulmonary edema. Enlarged mediastinal lymph nodes is likely related to pulmonary edema. CTA head: 1. Right mid M1 occlusion. Poor downstream right MCA collateralization. 2. Poor enhancement of distal left cavernous and ophthalmic segments of ICA may be due to slow flow secondary to common carotid occlusion or underlying high-grade stenosis. 3. Intracranial atherosclerosis with multiple  areas of mild stenosis in the anterior and posterior circulation. 4. No additional large vessel occlusion, aneurysm, or evidence for vascular malformation. These results were called by telephone at the time of interpretation on 04/25/2017 at 12:55 am to Dr. Roland Rack , who verbally acknowledged these results. Electronically Signed   By: Kristine Garbe M.D.   On: 04/25/2017 01:14    Cardiac Studies   Personally reviewed echocardiogram from last week-no thrombus in left ventricle. EF 20-25%  Patient Profile     71 y.o. male  Readmitted with acute on chronic systolic heart failure who suffered an acute in-hospital right MCA stroke, presumed cardioembolic status post thrombectomy of MCA by interventional radiology with subsequent superior femoral artery occlusion status post thrombectomy by Dr. Bridgett Larsson of vascular surgery, ejection fraction 25%  Assessment & Plan    Acute on chronic systolic heart failure   - he received IV Lasix last night and in the emergency room and showed improvement clinically in the evening with less shortness of breath and decreased orthopnea  - For now, we will hold unless he begins to demonstrate signs of pulmonary edema or increased oxygenation  - No longer is he requiring dopamine for blood pressure support.  - We will hold his carvedilol 3.125 and his losartan 25 mg  - Stopping potassium  - Trying to allow for higher blood pressure to allow for improved perfusion post stroke  Acute right MCA stroke  - Likely cardioembolic source  - Rechecking echocardiogram limited  - Interventional radiology/neurology-appreciate  - ASA    Acute right superficial femoral artery thrombus  - Status post thrombectomy, Dr. Bridgett Larsson. The distal popliteal artery and bifurcation into the anterior tibial artery and tibioperoneal trunk was essentially chronically occluded. He was able  to retrieve subacute thrombus from the femoral-popliteal artery.  - I reviewed EKG, ST  segment changes chronic in the early precordial leads. On telemetry, certainly they may of been accentuated. His EKG at baseline is markedly abnormal.  - Repeating echocardiogram  - Continue to monitor. Right foot remains cool  - Question for multiple teams is when can heparin IV be utilized? Of course there is risk for hemorrhagic transformation of the stroke.   Acute respiratory failure  - Currently on ventilator for support in the setting of acute stroke  - Management per critical care team  Expressed to daughter, Gay Filler, the critical nature of his combined illnesses and troublesome prognosis.  Critical care time 40 minutes spent with extensive data review, discussion with family, nursing team in this gentleman with multisystem organ failure.   Signed, Candee Furbish, MD  04/25/2017, 9:55 AM

## 2017-04-25 NOTE — Progress Notes (Signed)
   Daily Progress Note  Intraoperative findings consistent with acute on chronic peripheral arterial disease with likely in-situ thrombosis rather than thromboembolism.  The distal popliteal artery and bifurcation into anterior tibial artery and tibioperoneal trunk was essentially chronically occluded.  I retrieved sub-acute thrombus from the femoropopliteal artery and had return of pulse to below-the-knee popliteal artery.  The perfusion via tibioperoneal trunk is marginal at best.  I suspect this patient had extensive tibial collaterals preoperatively given the relatively preserved status of his right foot.   Intraoperatively he developed acute ST segment elevation, so I elected to abort the procedure after the thrombectomy and endarterectomy and patch angioplasty of the below-the-knee popliteal artery was completed.    - Cardiology will be contacted - EKG ordered - Serial cardiac enzymes ordered -  Attempted to call daughter at 820 AM.  Will try to reach her again.   Leonides SakeBrian Jhalil Silvera, MD, FACS [facility] [place] Office: [phone] Pager: [phone]  04/25/2017, 8:20 AM

## 2017-04-25 NOTE — Progress Notes (Signed)
PULMONARY / CRITICAL CARE MEDICINE   Name: [name] MRN: [id number] DOB: March 30, 1946    ADMISSION DATE:  04/24/2017 CONSULTATION DATE:  04/25/17  REFERRING MD:  Dr. Loreta Ave  CHIEF COMPLAINT:  Vent management  HISTORY OF PRESENT ILLNESS:   71 yo male developed shortness of breath, facial droop, Lt side weakness.  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  Given tPA, intubated for airway protection, and had thrombectomy of Rt M1.  Developed Rt leg ischemia after procedure likely from embolic event.  PMHx of CAD, ischemic CM, HTN, DM, HLD, CVA.  SUBJECTIVE:  Remains on full vent support.  VITAL SIGNS: BP 128/87   Pulse 66   Temp 97.9 F (36.6 C) (Axillary)   Resp (!) 30   Ht 6' (1.829 m)   Wt 193 lb 4.8 oz (87.7 kg)   SpO2 97%   BMI 26.22 kg/m   VENTILATOR SETTINGS: Vent Mode: PRVC FiO2 (%):  [40 %-60 %] 40 % Set Rate:  [12 bmp] 12 bmp Vt Set:  [550 mL] 550 mL PEEP:  [5 cmH20] 5 cmH20 Plateau Pressure:  [19 cmH20] 19 cmH20  INTAKE / OUTPUT: I/O last 3 completed shifts: In: 990 [P.O.:240; I.V.:500; IV Piggyback:250] Out: 650 [Urine:550; Blood:100]  PHYSICAL EXAMINATION:  General - on vent Eyes - pupils reactive ENT - ETT in place Cardiac - regular, no murmur Chest - no wheeze, rales Abd - soft, non tender Ext - 1+ edema, drain in Rt leg Skin - no rashes Neuro - sedated   LABS:  BMET  Recent Labs Lab 04/20/17 0605 04/22/17 1321 04/24/17 1000 04/25/17 0719  NA 134* 129* 126* 129*  K 4.2 4.7 4.8 4.6  CL 99* 99* 96*  --   CO2 26 20* 21*  --   BUN 20 19 19   --   CREATININE 1.11 1.11 1.05  --   GLUCOSE 171* 140* 173*  --     Electrolytes  Recent Labs Lab 04/20/17 0605 04/22/17 1321 04/24/17 1000  CALCIUM 9.3 9.3 9.0    CBC  Recent Labs Lab 04/22/17 1321 04/24/17 1000 04/25/17 0719 04/25/17 0959  WBC 7.9 6.9  --  8.1  HGB 13.1 13.1 12.9* 11.5*  HCT 36.9* 35.6* 38.0* 32.2*  PLT 267 248  --  213     Coag's  Recent Labs Lab 04/25/17 0100  APTT 34  INR 1.14    Sepsis Markers No results for input(s): LATICACIDVEN, PROCALCITON, O2SATVEN in the last 168 hours.  ABG  Recent Labs Lab 04/25/17 0510 04/25/17 0719  PHART 7.387 7.351  PCO2ART 33.8 33.9  PO2ART 146* 70.0*    Liver Enzymes No results for input(s): AST, ALT, ALKPHOS, BILITOT, ALBUMIN in the last 168 hours.  Cardiac Enzymes  Recent Labs Lab 04/24/17 1000  TROPONINI 0.05*    Glucose  Recent Labs Lab 04/22/17 1320 04/24/17 1908 04/24/17 2130 04/25/17 0056 04/25/17 0513 04/25/17 1003  GLUCAP 142* 214* 167* 183* 229* 244*    Imaging Ct Angio Head W Or Wo Contrast  Result Date: 04/25/2017 CLINICAL DATA:  Code stroke.  71 y/o  M; left-sided weakness. EXAM: CT ANGIOGRAPHY HEAD AND NECK CT HEAD CODE STROKE WITHOUT CONTRAST TECHNIQUE: Multidetector CT imaging of the head and neck was performed using the standard protocol during bolus administration of intravenous contrast. Multiplanar CT image reconstructions and MIPs were obtained to evaluate the vascular anatomy. Carotid stenosis measurements (when applicable) are obtained utilizing NASCET criteria, using the  distal internal carotid diameter as the denominator. CONTRAST:  50 cc Isovue 370 COMPARISON:  None. FINDINGS: CT HEAD FINDINGS Brain: Small lucency in the right insula with loss of gray-white differentiation (series 3, image 13). Chronic lacunar infarction within the right superior thalamus. Moderate chronic microvascular ischemic changes of white matter and parenchymal volume loss of the brain. No acute intracranial hemorrhage identified. No hydrocephalus or effacement of basilar cisterns. Vascular: Right M1 hyperdensity. Extensive calcific atherosclerosis of carotid siphons. Skull: Normal. Negative for fracture or focal lesion. Sinuses: Left maxillary sinus mucous retention cyst. Otherwise negative. Orbits: No acute finding. Review of the MIP images  confirms the above findings CTA NECK FINDINGS Aortic arch: 4.2 cm ascending aortic aneurysm. Bovine variant anatomy. Right carotid system: No evidence of dissection, stenosis (50% or greater) or occlusion. The mild calcified plaque of the carotid bifurcation and proximal ICA with mild less than 50% stenosis of proximal ICA. 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 Reconstitution of the left internal carotid artery via external carotid collaterals with poor downstream enhancement of the left internal carotid artery to the terminus indicating slow flow. Vertebral arteries: Right V1 occlusion with reconstitution of the right vertebral artery at the V2 segment. Calcified plaque of left vertebral artery origin with severe stenosis. The left vertebral artery is otherwise normal in caliber through its course in the neck. Skeleton: The cervical degenerative changes greatest at the C4-5 level were disc disease and a small disc bulge results in mild canal stenosis. Other neck: The mediastinal adenopathy in the lower paratracheal and right upper peritracheal stations, probably due to venous congestion from pulmonary edema. Upper chest: Small right pleural effusion. Smooth interlobular septal thickening compatible with interstitial pulmonary edema. Review of the MIP images confirms the above findings CTA HEAD FINDINGS Anterior circulation: Extensive calcified plaque of the right carotid siphon with a short segment of severe distal cavernous and second short segment of paraclinoid severe stenosis. Poor enhancement of the distal left cavernous and ophthalmic segments may be due to high-grade stenosis or poor antegrade flow due to left common carotid artery occlusion. Right mid M1 occlusion with poor downstream right MCA collateralization. Patent bilateral anterior cerebral arteries and left middle cerebral artery without significant stenosis. Posterior circulation: No significant stenosis,  proximal occlusion, aneurysm, or vascular malformation. Mild proximal basilar stenosis. Short segments of mild stenosis in the bilateral P1 segments. Right V4 segment short segment of mild stenosis secondary to calcified plaque. Venous sinuses: As permitted by contrast timing, patent. Anatomic variants: Small anterior communicating artery. No posterior communicating artery identified, likely hypoplastic or absent. 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 ASPECTS = 9 right MCA infarction. No acute hemorrhage. CT head: 1. Small focus of hypoattenuation within right insula compatible with acute infarct, ASPECTS = 9. 2. Moderate chronic microvascular ischemic changes and moderate parenchymal volume loss of the brain. 3. Small chronic lacunar infarct in right superior thalamus. CTA neck: 1. Left common carotid artery occlusion. Reconstitution of left internal carotid artery from external carotid artery collaterals. Poor enhancement of left internal carotid artery indicating slow flow. 2. No significant stenosis of the right carotid system in the neck. 3. Right V1 occlusion with reconstitution of right vertebral artery at the V2 segment. 4. Severe left vertebral artery origin stenosis secondary to calcified plaque. 5. 4.2 cm aortic aneurysm. Recommend annual imaging followup by CTA or  MRA. This recommendation follows 2010 ACCF/AHA/AATS/ACR/ASA/SCA/SCAI/SIR/STS/SVM Guidelines for the Diagnosis and Management of Patients with Thoracic Aortic Disease. Circulation. 2010; 121: Z610-R604. 6. Interstitial pulmonary edema. Enlarged mediastinal lymph nodes is likely related to pulmonary edema. CTA head: 1. Right mid M1 occlusion. Poor downstream right MCA collateralization. 2. Poor enhancement of distal left cavernous and ophthalmic segments of ICA may be due to slow flow secondary to common carotid  occlusion or underlying high-grade stenosis. 3. Intracranial atherosclerosis with multiple areas of mild stenosis in the anterior and posterior circulation. 4. No additional large vessel occlusion, aneurysm, or evidence for vascular malformation. These results were called by telephone at the time of interpretation on 04/25/2017 at 12:55 am to Dr. Ritta Slot , who verbally acknowledged these results. Electronically Signed   By: Mitzi Hansen M.D.   On: 04/25/2017 01:14   Ct Head Wo Contrast  Result Date: 04/25/2017 CLINICAL DATA:  71 y/o M; post transarterial intervention of stroke. EXAM: CT HEAD WITHOUT CONTRAST TECHNIQUE: Contiguous axial images were obtained from the base of the skull through the vertex without intravenous contrast. COMPARISON:  04/25/2017 CT head FINDINGS: Brain: Stable small lucency within the right insula compatible with region of acute infarction. No new area of loss of gray-white differentiation to indicate interval infarction. No acute hemorrhage. Stable background of moderate chronic microvascular ischemic changes and parenchymal volume loss of the brain. No hydrocephalus, extra-axial collection, or effacement of basilar cisterns. Vascular: The persisting contrast opacification of the dural venous sinuses and circle of Willis. Skull: Normal. Negative for fracture or focal lesion. Sinuses/Orbits: The left maxillary sinus mucous retention cyst. Fluid within the nasal and oropharynx is likely due to intubation. Other: None. IMPRESSION: Stable small lucency within the right insula compatible with region of acute infarction. No new area of infarction identified. No acute hemorrhage. Electronically Signed   By: Mitzi Hansen M.D.   On: 04/25/2017 04:44   Ct Angio Neck W Or Wo Contrast  Result Date: 04/25/2017 CLINICAL DATA:  Code stroke.  71 y/o  M; left-sided weakness. EXAM: CT ANGIOGRAPHY HEAD AND NECK CT HEAD CODE STROKE WITHOUT CONTRAST TECHNIQUE:  Multidetector CT imaging of the head and neck was performed using the standard protocol during bolus administration of intravenous contrast. Multiplanar CT image reconstructions and MIPs were obtained to evaluate the vascular anatomy. Carotid stenosis measurements (when applicable) are obtained utilizing NASCET criteria, using the distal internal carotid diameter as the denominator. CONTRAST:  50 cc Isovue 370 COMPARISON:  None. FINDINGS: CT HEAD FINDINGS Brain: Small lucency in the right insula with loss of gray-white differentiation (series 3, image 13). Chronic lacunar infarction within the right superior thalamus. Moderate chronic microvascular ischemic changes of white matter and parenchymal volume loss of the brain. No acute intracranial hemorrhage identified. No hydrocephalus or effacement of basilar cisterns. Vascular: Right M1 hyperdensity. Extensive calcific atherosclerosis of carotid siphons. Skull: Normal. Negative for fracture or focal lesion. Sinuses: Left maxillary sinus mucous retention cyst. Otherwise negative. Orbits: No acute finding. Review of the MIP images confirms the above findings CTA NECK FINDINGS Aortic arch: 4.2 cm ascending aortic aneurysm. Bovine variant anatomy. Right carotid system: No evidence of dissection, stenosis (50% or greater) or occlusion. The mild calcified plaque of the carotid bifurcation and proximal ICA with mild less than 50% stenosis of proximal ICA. 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 Reconstitution of the left internal carotid artery via external carotid collaterals with poor downstream enhancement of the left internal carotid artery  to the terminus indicating slow flow. Vertebral arteries: Right V1 occlusion with reconstitution of the right vertebral artery at the V2 segment. Calcified plaque of left vertebral artery origin with severe stenosis. The left vertebral artery is otherwise normal in caliber through its course  in the neck. Skeleton: The cervical degenerative changes greatest at the C4-5 level were disc disease and a small disc bulge results in mild canal stenosis. Other neck: The mediastinal adenopathy in the lower paratracheal and right upper peritracheal stations, probably due to venous congestion from pulmonary edema. Upper chest: Small right pleural effusion. Smooth interlobular septal thickening compatible with interstitial pulmonary edema. Review of the MIP images confirms the above findings CTA HEAD FINDINGS Anterior circulation: Extensive calcified plaque of the right carotid siphon with a short segment of severe distal cavernous and second short segment of paraclinoid severe stenosis. Poor enhancement of the distal left cavernous and ophthalmic segments may be due to high-grade stenosis or poor antegrade flow due to left common carotid artery occlusion. Right mid M1 occlusion with poor downstream right MCA collateralization. Patent bilateral anterior cerebral arteries and left middle cerebral artery without significant stenosis. Posterior circulation: No significant stenosis, proximal occlusion, aneurysm, or vascular malformation. Mild proximal basilar stenosis. Short segments of mild stenosis in the bilateral P1 segments. Right V4 segment short segment of mild stenosis secondary to calcified plaque. Venous sinuses: As permitted by contrast timing, patent. Anatomic variants: Small anterior communicating artery. No posterior communicating artery identified, likely hypoplastic or absent. 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 ASPECTS = 9 right MCA infarction. No acute hemorrhage. CT head: 1. Small focus of hypoattenuation within right insula compatible with acute infarct, ASPECTS = 9. 2. Moderate chronic microvascular ischemic changes and moderate parenchymal volume loss of the  brain. 3. Small chronic lacunar infarct in right superior thalamus. CTA neck: 1. Left common carotid artery occlusion. Reconstitution of left internal carotid artery from external carotid artery collaterals. Poor enhancement of left internal carotid artery indicating slow flow. 2. No significant stenosis of the right carotid system in the neck. 3. Right V1 occlusion with reconstitution of right vertebral artery at the V2 segment. 4. Severe left vertebral artery origin stenosis secondary to calcified plaque. 5. 4.2 cm aortic aneurysm. Recommend annual imaging followup by CTA or MRA. This recommendation follows 2010 ACCF/AHA/AATS/ACR/ASA/SCA/SCAI/SIR/STS/SVM Guidelines for the Diagnosis and Management of Patients with Thoracic Aortic Disease. Circulation. 2010; 121: [id number]. 6. Interstitial pulmonary edema. Enlarged mediastinal lymph nodes is likely related to pulmonary edema. CTA head: 1. Right mid M1 occlusion. Poor downstream right MCA collateralization. 2. Poor enhancement of distal left cavernous and ophthalmic segments of ICA may be due to slow flow secondary to common carotid occlusion or underlying high-grade stenosis. 3. Intracranial atherosclerosis with multiple areas of mild stenosis in the anterior and posterior circulation. 4. No additional large vessel occlusion, aneurysm, or evidence for vascular malformation. These results were called by telephone at the time of interpretation on 04/25/2017 at 12:55 am to Dr. Ritta Slot , who verbally acknowledged these results. Electronically Signed   By: Mitzi Hansen M.D.   On: 04/25/2017 01:14   Ct Angio Ao+bifem W & Or Wo Contrast  Result Date: 04/25/2017 CLINICAL DATA:  71 year old male with a history of admission for congestive heart failure 24 hours prior to acute inpatient clinical stroke with emergent large vessel occlusion of the right middle cerebral artery. Status  post treatment with both mechanical thrombectomy and systemic, weight  based dose of tPA, the right MCA flow has been restored, however, right foot pulses were discovered to be absent. Diagnostic CT imaging was performed to evaluate the right lower extremity arterial system. EXAM: CT ANGIOGRAPHY OF ABDOMINAL AORTA WITH ILIOFEMORAL RUNOFF TECHNIQUE: Multidetector CT imaging of the abdomen, pelvis and lower extremities was performed using the standard protocol during bolus administration of intravenous contrast. Multiplanar CT image reconstructions and MIPs were obtained to evaluate the vascular anatomy. CONTRAST:  100 cc Isovue 3 7 COMPARISON:  Chest CT 04/18/2017 FINDINGS: VASCULAR Aorta: Atherosclerotic plaque of the abdominal aorta with mixed calcified and soft plaque. No aneurysm or dissection of the visualized lower abdominal aorta. IMA: Inferior mesenteric artery is patent. Right lower extremity: Mild soft and calcified plaque of the right iliac system. Right common iliac artery measures 13 mm. No dissection. Hypogastric artery is patent. Moderate atherosclerotic changes of the right hypogastric system. Right external iliac artery patent with mild atherosclerotic changes. Irregular intimal contour of the right common femoral artery with luminal narrowing, at the site of recent arterial access. Common femoral artery remains patent throughout its course to the bifurcation. Profunda femoris remains patent, as well as the thigh branches. There is proximal occlusion of the superficial femoral artery. Circumferential calcified plaque of the length of the superficial femoral artery with background of atherosclerotic changes extending through the popliteal artery. No contrast is visualized through the proximal 2/3 of the SFA. Collaterals partially fill the distal SFA and above knee popliteal artery. No contrast identified through the length of the tibial vessels. Baseline calcified disease present through the length of the tibial vessels including predominantly anterior tibial artery and  posterior tibial artery. Left lower extremity: Mixed calcified and soft plaque of the left iliac system. Diameter of the left common iliac artery measures 12 mm. No dissection flap. Hypogastric arteries patent with moderate atherosclerotic changes. External iliac artery patent with mild plaque. Common femoral artery patent with mild plaque. Profunda femoris patent as well as the thigh branches. Superficial femoral artery demonstrates mild to moderate calcified and soft plaque with no significant stenosis identified. Popliteal artery appears patent through its length with mild plaque. There is decrease contrast column within the distal popliteal artery, with decrease contrast filling of the tibial vessels. There does appear to be discontinuous contrast flow of the peroneal artery to the ankle, with questionable filling of the anterior tibial and posterior tibial vessels. Baseline calcified disease of the tibial vessels, predominantly anterior tibial artery and posterior tibial artery. Veins: Unremarkable appearance of the venous system. Review of the MIP images confirms the above findings. NON-VASCULAR Urinary tract: Incomplete visualization of the bilateral kidneys. Likely Bosniak 1 cyst at the inferior left kidney. Excreted contrast within the urinary bladder. Stomach/Bowel: Unremarkable appearance of visualized small bowel. Unremarkable appearance of the visualized colon. Lymphatic: Multiple lymph nodes in the para-aortic nodal station, none of which are enlarged. Mesenteric: No adenopathy. Reproductive: Prostate diameter not enlarged. Other: Gas within the anterior abdominal soft tissues, potentially secondary to injections. Compression dressing on the right common femoral region. Musculoskeletal: Degenerative changes of the lower lumbar spine. No displaced fracture. IMPRESSION: Occlusion of the right SFA just after the origin, with patent stump. There is minimal collateral filling of the distal SFA and popliteal  artery via patent profunda femoris. There is also absent contrast flow within the right-sided tibial vasculature to the ankle. On the left, contrast flow is maintained through the popliteal artery, however,  there is decrease contrast within the anterior tibial artery and posterior tibial artery. There appears to be at least partial filling of the peroneal artery to the ankle. This may be related to additional emboli versus baseline vascular disease. Moderate atherosclerotic changes of the bilateral iliac, femoral-popliteal, and tibial vasculature, with calcifications through the length of the right SFA, right popliteal artery, and bilateral tibial arteries. Irregularity of the intimal surface of the right common femoral artery, which is patent with mild narrowing, at the site of recent femoral arterial sheath access. No evidence of hematoma with compression dressing in place. Signed, Yvone Neu. Loreta Ave, DO [facility] Electronically Signed   By: Gilmer Mor D.O.   On: 04/25/2017 05:06   Dg Chest Port 1 View  Result Date: 04/25/2017 CLINICAL DATA:  Endotracheal intubation. EXAM: PORTABLE CHEST 1 VIEW COMPARISON:  04/24/2017 FINDINGS: Postoperative changes in the mediastinum. Endotracheal tube with tip measuring 3.5 cm above the carina. Enteric tube tip is off the field of view but below the left hemidiaphragm. Shallow inspiration. Cardiac enlargement. Mild developing pulmonary vascular congestion. Possible early perihilar edema. No blunting of costophrenic angles. No pneumothorax. IMPRESSION: Appliances appear in satisfactory location. Cardiac enlargement with mild developing pulmonary vascular congestion and possible early perihilar edema. Electronically Signed   By: Burman Nieves M.D.   On: 04/25/2017 05:40   Ir Percutaneous Art Thrombectomy/infusion Intracranial Inc Diag Angio  Result Date: 04/25/2017 INDICATION: 71 year old male admitted with a  history of congestive heart failure. Acute mental status change recognized at approximately mid night, with last known well of 10:30 p.m. with acute code stroke activation. Imaging demonstrates right M1 occlusion corresponding to the patient's dense left-sided symptoms. EXAM: ULTRASOUND GUIDED ACCESS RIGHT COMMON FEMORAL ARTERY CERVICAL AND CEREBRAL ANGIOGRAM MECHANICAL THROMBECTOMY RIGHT MCA OCCLUSION COMPARISON:  CT AND CT ANGIOGRAM IMAGING OF THE SAME DATE MEDICATIONS: None ANESTHESIA/SEDATION: General endotracheal anesthesia CONTRAST:  40 cc Isovue FLUOROSCOPY TIME:  Fluoroscopy Time: 12 minutes 18 seconds (839 mGy). COMPLICATIONS: None TECHNIQUE: Informed written consent was obtained from the patient's family after a thorough discussion of the procedural risks, benefits and alternatives. Specific risks discussed include: Bleeding, infection, contrast reaction, kidney injury/failure, need for further procedure/surgery, arterial injury or dissection, embolization to new territory, intracranial hemorrhage (10-15% risk), neurologic deterioration, cardiopulmonary collapse, death. All questions were addressed. Maximal Sterile Barrier Technique was utilized including during the procedure including caps, mask, sterile gowns, sterile gloves, sterile drape, hand hygiene and skin antiseptic. A timeout was performed prior to the initiation of the procedure. FINDINGS: Initial: Right common carotid artery:  Normal course caliber and contour. Right external carotid artery: Patent with antegrade flow. Right internal carotid artery: Mildly tortuous right cervical ICA with no significant atherosclerotic changes at the carotid bulb. No significant stenosis. Right MCA: Initial angiogram demonstrates proximal occlusion of the right middle cerebral artery. There is an early downgoing temporal branch which remains patent. Initial flow is TICI 0. Moderate collateral flow into the MCA territory from [facility] and transcranial branches. Right  ACA: A 1 segment patent. A 2 segment perfuses the right ACA territory. Cross filling of the patent anterior communicating artery with the right-sided carotid flow perfusing the left anterior cerebral artery territory. There is significant competing arterial flow from the left anterior cerebral artery, with to and fro flow of the left A1 segment, compatible with adequate collateralization of the left MCA system. Late phase of the right common injection demonstrates partial opacification of the left cervical ICA and proximal  MCA. Completion: After single pass of mechanical thrombectomy stent solitaire device, there is restoration of TICI 3 flow of the right MCA territory. Femoral artery angiogram demonstrates patent common femoral artery with appropriate puncture site. Profunda femoris patent with occlusion of the proximal superficial femoral artery. No Doppler signal of the right pedal pulses at the completion of the study. PROCEDURE: The patient is brought to the interventional radiology suite and placed in the supine position on the IR table. The anesthesia team was present for induction of general endotracheal anesthesia. Anesthesia team initiated a left radial arterial line. After patient preparation was performed and a time-out procedure to confirm patient and procedure and consent, ultrasound survey of the right inguinal region was performed with images stored and sent to PACs. 11 blade scalpel was used to make a small incision. Blunt dissection was performed. A micropuncture needle was used access the right common femoral artery under ultrasound. With excellent arterial blood flow returned, an .018 micro wire was passed through the needle, observed to enter the abdominal aorta under fluoroscopy. The needle was removed, and a micropuncture sheath was placed over the wire. The inner dilator and wire were removed, and an 035 Bentson wire was advanced under fluoroscopy into the abdominal aorta. The sheath was  removed and a standard 5 [place] vascular sheath was placed. The dilator was removed and the sheath was flushed. A 92F JB-1 diagnostic catheter was advanced over the wire to the proximal descending thoracic aorta. Wire was then removed. Double flush of the catheter was performed. Catheter was then used to navigate through the innominate artery and into the right common carotid artery. Formal angiogram was performed. Exchange length Rosen wire was then passed through the diagnostic catheter to the distal common carotid artery and into the proximal right internal carotid artery and the diagnostic catheter was removed. The 5 French sheath was removed and exchanged for 8 French 55 centimeter BrightTip sheath. Sheath was flushed and attached to pressurized and heparinized saline bag for constant forward flow. Then an 8 [place], 85 cm Flowgate balloon tip catheter was prepared on the back table with inflation of the balloon with 50/50 concentration of dilute contrast. The balloon catheter was then advanced over the wire, positioned into the internal carotid artery. Copious back flush was performed and the balloon catheter was attached to heparinized and pressurized saline bag for forward flow. Angiogram was performed for road map guide. Microcatheter system was then introduced through the balloon guide catheter, using a synchro soft 014 wire and a Trevo Provue18 catheter. Microcatheter system was advanced into the internal carotid artery, to the level of the occlusion. The micro wire was then carefully advanced through the occluded segment. Microcatheter was then push through the occluded segment and the wire was removed. Blood was then aspirated through the hub of the microcatheter, and a gentle contrast injection was performed confirming intraluminal position. A rotating hemostatic valve was then attached to the back end of the microcatheter, and a pressurized and heparinized saline bag was attached to the catheter. 4 x 40  solitaire device was then selected. Back flush was achieved at the rotating hemostatic valve, and then the device was gently advanced through the microcatheter to the distal end. The retriever was then unsheathed by withdrawing the microcatheter under fluoroscopy. Once the retriever was completely unsheathed, a 3 minute time period was observed. The balloon at the balloon tip catheter was then inflated under fluoroscopy for proximal flow arrest. Constant aspiration was then performed at the  tip of the balloon guide catheter as the retriever was gently and slowly withdrawn with fluoroscopic observation. Once the retriever was entirely removed from the system, free aspiration was confirmed at the hub of the balloon guide catheter, with free blood return confirmed. The balloon was then deflated, rotating hemostatic valve was reattached, and a control angiogram was performed. Control angiogram was performed at the right common femoral artery puncture site. The 55 centimeter 8 French sheath was removed over roadrunner 035 wire. Abbott Proglide suture mediated closure was attempted, with failure of the suture. Manual pressure was applied. Gel-Foam was inserted into the soft tissue tract within the incision. Pressure dressing was applied. Patient tolerated the procedure well and remained hemodynamically stable throughout. No complications were encountered and no significant blood loss encountered. IMPRESSION: Status post ultrasound guided access right common femoral artery with cerebral angiogram and mechanical thrombectomy for treatment of acute ELVO of right M1, with restoration of TICI 3 flow after 1 pass. Completion angiogram at the right common femoral artery puncture demonstrates proximal occlusion of the superficial femoral artery, and there are absent pedal pulses on Doppler examination at the right foot. Signed, Yvone Neu. Loreta Ave DO [facility] Radiology PLAN:  Patient will have a noncontrast head CT performed. Patient will have CT angiogram runoff of the abdomen and lower extremities given the absent pedal pulses. Patient will be admitted to ICU maintained on the ventilator. Sheath has been withdrawn from the right common femoral artery, with failure of suture mediated device. Case has been discussed with vascular surgery. Compression dressing should remain for 6 hours. Electronically Signed   By: Gilmer Mor D.O.   On: 04/25/2017 05:41   Ct Head Code Stroke Wo Contrast  Result Date: 04/25/2017 CLINICAL DATA:  Code stroke.  71 y/o  M; left-sided weakness. EXAM: CT ANGIOGRAPHY HEAD AND NECK CT HEAD CODE STROKE WITHOUT CONTRAST TECHNIQUE: Multidetector CT imaging of the head and neck was performed using the standard protocol during bolus administration of intravenous contrast. Multiplanar CT image reconstructions and MIPs were obtained to evaluate the vascular anatomy. Carotid stenosis measurements (when applicable) are obtained utilizing NASCET criteria, using the distal internal carotid diameter as the denominator. CONTRAST:  50 cc Isovue 370 COMPARISON:  None. FINDINGS: CT HEAD FINDINGS Brain: Small lucency in the right insula with loss of gray-white differentiation (series 3, image 13). Chronic lacunar infarction within the right superior thalamus. Moderate chronic microvascular ischemic changes of white matter and parenchymal volume loss of the brain. No acute intracranial hemorrhage identified. No hydrocephalus or effacement of basilar cisterns. Vascular: Right M1 hyperdensity. Extensive calcific atherosclerosis of carotid siphons. Skull: Normal. Negative for fracture or focal lesion. Sinuses: Left maxillary sinus mucous retention cyst. Otherwise negative. Orbits: No acute finding. Review of the MIP images confirms the above findings CTA NECK FINDINGS Aortic arch: 4.2 cm ascending aortic aneurysm. Bovine variant anatomy. Right carotid system: No evidence of  dissection, stenosis (50% or greater) or occlusion. The mild calcified plaque of the carotid bifurcation and proximal ICA with mild less than 50% stenosis of proximal ICA. 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 Reconstitution of the left internal carotid artery via external carotid collaterals with poor downstream enhancement of the left internal carotid artery to the terminus indicating slow flow. Vertebral arteries: Right V1 occlusion with reconstitution of the right vertebral artery at the V2 segment. Calcified plaque of left vertebral artery origin with severe stenosis. The left vertebral artery is  otherwise normal in caliber through its course in the neck. Skeleton: The cervical degenerative changes greatest at the C4-5 level were disc disease and a small disc bulge results in mild canal stenosis. Other neck: The mediastinal adenopathy in the lower paratracheal and right upper peritracheal stations, probably due to venous congestion from pulmonary edema. Upper chest: Small right pleural effusion. Smooth interlobular septal thickening compatible with interstitial pulmonary edema. Review of the MIP images confirms the above findings CTA HEAD FINDINGS Anterior circulation: Extensive calcified plaque of the right carotid siphon with a short segment of severe distal cavernous and second short segment of paraclinoid severe stenosis. Poor enhancement of the distal left cavernous and ophthalmic segments may be due to high-grade stenosis or poor antegrade flow due to left common carotid artery occlusion. Right mid M1 occlusion with poor downstream right MCA collateralization. Patent bilateral anterior cerebral arteries and left middle cerebral artery without significant stenosis. Posterior circulation: No significant stenosis, proximal occlusion, aneurysm, or vascular malformation. Mild proximal basilar stenosis. Short segments of mild stenosis in the bilateral P1 segments.  Right V4 segment short segment of mild stenosis secondary to calcified plaque. Venous sinuses: As permitted by contrast timing, patent. Anatomic variants: Small anterior communicating artery. No posterior communicating artery identified, likely hypoplastic or absent. 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 ASPECTS = 9 right MCA infarction. No acute hemorrhage. CT head: 1. Small focus of hypoattenuation within right insula compatible with acute infarct, ASPECTS = 9. 2. Moderate chronic microvascular ischemic changes and moderate parenchymal volume loss of the brain. 3. Small chronic lacunar infarct in right superior thalamus. CTA neck: 1. Left common carotid artery occlusion. Reconstitution of left internal carotid artery from external carotid artery collaterals. Poor enhancement of left internal carotid artery indicating slow flow. 2. No significant stenosis of the right carotid system in the neck. 3. Right V1 occlusion with reconstitution of right vertebral artery at the V2 segment. 4. Severe left vertebral artery origin stenosis secondary to calcified plaque. 5. 4.2 cm aortic aneurysm. Recommend annual imaging followup by CTA or MRA. This recommendation follows 2010 ACCF/AHA/AATS/ACR/ASA/SCA/SCAI/SIR/STS/SVM Guidelines for the Diagnosis and Management of Patients with Thoracic Aortic Disease. Circulation. 2010; 121: [id number]. 6. Interstitial pulmonary edema. Enlarged mediastinal lymph nodes is likely related to pulmonary edema. CTA head: 1. Right mid M1 occlusion. Poor downstream right MCA collateralization. 2. Poor enhancement of distal left cavernous and ophthalmic segments of ICA may be due to slow flow secondary to common carotid occlusion or underlying high-grade stenosis. 3. Intracranial atherosclerosis with multiple areas of mild stenosis in the anterior and posterior  circulation. 4. No additional large vessel occlusion, aneurysm, or evidence for vascular malformation. These results were called by telephone at the time of interpretation on 04/25/2017 at 12:55 am to Dr. Ritta SlotMCNEILL KIRKPATRICK , who verbally acknowledged these results. Electronically Signed   By: Mitzi HansenLance  Furusawa-Stratton M.D.   On: 04/25/2017 01:14   STUDIES:  9/7 Head CT >> ASPECTS 9 9/7 CTA head/neck >> Right M1 occlusion, left CCA occlusion 9/7 cerebral angiogram and thrombectomy >> Right M1 occlusion 9/7 head CT/ CTA runoff right leg >> occlusion of right SFA, no evidence of hematoma in right groin  CULTURES: None  ANTIBIOTICS: None  SIGNIFICANT EVENTS: 9/6 Admit 9/7 Code stroke 0010, TPA/ IR  LINES/TUBES: 9/7 ETT >> 9/7 left radial art line >> 9/7 right femoral sheath >> 9/7 (0255)  DISCUSSION:  70 y/oM admitted with acute on chronic systolic HF on 9/6.  Code stroke on 9/7 with left sided weakness and facial drooping, s/p TPA taken to IR for successful embolectomy that developed cooler right leg and absent pulses.    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 P:   Keep foley in place per urology  GASTROINTESTINAL A:   Nutrition. P:   NPO  HEMATOLOGIC A:   No acute issues. P:  F/u CBC  INFECTIOUS A:   No concerns for infection. P:   Monitor clinically  ENDOCRINE A:   DM type II. P:   SSI  NEUROLOGIC A:   Acute R MCA CVA s/p TPA and Right M1 occlusion thrombectomy in IR. P:   RASS goal: 0/-1 Propofol gtt and fentanyl prn  Management and further imaging per Neuro/ Neuro IR   Updated family at bedside  CC time 33 minutes  D/w Dr. Pearlean Brownie  Lateral  transfer from 44m to 4n  Coralyn Helling, MD [facility] Pulmonary/Critical Care 04/25/2017, 10:56 AM Pager:  [phone] After 3pm call: [phone]

## 2017-04-25 NOTE — Consult Note (Addendum)
Neurology Consultation Reason for Consult: left sided weaknes Referring Physician: Code stroke  CC: Left sided weakness  History is obtained from:daughter  HPI: [name] is a 71 y.o. male who was last known to be normal at 10:30 when he was given SQ heparin. Around midnight his daughter noted that he was having some difficulty and notified the nurse and then a code stroke was called.    LKW: 10:30pm tpa given?: yes, delay due to waiting on PTT.  MRS: 1   ROS: A 14 point ROS was performed and is negative except as noted in the HPI.   Past Medical History:  Diagnosis Date  . CAD (coronary artery disease) 2007   CABG 2007 in [place]  . CHF (congestive heart failure) ([name])   . Diabetes mellitus without complication ([name])   . Hyperlipidemia   . Hypertension   . Pulmonary embolism ([name])   . Stroke [facility]([name])      Family History  Problem Relation Age of Onset  . Hypertension Mother   . Hypertension Sister      Social History:  reports that he has never smoked. He has never used smokeless tobacco. He reports that he does not drink alcohol or use drugs.   Exam: Current vital signs: BP 116/78 (BP Location: Left Arm)   Pulse 97   Temp (!) 97.5 F (36.4 C) (Oral)   Resp 18   Ht 6' (1.829 m)   Wt 85 kg (187 lb 8 oz)   SpO2 99%   BMI 25.43 kg/m  Vital signs in last 24 hours: Temp:  [97.5 F (36.4 C)-98.1 F (36.7 C)] 97.5 F (36.4 C) (09/07 0012) Pulse Rate:  [76-97] 97 (09/07 0012) Resp:  [11-26] 18 (09/07 0012) BP: (108-129)/(66-91) 116/78 (09/07 0012) SpO2:  [96 %-100 %] 99 % (09/07 0012) Weight:  [85 kg (187 lb 8 oz)] 85 kg (187 lb 8 oz) (09/06 1818)   Physical Exam  Constitutional: Appears well-developed and well-nourished.  Psych: Affect appropriate to situation Eyes: No scleral injection HENT: No OP obstrucion Head: Normocephalic.  Cardiovascular: Normal rate and regular rhythm.  Respiratory: Effort normal and breath sounds normal to anterior  ascultation GI: Soft.  No distension. There is no tenderness.  Skin: WDI  Neuro: Mental Status: Patient is awake, alert, oriented to person, place, month,  No signs of aphasia, language barrier with daughter interpreting.  Severe left neglect, anosagnosia.  Cranial Nerves: II: Visual Fields are full. Pupils are equal, round, and reactive to light.   III,IV, VI: EOMI without ptosis or diploplia.  V: Facial sensation is decrease don left VII: Facial movement is decreased on left VIII: hearing is intact to voice X: Uvula elevates symmetrically XI: Shoulder shrug is symmetric. XII: tongue is midline without atrophy or fasciculations.  Motor: Tone is normal. Bulk is normal. 5/5 strength was present  On the right, he has a severe left hemiplegia Sensory: Sensation is diminished on the left.  Cerebellar: Does not perform  I have reviewed labs in epic and the results pertinent to this consultation are: hyponatremia  I have reviewed the images obtained: CT head - aspect 9 CTA - R mca occlusion  Impression: 71 yo M With embolic R MCA occlusion, likely due to low EF. He has been given IV tPA and currently is being taken for IR. He appears to have poor collaterals on CTA.   Recommendations: 1. HgbA1c, fasting lipid panel 2. MRI  of the brain without contrast 3. Frequent neuro checks  4. Echocardiogram 5. Carotid dopplers 6. Prophylactic therapy-none for 24 hours.  7. Risk factor modification 8. Telemetry monitoring 9. PT consult, OT consult, Speech consult 10. please page stroke NP  Or  PA  Or MD  from 8am -4 pm as this patient will be followed by the stroke team at this point.   You can look them up on [email or web address]    This patient is critically ill and at significant risk of neurological worsening, death and care requires constant monitoring of vital signs, hemodynamics,respiratory and cardiac monitoring, neurological assessment, discussion with family, other specialists and  medical decision making of high complexity. I spent 70 minutes of neurocritical care time  in the care of  this patient.  Ritta Slot, MD [facility] [phone]  If 7pm- 7am, please page neurology on call as listed in AMION. 04/25/2017  2:00 AM

## 2017-04-25 NOTE — Progress Notes (Signed)
  Day of Surgery Note    Subjective:  intubated   Vitals:   04/25/17 0545 04/25/17 0935  BP:    Pulse: 66   Resp: (!) 30   Temp:    SpO2: 100% 97%    Incisions:   Bandage is dry; moderate amount of bloody/serosanginous drainage.   Extremities:  Faint left peroneal doppler signal    Assessment/Plan:  This is a 71 y.o. male who is s/p  1. Right femoropopliteal thromboembolectomy 2. Endarterectomy right below-the-knee popliteal artery with bovine patch angioplasty 3. Proximal endarterectomy of right tibioperoneal trunk and peroneal artery  -pt with faint peroneal doppler signal that is difficult to find.  Pt with sub acute thrombus in the SFA and BK popliteal artery.  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  There was minimal back bleeding from the AT after endarterectomy.    -pt with ST depression in the OR-labs, EKG and troponin orderred as well as limited echo to check for mural thrombus.  Discussed with Dr. Anne FuSkains.   -Okay to start heparin 6 hours from surgery, but if it is critical to start prior to that, may do so.  Pt will be at increased risk of bleeding from wound.  He does have a JP drain in place. -difficulty foley placement - urology performed Cystoscopy, balloon and Goodwin sound dilation of dense bladder neck contracture, placement of urethral catheter, difficult.     Doreatha MassedSamantha Rhyne, PA-C 04/25/2017 10:47 AM [phone]

## 2017-04-25 NOTE — Consult Note (Signed)
Urology Consult  Consulting MD: Adele Barthel, M.D.  CC: Difficult catheter placement  HPI: This is a 71 year old male from [place] who has been under anesthesia for a revascularization procedure by Dr. Bridgett Larsson.  Prior to the procedure, catheter placement was difficult.  Following the procedure, I was consulted for placement of the catheter.  Below, there is a history of prostate surgery about 16 years ago.  The type of surgery is unknown.  However, he does have a small incision in the infrapubic area which may represent an open prostatectomy scar.  PMH: Past Medical History:  Diagnosis Date  . CAD (coronary artery disease) 2007   CABG 2007 in [place]  . CHF (congestive heart failure) ([place])   . Diabetes mellitus without complication ([place])   . Hyperlipidemia   . Hypertension   . Pulmonary embolism ([place])   . Stroke [facility])     PSH: Past Surgical History:  Procedure Laterality Date  . CARDIAC SURGERY    . IR PERCUTANEOUS ART THROMBECTOMY/INFUSION INTRACRANIAL INC DIAG ANGIO  04/25/2017  . PROSTATE SURGERY  2002    Allergies: No Known Allergies  Medications: Prescriptions Prior to Admission  Medication Sig Dispense Refill Last Dose  . acetaminophen (TYLENOL) 650 MG CR tablet Take 650 mg by mouth at bedtime. FOR LEG PAIN   Past Week at Unknown time  . aspirin EC 325 MG tablet Take 1 tablet (325 mg total) by mouth daily. 30 tablet 11 04/23/2017 at 0800  . atorvastatin (LIPITOR) 80 MG tablet Take 1 tablet (80 mg total) by mouth daily at 6 PM. 30 tablet 11 04/23/2017 at pm  . carvedilol (COREG) 3.125 MG tablet Take 1 tablet (3.125 mg total) by mouth 2 (two) times daily with a meal. 60 tablet 11 04/23/2017 at 2000  . feeding supplement, GLUCERNA SHAKE, (GLUCERNA SHAKE) LIQD Take 237 mLs by mouth 2 (two) times daily between meals.   Past Month at Unknown time  . furosemide (LASIX) 20 MG tablet Take 1 tablet (20 mg total) by mouth daily. 30 tablet 6 04/23/2017 at am  . insulin glargine (LANTUS) 100 UNIT/ML  injection Inject 0.05 mLs (5 Units total) into the skin at bedtime. 10 mL 11 04/23/2017 at 2100  . losartan (COZAAR) 25 MG tablet Take 1 tablet (25 mg total) by mouth daily. 30 tablet 11 04/23/2017 at am  . meloxicam (MOBIC) 7.5 MG tablet Take 2 tablets (15 mg total) by mouth daily. (Patient taking differently: Take 7.5 mg by mouth 2 (two) times daily. ) 30 tablet 0 04/23/2017 at am  . metFORMIN (GLUCOPHAGE) 1000 MG tablet Take 1 tablet (1,000 mg total) by mouth daily with breakfast. 30 tablet 11 04/23/2017 at am  . Blood Glucose Monitoring Suppl (TRUE METRIX METER) DEVI 1 kit by Does not apply route 4 (four) times daily. 1 Device 0 Unk at Unk  . glucose blood (TRUE METRIX BLOOD GLUCOSE TEST) test strip Use as instructed 100 each 12 Unk at Unk  . TRUEPLUS LANCETS 28G MISC 28 g by Does not apply route 4 (four) times daily. 120 each 11 Unk at [employer]     Social History: Social History   Social History  . Marital status: Married    Spouse name: N/A  . Number of children: N/A  . Years of education: N/A   Occupational History  . Not on file.   Social History Main Topics  . Smoking status: Never Smoker  . Smokeless tobacco: Never Used  . Alcohol use No  .  Drug use: No  . Sexual activity: Not on file   Other Topics Concern  . Not on file   Social History Narrative   Pt is from [place] and speaks [place]. He came to the [place] in 02/2017.   He has a daughter and son-in-law that speak [place].     Family History: Family History  Problem Relation Age of Onset  . Hypertension Mother   . Hypertension Sister     Review of Systems: Unable to be obtained  Physical Exam: _0 @  Abdomen: Lower midline incision present. Skin:  Normal turgor.  No visible rash. Extremity: No gross deformity of bilateral upper extremities.  No gross deformity of bilateral lower extremities.  Penis:  circumcised.  No lesions. Urethra: Orthotopic meatus. Scrotum: No lesions.  No ecchymosis.  No  erythema. Testicles: Descended bilaterally.  No masses bilaterally.   Studies:  Recent Labs     04/22/17  1321  04/24/17  1000  HGB  13.1  13.1  WBC  7.9  6.9  PLT  267  248    Recent Labs     04/22/17  1321  04/24/17  1000  NA  129*  126*  K  4.7  4.8  CL  99*  96*  CO2  20*  21*  BUN  19  19  CREATININE  1.11  1.05  CALCIUM  9.3  9.0  GFRNONAA  >60  >60  GFRAA  >60  >60     Recent Labs     04/25/17  0100  INR  1.14  APTT  34     Invalid input(s): ABG    Assessment:  Dense bladder neck contracture, catheter placed.  Following dilation  Plan: 1.  Leave catheter.  As long as patient is in acute care setting, removing your leisure.  2.  The patient does need follow-up in our office following discharge.    Pager:[phone]

## 2017-04-25 NOTE — Op Note (Signed)
OPERATIVE NOTE   PROCEDURE: 1. Right femoropopliteal thromboembolectomy 2. Right below-the-knee popliteal artery endarterectomy with bovine patch angioplasty 3. Proximal endarterectomy of right tibioperoneal trunk and peroneal artery  PRE-OPERATIVE DIAGNOSIS: possible embolism to right superficial femoral artery and popliteal artery   POST-OPERATIVE DIAGNOSIS: same as above   SURGEON: Leonides SakeBrian Ramesha Poster, MD  ASSISTANT(S): Doreatha MassedSamantha Rhyne, PAC   ANESTHESIA: general  ESTIMATED BLOOD LOSS: 100 cc  FINDING(S): 1.  Sub-acute thrombus in superficial femoral artery and below-the-knee popliteal artery  2.  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  Minimal backbleeding from tibioperoneal trunk after endarterectomy 5.  No backbleeding from anterior tibial artery after endarterectomy 6.  Palpable below-the-knee pulse after endarterectomy and bovine patch angioplasty 7.  Dampened monophasic peroneal artery signal  SPECIMEN(S):  Thrombus from right femoropopliteal artery  INDICATIONS:   [name] is a 71 y.o. male admitted with acute on chronic heart failure who presents with right leg ischemia after a mechanical thrombectomy of right middle cerebral artery.  On CTA abd/pelvis with bilateral leg runoff, it appeared as if the right superficial femoral artery and popliteal artery had acute thrombus.  I recommended emergent thrombectomy of the right leg to the patient's daughter as the patient was intubated and sedated.  The risk, benefits, and alternative for thrombectomy were discussed with the patient's family member.  The patient's family is aware the risks include but are not limited to: bleeding, infection, myocardial infarction, stroke, limb loss, nerve damage, limb edema, need for additional procedures in the future, wound complications, and  inability to completely eliminate embolic debris in the right leg.    DESCRIPTION: After obtaining full informed written consent, the patient was brought back to the operating room and placed supine upon the operating table.  The patient received IV antibiotics prior to induction.  A procedure time out was completed and the correct surgical site was verified.  After obtaining adequate anesthesia, the patient was prepped and draped in the standard fashion for: right leg thrombectomy.  I turned my attention to the right calf.  I externally rotated the right knee and placed a bump to facilitate the popliteal exposure.  I made an incision one finger-width behind the tibia.  I dissected through the underlying tissue with electrocautery until I reached the fascia.  This was opened with electrocautery and then the medial gastrocnemius head was retracted posteriorly.  I dissected out the tibial nerve and the medial popliteal vein which was the dominant vein.  I placed a vessel loop and retracted the vein posteriorly and medially.  This facilitated the dissection of the below-the-knee popliteal artery from the bifurcation into tibioperoneal trunk and anterior tibial artery.  I carried the dissect proximally until I found a softer area of the popliteal artery.  This artery and the proximal tibioperoneal trunk and anterior tibial artery were diffusely diseased. I placed vessel loops around these arteries.    The patient was given 8500 units of Heparin intravenously, which was a therapeutic bolus.  I made an arteriotomy with a 15 blade and carried the incision into the lumen of the artery.  There was NO bleeding.  I extended this proximal and distally down to the bifurcation.  Severe atherosclerotic plaque was evident throughout this segment.  It became evident that there was plaque in the proximal below-the-knee popliteal artery that was causing a distal popliteal occlusion.  Proximal to this plaque, I found some  sub-acute thrombus.    I passed sequential 3 and 4 Fogarty balloons proximally until I extracted all the thrombus from the superficial femoral artery.  There was return of pulsatile bleeding the below-the-knee popliteal artery.  I clamped this artery proximally.  I then turned my attention distally.  There was no flow lumen distally despite probing with a 2 mm probe.  I completed an endarterectomy of the dominant plaque in this below-the-knee popliteal, carrying dissection into the tibioperoneal trunk and anterior tibial artery.  The plaque extending in the proximal anterior tibial artery appeared to be chronic more extensive than purely orifical.  There was no return of any backbleeding suggesting this is chronically occluded.  I then carried the dissection in the proximal tibioperoneal trunk.  This also appeared to be chronic and extending distally.  I was able to get this plaque to partial feather.  I did get return of backbleeding.  At this time in the case, the Anesthesia team noted that there with acute ST segment changes on the cardiac monitoring concerning for a new myocardial infarction.  I subsequently elected to finish the endarterectomy of the below-the-knee popliteal artery and patch the artery quickly, rather than exploring the tibioperoneal trunk down to the posterior tibial artery.  I turned my attention back to the popliteal artery.  There were some densely adherent plaque in the popliteal artery, that could not be extracted without tearing the wall.  I elected to patch this below-the-knee popliteal artery that was severely diseased with a bovine pericardial patch.  I fashion the patch for the geometry of this artery.  I sewed the patch onto the artery with a running stitch of 6-0 Prolene.  Prior to completion, I backbled the tibioperoneal trunk and there was some limited backbleeding and allowed the popliteal artery to bleed antegrade.  There was frank clot present.  I finished this bovine  patch angioplasty in the usual fashion.  I removed all vessel loops and clamps.  Immediately there was a palpable pulse in the below-the-knee popliteal artery.  There some bleeding from the patch suture line, so I tightened up the suture line on the bovine patch and repaired it in the usual fashion.  I washed out the popliteal fossa and packed it with Avitene.  After a few minutes, I washed out the Avitene.  There was no further active bleeding.  There a palpable below-the-knee popliteal artery but only a monophasic tibioperoneal trunk signal.  Distally I could only find a dampened monophasic peroneal signal.  I suspected this was due to the coolness of his foot, having been uncovered in IR also.  While there was no further bleed, this patient was likely to be placed on anticoagulation in the event of myocardial infarction, so I felt placement of a drain was indicated.  I passed the trocar of 15 Blake drain through the skin just distal to the incision in the calf.  I sharply shortened the length of the Blake drain and placed it adjacent to the artery.  I reapproximated the subcutaneous tissue with a running stitch of 3-0 Vicryl.  I then reapproximated the skin in the calf with staples given the possibility of bleeding in this popliteal space in the event anticoagulation is needed.  This patient may need additional angiography to determine any additional revascularization options in the right leg, but currently his cardiac status is too unstable to proceed with such.   COMPLICATIONS: possible myocardial infarction  CONDITION: critical   Leonides Sake, MD, FACS [facility] Office: [phone] Pager: [phone]  04/25/2017, 8:12 AM

## 2017-04-25 NOTE — Sedation Documentation (Signed)
Pt in CT for CT head and R leg run off.

## 2017-04-25 NOTE — OR Nursing (Signed)
Urology consult placed .

## 2017-04-25 NOTE — Anesthesia Procedure Notes (Signed)
Arterial Line Insertion Start/End9/02/2017 1:40 AM, 04/25/2017 1:48 AM Performed by: GREEN, CHARLENE, MCMILLEN, MICHAEL L, CRNA  Patient location: OR. Preanesthetic checklist: patient identified, IV checked, monitors and equipment checked and pre-op evaluation Patient sedated Left, radial was placed Catheter size: 20 G Hand hygiene performed  and maximum sterile barriers used   Attempts: 1 Procedure performed without using ultrasound guided technique. Ultrasound Notes:anatomy identified Following insertion, dressing applied.

## 2017-04-25 NOTE — Sedation Documentation (Signed)
Report called to accepting nurse 2M7, Mertha BaarsFelcia, RN.

## 2017-04-25 NOTE — Code Documentation (Addendum)
Called by RNs to assess patient, per RN that called me, patient "different" and "acting weird".  I arrived at 1215, patient was in the bed and flaccid on the left side (LUE and LLE), + right facial droop, I immediately paged Neuro MD on call and initiated a code stroke. LSN at 2230.  Code stroke paged at 0024 via [facility].   Dr. Amada JupiterKirkpatrick came to bedside immediately, patient taken down to CT.  CT HEAD - for bleed, CTA + Right MCA M1 occlusion.  Patient was given sub-cutaneous heparin this admission, stat PTT and PT/INR ordered, labs resulted, attempted foley placement x 2, patient has an enlarged prostate.  NIH 15. Pupils were 3, round, brisk, reactive, bilaterally.   SBP ranged from 100-115, MAP 80-85s.  IR team paged at 0104.  While waiting for IR team arrival, patient did become short of breath, SpO2 was in the mid 80s, HR 70-80s, blood sugar 183. I applied 6L nasal cannula, saturations improved but shortness of breath was present still, patient placed on NRB 15L, shortness of breath improved.   Anesthesia arrived, patient taken into IR suite, TPA started on 0138, verified by RPh.  Two more attempts of foley placement were done by IR RN, myself, and CRNA.   Side Note: I was called back to assist with patient in IR, patient did have post-procedure bleeding after sheath was removed, absent pulses in the right leg, patient taken for STAT CT HEAD and CTA leg to check for run-off.  After scans, patient transferred to 2M07.    Start Time 0010 End Time 0500

## 2017-04-25 NOTE — Anesthesia Postprocedure Evaluation (Signed)
Anesthesia Post Note  Patient: Nurse, [name]  Procedure(s) Performed: Procedure(s) (LRB): RADIOLOGY WITH ANESTHESIA Code Stroke (N/A)     Patient location during evaluation: ICU Anesthesia Type: General Level of consciousness: patient remains intubated per anesthesia plan Pain management: pain level controlled Vital Signs Assessment: post-procedure vital signs reviewed and stable Respiratory status: patient remains intubated per anesthesia plan Cardiovascular status: stable Anesthetic complications: no    Last Vitals:  Vitals:   04/24/17 2134 04/25/17 0012  BP: 108/76 116/78  Pulse: 80 97  Resp: 20 18  Temp: 36.4 C (!) 36.4 C  SpO2: 98% 99%    Last Pain:  Vitals:   04/25/17 0012  TempSrc: Oral  PainSc:                  [name]

## 2017-04-25 NOTE — Anesthesia Preprocedure Evaluation (Addendum)
Anesthesia Evaluation  Patient identified by MRN, date of birth, ID band Patient confused    Reviewed: Allergy & Precautions, NPO status , Patient's Chart, lab work & pertinent test resultsPreop documentation limited or incomplete due to emergent nature of procedure.  Airway Mallampati: II  TM Distance: >3 FB     Dental   Pulmonary    breath sounds clear to auscultation       Cardiovascular hypertension, + CAD and +CHF   Rhythm:Regular Rate:Normal     Neuro/Psych    GI/Hepatic negative GI ROS, Neg liver ROS,   Endo/Other  diabetes  Renal/GU negative Renal ROS     Musculoskeletal   Abdominal   Peds  Hematology   Anesthesia Other Findings   Reproductive/Obstetrics                            Anesthesia Physical Anesthesia Plan  ASA: IV  Anesthesia Plan: General   Post-op Pain Management:    Induction: Intravenous  PONV Risk Score and Plan: 2 and Ondansetron and Dexamethasone  Airway Management Planned: Oral ETT  Additional Equipment:   Intra-op Plan:   Post-operative Plan: Post-operative intubation/ventilation  Informed Consent:   Plan Discussed with: CRNA and Anesthesiologist  Anesthesia Plan Comments:        Anesthesia Quick Evaluation

## 2017-04-25 NOTE — Progress Notes (Signed)
Pt. Found at 1210 am exhibiting facial drooping, left sided weakness, drooling by NT. RN informed and Rapid response RN paged. Neurologist now at pts. Bedside. Stat CT ordered. Code stroke activated. On call for Cardiology paged to make aware.

## 2017-04-25 NOTE — Progress Notes (Signed)
Results for [name], Kamron (MRN [id number]) as of 04/25/2017 15:33  Ref. Range 04/25/2017 00:56 04/25/2017 05:13 04/25/2017 10:03 04/25/2017 12:08  Glucose-Capillary Latest Ref Range: 65 - 99 mg/dL 403183 (H) 474229 (H) 259244 (H) 238 (H)  Received diabetes coordinator consult. Noted that patient is on ventilator. Recommend having patient on ICU hyperglycemia protocol while NPO. Will continue to monitor blood sugars while in the hospital.  Will talk with patient at appropriate time.   Smith MinceKendra Skyley Grandmaison RN BSN CDE Diabetes Coordinator Pager: [phone]  8am-5pm

## 2017-04-25 NOTE — Anesthesia Procedure Notes (Signed)
Procedure Name: Intubation Date/Time: 04/25/2017 1:41 AM Performed by: Manuela Schwartz B Pre-anesthesia Checklist: Patient identified, Emergency Drugs available, Suction available and Patient being monitored Patient Re-evaluated:Patient Re-evaluated prior to induction Oxygen Delivery Method: Circle System Utilized Preoxygenation: Pre-oxygenation with 100% oxygen Induction Type: IV induction, Rapid sequence and Cricoid Pressure applied Laryngoscope Size: Mac and 3 Grade View: Grade I Tube type: Subglottic suction tube Tube size: 7.5 mm Number of attempts: 1 Airway Equipment and Method: Stylet and Oral airway Placement Confirmation: ETT inserted through vocal cords under direct vision,  positive ETCO2 and breath sounds checked- equal and bilateral Secured at: 22 cm Tube secured with: Tape Dental Injury: Teeth and Oropharynx as per pre-operative assessment

## 2017-04-25 NOTE — Sedation Documentation (Signed)
R Groin Perclose failure. Manual pressure being applied by Dr. Loreta AveWagner.

## 2017-04-25 NOTE — Sedation Documentation (Signed)
R groin level 0. V Pad and pressure bandage applied. Unable doppler R DP or PT. R leg slightly cooler to touch than left. Dr. Loreta AveWagner aware.

## 2017-04-25 NOTE — Care Management Note (Signed)
Case Management Note  Patient Details  Name: Gardiner Rhymessa Lina MRN: [id number] Date of Birth: [birth year]  Subjective/Objective:     S/p Right femoropopliteal thromboembolectomy Endarterectomy right below-the-knee popliteal artery with bovine patch angioplasty Proximal endarterectomy of right tibioperoneal trunk and peroneal artery               Action/Plan:  PTA independent from home.  Pt is ventilated with daughter at bedside.  Pt is active with [facility] and gets medications from there - attending would need to ensure at discharge pt is on the least expensive but effective drug.  Due to pt not being a [place] Citizen - assistance programs can not be utilized.  CM will continue to follow for discharge needs   Expected Discharge Date:                  Expected Discharge Plan:  Home/Self Care  In-House Referral:     Discharge planning Services  CM Consult  Post Acute Care Choice:    Choice offered to:     DME Arranged:    DME Agency:     HH Arranged:    HH Agency:     Status of Service:     If discussed at [employer] Length of Stay Meetings, dates discussed:    Additional Comments:  Cherylann ParrClaxton, Quentin Shorey S, RN 04/25/2017, 4:25 PM

## 2017-04-25 NOTE — Transfer of Care (Signed)
Immediate Anesthesia Transfer of Care Note  Patient: [name]  Procedure(s) Performed: Procedure(s): EMBOLECTOMY RIGHT FEMORAL ARTERY (Right) PATCH ANGIOPLASTY USING XENOSURE BIOLOGIC PATCH (Right) CYSTOSCOPY FLEXIBLE (N/A)  Patient Location: ICU  Anesthesia Type:General  Level of Consciousness: unresponsive and Patient remains intubated per anesthesia plan  Airway & Oxygen Therapy: Patient remains intubated per anesthesia plan and Patient placed on Ventilator (see vital sign flow sheet for setting)  Post-op Assessment: Report given to RN and Post -op Vital signs reviewed and stable  Post vital signs: Reviewed and stable  Last Vitals:  Vitals:   04/25/17 0530 04/25/17 0545  BP:    Pulse: 75 66  Resp: 16 (!) 30  Temp: 36.6 C   SpO2: 100% 100%    Last Pain:  Vitals:   04/25/17 0530  TempSrc: Axillary  PainSc:          Complications: No apparent anesthesia complications

## 2017-04-25 NOTE — Anesthesia Postprocedure Evaluation (Signed)
Anesthesia Post Note  Patient: [name]  Procedure(s) Performed: Procedure(s) (LRB): EMBOLECTOMY RIGHT FEMORAL ARTERY (Right) PATCH ANGIOPLASTY USING XENOSURE BIOLOGIC PATCH (Right) CYSTOSCOPY FLEXIBLE (N/A)     Patient location during evaluation: ICU Anesthesia Type: General Level of consciousness: patient remains intubated per anesthesia plan Pain management: pain level controlled Vital Signs Assessment: post-procedure vital signs reviewed and stable Respiratory status: patient remains intubated per anesthesia plan Cardiovascular status: stable Anesthetic complications: no    Last Vitals:  Vitals:   04/25/17 1100 04/25/17 1138  BP: 102/87 102/87  Pulse: 73 70  Resp: 19 20  Temp:    SpO2: 100% 100%    Last Pain:  Vitals:   04/25/17 0530  TempSrc: Axillary  PainSc:                  Porsche Noguchi

## 2017-04-25 NOTE — Op Note (Signed)
Preoperative diagnosis: Difficult catheter placement  Postoperative diagnosis: Bladder neck contracture, dense with urinary retention   Procedure: Cystoscopy, balloon and Goodwin sound dilation of dense bladder neck contracture, placement of urethral catheter, difficult    Surgeon: Bertram MillardStephen M. Jamian Andujo, M.D.   Anesthesia: Gen.   Complications: None  Specimen(s): None  Drain(s): 14 French Foley/council tip catheter  Indications: 71 year old male from [name] who for the past few hours has had treatment of embolic/occlusive arterial disease.  Catheter placement was attempted prior to the operative procedure, and was difficult/impossible.  Urologic consultation is requested.  I'm unaware of prior urologic history in this gentleman.  He is under anesthetic and unable to answer questioning.    Technique and findings: The patient's penis was prepped and draped.  Proper timeout performed.  I attempted to negotiate an 4318 French coud-tip catheter without success.  I then set up the flexible cystoscopy equipment.  Cystoscopy revealed a normal urethra, but a significant bladder neck contracture.  I was unable to guide the tip of the scope into the bladder.  I then passed, over top of a guidewire that was then placed into the bladder, a NephroMax 28 French balloon dilator.  I dilated the urethra and bladder neck, sequentially to 20 atmospheres of pressure.  The NephroMax balloon was removed, and I tried passing a 16 [place] council tip catheter over top of the guidewire.  This was unsuccessful.  I balloon dilated the bladder neck contracture.  Several other times, and I felt that it was fairly difficult getting this balloon past the bladder neck contracture.  The balloon dilator was then removed.  Using Beazer Homesoodwin sounds, the contracture was dilated to 30 [place].  These actually passed fairly easily, but I was still unable to guide a catheter past the bladder neck contracture.  Eventually, I was able to pass a 14  [place] Foley catheter with the tip punctured, serving as a council tip catheter.  This was then passed into the bladder.  The balloon was filled with 10 mL of water.  The catheter was hooked to a bedside bag.  This part of the procedure was tolerated well by the patient.  There was a mild amount of bleeding from the dilation, but not significant, in my opinion.  The patient was then transferred to the PACU.

## 2017-04-25 NOTE — Anesthesia Preprocedure Evaluation (Addendum)
Anesthesia Evaluation  Patient identified by MRN, date of birth, ID bandGeneral Assessment Comment:Sedated on propofol  Reviewed: Allergy & Precautions, NPO status , Patient's Chart, lab work & pertinent test results  Airway Mallampati: Intubated       Dental   Pulmonary    breath sounds clear to auscultation       Cardiovascular hypertension, + CAD, + Past MI, + CABG and +CHF   Rhythm:Regular Rate:Normal     Neuro/Psych    GI/Hepatic negative GI ROS, Neg liver ROS,   Endo/Other  diabetes, Type 2, Insulin Dependent  Renal/GU negative Renal ROS     Musculoskeletal   Abdominal   Peds  Hematology   Anesthesia Other Findings   Reproductive/Obstetrics                            Anesthesia Physical Anesthesia Plan  ASA: IV and emergent  Anesthesia Plan: General   Post-op Pain Management:    Induction: Intravenous and Inhalational  PONV Risk Score and Plan:   Airway Management Planned: Oral ETT  Additional Equipment: Arterial line  Intra-op Plan:   Post-operative Plan: Post-operative intubation/ventilation  Informed Consent: I have reviewed the patients History and Physical, chart, labs and discussed the procedure including the risks, benefits and alternatives for the proposed anesthesia with the patient or authorized representative who has indicated his/her understanding and acceptance.   Only emergency history available and History available from chart only  Plan Discussed with: Anesthesiologist, Surgeon and CRNA  Anesthesia Plan Comments:        Anesthesia Quick Evaluation

## 2017-04-25 NOTE — Sedation Documentation (Signed)
Anesthesia case 

## 2017-04-26 ENCOUNTER — Inpatient Hospital Stay (HOSPITAL_COMMUNITY): Payer: Medicaid Other

## 2017-04-26 DIAGNOSIS — Z978 Presence of other specified devices: Secondary | ICD-10-CM

## 2017-04-26 DIAGNOSIS — I255 Ischemic cardiomyopathy: Secondary | ICD-10-CM

## 2017-04-26 DIAGNOSIS — Z951 Presence of aortocoronary bypass graft: Secondary | ICD-10-CM

## 2017-04-26 DIAGNOSIS — I998 Other disorder of circulatory system: Secondary | ICD-10-CM

## 2017-04-26 DIAGNOSIS — I361 Nonrheumatic tricuspid (valve) insufficiency: Secondary | ICD-10-CM

## 2017-04-26 DIAGNOSIS — R339 Retention of urine, unspecified: Secondary | ICD-10-CM

## 2017-04-26 DIAGNOSIS — J988 Other specified respiratory disorders: Secondary | ICD-10-CM

## 2017-04-26 DIAGNOSIS — J9690 Respiratory failure, unspecified, unspecified whether with hypoxia or hypercapnia: Secondary | ICD-10-CM

## 2017-04-26 DIAGNOSIS — I6522 Occlusion and stenosis of left carotid artery: Secondary | ICD-10-CM

## 2017-04-26 LAB — COMPREHENSIVE METABOLIC PANEL
ALBUMIN: 3 g/dL — AB (ref 3.5–5.0)
ALK PHOS: 50 U/L (ref 38–126)
ALT: 15 U/L — AB (ref 17–63)
AST: 17 U/L (ref 15–41)
Anion gap: 7 (ref 5–15)
BUN: 15 mg/dL (ref 6–20)
CALCIUM: 8.3 mg/dL — AB (ref 8.9–10.3)
CHLORIDE: 105 mmol/L (ref 101–111)
CO2: 19 mmol/L — AB (ref 22–32)
CREATININE: 0.87 mg/dL (ref 0.61–1.24)
GFR calc Af Amer: >60 mL/min (ref >60)
GFR calc non Af Amer: >60 mL/min (ref >60)
GLUCOSE: 131 mg/dL — AB (ref 65–99)
Potassium: 4.3 mmol/L (ref 3.5–5.1)
SODIUM: 131 mmol/L — AB (ref 135–145)
Total Bilirubin: 0.4 mg/dL (ref 0.3–1.2)
Total Protein: 5.4 g/dL — ABNORMAL LOW (ref 6.5–8.1)

## 2017-04-26 LAB — GLUCOSE, CAPILLARY
GLUCOSE-CAPILLARY: 147 mg/dL — AB (ref 65–99)
Glucose-Capillary: 139 mg/dL — ABNORMAL HIGH (ref 65–99)
Glucose-Capillary: 158 mg/dL — ABNORMAL HIGH (ref 65–99)
Glucose-Capillary: 140 mg/dL — ABNORMAL HIGH (ref 65–99)

## 2017-04-26 LAB — BASIC METABOLIC PANEL
ANION GAP: 8 (ref 5–15)
BUN: 14 mg/dL (ref 6–20)
CO2: 18 mmol/L — AB (ref 22–32)
Calcium: 8.3 mg/dL — ABNORMAL LOW (ref 8.9–10.3)
Chloride: 103 mmol/L (ref 101–111)
Creatinine, Ser: 0.87 mg/dL (ref 0.61–1.24)
GFR calc Af Amer: >60 mL/min (ref >60)
GFR calc non Af Amer: >60 mL/min (ref >60)
GLUCOSE: 131 mg/dL — AB (ref 65–99)
POTASSIUM: 4.2 mmol/L (ref 3.5–5.1)
Sodium: 129 mmol/L — ABNORMAL LOW (ref 135–145)

## 2017-04-26 LAB — HEMOGLOBIN A1C
Hgb A1c MFr Bld: 8.8 % — ABNORMAL HIGH (ref 4.8–5.6)
MEAN PLASMA GLUCOSE: 205.86 mg/dL

## 2017-04-26 LAB — CBC
HEMATOCRIT: 28.7 % — AB (ref 39.0–52.0)
HEMOGLOBIN: 10.4 g/dL — AB (ref 13.0–17.0)
MCH: 25.2 pg — AB (ref 26.0–34.0)
MCHC: 36.2 g/dL — AB (ref 30.0–36.0)
MCV: 69.7 fL — ABNORMAL LOW (ref 78.0–100.0)
Platelets: 194 10*3/uL (ref 150–400)
RBC: 4.12 MIL/uL — ABNORMAL LOW (ref 4.22–5.81)
RDW: 14.2 % (ref 11.5–15.5)
WBC: 8.5 10*3/uL (ref 4.0–10.5)

## 2017-04-26 LAB — LIPID PANEL
CHOL/HDL RATIO: 2.6 ratio
Cholesterol: 92 mg/dL (ref 0–200)
HDL: 35 mg/dL — AB (ref >40)
LDL Cholesterol: 41 mg/dL (ref 0–99)
Triglycerides: 82 mg/dL (ref <150)
VLDL: 16 mg/dL (ref 0–40)

## 2017-04-26 LAB — ECHOCARDIOGRAM LIMITED
HEIGHTINCHES: 72 in
Weight: 3120 oz

## 2017-04-26 LAB — HEPARIN LEVEL (UNFRACTIONATED): Heparin Unfractionated: 0.25 IU/mL — ABNORMAL LOW (ref 0.30–0.70)

## 2017-04-26 LAB — TROPONIN I
TROPONIN I: 0.24 ng/mL — AB (ref <0.03)
Troponin I: 0.21 ng/mL (ref <0.03)

## 2017-04-26 MED ORDER — PERFLUTREN LIPID MICROSPHERE
1.0000 mL | INTRAVENOUS | Status: DC | PRN
  Administered 2017-04-26: 2 mL via INTRAVENOUS
  Filled 2017-04-26: qty 10

## 2017-04-26 MED ORDER — PROPOFOL 1000 MG/100ML IV EMUL
0.0000 ug/kg/min | INTRAVENOUS | Status: DC
  Administered 2017-04-26: 25 ug/kg/min via INTRAVENOUS
  Administered 2017-04-27: 30 ug/kg/min via INTRAVENOUS
  Filled 2017-04-26 (×2): qty 100

## 2017-04-26 MED ORDER — HEPARIN (PORCINE) IN NACL 100-0.45 UNIT/ML-% IJ SOLN
1300.0000 [IU]/h | INTRAMUSCULAR | Status: DC
  Administered 2017-04-26: 1050 [IU]/h via INTRAVENOUS
  Administered 2017-04-27: 1150 [IU]/h via INTRAVENOUS
  Administered 2017-04-28: 1300 [IU]/h via INTRAVENOUS
  Filled 2017-04-26 (×5): qty 250

## 2017-04-26 NOTE — Progress Notes (Signed)
Progress Note  Patient Name: [name] Date of Encounter: 04/26/2017  Primary Cardiologist: Stanford Breed  Subjective   He remains intubated however he is following commands, moving his extremities, has made progress. His right foot is now warm. Daughter Gay Filler is in room.    Inpatient Medications    Scheduled Meds: . aspirin EC  81 mg Oral Daily  . atorvastatin  80 mg Per Tube q1800  . chlorhexidine gluconate (MEDLINE KIT)  15 mL Mouth Rinse BID  . docusate sodium  100 mg Oral Daily  . insulin aspart  0-9 Units Subcutaneous Q4H  . mouth rinse  15 mL Mouth Rinse QID  . pantoprazole (PROTONIX) IV  40 mg Intravenous Daily   Continuous Infusions: . sodium chloride 100 mL/hr at 04/26/17 0218  . sodium chloride    . sodium chloride    . clevidipine Stopped (04/25/17 1100)  . magnesium sulfate 1 - 4 g bolus IVPB    . norepinephrine (LEVOPHED) Adult infusion Stopped (04/25/17 2053)  . propofol (DIPRIVAN) infusion 15 mcg/kg/min (04/26/17 0805)   PRN Meds: acetaminophen, bisacodyl, docusate, fentaNYL (SUBLIMAZE) injection, guaiFENesin-dextromethorphan, magnesium sulfate 1 - 4 g bolus IVPB, morphine injection, ondansetron (ZOFRAN) IV, perflutren lipid microspheres (DEFINITY) IV suspension, phenol, potassium chloride   Vital Signs    Vitals:   04/26/17 0700 04/26/17 0748 04/26/17 0757 04/26/17 0800  BP:    94/64  Pulse: 79   78  Resp: 16   14  Temp:  97.9 F (36.6 C)    TempSrc:  Oral    SpO2: 100%  100% 100%  Weight:      Height:        Intake/Output Summary (Last 24 hours) at 04/26/17 0945 Last data filed at 04/26/17 0900  Gross per 24 hour  Intake           2699.5 ml  Output             1640 ml  Net           1059.5 ml   Filed Weights   04/24/17 1818 04/25/17 0515 04/26/17 0500  Weight: 187 lb 8 oz (85 kg) 193 lb 4.8 oz (87.7 kg) 195 lb (88.5 kg)    Telemetry    Normal sinus rhythm - Personally Reviewed  ECG    EKG from operating room reviewed and compared  to prior EKG, there are T-wave inversions noted in the lateral precordial leads as a result of his cardiomyopathy, no significant changes, J-point elevation noted in the early precordial leads similar to prior EKG- Personally Reviewed  Physical Exam   GEN: Well nourished, well developed, on ventilator  HEENT: ET tube in place  Neck: no JVD, carotid bruits, or masses Cardiac: RRR; no murmurs, rubs, or gallops, 1+ lower extremity edema  Respiratory:  No significant crackles, vent noise noted GI: soft, nontender, nondistended, + BS MS: no deformity or atrophy  Skin: warm right foot,  change from yesterday and dry, no rash Neuro:  Mildly sedated, I saw him move his lower extremities Psych: Unable  Labs    Chemistry  Recent Labs Lab 04/25/17 1226 04/25/17 2316 04/26/17 0530  NA 128* 131* 129*  K 4.8 4.3 4.2  CL 101 105 103  CO2 18* 19* 18*  GLUCOSE 246* 131* 131*  BUN _0 CREATININE 1.06 0.87 0.87  CALCIUM 8.2* 8.3* 8.3*  PROT  --  5.4*  --   ALBUMIN 3.2* 3.0*  --   AST  --  17  --   ALT  --  15*  --   ALKPHOS  --  50  --   BILITOT  --  0.4  --   GFRNONAA >60 >60 >60  GFRAA >60 >60 >60  ANIONGAP _0 Hematology  Recent Labs Lab 04/25/17 0959 04/25/17 2316 04/26/17 0530  WBC 8.1 8.8 8.5  RBC 4.63 4.11* 4.12*  HGB 11.5* 10.5* 10.4*  HCT 32.2* 28.5* 28.7*  MCV 69.5* 69.3* 69.7*  MCH 24.8* 25.5* 25.2*  MCHC 35.7 36.8* 36.2*  RDW 14.1 14.2 14.2  PLT 213 194 194    Cardiac Enzymes  Recent Labs Lab 04/24/17 1000 04/25/17 2316 04/26/17 0530  TROPONINI 0.05* 0.21* 0.24*     Recent Labs Lab 04/24/17 1012  TROPIPOC 0.11*     BNP  Recent Labs Lab 04/24/17 1000  BNP 832.8*     DDimer   Recent Labs Lab 04/24/17 1000  DDIMER 0.92*     Radiology    Ct Angio Head W Or Wo Contrast  Result Date: 04/25/2017 CLINICAL DATA:  Code stroke.  71 y/o  M; left-sided weakness. EXAM: CT ANGIOGRAPHY HEAD AND NECK CT HEAD CODE STROKE WITHOUT  CONTRAST TECHNIQUE: Multidetector CT imaging of the head and neck was performed using the standard protocol during bolus administration of intravenous contrast. Multiplanar CT image reconstructions and MIPs were obtained to evaluate the vascular anatomy. Carotid stenosis measurements (when applicable) are obtained utilizing NASCET criteria, using the distal internal carotid diameter as the denominator. CONTRAST:  50 cc Isovue 370 COMPARISON:  None. FINDINGS: CT HEAD FINDINGS Brain: Small lucency in the right insula with loss of gray-white differentiation (series 3, image 13). Chronic lacunar infarction within the right superior thalamus. Moderate chronic microvascular ischemic changes of white matter and parenchymal volume loss of the brain. No acute intracranial hemorrhage identified. No hydrocephalus or effacement of basilar cisterns. Vascular: Right M1 hyperdensity. Extensive calcific atherosclerosis of carotid siphons. Skull: Normal. Negative for fracture or focal lesion. Sinuses: Left maxillary sinus mucous retention cyst. Otherwise negative. Orbits: No acute finding. Review of the MIP images confirms the above findings CTA NECK FINDINGS Aortic arch: 4.2 cm ascending aortic aneurysm. Bovine variant anatomy. Right carotid system: No evidence of dissection, stenosis (50% or greater) or occlusion. The mild calcified plaque of the carotid bifurcation and proximal ICA with mild less than 50% stenosis of proximal ICA. 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 Reconstitution of the left internal carotid artery via external carotid collaterals with poor downstream enhancement of the left internal carotid artery to the terminus indicating slow flow. Vertebral arteries: Right V1 occlusion with reconstitution of the right vertebral artery at the V2 segment. Calcified plaque of left vertebral artery origin with severe stenosis. The left vertebral artery is otherwise normal in caliber  through its course in the neck. Skeleton: The cervical degenerative changes greatest at the C4-5 level were disc disease and a small disc bulge results in mild canal stenosis. Other neck: The mediastinal adenopathy in the lower paratracheal and right upper peritracheal stations, probably due to venous congestion from pulmonary edema. Upper chest: Small right pleural effusion. Smooth interlobular septal thickening compatible with interstitial pulmonary edema. Review of the MIP images confirms the above findings CTA HEAD FINDINGS Anterior circulation: Extensive calcified plaque of the right carotid siphon with a short segment of severe distal cavernous and second short segment of paraclinoid severe stenosis. Poor enhancement of the distal left cavernous  and ophthalmic segments may be due to high-grade stenosis or poor antegrade flow due to left common carotid artery occlusion. Right mid M1 occlusion with poor downstream right MCA collateralization. Patent bilateral anterior cerebral arteries and left middle cerebral artery without significant stenosis. Posterior circulation: No significant stenosis, proximal occlusion, aneurysm, or vascular malformation. Mild proximal basilar stenosis. Short segments of mild stenosis in the bilateral P1 segments. Right V4 segment short segment of mild stenosis secondary to calcified plaque. Venous sinuses: As permitted by contrast timing, patent. Anatomic variants: Small anterior communicating artery. No posterior communicating artery identified, likely hypoplastic or absent. 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 ASPECTS = 9 right MCA infarction. No acute hemorrhage. CT head: 1. Small focus of hypoattenuation within right insula compatible with acute infarct, ASPECTS = 9. 2. Moderate chronic microvascular ischemic changes and moderate parenchymal  volume loss of the brain. 3. Small chronic lacunar infarct in right superior thalamus. CTA neck: 1. Left common carotid artery occlusion. Reconstitution of left internal carotid artery from external carotid artery collaterals. Poor enhancement of left internal carotid artery indicating slow flow. 2. No significant stenosis of the right carotid system in the neck. 3. Right V1 occlusion with reconstitution of right vertebral artery at the V2 segment. 4. Severe left vertebral artery origin stenosis secondary to calcified plaque. 5. 4.2 cm aortic aneurysm. Recommend annual imaging followup by CTA or MRA. This recommendation follows 2010 ACCF/AHA/AATS/ACR/ASA/SCA/SCAI/SIR/STS/SVM Guidelines for the Diagnosis and Management of Patients with Thoracic Aortic Disease. Circulation. 2010; 121: P295-J884. 6. Interstitial pulmonary edema. Enlarged mediastinal lymph nodes is likely related to pulmonary edema. CTA head: 1. Right mid M1 occlusion. Poor downstream right MCA collateralization. 2. Poor enhancement of distal left cavernous and ophthalmic segments of ICA may be due to slow flow secondary to common carotid occlusion or underlying high-grade stenosis. 3. Intracranial atherosclerosis with multiple areas of mild stenosis in the anterior and posterior circulation. 4. No additional large vessel occlusion, aneurysm, or evidence for vascular malformation. These results were called by telephone at the time of interpretation on 04/25/2017 at 12:55 am to Dr. Roland Rack , who verbally acknowledged these results. Electronically Signed   By: Kristine Garbe M.D.   On: 04/25/2017 01:14   Dg Chest 2 View  Result Date: 04/24/2017 CLINICAL DATA:  Short of breath EXAM: CHEST  2 VIEW COMPARISON:  04/17/2017 FINDINGS: Cardiac enlargement without heart failure. Negative for infiltrate or effusion. Negative for mass or adenopathy. Mild right lower lobe atelectasis IMPRESSION: Cardiac enlargement without heart failure Mild  right lower lobe atelectasis. Electronically Signed   By: Franchot Gallo M.D.   On: 04/24/2017 10:34   Ct Head Wo Contrast  Result Date: 04/26/2017 CLINICAL DATA:  One day follow-up status post tPA. Status post mechanical thrombectomy RIGHT M1 occlusion. EXAM: CT HEAD WITHOUT CONTRAST TECHNIQUE: Contiguous axial images were obtained from the base of the skull through the vertex without intravenous contrast. COMPARISON:  MRI of the head April 25, 2017 and CT HEAD April 25, 2017 FINDINGS: BRAIN: No intraparenchymal hemorrhage, mass effect nor midline shift. The ventricles and sulci are normal for age. Patchy supratentorial white matter hypodensities. Old RIGHT thalamus lacunar infarct. No acute large vascular territory infarcts. No abnormal extra-axial fluid collections. Basal cisterns are patent. VASCULAR: Severe calcific atherosclerosis of the carotid siphons. SKULL: No skull fracture. No significant scalp soft tissue swelling. SINUSES/ORBITS: Mild paranasal sinus mucosal  thickening. LEFT maxillary mucosal retention cyst.The included ocular globes and orbital contents are non-suspicious. OTHER: Life-support lines in place. IMPRESSION: 1. No acute intracranial process. Known acute RIGHT MCA territory infarcts not apparent by CT. 2. 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 Moderate chronic small vessel ischemic disease. Electronically Signed   By: Elon Alas M.D.   On: 04/26/2017 01:56   Ct Head Wo Contrast  Result Date: 04/25/2017 CLINICAL DATA:  71 y/o M; post transarterial intervention of stroke. EXAM: CT HEAD WITHOUT CONTRAST TECHNIQUE: Contiguous axial images were obtained from the base of the skull through the vertex without intravenous contrast. COMPARISON:  04/25/2017 CT head FINDINGS: Brain: Stable small lucency within the right insula compatible with region of acute infarction. No new area of loss of gray-white differentiation to indicate interval infarction. No acute hemorrhage. Stable background of  moderate chronic microvascular ischemic changes and parenchymal volume loss of the brain. No hydrocephalus, extra-axial collection, or effacement of basilar cisterns. Vascular: The persisting contrast opacification of the dural venous sinuses and circle of Willis. Skull: Normal. Negative for fracture or focal lesion. Sinuses/Orbits: The left maxillary sinus mucous retention cyst. Fluid within the nasal and oropharynx is likely due to intubation. Other: None. IMPRESSION: Stable small lucency within the right insula compatible with region of acute infarction. No new area of infarction identified. No acute hemorrhage. Electronically Signed   By: Kristine Garbe M.D.   On: 04/25/2017 04:44   Ct Angio Neck W Or Wo Contrast  Result Date: 04/25/2017 CLINICAL DATA:  Code stroke.  71 y/o  M; left-sided weakness. EXAM: CT ANGIOGRAPHY HEAD AND NECK CT HEAD CODE STROKE WITHOUT CONTRAST TECHNIQUE: Multidetector CT imaging of the head and neck was performed using the standard protocol during bolus administration of intravenous contrast. Multiplanar CT image reconstructions and MIPs were obtained to evaluate the vascular anatomy. Carotid stenosis measurements (when applicable) are obtained utilizing NASCET criteria, using the distal internal carotid diameter as the denominator. CONTRAST:  50 cc Isovue 370 COMPARISON:  None. FINDINGS: CT HEAD FINDINGS Brain: Small lucency in the right insula with loss of gray-white differentiation (series 3, image 13). Chronic lacunar infarction within the right superior thalamus. Moderate chronic microvascular ischemic changes of white matter and parenchymal volume loss of the brain. No acute intracranial hemorrhage identified. No hydrocephalus or effacement of basilar cisterns. Vascular: Right M1 hyperdensity. Extensive calcific atherosclerosis of carotid siphons. Skull: Normal. Negative for fracture or focal lesion. Sinuses: Left maxillary sinus mucous retention cyst. Otherwise  negative. Orbits: No acute finding. Review of the MIP images confirms the above findings CTA NECK FINDINGS Aortic arch: 4.2 cm ascending aortic aneurysm. Bovine variant anatomy. Right carotid system: No evidence of dissection, stenosis (50% or greater) or occlusion. The mild calcified plaque of the carotid bifurcation and proximal ICA with mild less than 50% stenosis of proximal ICA. 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 Reconstitution of the left internal carotid artery via external carotid collaterals with poor downstream enhancement of the left internal carotid artery to the terminus indicating slow flow. Vertebral arteries: Right V1 occlusion with reconstitution of the right vertebral artery at the V2 segment. Calcified plaque of left vertebral artery origin with severe stenosis. The left vertebral artery is otherwise normal in caliber through its course in the neck. Skeleton: The cervical degenerative changes greatest at the C4-5 level were disc disease and a small disc bulge results in mild canal stenosis. Other neck: The mediastinal adenopathy in the lower paratracheal and right upper  peritracheal stations, probably due to venous congestion from pulmonary edema. Upper chest: Small right pleural effusion. Smooth interlobular septal thickening compatible with interstitial pulmonary edema. Review of the MIP images confirms the above findings CTA HEAD FINDINGS Anterior circulation: Extensive calcified plaque of the right carotid siphon with a short segment of severe distal cavernous and second short segment of paraclinoid severe stenosis. Poor enhancement of the distal left cavernous and ophthalmic segments may be due to high-grade stenosis or poor antegrade flow due to left common carotid artery occlusion. Right mid M1 occlusion with poor downstream right MCA collateralization. Patent bilateral anterior cerebral arteries and left middle cerebral artery without  significant stenosis. Posterior circulation: No significant stenosis, proximal occlusion, aneurysm, or vascular malformation. Mild proximal basilar stenosis. Short segments of mild stenosis in the bilateral P1 segments. Right V4 segment short segment of mild stenosis secondary to calcified plaque. Venous sinuses: As permitted by contrast timing, patent. Anatomic variants: Small anterior communicating artery. No posterior communicating artery identified, likely hypoplastic or absent. 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 ASPECTS = 9 right MCA infarction. No acute hemorrhage. CT head: 1. Small focus of hypoattenuation within right insula compatible with acute infarct, ASPECTS = 9. 2. Moderate chronic microvascular ischemic changes and moderate parenchymal volume loss of the brain. 3. Small chronic lacunar infarct in right superior thalamus. CTA neck: 1. Left common carotid artery occlusion. Reconstitution of left internal carotid artery from external carotid artery collaterals. Poor enhancement of left internal carotid artery indicating slow flow. 2. No significant stenosis of the right carotid system in the neck. 3. Right V1 occlusion with reconstitution of right vertebral artery at the V2 segment. 4. Severe left vertebral artery origin stenosis secondary to calcified plaque. 5. 4.2 cm aortic aneurysm. Recommend annual imaging followup by CTA or MRA. This recommendation follows 2010 ACCF/AHA/AATS/ACR/ASA/SCA/SCAI/SIR/STS/SVM Guidelines for the Diagnosis and Management of Patients with Thoracic Aortic Disease. Circulation. 2010; 121: H474-Q595. 6. Interstitial pulmonary edema. Enlarged mediastinal lymph nodes is likely related to pulmonary edema. CTA head: 1. Right mid M1 occlusion. Poor downstream right MCA collateralization. 2. Poor enhancement of distal left cavernous and  ophthalmic segments of ICA may be due to slow flow secondary to common carotid occlusion or underlying high-grade stenosis. 3. Intracranial atherosclerosis with multiple areas of mild stenosis in the anterior and posterior circulation. 4. No additional large vessel occlusion, aneurysm, or evidence for vascular malformation. These results were called by telephone at the time of interpretation on 04/25/2017 at 12:55 am to Dr. Roland Rack , who verbally acknowledged these results. Electronically Signed   By: Kristine Garbe M.D.   On: 04/25/2017 01:14   Mr Jeri Cos GL Contrast  Result Date: 04/25/2017 CLINICAL DATA:  Follow-up examination for stroke, status post transarterial intervention for right M1 EVLO. EXAM: MRI HEAD WITHOUT AND WITH CONTRAST TECHNIQUE: Multiplanar, multiecho pulse sequences of the brain and surrounding structures were obtained without and with intravenous contrast. CONTRAST:  40m MULTIHANCE GADOBENATE DIMEGLUMINE 529 MG/ML IV SOLN COMPARISON:  Prior CT from earlier the same day as well as previous studies. FINDINGS: Brain: Generalized age-related cerebral atrophy. Patchy and confluent T2/FLAIR hyperintensity within the periventricular and deep white matter both cerebral hemispheres most consistent with chronic microvascular disease, moderate nature. Remote lacunar infarct with associated chronic hemorrhagic blood products present within the right thalamus. Few additional tiny remote bilateral cerebellar infarcts noted. Mild diffusion abnormality present  within the posterior right lentiform nucleus extending superiorly into the right caudate (series 4, image 18). Additional faint diffusion abnormality within the right insula, most notable inferiorly (series 3, image 27) findings consistent with acute ischemia. No associated mass effect or hemorrhage. No other areas of acute right MCA territory infarction. Additional punctate 5 mm subcortical nonhemorrhagic infarct noted within  the left occipital lobe (series 3, image 26). Gray-white matter differentiation otherwise maintained. No other evidence for acute or subacute ischemia. No evidence for acute intracranial hemorrhage. Few scattered punctate chronic micro hemorrhages noted within the brain and posterior fossa, likely hypertensive in nature. No mass lesion, midline shift or mass effect. No hydrocephalus. No extra-axial fluid collection. Major dural sinuses are grossly patent. No abnormal enhancement. Pituitary gland suprasellar region normal. Vascular: Major intracranial vascular flow voids are maintained. Skull and upper cervical spine: Craniocervical junction within normal limits. Visualized upper cervical spine unremarkable. Bone marrow signal intensity within normal limits. Probable benign hemangioma noted within the C2 vertebral body. No scalp soft tissue abnormality. Sinuses/Orbits: Globes and orbital soft tissues within normal limits. Retention cysts noted within the left maxillary sinus. Paranasal sinuses otherwise largely clear. No mastoid effusion. Inner ear structures normal. Other: Subcentimeter T2 hyperintensity noted within the left parotid gland, of doubtful significance. IMPRESSION: 1. Faint ischemic changes involving the right insula out and basal ganglia as above. No associated hemorrhage or mass effect. 2. Additional punctate 5 mm subcortical left occipital lobe infarct. 3. Remote right thalamic lacunar infarct with additional tiny remote bilateral cerebellar infarcts. 4. Atrophy with moderate chronic microvascular ischemic disease. Electronically Signed   By: Jeannine Boga M.D.   On: 04/25/2017 14:24   Ct Angio Ao+bifem W & Or Wo Contrast  Result Date: 04/25/2017 CLINICAL DATA:  71 year old male with a history of admission for congestive heart failure 24 hours prior to acute inpatient clinical stroke with emergent large vessel occlusion of the right middle cerebral artery. Status post treatment with both  mechanical thrombectomy and systemic, weight based dose of tPA, the right MCA flow has been restored, however, right foot pulses were discovered to be absent. Diagnostic CT imaging was performed to evaluate the right lower extremity arterial system. EXAM: CT ANGIOGRAPHY OF ABDOMINAL AORTA WITH ILIOFEMORAL RUNOFF TECHNIQUE: Multidetector CT imaging of the abdomen, pelvis and lower extremities was performed using the standard protocol during bolus administration of intravenous contrast. Multiplanar CT image reconstructions and MIPs were obtained to evaluate the vascular anatomy. CONTRAST:  100 cc Isovue 3 7 COMPARISON:  Chest CT 04/18/2017 FINDINGS: VASCULAR Aorta: Atherosclerotic plaque of the abdominal aorta with mixed calcified and soft plaque. No aneurysm or dissection of the visualized lower abdominal aorta. IMA: Inferior mesenteric artery is patent. Right lower extremity: Mild soft and calcified plaque of the right iliac system. Right common iliac artery measures 13 mm. No dissection. Hypogastric artery is patent. Moderate atherosclerotic changes of the right hypogastric system. Right external iliac artery patent with mild atherosclerotic changes. Irregular intimal contour of the right common femoral artery with luminal narrowing, at the site of recent arterial access. Common femoral artery remains patent throughout its course to the bifurcation. Profunda femoris remains patent, as well as the thigh branches. There is proximal occlusion of the superficial femoral artery. Circumferential calcified plaque of the length of the superficial femoral artery with background of atherosclerotic changes extending through the popliteal artery. No contrast is visualized through the proximal 2/3 of the SFA. Collaterals partially fill the distal SFA and above knee popliteal artery. No  contrast identified through the length of the tibial vessels. Baseline calcified disease present through the length of the tibial vessels  including predominantly anterior tibial artery and posterior tibial artery. Left lower extremity: Mixed calcified and soft plaque of the left iliac system. Diameter of the left common iliac artery measures 12 mm. No dissection flap. Hypogastric arteries patent with moderate atherosclerotic changes. External iliac artery patent with mild plaque. Common femoral artery patent with mild plaque. Profunda femoris patent as well as the thigh branches. Superficial femoral artery demonstrates mild to moderate calcified and soft plaque with no significant stenosis identified. Popliteal artery appears patent through its length with mild plaque. There is decrease contrast column within the distal popliteal artery, with decrease contrast filling of the tibial vessels. There does appear to be discontinuous contrast flow of the peroneal artery to the ankle, with questionable filling of the anterior tibial and posterior tibial vessels. Baseline calcified disease of the tibial vessels, predominantly anterior tibial artery and posterior tibial artery. Veins: Unremarkable appearance of the venous system. Review of the MIP images confirms the above findings. NON-VASCULAR Urinary tract: Incomplete visualization of the bilateral kidneys. Likely Bosniak 1 cyst at the inferior left kidney. Excreted contrast within the urinary bladder. Stomach/Bowel: Unremarkable appearance of visualized small bowel. Unremarkable appearance of the visualized colon. Lymphatic: Multiple lymph nodes in the para-aortic nodal station, none of which are enlarged. Mesenteric: No adenopathy. Reproductive: Prostate diameter not enlarged. Other: Gas within the anterior abdominal soft tissues, potentially secondary to injections. Compression dressing on the right common femoral region. Musculoskeletal: Degenerative changes of the lower lumbar spine. No displaced fracture. IMPRESSION: Occlusion of the right SFA just after the origin, with patent stump. There is minimal  collateral filling of the distal SFA and popliteal artery via patent profunda femoris. There is also absent contrast flow within the right-sided tibial vasculature to the ankle. On the left, contrast flow is maintained through the popliteal artery, however, there is decrease contrast within the anterior tibial artery and posterior tibial artery. There appears to be at least partial filling of the peroneal artery to the ankle. This may be related to additional emboli versus baseline vascular disease. Moderate atherosclerotic changes of the bilateral iliac, femoral-popliteal, and tibial vasculature, with calcifications through the length of the right SFA, right popliteal artery, and bilateral tibial arteries. Irregularity of the intimal surface of the right common femoral artery, which is patent with mild narrowing, at the site of recent femoral arterial sheath access. No evidence of hematoma with compression dressing in place. Signed, Dulcy Fanny. Earleen Newport, DO [facility] Electronically Signed   By: Corrie Mckusick D.O.   On: 04/25/2017 05:06   Dg Chest Port 1 View  Result Date: 04/26/2017 CLINICAL DATA:  Respiratory failure EXAM: PORTABLE CHEST 1 VIEW COMPARISON:  04/25/2017 FINDINGS: Endotracheal tube in good position.  NG tube in the stomach. Interval improvement in vascular congestion and mild edema. Progression of left lower lobe consolidation and effusion. Small right effusion has developed. IMPRESSION: Improvement in mild pulmonary edema. Progression of left lower lobe airspace disease and left effusion. Electronically Signed   By: Franchot Gallo M.D.   On: 04/26/2017 07:18   Dg Chest Port 1 View  Result Date: 04/25/2017 CLINICAL DATA:  Endotracheal intubation. EXAM: PORTABLE CHEST 1 VIEW COMPARISON:  04/24/2017 FINDINGS: Postoperative changes in the mediastinum. Endotracheal tube with tip measuring 3.5 cm above the carina. Enteric tube tip is off the  field of view but below the  left hemidiaphragm. Shallow inspiration. Cardiac enlargement. Mild developing pulmonary vascular congestion. Possible early perihilar edema. No blunting of costophrenic angles. No pneumothorax. IMPRESSION: Appliances appear in satisfactory location. Cardiac enlargement with mild developing pulmonary vascular congestion and possible early perihilar edema. Electronically Signed   By: Lucienne Capers M.D.   On: 04/25/2017 05:40   Ir Percutaneous Art Thrombectomy/infusion Intracranial Inc Diag Angio  Result Date: 04/25/2017 INDICATION: 71 year old male admitted with a history of congestive heart failure. Acute mental status change recognized at approximately mid night, with last known well of 10:30 p.m. with acute code stroke activation. Imaging demonstrates right M1 occlusion corresponding to the patient's dense left-sided symptoms. EXAM: ULTRASOUND GUIDED ACCESS RIGHT COMMON FEMORAL ARTERY CERVICAL AND CEREBRAL ANGIOGRAM MECHANICAL THROMBECTOMY RIGHT MCA OCCLUSION COMPARISON:  CT AND CT ANGIOGRAM IMAGING OF THE SAME DATE MEDICATIONS: None ANESTHESIA/SEDATION: General endotracheal anesthesia CONTRAST:  40 cc Isovue FLUOROSCOPY TIME:  Fluoroscopy Time: 12 minutes 18 seconds (839 mGy). COMPLICATIONS: None TECHNIQUE: Informed written consent was obtained from the patient's family after a thorough discussion of the procedural risks, benefits and alternatives. Specific risks discussed include: Bleeding, infection, contrast reaction, kidney injury/failure, need for further procedure/surgery, arterial injury or dissection, embolization to new territory, intracranial hemorrhage (10-15% risk), neurologic deterioration, cardiopulmonary collapse, death. All questions were addressed. Maximal Sterile Barrier Technique was utilized including during the procedure including caps, mask, sterile gowns, sterile gloves, sterile drape, hand hygiene and skin antiseptic. A timeout was performed prior to the  initiation of the procedure. FINDINGS: Initial: Right common carotid artery:  Normal course caliber and contour. Right external carotid artery: Patent with antegrade flow. Right internal carotid artery: Mildly tortuous right cervical ICA with no significant atherosclerotic changes at the carotid bulb. No significant stenosis. Right MCA: Initial angiogram demonstrates proximal occlusion of the right middle cerebral artery. There is an early downgoing temporal branch which remains patent. Initial flow is TICI 0. Moderate collateral flow into the MCA territory from [facility] and transcranial branches. Right ACA: A 1 segment patent. A 2 segment perfuses the right ACA territory. Cross filling of the patent anterior communicating artery with the right-sided carotid flow perfusing the left anterior cerebral artery territory. There is significant competing arterial flow from the left anterior cerebral artery, with to and fro flow of the left A1 segment, compatible with adequate collateralization of the left MCA system. Late phase of the right common injection demonstrates partial opacification of the left cervical ICA and proximal MCA. Completion: After single pass of mechanical thrombectomy stent solitaire device, there is restoration of TICI 3 flow of the right MCA territory. Femoral artery angiogram demonstrates patent common femoral artery with appropriate puncture site. Profunda femoris patent with occlusion of the proximal superficial femoral artery. No Doppler signal of the right pedal pulses at the completion of the study. PROCEDURE: The patient is brought to the interventional radiology suite and placed in the supine position on the IR table. The anesthesia team was present for induction of general endotracheal anesthesia. Anesthesia team initiated a left radial arterial line. After patient preparation was performed and a time-out procedure to confirm patient and procedure and consent, ultrasound survey of the right  inguinal region was performed with images stored and sent to PACs. 11 blade scalpel was used to make a small incision. Blunt dissection was performed. A micropuncture needle was used access the right common femoral artery under ultrasound. With excellent arterial blood flow returned, an .018 micro wire was passed through the needle, observed to enter the abdominal aorta  under fluoroscopy. The needle was removed, and a micropuncture sheath was placed over the wire. The inner dilator and wire were removed, and an 035 Bentson wire was advanced under fluoroscopy into the abdominal aorta. The sheath was removed and a standard 5 [place] vascular sheath was placed. The dilator was removed and the sheath was flushed. A 69F JB-1 diagnostic catheter was advanced over the wire to the proximal descending thoracic aorta. Wire was then removed. Double flush of the catheter was performed. Catheter was then used to navigate through the innominate artery and into the right common carotid artery. Formal angiogram was performed. Exchange length Rosen wire was then passed through the diagnostic catheter to the distal common carotid artery and into the proximal right internal carotid artery and the diagnostic catheter was removed. The 5 French sheath was removed and exchanged for 8 French 55 centimeter BrightTip sheath. Sheath was flushed and attached to pressurized and heparinized saline bag for constant forward flow. Then an 8 [place], 85 cm Flowgate balloon tip catheter was prepared on the back table with inflation of the balloon with 50/50 concentration of dilute contrast. The balloon catheter was then advanced over the wire, positioned into the internal carotid artery. Copious back flush was performed and the balloon catheter was attached to heparinized and pressurized saline bag for forward flow. Angiogram was performed for road map guide. Microcatheter system was then introduced through the balloon guide catheter, using a synchro  soft 014 wire and a Trevo Provue18 catheter. Microcatheter system was advanced into the internal carotid artery, to the level of the occlusion. The micro wire was then carefully advanced through the occluded segment. Microcatheter was then push through the occluded segment and the wire was removed. Blood was then aspirated through the hub of the microcatheter, and a gentle contrast injection was performed confirming intraluminal position. A rotating hemostatic valve was then attached to the back end of the microcatheter, and a pressurized and heparinized saline bag was attached to the catheter. 4 x 40 solitaire device was then selected. Back flush was achieved at the rotating hemostatic valve, and then the device was gently advanced through the microcatheter to the distal end. The retriever was then unsheathed by withdrawing the microcatheter under fluoroscopy. Once the retriever was completely unsheathed, a 3 minute time period was observed. The balloon at the balloon tip catheter was then inflated under fluoroscopy for proximal flow arrest. Constant aspiration was then performed at the tip of the balloon guide catheter as the retriever was gently and slowly withdrawn with fluoroscopic observation. Once the retriever was entirely removed from the system, free aspiration was confirmed at the hub of the balloon guide catheter, with free blood return confirmed. The balloon was then deflated, rotating hemostatic valve was reattached, and a control angiogram was performed. Control angiogram was performed at the right common femoral artery puncture site. The 55 centimeter 8 French sheath was removed over roadrunner 035 wire. Abbott Proglide suture mediated closure was attempted, with failure of the suture. Manual pressure was applied. Gel-Foam was inserted into the soft tissue tract within the incision. Pressure dressing was applied. Patient tolerated the procedure well and remained hemodynamically stable throughout. No  complications were encountered and no significant blood loss encountered. IMPRESSION: Status post ultrasound guided access right common femoral artery with cerebral angiogram and mechanical thrombectomy for treatment of acute ELVO of right M1, with restoration of TICI 3 flow after 1 pass. Completion angiogram at the right common femoral artery puncture demonstrates proximal occlusion  of the superficial femoral artery, and there are absent pedal pulses on Doppler examination at the right foot. Signed, Dulcy Fanny. Earleen Newport DO [facility] Radiology PLAN: Patient will have a noncontrast head CT performed. Patient will have CT angiogram runoff of the abdomen and lower extremities given the absent pedal pulses. Patient will be admitted to ICU maintained on the ventilator. Sheath has been withdrawn from the right common femoral artery, with failure of suture mediated device. Case has been discussed with vascular surgery. Compression dressing should remain for 6 hours. Electronically Signed   By: Corrie Mckusick D.O.   On: 04/25/2017 05:41   Ct Head Code Stroke Wo Contrast  Result Date: 04/25/2017 CLINICAL DATA:  Code stroke.  71 y/o  M; left-sided weakness. EXAM: CT ANGIOGRAPHY HEAD AND NECK CT HEAD CODE STROKE WITHOUT CONTRAST TECHNIQUE: Multidetector CT imaging of the head and neck was performed using the standard protocol during bolus administration of intravenous contrast. Multiplanar CT image reconstructions and MIPs were obtained to evaluate the vascular anatomy. Carotid stenosis measurements (when applicable) are obtained utilizing NASCET criteria, using the distal internal carotid diameter as the denominator. CONTRAST:  50 cc Isovue 370 COMPARISON:  None. FINDINGS: CT HEAD FINDINGS Brain: Small lucency in the right insula with loss of gray-white differentiation (series 3, image 13). Chronic lacunar infarction within the right superior thalamus. Moderate chronic  microvascular ischemic changes of white matter and parenchymal volume loss of the brain. No acute intracranial hemorrhage identified. No hydrocephalus or effacement of basilar cisterns. Vascular: Right M1 hyperdensity. Extensive calcific atherosclerosis of carotid siphons. Skull: Normal. Negative for fracture or focal lesion. Sinuses: Left maxillary sinus mucous retention cyst. Otherwise negative. Orbits: No acute finding. Review of the MIP images confirms the above findings CTA NECK FINDINGS Aortic arch: 4.2 cm ascending aortic aneurysm. Bovine variant anatomy. Right carotid system: No evidence of dissection, stenosis (50% or greater) or occlusion. The mild calcified plaque of the carotid bifurcation and proximal ICA with mild less than 50% stenosis of proximal ICA. 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 Reconstitution of the left internal carotid artery via external carotid collaterals with poor downstream enhancement of the left internal carotid artery to the terminus indicating slow flow. Vertebral arteries: Right V1 occlusion with reconstitution of the right vertebral artery at the V2 segment. Calcified plaque of left vertebral artery origin with severe stenosis. The left vertebral artery is otherwise normal in caliber through its course in the neck. Skeleton: The cervical degenerative changes greatest at the C4-5 level were disc disease and a small disc bulge results in mild canal stenosis. Other neck: The mediastinal adenopathy in the lower paratracheal and right upper peritracheal stations, probably due to venous congestion from pulmonary edema. Upper chest: Small right pleural effusion. Smooth interlobular septal thickening compatible with interstitial pulmonary edema. Review of the MIP images confirms the above findings CTA HEAD FINDINGS Anterior circulation: Extensive calcified plaque of the right carotid siphon with a short segment of severe distal cavernous and  second short segment of paraclinoid severe stenosis. Poor enhancement of the distal left cavernous and ophthalmic segments may be due to high-grade stenosis or poor antegrade flow due to left common carotid artery occlusion. Right mid M1 occlusion with poor downstream right MCA collateralization. Patent bilateral anterior cerebral arteries and left middle cerebral artery without significant stenosis. Posterior circulation: No significant stenosis, proximal occlusion, aneurysm, or vascular malformation. Mild proximal basilar stenosis. Short segments of mild stenosis in  the bilateral P1 segments. Right V4 segment short segment of mild stenosis secondary to calcified plaque. Venous sinuses: As permitted by contrast timing, patent. Anatomic variants: Small anterior communicating artery. No posterior communicating artery identified, likely hypoplastic or absent. 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 ASPECTS = 9 right MCA infarction. No acute hemorrhage. CT head: 1. Small focus of hypoattenuation within right insula compatible with acute infarct, ASPECTS = 9. 2. Moderate chronic microvascular ischemic changes and moderate parenchymal volume loss of the brain. 3. Small chronic lacunar infarct in right superior thalamus. CTA neck: 1. Left common carotid artery occlusion. Reconstitution of left internal carotid artery from external carotid artery collaterals. Poor enhancement of left internal carotid artery indicating slow flow. 2. No significant stenosis of the right carotid system in the neck. 3. Right V1 occlusion with reconstitution of right vertebral artery at the V2 segment. 4. Severe left vertebral artery origin stenosis secondary to calcified plaque. 5. 4.2 cm aortic aneurysm. Recommend annual imaging followup by CTA or MRA. This recommendation follows 2010  ACCF/AHA/AATS/ACR/ASA/SCA/SCAI/SIR/STS/SVM Guidelines for the Diagnosis and Management of Patients with Thoracic Aortic Disease. Circulation. 2010; 121: W737-T062. 6. Interstitial pulmonary edema. Enlarged mediastinal lymph nodes is likely related to pulmonary edema. CTA head: 1. Right mid M1 occlusion. Poor downstream right MCA collateralization. 2. Poor enhancement of distal left cavernous and ophthalmic segments of ICA may be due to slow flow secondary to common carotid occlusion or underlying high-grade stenosis. 3. Intracranial atherosclerosis with multiple areas of mild stenosis in the anterior and posterior circulation. 4. No additional large vessel occlusion, aneurysm, or evidence for vascular malformation. These results were called by telephone at the time of interpretation on 04/25/2017 at 12:55 am to Dr. Roland Rack , who verbally acknowledged these results. Electronically Signed   By: Kristine Garbe M.D.   On: 04/25/2017 01:14    Cardiac Studies   Personally reviewed echocardiogram from last week-no thrombus in left ventricle. EF 20-25%.  Limited echocardiogram on 04/26/17  - No evidence of LV thrombus with Definity contrast  - EF 20-25%  Patient Profile     71 y.o. male  readmitted with acute on chronic systolic heart failure who suffered an acute in-hospital right MCA stroke, presumed cardioembolic status post thrombectomy of MCA by interventional radiology with subsequent superior femoral artery occlusion status post thrombectomy by Dr. Bridgett Larsson of vascular surgery, ejection fraction 25%, with no LV thrombus noted with Definity contrast  Assessment & Plan    Acute on chronic systolic heart failure   - he received IV Lasix on admission and in the emergency room and showed improvement clinically with less shortness of breath and decreased orthopnea prior to his stroke.  - He has had mild increase in left pleural effusion noted on chest x-ray but overall pulmonary edema is  improved on ventilator.  - His blood pressures have remained fairly soft on sedation, in the upper 80s, upper 69S systolic.  - For now, we will continue to hold his IV Lasix administration, however we will have a low threshold for reinitiation if any signs of poor oxygenation or worsening pulmonary edema develops. If critical care team feels that more Lasix as needed, please go right ahead.  - We were holding his carvedilol 3.125 and his losartan 25 mg because of recent hypotension in the setting of acute stroke.  - After extubation, we will gently add back  these medications once again.  - Stopped potassium  - Trying to allow for higher blood pressure to allow for improved perfusion post stroke  Acute right MCA stroke  - Likely cardioembolic source despite Definity contrast showing no evidence of current thrombo-embolic source in the left ventricle. Certainly with his markedly reduced ejection fraction he is at risk for thrombus development.  - Interventional radiology/neurology-appreciate immediate teamwork   - ASA  - Would like to start IV heparin when okayed by neurology. Obviously this will be a decision based upon the size and severity of his right MCA stroke. He would be at increased risk for hemorrhagic transformation if stroke size is large. Await neurology's final recommendations following scanning.  Acute right superficial femoral artery thrombus  - Status post thrombectomy, Dr. Bridgett Larsson. The distal popliteal artery and bifurcation into the anterior tibial artery and tibioperoneal trunk was essentially chronically occluded. He was able to retrieve subacute thrombus from the femoral-popliteal artery.  - He now has a warm right foot with improved perfusion  - During surgery there was concern about ST segment changes, I reviewed his ECGs surrounding the surgery and albeit abnormal ST segments and T waves, there is no significant change from prior. These ST segment changes and T-wave changes are  indicative of his underlying cardiomyopathy.   Acute respiratory failure  - Currently on ventilator for support in the setting of acute stroke. Hopefully, he will have extubation soon. Once again, if IV Lasix as needed this is fine.  - Management per critical care team  Hyponatremia  - Likely from underlying cardiomyopathy, decreased cardiac output  - This is a prognostic indicator for worsening heart failure.  Critical care time 35 minutes spent with extensive data review, discussion with family, nursing team in this gentleman with multisystem organ failure, recent stroke, dilated cardiomyopathy, femoral artery thrombectomy.   Signed, Candee Furbish, MD  04/26/2017, 9:45 AM

## 2017-04-26 NOTE — Evaluation (Signed)
Occupational Therapy Evaluation Patient Details Name: [name] MRN: [id number] DOB: April 23, 1946 Today's Date: 04/26/2017    History of Present Illness 71 y.o.malewith a history of MI and CABG 2007, DM type 2, HTN, stroke and HLD who presented for SOB possible CHF exacerbation. During hosptial course developed facial droop, Lt side weakness. Code stroke called, imaging CTA + Right MCA M1 embolic occlusion. Patient taken to IR and received TPA.   Clinical Impression   PTA Pt independent in ADL and mobility with SPC. Pt is currently max A to total for ADL and mod A +2  For bed mobility and line management. Pt able to follow one step commands with increased time and multimodal cues this session, more weakness in LUE than RUE. Vision will continue to require assessment once Pt can come off the vent as Pt had deficits in tracking past midline to the left. Pt will require continued skilled OT in the acute setting and afterwards at CIR level to return to PLOF/maximize safety and independence in ADL and functional transfers.     Follow Up Recommendations  CIR;Supervision/Assistance - 24 hour    Equipment Recommendations  Other (comment) (defer to next venue)    Recommendations for Other Services Rehab consult     Precautions / Restrictions Precautions Precautions: Other (comment);Fall (vent) Precaution Comments: JP drain RLE, ET ventilation, Art line in place Restrictions Weight Bearing Restrictions: No      Mobility Bed Mobility Overal bed mobility: Needs Assistance Bed Mobility: Supine to Sit;Sit to Supine     Supine to sit: Mod assist;+2 for physical assistance;HOB elevated Sit to supine: Mod assist;+2 for physical assistance;HOB elevated   General bed mobility comments: Pt able to move from supine to sit with mod assist of PT/OT for management of vent, lines and leads. Patient was able to move bilateral LEs toward EOB, increased physical assist to elevate trunk to upright,  patient with posterior bias upon coming to EOB. Patient able to initiate return to supine, assist to control trunk and elevate LEs back to bed.   Transfers                 General transfer comment: deferred at this time    Balance Overall balance assessment: Needs assistance Sitting-balance support: Feet unsupported;Bilateral upper extremity supported Sitting balance-Leahy Scale: Poor Sitting balance - Comments: patient with posterior bias, min to moderate assist at EOB. (tolerate ~ 10 min EOB) Postural control: Posterior lean                                 ADL either performed or assessed with clinical judgement   ADL Overall ADL's : Needs assistance/impaired                                       General ADL Comments: Pt is currently max to total A for all ADL due to restraints (due to intubation) and expect Pt to make quick progress     Vision   Vision Assessment?: Yes;Vision impaired- to be further tested in functional context Eye Alignment: Within Functional Limits Ocular Range of Motion: Within Functional Limits Alignment/Gaze Preference: Within Defined Limits Tracking/Visual Pursuits: Decreased smoothness of eye movement to LEFT superior field;Decreased smoothness of eye movement to LEFT inferior field;Impaired - to be further tested in functional context Additional Comments: when tracking, Pt does  not track past midline without auditory cues, able to follow simple commands with increased time     Perception     Praxis      Pertinent Vitals/Pain Pain Assessment: Faces Faces Pain Scale: Hurts a little bit Pain Location: irritation with intubation Pain Intervention(s): Limited activity within patient's tolerance;Repositioned     Hand Dominance     Extremity/Trunk Assessment Upper Extremity Assessment Upper Extremity Assessment: Generalized weakness;LUE deficits/detail;Difficult to assess due to impaired cognition LUE Deficits /  Details: LUE seems weaker than the right in generalized terms, will continue to assess   Lower Extremity Assessment Lower Extremity Assessment: Defer to PT evaluation   Cervical / Trunk Assessment Cervical / Trunk Assessment: Normal   Communication Communication Communication: Prefers language other than [place];Other (comment) (speaks [place], daughter translated this session, on the vent)   Cognition Arousal/Alertness: Awake/alert Behavior During Therapy: WFL for tasks assessed/performed;Restless Overall Cognitive Status: Impaired/Different from baseline Area of Impairment: Attention;Following commands;Problem solving                   Current Attention Level: Sustained   Following Commands: Follows one step commands consistently;Follows one step commands with increased time     Problem Solving: Requires verbal cues;Requires tactile cues General Comments: on the vent, following simple directions during session   General Comments  Pt's daughter present and providing translation    Exercises     Shoulder Instructions      Home Living Family/patient expects to be discharged to:: Private residence Living Arrangements: Children Available Help at Discharge: Family;Available 24 hours/day Type of Home: House Home Access: Stairs to enter [employer] of Steps: 4   Home Layout: One level     Bathroom Shower/Tub: Chief Strategy Officer: Standard     Home Equipment: Cane - single point          Prior Functioning/Environment Level of Independence: Independent with assistive device(s)        Comments: used SPC for ambulation        OT Problem List: Decreased strength;Decreased range of motion;Impaired balance (sitting and/or standing);Decreased activity tolerance;Impaired vision/perception;Decreased coordination;Decreased safety awareness;Decreased cognition;Impaired UE functional use      OT Treatment/Interventions: Self-care/ADL  training;Therapeutic exercise;Neuromuscular education;Energy conservation;DME and/or AE instruction;Therapeutic activities;Cognitive remediation/compensation;Visual/perceptual remediation/compensation;Patient/family education;Balance training    OT Goals(Current goals can be found in the care plan section) Acute Rehab OT Goals Patient Stated Goal: none stated OT Goal Formulation: Patient unable to participate in goal setting ADL Goals Pt Will Perform Grooming: with min assist;sitting Pt Will Perform Upper Body Bathing: with min assist;sitting Additional ADL Goal #1: Pt will perform bed mobility at min A level prior to initiating ADL activity Additional ADL Goal #2: Pt will follow one step commands 80% of the time while sitting EOB during session  OT Frequency: Min 3X/week   Barriers to D/C:            Co-evaluation PT/OT/SLP Co-Evaluation/Treatment: Yes Reason for Co-Treatment: Complexity of the patient's impairments (multi-system involvement);Necessary to address cognition/behavior during functional activity;For patient/therapist safety PT goals addressed during session: Mobility/safety with mobility;Balance;Strengthening/ROM OT goals addressed during session: ADL's and self-care      AM-PAC PT "6 Clicks" Daily Activity     Outcome Measure Help from another person eating meals?: Total Help from another person taking care of personal grooming?: A Lot Help from another person toileting, which includes using toliet, bedpan, or urinal?: Total Help from another person bathing (including washing, rinsing, drying)?: A Lot Help  from another person to put on and taking off regular upper body clothing?: A Lot Help from another person to put on and taking off regular lower body clothing?: Total 6 Click Score: 9   End of Session Nurse Communication: Mobility status  Activity Tolerance: Patient tolerated treatment well Patient left: in bed;with call bell/phone within reach;with bed alarm  set;with family/visitor present;with SCD's reapplied;with restraints reapplied  OT Visit Diagnosis: Muscle weakness (generalized) (M62.81);Low vision, both eyes (H54.2);Other symptoms and signs involving the nervous system (R29.898);Other symptoms and signs involving cognitive function                Time: 3086-5784 OT Time Calculation (min): 23 min Charges:  OT General Charges $OT Visit: 1 Visit OT Evaluation $OT Eval High Complexity: 1 High G-Codes:     Sherryl Manges OTR/L [phone]  Evern Bio Hollee Fate 04/26/2017, 11:51 AM

## 2017-04-26 NOTE — Progress Notes (Signed)
Dr. Roda ShuttersXu informed of delay of MRI for intubated patients. No new verbal orders given.

## 2017-04-26 NOTE — Progress Notes (Signed)
PULMONARY / CRITICAL CARE MEDICINE   Name: [name] MRN: [id number] DOB: Jan 10, 1946    ADMISSION DATE:  04/24/2017 CONSULTATION DATE:  04/25/17  REFERRING MD:  Dr. Loreta Ave  CHIEF COMPLAINT:  Vent management  HISTORY OF PRESENT ILLNESS:   71 yo male developed shortness of breath, facial droop, Lt side weakness.  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  Given tPA, intubated for airway protection, and had thrombectomy of Rt M1.  Developed Rt leg ischemia after procedure likely from embolic event.  PMHx of CAD, ischemic CM, HTN, DM, HLD, CVA.  SUBJECTIVE:  Tolerating pressure support.  VITAL SIGNS: BP 92/67   Pulse 86   Temp 97.9 F (36.6 C) (Oral)   Resp 19   Ht 6' (1.829 m)   Wt 195 lb (88.5 kg)   SpO2 99%   BMI 26.45 kg/m   VENTILATOR SETTINGS: Vent Mode: PSV;CPAP FiO2 (%):  [40 %] 40 % Set Rate:  [12 bmp] 12 bmp Vt Set:  [550 mL] 550 mL PEEP:  [5 cmH20] 5 cmH20 Pressure Support:  [5 cmH20] 5 cmH20 Plateau Pressure:  [14 cmH20-18 cmH20] 16 cmH20  INTAKE / OUTPUT: I/O last 3 completed shifts: In: 4422.4 [P.O.:240; I.V.:3882.4; IV Piggyback:300] Out: 1665 [Urine:1255; Drains:60; Other:200; Blood:150]  PHYSICAL EXAMINATION:  General - more alert Eyes - pupils reactive ENT - ETT in place Cardiac - regular, no murmur Chest - no wheeze, rales Abd - soft, non tender Ext - 1+ edema Skin - no rashes Neuro - following commands    LABS:  BMET  Recent Labs Lab 04/25/17 1226 04/25/17 2316 04/26/17 0530  NA 128* 131* 129*  K 4.8 4.3 4.2  CL 101 105 103  CO2 18* 19* 18*  BUN CREATININE 1.06 0.87 0.87  GLUCOSE 246* 131* 131*    Electrolytes  Recent Labs Lab 04/25/17 0959 04/25/17 1226 04/25/17 2316 04/26/17 0530  CALCIUM 8.1* 8.2* 8.3* 8.3*  MG 1.8  --   --   --   PHOS  --  4.4  --   --     CBC  Recent Labs Lab 04/25/17 0959 04/25/17 2316 04/26/17 0530  WBC 8.1 8.8 8.5  HGB 11.5* 10.5* 10.4*  HCT  32.2* 28.5* 28.7*  PLT 213 194 194    Coag's  Recent Labs Lab 04/25/17 0100  APTT 34  INR 1.14    Sepsis Markers No results for input(s): LATICACIDVEN, PROCALCITON, O2SATVEN in the last 168 hours.  ABG  Recent Labs Lab 04/25/17 0510 04/25/17 0719  PHART 7.387 7.351  PCO2ART 33.8 33.9  PO2ART 146* 70.0*    Liver Enzymes  Recent Labs Lab 04/25/17 1226 04/25/17 2316  AST  --  17  ALT  --  15*  ALKPHOS  --  50  BILITOT  --  0.4  ALBUMIN 3.2* 3.0*    Cardiac Enzymes  Recent Labs Lab 04/24/17 1000 04/25/17 2316 04/26/17 0530  TROPONINI 0.05* 0.21* 0.24*    Glucose  Recent Labs Lab 04/25/17 1003 04/25/17 1208 04/25/17 1642 04/25/17 1948 04/25/17 2322 04/26/17 0751  GLUCAP 244* 238* 188* 156* 129* 139*    Imaging Ct Head Wo Contrast  Result Date: 04/26/2017 CLINICAL DATA:  One day follow-up status post tPA. Status post mechanical thrombectomy RIGHT M1 occlusion. EXAM: CT HEAD WITHOUT CONTRAST TECHNIQUE: Contiguous axial images were obtained from the base of the skull through the vertex without intravenous contrast. COMPARISON:  MRI of  the head April 25, 2017 and CT HEAD April 25, 2017 FINDINGS: BRAIN: No intraparenchymal hemorrhage, mass effect nor midline shift. The ventricles and sulci are normal for age. Patchy supratentorial white matter hypodensities. Old RIGHT thalamus lacunar infarct. No acute large vascular territory infarcts. No abnormal extra-axial fluid collections. Basal cisterns are patent. VASCULAR: Severe calcific atherosclerosis of the carotid siphons. SKULL: No skull fracture. No significant scalp soft tissue swelling. SINUSES/ORBITS: Mild paranasal sinus mucosal thickening. LEFT maxillary mucosal retention cyst.The included ocular globes and orbital contents are non-suspicious. OTHER: Life-support lines in place. IMPRESSION: 1. No acute intracranial process. Known acute RIGHT MCA territory infarcts not apparent by CT. 2. Old RIGHT  thalamus infarct. Moderate chronic small vessel ischemic disease. Electronically Signed   By: Awilda Metro M.D.   On: 04/26/2017 01:56   Mr Laqueta Jean ZO Contrast  Result Date: 04/25/2017 CLINICAL DATA:  Follow-up examination for stroke, status post transarterial intervention for right M1 EVLO. EXAM: MRI HEAD WITHOUT AND WITH CONTRAST TECHNIQUE: Multiplanar, multiecho pulse sequences of the brain and surrounding structures were obtained without and with intravenous contrast. CONTRAST:  18mL MULTIHANCE GADOBENATE DIMEGLUMINE 529 MG/ML IV SOLN COMPARISON:  Prior CT from earlier the same day as well as previous studies. FINDINGS: Brain: Generalized age-related cerebral atrophy. Patchy and confluent T2/FLAIR hyperintensity within the periventricular and deep white matter both cerebral hemispheres most consistent with chronic microvascular disease, moderate nature. Remote lacunar infarct with associated chronic hemorrhagic blood products present within the right thalamus. Few additional tiny remote bilateral cerebellar infarcts noted. Mild diffusion abnormality present within the posterior right lentiform nucleus extending superiorly into the right caudate (series 4, image 18). Additional faint diffusion abnormality within the right insula, most notable inferiorly (series 3, image 27) findings consistent with acute ischemia. No associated mass effect or hemorrhage. No other areas of acute right MCA territory infarction. Additional punctate 5 mm subcortical nonhemorrhagic infarct noted within the left occipital lobe (series 3, image 26). Gray-white matter differentiation otherwise maintained. No other evidence for acute or subacute ischemia. No evidence for acute intracranial hemorrhage. Few scattered punctate chronic micro hemorrhages noted within the brain and posterior fossa, likely hypertensive in nature. No mass lesion, midline shift or mass effect. No hydrocephalus. No extra-axial fluid collection. Major dural  sinuses are grossly patent. No abnormal enhancement. Pituitary gland suprasellar region normal. Vascular: Major intracranial vascular flow voids are maintained. Skull and upper cervical spine: Craniocervical junction within normal limits. Visualized upper cervical spine unremarkable. Bone marrow signal intensity within normal limits. Probable benign hemangioma noted within the C2 vertebral body. No scalp soft tissue abnormality. Sinuses/Orbits: Globes and orbital soft tissues within normal limits. Retention cysts noted within the left maxillary sinus. Paranasal sinuses otherwise largely clear. No mastoid effusion. Inner ear structures normal. Other: Subcentimeter T2 hyperintensity noted within the left parotid gland, of doubtful significance. IMPRESSION: 1. Faint ischemic changes involving the right insula out and basal ganglia as above. No associated hemorrhage or mass effect. 2. Additional punctate 5 mm subcortical left occipital lobe infarct. 3. Remote right thalamic lacunar infarct with additional tiny remote bilateral cerebellar infarcts. 4. Atrophy with moderate chronic microvascular ischemic disease. Electronically Signed   By: Rise Mu M.D.   On: 04/25/2017 14:24   Dg Chest Port 1 View  Result Date: 04/26/2017 CLINICAL DATA:  Respiratory failure EXAM: PORTABLE CHEST 1 VIEW COMPARISON:  04/25/2017 FINDINGS: Endotracheal tube in good position.  NG tube in the stomach. Interval improvement in vascular congestion and mild edema. Progression of left  lower lobe consolidation and effusion. Small right effusion has developed. IMPRESSION: Improvement in mild pulmonary edema. Progression of left lower lobe airspace disease and left effusion. Electronically Signed   By: Marlan Palauharles  Clark M.D.   On: 04/26/2017 07:18   STUDIES:  9/7 Head CT >> ASPECTS 9 9/7 CTA head/neck >> Right M1 occlusion, left CCA occlusion 9/7 cerebral angiogram and thrombectomy >> Right M1 occlusion 9/7 head CT/ CTA runoff right  leg >> occlusion of right SFA, no evidence of hematoma in right groin  CULTURES: None  ANTIBIOTICS: None  SIGNIFICANT EVENTS: 9/6 Admit 9/7 Code stroke 0010, TPA/ IR  LINES/TUBES: 9/7 ETT >> 9/7 left radial art line >>  DISCUSSION: [age] y/oM admitted with acute on chronic systolic HF on 9/6.  Code stroke on 9/7 with left sided weakness and facial drooping, s/p TPA taken to IR for successful embolectomy that developed cooler right leg and absent pulses.    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 P:   Keep foley in place per urology  GASTROINTESTINAL A:   Nutrition. P:   NPO  HEMATOLOGIC A:   No acute issues. P:  F/u CBC  INFECTIOUS A:   No concerns for infection. P:   Monitor clinically  ENDOCRINE A:   DM type II. P:   SSI  NEUROLOGIC A:   Acute R MCA CVA s/p TPA and Right M1 occlusion thrombectomy in IR. P:   RASS goal: 0/-1 Propofol gtt and fentanyl prn  Management and further imaging per Neuro/ Neuro IR   Updated family at bedside  Coralyn HellingVineet Antwann Preziosi, MD [facility] 04/26/2017, 10:44 AM Pager:  [phone] After 3pm call: [phone]

## 2017-04-26 NOTE — Progress Notes (Signed)
ANTICOAGULATION CONSULT NOTE  Pharmacy Consult for heparin Indication: stroke and SFA thrombus  No Known Allergies  Patient Measurements: Height: 6' (182.9 cm) Weight: 195 lb (88.5 kg) IBW/kg (Calculated) : 77.6 Heparin Dosing Weight: 87.7kg  Vital Signs: Temp: 99.8 F (37.7 C) (09/08 2014) Temp Source: Oral (09/08 2014) BP: 109/69 (09/08 2000) Pulse Rate: 95 (09/08 2000)  Labs:  Recent Labs  04/24/17 1000 04/25/17 0100  04/25/17 0959 04/25/17 1226 04/25/17 2316 04/26/17 0530 04/26/17 2043  HGB 13.1  --   < > 11.5*  --  10.5* 10.4*  --   HCT 35.6*  --   < > 32.2*  --  28.5* 28.7*  --   PLT 248  --   --  213  --  194 194  --   APTT  --  34  --   --   --   --   --   --   LABPROT  --  14.5  --   --   --   --   --   --   INR  --  1.14  --   --   --   --   --   --   HEPARINUNFRC  --   --   --   --   --   --   --  0.25*  CREATININE 1.05  --   --  1.05 1.06 0.87 0.87  --   TROPONINI 0.05*  --   --   --   --  0.21* 0.24*  --   < > = values in this interval not displayed.  Estimated Creatinine Clearance: 86.7 mL/min (by C-G formula based on SCr of 0.87 mg/dL).   Medical History: Past Medical History:  Diagnosis Date  . CAD (coronary artery disease) 2007   [place]ABG 2007 [place]  . CHF (congestive heart failure) ([name])   . Diabetes mellitus without complication ([name])   . Hyperlipidemia   . Hypertension   . Pulmonary embolism ([name])   . Stroke [facility]([name])     Medications:  Infusions:  . sodium chloride 100 mL/hr at 04/26/17 1900  . sodium chloride 100 mL/hr at 04/26/17 1259  . sodium chloride    . clevidipine Stopped (04/25/17 1100)  . heparin 1,050 Units/hr (04/26/17 1900)  . magnesium sulfate 1 - 4 g bolus IVPB    . norepinephrine (LEVOPHED) Adult infusion Stopped (04/25/17 2053)  . propofol (DIPRIVAN) infusion 15 mcg/kg/min (04/26/17 1900)    Assessment: 71 yom presented with an acute stroke and subsequent SFA thrombus. Received tPA 9/7 AM and now to start IV  heparin. H/H slightly low but platelets are WNL. He is not on anticoagulation PTA and it has been >24 hours since tPA. CT head today without bleeding.   Heparin level slightly subtherapeutic at 0.25. No bleed or IV line issues per RN.  Goal of Therapy:  Heparin level 0.3-0.5 units/ml Monitor platelets by anticoagulation protocol: Yes   Plan:  Increase heparin gtt to 1150 units/hr - NO BOLUS Check an 8 hr heparin level Daily heparin level and CBC Monitor for s/sx bleeding  Babs BertinHaley Destan Franchini, PharmD, BCPS Clinical Pharmacist 04/26/2017 9:18 PM

## 2017-04-26 NOTE — Progress Notes (Addendum)
Rehab Admissions Coordinator Note:  Patient was screened by Clois DupesBoyette, Noralee Dutko Godwin for appropriateness for an Inpatient Acute Rehab Consult per OT recommendation..  At this time, we are recommending Inpatient Rehab consult.once extubated.  Clois DupesBoyette, Tonetta Napoles Godwin 04/26/2017, 2:25 PM  I can be reached at [phone].

## 2017-04-26 NOTE — Progress Notes (Signed)
STROKE TEAM PROGRESS NOTE   SUBJECTIVE (INTERVAL HISTORY) His daughter is at the bedside. Patient still intubated, however, off sedation, awake, alert, following commands. Plan to extubate today MRI showed right insular cortex and right corona radiata infarcts. Due to small size, okay to start IV heparin.      OBJECTIVE Temp:  [96.7 F (35.9 C)-97.8 F (36.6 C)] 97.8 F (36.6 C) (09/08 0355) Pulse Rate:  [64-90] 79 (09/08 0700) Cardiac Rhythm: Normal sinus rhythm (09/08 0400) Resp:  [12-24] 16 (09/08 0700) BP: (73-109)/(51-87) 98/66 (09/08 0600) SpO2:  [97 %-100 %] 100 % (09/08 0700) Arterial Line BP: (74-125)/(42-73) 103/49 (09/08 0700) FiO2 (%):  [40 %] 40 % (09/08 0351) Weight:  [195 lb (88.5 kg)] 195 lb (88.5 kg) (09/08 0500)  CBC:   Recent Labs Lab 04/25/17 2316 04/26/17 0530  WBC 8.8 8.5  HGB 10.5* 10.4*  HCT 28.5* 28.7*  MCV 69.3* 69.7*  PLT 194 194    Basic Metabolic Panel:   Recent Labs Lab 04/25/17 0959 04/25/17 1226 04/25/17 2316 04/26/17 0530  NA 128* 128* 131* 129*  K 4.8 4.8 4.3 4.2  CL 101 101 105 103  CO2 19* 18* 19* 18*  GLUCOSE 246* 246* 131* 131*  BUN CREATININE 1.05 1.06 0.87 0.87  CALCIUM 8.1* 8.2* 8.3* 8.3*  MG 1.8  --   --   --   PHOS  --  4.4  --   --     Lipid Panel:     Component Value Date/Time   CHOL 92 04/25/2017 2317   TRIG 82 04/25/2017 2317   HDL 35 (L) 04/25/2017 2317   CHOLHDL 2.6 04/25/2017 2317   VLDL 16 04/25/2017 2317   LDLCALC 41 04/25/2017 2317   HgbA1c:  Lab Results  Component Value Date   HGBA1C 8.8 (H) 04/25/2017   Urine Drug Screen: No results found for: LABOPIA, COCAINSCRNUR, LABBENZ, AMPHETMU, THCU, LABBARB  Alcohol Level No results found for: ETH  IMAGING I have personally reviewed the radiological images below and agree with the radiology interpretations.  Ct Head Code Stroke Wo Contrast 04/25/2017 FINDINGS Brain: Small lucency in the right insula with loss of gray-white  differentiation (series 3, image 13). Chronic lacunar infarction within the right superior thalamus. Moderate chronic microvascular ischemic changes of white matter and parenchymal volume loss of the brain. No acute intracranial hemorrhage identified. No hydrocephalus or effacement of basilar cisterns. Vascular: Right M1 hyperdensity. Extensive calcific atherosclerosis of carotid siphons. Skull: Normal. Negative for fracture or focal lesion. Sinuses: Left maxillary sinus mucous retention cyst. Otherwise negative. Orbits: No acute finding. Review of the MIP images confirms the above findings  CT head 04/25/2017 IMPRESSION:  1. Small focus of hypoattenuation within right insula compatible with acute infarct, ASPECTS = 9. 2. Moderate chronic microvascular ischemic changes and moderate parenchymal volume loss of the brain. 3. Small chronic lacunar infarct in right superior thalamus.   Dg Chest 2 View 04/24/2017 IMPRESSION: Cardiac enlargement without heart failure Mild right lower lobe atelectasis.  Dg Chest Port 1 View 04/25/2017 IMPRESSION: Appliances appear in satisfactory location. Cardiac enlargement with mild developing pulmonary vascular congestion and possible early perihilar edema.   Ct Head Wo Contrast 04/25/2017 IMPRESSION: Stable small lucency within the right insula compatible with region of acute infarction. No new area of infarction identified. No acute hemorrhage.   CTA head 04/25/2017 1. Right mid M1 occlusion. Poor downstream right MCA collateralization. 2. Poor enhancement of distal left cavernous and ophthalmic  segments of ICA may be due to slow flow secondary to common carotid occlusion or underlying high-grade stenosis. 3. Intracranial atherosclerosis with multiple areas of mild stenosis in the anterior and posterior circulation. 4. No additional large vessel occlusion, aneurysm, or evidence for vascular malformation.    CTA neck 04/25/2017  1. Left common carotid artery occlusion.  Reconstitution of left internal carotid artery from external carotid artery collaterals.  Poor enhancement of left internal carotid artery indicating slow flow.  2. No significant stenosis of the right carotid system in the neck. 3. Right V1 occlusion with reconstitution of right vertebral artery at the V2 segment. 4. Severe left vertebral artery origin stenosis secondary to calcified plaque. 5. 4.2 cm aortic aneurysm. Recommend annual imaging followup by CTA or MRA. T 6. Interstitial pulmonary edema. Enlarged mediastinal lymph nodes is likely related to pulmonary edema.  Ir Percutaneous Art Thrombectomy/infusion Intracranial Inc Diag Angio 04/25/2017 IMPRESSION: Status post ultrasound guided access right common femoral artery with cerebral angiogram and mechanical thrombectomy for treatment of acute ELVO of right M1, with restoration of TICI 3 flow after 1 pass. Completion angiogram at the right common femoral artery puncture demonstrates proximal occlusion of the superficial femoral artery, and there are absent pedal pulses on Doppler examination at the right foot. Signed, Yvone NeuJaime S. Loreta AveWagner, DO [facility] PLAN: Patient will have a noncontrast head CT performed. Patient will have CT angiogram runoff of the abdomen and lower extremities given the absent pedal pulses. Patient will be admitted to ICU maintained on the ventilator. Sheath has been withdrawn from the right common femoral artery, with failure of suture mediated device. Case has been discussed with vascular surgery. Compression dressing should remain for 6 hours.  Ct Angio Ao+bifem W & Or Wo Contrast 04/25/2017 IMPRESSION: Occlusion of the right SFA just after the origin, with patent stump. There is minimal collateral filling of the distal SFA and popliteal artery via patent profunda femoris. There is also absent contrast flow within the right-sided tibial vasculature to the ankle. On the  left, contrast flow is maintained through the popliteal artery, however, there is decrease contrast within the anterior tibial artery and posterior tibial artery. There appears to be at least partial filling of the peroneal artery to the ankle. This may be related to additional emboli versus baseline vascular disease. Moderate atherosclerotic changes of the bilateral iliac, femoral-popliteal, and tibial vasculature, with calcifications through the length of the right SFA, right popliteal artery, and bilateral tibial arteries. Irregularity of the intimal surface of the right common femoral artery, which is patent with mild narrowing, at the site of recent femoral arterial sheath access. No evidence of hematoma with compression dressing in place.   MRI 04/25/2017 IMPRESSION: 1. Faint ischemic changes involving the right insula out and basal ganglia as above. No associated hemorrhage or mass effect.  2. Additional punctate 5 mm subcortical left occipital lobe infarct. 3. Remote right thalamic lacunar infarct with additional tiny remote bilateral cerebellar infarcts. 4. Atrophy with moderate chronic microvascular ischemic disease.  TTE 04/18/2017 Study Conclusions - Left ventricle: The cavity size was moderately dilated. Wall  thickness was normal. Systolic function was severely reduced. The estimated ejection fraction was in the range of 20% to 25%. - Aortic valve: There was mild regurgitation. - Mitral valve: There was mild regurgitation.    PHYSICAL EXAM Elderly african male intubated and sedated. Afebrile. Head is nontraumatic. Neck is supple without bruit.   Cardiac exam no murmur or gallop.  Lungs are clear to auscultation. Poor pulses in right lower extremity with fasciotomy incision staples and drain Neurological Exam :  Intubated, off sedation. Eyes open, awake, alert, follows simple midline and peripheral commands. Eyes attends to both sides, PERRL, Fundi not visualized. face symmetry not able  to test due to ET tube. Motor system exam shows LUE drift, but 4/5 at least. RUE no drift. BLE 3+/5 bilaterally. DTR 1+, no Babinski. Sensation, coordination not cooperative and gait not tested.  ASSESSMENT/PLAN Mr. Yavier Snider is a 71 y.o. male with history of . French-speaking male from [place] with a history of CAD with MI and CABG in [place] in 2007, CHF with EF of 25%, DM2 (recent A1C 9.4%), HLD, HTN, PE, and stroke who was admitted for CHF and was called as a Code Stroke at 1215 on 04/25/2017 after he developed L hemiparesis and a R-sided facial droop.  Patient was administered IV t-PA at 0130 on 04/25/2017.   Stroke: right MCA insular cortex, BG and CR patchy infarcts, as well as left MCA/PCA punctate infarct, consistent with cardioembolic strokes, likely due to cardiomyopathy  Resultant  Left UE (  CT head: Small lucency in the right insula with loss of gray-white differentiation (series 3, image 13). Chronic lacunar infarction within the right superior thalamus  MRI head: Faint ischemic changes in R insula and basal ganglia.  New punctate 5 mm subcortical L occipital lobe infarct.  Remote R thalamic lacunar infarct with additional tiny remote bilateral cerebellar infarcts.  CTA head/neck: Right mid M1 occlusion. Left CCA occlusion with decreased left ICA flow. Bilateral ICA siphon high-grade stenosis Right V1 occlusion with reconstitution of right VA at the V2 segment.  High-grade L VA stenosis with calcified plaque.  4.2 cm aortic aneurysm.   2D Echo: EF 20-25%. No cardiac embolus  DSA - right MCA TICI 3 reperfusion  LDL 93   HgbA1c 9.4  IV heparin for VTE prophylaxis Diet NPO time specified  aspirin 325 mg daily prior to admission, now on aspirin 81 mg daily. Due to small size of infarcts, agree to start IV heparin for cardiomyopathy and right femoral A. occlusion s/p thrombectomy. Continue aspirin until heparin level therapeutic.  Patient counseled to be compliant with his  antithrombotic medications  Ongoing aggressive stroke risk factor management  Therapy recommendations:  pending  Disposition:  Pending  Right femoral artery occlusion s/p thrombectomy  likely in-situ thrombosis rather than thromboembolism  S/p thrombectomy and endarterectomy and patch angioplasty of the below-the-knee popliteal artery.    Continue RLE drainage   Appreciate VVS assistance  Cardiomyopathy / CHF exacerbation  History of CAD/MI s/p CABG in 2007  EF 20-25%  Admitted for CHF exacerbation  acute ST segment elevation during vascular procedure  Due to small size of infarcts, okay to start IV heparin. Low intensity, no bolus. Continue aspirin until heparin level therapeutic.  Cardiology on board, appreciate assistance  Urinary retention  Nephrology on board  Status post Foley catheter  Continue Foley catheter  Appreciate nephrology assistance  Respiratory failure  Intubated, off sedation  Plan to extubate today  CCM on board  Appreciate assistance  Left common carotid occlusion, likely chronic  CTA neck showed Left CCA occlusion.   Reconstitution of left ICA from external carotid artery collaterals.    slow flow of ICA   Avoid hypotension  Intracranial stenosis  Bilateral ICA siphon high-grade stenosis  Left VA origin high-grade stenosis  Right VA occlusion with reconstitution  Avoid hypotension  Hypotension  Improved  Off pressor  Frequent monitoring now  Long-term BP goal normotensive  Hyperlipidemia  Home meds: atorvastatin 80 mg PO daily, resumed in hospital  LDL 93, goal < 70  Continue statin at discharge  Diabetes  HgbA1c 9.4, goal < 7.0  Uncontrolled  Hyperglycemia  DM Coordinator consulted  SSI  CBG monitoring  Other Stroke Risk Factors  Advanced age  Hx stroke/TIA  CAD/MI s/p CABG  History of PE  Other Active Problems  Hyponatremia  Elevated troponin  Acute blood loss  anemia  Hospital day # 2  This patient is critically ill due to embolic stroke due to right MCA occlusion, status post mechanical thrombectomy, common carotid occlusion on the left, femoral artery occlusion status post thrombectomy, urine retention, cardiomyopathy, hypotension and at significant risk of neurological worsening, death form recurrent stroke, hemorrhagic transformation, femoral artery occlusion, septic shock, heart failure and cardiogenic shock. This patient's care requires constant monitoring of vital signs, hemodynamics, respiratory and cardiac monitoring, review of multiple databases, neurological assessment, discussion with family, other specialists and medical decision making of high complexity. I spent 35 minutes of neurocritical care time in the care of this patient.  Marvel Plan, MD PhD Stroke Neurology 04/26/2017 1:10 PM   To contact Stroke Continuity provider, please refer to [email or web address]. After hours, contact General Neurology

## 2017-04-26 NOTE — Progress Notes (Signed)
VASCULAR LAB PRELIMINARY  ARTERIAL  ABI completed: Right ABI of 0.55 is suggestive of moderate arterial occlusive disease at rest. Left ABI of 0.94 is suggestive of mild arterial occlusive disease at rest. Unable to obtain bilateral TBI's due to low amplitude waveforms.   RIGHT    LEFT    PRESSURE WAVEFORM  PRESSURE WAVEFORM  BRACHIAL IV  BRACHIAL 104 Triphasic  DP 50 Monophasic DP 94 Biphasic  PT 57 Monophasic PT 98 Biphasic    RIGHT LEFT  ABI 0.55 0.94     Devin StainGregory J Mikko Sanchez, RVT 04/26/2017, 3:34 PM

## 2017-04-26 NOTE — Progress Notes (Signed)
ANTICOAGULATION CONSULT NOTE - Initial Consult  Pharmacy Consult for heparin Indication: stroke and SFA thrombus  No Known Allergies  Patient Measurements: Height: 6' (182.9 cm) Weight: 195 lb (88.5 kg) IBW/kg (Calculated) : 77.6 Heparin Dosing Weight: 87.7kg  Vital Signs: Temp: 99 F (37.2 C) (09/08 1131) Temp Source: Oral (09/08 1131) BP: 99/67 (09/08 1200) Pulse Rate: 94 (09/08 1200)  Labs:  Recent Labs  04/24/17 1000 04/25/17 0100  04/25/17 0959 04/25/17 1226 04/25/17 2316 04/26/17 0530  HGB 13.1  --   < > 11.5*  --  10.5* 10.4*  HCT 35.6*  --   < > 32.2*  --  28.5* 28.7*  PLT 248  --   --  213  --  194 194  APTT  --  34  --   --   --   --   --   LABPROT  --  14.5  --   --   --   --   --   INR  --  1.14  --   --   --   --   --   CREATININE 1.05  --   --  1.05 1.06 0.87 0.87  TROPONINI 0.05*  --   --   --   --  0.21* 0.24*  < > = values in this interval not displayed.  Estimated Creatinine Clearance: 86.7 mL/min (by C-G formula based on SCr of 0.87 mg/dL).   Medical History: Past Medical History:  Diagnosis Date  . CAD (coronary artery disease) 2007   CABG 2007 in [place]  . CHF (congestive heart failure) ([name])   . Diabetes mellitus without complication ([name])   . Hyperlipidemia   . Hypertension   . Pulmonary embolism ([name])   . Stroke [facility]([name])     Medications:  Infusions:  . sodium chloride 100 mL/hr at 04/26/17 0218  . sodium chloride    . sodium chloride    . clevidipine Stopped (04/25/17 1100)  . heparin    . magnesium sulfate 1 - 4 g bolus IVPB    . norepinephrine (LEVOPHED) Adult infusion Stopped (04/25/17 2053)  . propofol (DIPRIVAN) infusion 10 mcg/kg/min (04/26/17 1100)    Assessment: [name] presented with an acute stroke and subsequent SFA thrombus. Received tPA 9/7 AM and now to start IV heparin. H/H slightly low but platelets are WNL. He is not on anticoagulation PTA and it has been >24 hours since tPA. CT head today without bleeding.    Goal of Therapy:  Heparin level 0.3-0.5 units/ml Monitor platelets by anticoagulation protocol: Yes   Plan:  Heparin gtt 1050 units/hr - NO BOLUS Check an 8 hr heparin level Daily heparin level and CBC  Varetta Chavers, Drake Leachachel Lynn 04/26/2017,12:39 PM

## 2017-04-26 NOTE — Progress Notes (Signed)
[facility] [place]  Subjective  - awake on vent following commands   Objective 92/67 86 97.9 F (36.6 C) (Oral) 19 99%  Intake/Output Summary (Last 24 hours) at 04/26/17 1025 Last data filed at 04/26/17 1000  Gross per 24 hour  Intake          2794.68 ml  Output             1040 ml  Net          1754.68 ml   Right and left foot DP doppler signals fairly symmetric Left below knee incision intact JP 60 cc over 24 hr  Assessment/Planning: Viable feet bilaterally family updated at bedside Ok to anticoagulate from our standpoint Most likely drain out tomorrow  Fabienne BrunsFields, Charles 04/26/2017 10:25 AM --  Laboratory Lab Results:  Recent Labs  04/25/17 2316 04/26/17 0530  WBC 8.8 8.5  HGB 10.5* 10.4*  HCT 28.5* 28.7*  PLT 194 194   BMET  Recent Labs  04/25/17 2316 04/26/17 0530  NA 131* 129*  K 4.3 4.2  CL 105 103  CO2 19* 18*  GLUCOSE 131* 131*  BUN 15 14  CREATININE 0.87 0.87  CALCIUM 8.3* 8.3*    COAG Lab Results  Component Value Date   INR 1.14 04/25/2017   No results found for: PTT

## 2017-04-26 NOTE — Evaluation (Signed)
Physical Therapy Evaluation Patient Details Name: [name] MRN: [id number] DOB: Sep 29, 1945 Today's Date: 04/26/2017   History of Present Illness  71 y.o.malewith a history of MI and CABG 2007, DM type 2, HTN, stroke and HLD who presented for SOB possible CHF exacerbation. During hosptial course developed facial droop, Lt side weakness. Code stroke called, imaging CTA + Right MCA M1 embolic occlusion. Patient taken to IR and received TPA.  Clinical Impression  Orders received for PT evaluation. Patient demonstrates deficits in functional mobility as indicated below. Will benefit from continued skilled PT to address deficits and maximize function. Will see as indicated and progress as tolerated.  Prior to admission, patient independent with all aspects of mobility and self care. At this time, patient demonstrating need for increased physical assist and LLE deficits impacting functional and mobility. Given deficits, would anticipate need for comprehensive inpatient rehabilitation following acute discharge. Patient has family present at bedside during session and is please with current progress.   OF NOTE: VSS throughout session while on ET Ventilation.     Follow Up Recommendations CIR;Supervision/Assistance - 24 hour    Equipment Recommendations   (tbd)    Recommendations for Other Services Rehab consult     Precautions / Restrictions Precautions Precautions: Other (comment);Fall (vent) Precaution Comments: JP drain RLE, ET ventilation, Art line in place Restrictions Weight Bearing Restrictions: No      Mobility  Bed Mobility Overal bed mobility: Needs Assistance Bed Mobility: Supine to Sit;Sit to Supine     Supine to sit: Mod assist;+2 for physical assistance;HOB elevated Sit to supine: Mod assist;+2 for physical assistance;HOB elevated   General bed mobility comments: Pt able to move from supine to sit with mod assist of PT/OT for management of vent, lines and leads.  Patient was able to move bilateral LEs toward EOB, increased physical assist to elevate trunk to upright, patient with posterior bias upon coming to EOB. Patient able to initiate return to supine, assist to control trunk and elevate LEs back to bed.   Transfers                    Ambulation/Gait                Stairs            Wheelchair Mobility    Modified Rankin (Stroke Patients Only) Modified Rankin (Stroke Patients Only) Pre-Morbid Rankin Score: No symptoms Modified Rankin: Moderately severe disability     Balance Overall balance assessment: Needs assistance Sitting-balance support: Feet unsupported;Bilateral upper extremity supported Sitting balance-Leahy Scale: Poor Sitting balance - Comments: patient with posterior bias, min to moderate assist at EOB. (tolerate ~ 10 min EOB) Postural control: Posterior lean                                   Pertinent Vitals/Pain Pain Assessment: Faces Faces Pain Scale: Hurts a little bit Pain Location: irritation with Trach Pain Intervention(s): Limited activity within patient's tolerance;Repositioned    Home Living Family/patient expects to be discharged to:: Private residence Living Arrangements: Children Available Help at Discharge: Family;Available 24 hours/day Type of Home: House Home Access: Stairs to enter   Enter[employer] CorporationEntrance Stairs-Number of Steps: 4 Home Layout: One level Home Equipment: Cane - single point      Prior Function Level of Independence: Independent with assistive device(s)         Comments: used SPC for ambulation  Hand Dominance        Extremity/Trunk Assessment   Upper Extremity Assessment Upper Extremity Assessment: Defer to OT evaluation    Lower Extremity Assessment Lower Extremity Assessment: Generalized weakness;Difficult to assess due to impaired cognition (LLE deficits noted, <3/5 gross motions, will further assess)       Communication    Communication: Prefers language other than [place];Other (comment) (speaks [place], daughter translated this session, on the vent)  Cognition Arousal/Alertness: Awake/alert Behavior During Therapy: WFL for tasks assessed/performed;Restless Overall Cognitive Status: Impaired/Different from baseline Area of Impairment: Attention;Following commands;Problem solving                   Current Attention Level: Sustained   Following Commands: Follows one step commands consistently;Follows one step commands with increased time     Problem Solving: Requires verbal cues;Requires tactile cues General Comments: on the vent, following simple directions during session      General Comments      Exercises     Assessment/Plan    PT Assessment Patient needs continued PT services  PT Problem List Decreased strength;Decreased activity tolerance;Decreased balance;Decreased mobility;Cardiopulmonary status limiting activity       PT Treatment Interventions DME instruction;Gait training;Stair training;Functional mobility training;Therapeutic activities;Therapeutic exercise;Balance training;Neuromuscular re-education;Cognitive remediation;Patient/family education    PT Goals (Current goals can be found in the Care Plan section)  Acute Rehab PT Goals Patient Stated Goal: none stated PT Goal Formulation: With family Time For Goal Achievement: 05/10/17 Potential to Achieve Goals: Good    Frequency Min 4X/week   Barriers to discharge        Co-evaluation PT/OT/SLP Co-Evaluation/Treatment: Yes Reason for Co-Treatment: Complexity of the patient's impairments (multi-system involvement);Necessary to address cognition/behavior during functional activity;For patient/therapist safety PT goals addressed during session: Mobility/safety with mobility;Balance;Strengthening/ROM OT goals addressed during session: ADL's and self-care       AM-PAC PT "6 Clicks" Daily Activity  Outcome Measure  Difficulty turning over in bed (including adjusting bedclothes, sheets and blankets)?: Unable Difficulty moving from lying on back to sitting on the side of the bed? : Unable Difficulty sitting down on and standing up from a chair with arms (e.g., wheelchair, bedside commode, etc,.)?: Unable Help needed moving to and from a bed to chair (including a wheelchair)?: A Lot Help needed walking in hospital room?: A Lot Help needed climbing 3-5 steps with a railing? : A Lot 6 Click Score: 9    End of Session Equipment Utilized During Treatment: Oxygen (via ET ventilation) Activity Tolerance: Patient tolerated treatment well Patient left: in bed;with call bell/phone within reach;with bed alarm set;with family/visitor present;with SCD's reapplied Nurse Communication: Mobility status PT Visit Diagnosis: Difficulty in walking, not elsewhere classified (R26.2);Other symptoms and signs involving the nervous system (R29.898)    Time: 2130-8657 PT Time Calculation (min) (ACUTE ONLY): 23 min   Charges:   PT Evaluation $PT Eval Moderate Complexity: 1 Mod     PT G Codes:        Charlotte Crumb, PT DPT  Board Certified Neurologic Specialist [phone]   Fabio Asa 04/26/2017, 10:44 AM

## 2017-04-26 NOTE — Progress Notes (Signed)
  Echocardiogram 2D Echocardiogram has been performed.  [name] 04/26/2017, 9:18 AM

## 2017-04-27 ENCOUNTER — Inpatient Hospital Stay (HOSPITAL_COMMUNITY): Payer: Medicaid Other

## 2017-04-27 DIAGNOSIS — J9601 Acute respiratory failure with hypoxia: Secondary | ICD-10-CM

## 2017-04-27 LAB — CBC
HEMATOCRIT: 26.9 % — AB (ref 39.0–52.0)
HEMOGLOBIN: 9.6 g/dL — AB (ref 13.0–17.0)
MCH: 24.9 pg — ABNORMAL LOW (ref 26.0–34.0)
MCHC: 35.7 g/dL (ref 30.0–36.0)
MCV: 69.9 fL — ABNORMAL LOW (ref 78.0–100.0)
Platelets: 166 10*3/uL (ref 150–400)
RBC: 3.85 MIL/uL — AB (ref 4.22–5.81)
RDW: 14.3 % (ref 11.5–15.5)
WBC: 12.2 10*3/uL — AB (ref 4.0–10.5)

## 2017-04-27 LAB — BASIC METABOLIC PANEL
ANION GAP: 9 (ref 5–15)
BUN: 12 mg/dL (ref 6–20)
CO2: 16 mmol/L — ABNORMAL LOW (ref 22–32)
Calcium: 8 mg/dL — ABNORMAL LOW (ref 8.9–10.3)
Chloride: 104 mmol/L (ref 101–111)
Creatinine, Ser: 0.9 mg/dL (ref 0.61–1.24)
GLUCOSE: 136 mg/dL — AB (ref 65–99)
POTASSIUM: 3.9 mmol/L (ref 3.5–5.1)
Sodium: 129 mmol/L — ABNORMAL LOW (ref 135–145)

## 2017-04-27 LAB — HEPARIN LEVEL (UNFRACTIONATED)
HEPARIN UNFRACTIONATED: 0.38 [IU]/mL (ref 0.30–0.70)
Heparin Unfractionated: 0.31 IU/mL (ref 0.30–0.70)

## 2017-04-27 LAB — GLUCOSE, CAPILLARY
GLUCOSE-CAPILLARY: 137 mg/dL — AB (ref 65–99)
GLUCOSE-CAPILLARY: 140 mg/dL — AB (ref 65–99)
GLUCOSE-CAPILLARY: 142 mg/dL — AB (ref 65–99)
GLUCOSE-CAPILLARY: 149 mg/dL — AB (ref 65–99)
GLUCOSE-CAPILLARY: 161 mg/dL — AB (ref 65–99)
GLUCOSE-CAPILLARY: 170 mg/dL — AB (ref 65–99)
Glucose-Capillary: 114 mg/dL — ABNORMAL HIGH (ref 65–99)
Glucose-Capillary: 122 mg/dL — ABNORMAL HIGH (ref 65–99)

## 2017-04-27 MED ORDER — FUROSEMIDE 10 MG/ML IJ SOLN
40.0000 mg | Freq: Once | INTRAMUSCULAR | Status: AC
  Administered 2017-04-27: 40 mg via INTRAVENOUS
  Filled 2017-04-27: qty 4

## 2017-04-27 MED ORDER — ORAL CARE MOUTH RINSE
15.0000 mL | Freq: Two times a day (BID) | OROMUCOSAL | Status: DC
  Administered 2017-04-28 – 2017-05-05 (×11): 15 mL via OROMUCOSAL

## 2017-04-27 MED ORDER — SODIUM CHLORIDE 0.9 % IV SOLN
INTRAVENOUS | Status: DC
  Administered 2017-04-27: 13:00:00 via INTRAVENOUS

## 2017-04-27 NOTE — Procedures (Signed)
Extubation Procedure Note  Patient Details:   Name: [name] DOB: [birth year] MRN: [id number]   Airway Documentation:     Evaluation  O2 sats: stable throughout Complications: No apparent complications Patient did tolerate procedure well. Bilateral Breath Sounds: Clear, Diminished   Yes   Positive cuff leak noted.  Pt placed on [place] 3 L with humidity, no stridor noted, able to reach 500 using incentive spirometer.  Tolerating well at this time.  Forest BeckerJean S Madalina Rosman 04/27/2017, 9:26 AM

## 2017-04-27 NOTE — Progress Notes (Signed)
ANTICOAGULATION CONSULT NOTE - Follow Up Consult  Pharmacy Consult for heparin Indication: stroke and SFA thrombus  Labs:  Recent Labs  04/24/17 1000 04/25/17 0100  04/25/17 1226 04/25/17 2316 04/26/17 0530 04/26/17 2043 04/27/17 0312  HGB 13.1  --   < >  --  10.5* 10.4*  --  9.6*  HCT 35.6*  --   < >  --  28.5* 28.7*  --  26.9*  PLT 248  --   < >  --  194 194  --  166  APTT  --  34  --   --   --   --   --   --   LABPROT  --  14.5  --   --   --   --   --   --   INR  --  1.14  --   --   --   --   --   --   HEPARINUNFRC  --   --   --   --   --   --  0.25* 0.31  CREATININE 1.05  --   < > 1.06 0.87 0.87  --   --   TROPONINI 0.05*  --   --   --  0.21* 0.24*  --   --   < > = values in this interval not displayed.   Assessment/Plan:  71yo male therapeutic on heparin after rate change. Will continue gtt at current rate and confirm stable with additional level.   Vernard GamblesVeronda Carley Glendenning, PharmD, BCPS  04/27/2017,3:42 AM

## 2017-04-27 NOTE — Progress Notes (Addendum)
Progress Note  Patient Name: [name] Date of Encounter: 04/27/2017  Primary Cardiologist: Stanford Breed  Subjective   Intubated. Daughter Gay Filler in room. No complaints. Sedate.    Inpatient Medications    Scheduled Meds: . aspirin EC  81 mg Oral Daily  . atorvastatin  80 mg Per Tube q1800  . chlorhexidine gluconate (MEDLINE KIT)  15 mL Mouth Rinse BID  . docusate sodium  100 mg Oral Daily  . insulin aspart  0-9 Units Subcutaneous Q4H  . mouth rinse  15 mL Mouth Rinse QID  . pantoprazole (PROTONIX) IV  40 mg Intravenous Daily   Continuous Infusions: . sodium chloride 100 mL/hr at 04/27/17 0400  . sodium chloride 100 mL/hr at 04/26/17 1259  . sodium chloride    . clevidipine Stopped (04/25/17 1100)  . heparin 1,150 Units/hr (04/26/17 2123)  . magnesium sulfate 1 - 4 g bolus IVPB    . norepinephrine (LEVOPHED) Adult infusion Stopped (04/25/17 2053)  . propofol (DIPRIVAN) infusion 30 mcg/kg/min (04/27/17 0446)   PRN Meds: acetaminophen, bisacodyl, docusate, fentaNYL (SUBLIMAZE) injection, guaiFENesin-dextromethorphan, magnesium sulfate 1 - 4 g bolus IVPB, morphine injection, ondansetron (ZOFRAN) IV, phenol, potassium chloride   Vital Signs    Vitals:   04/27/17 0400 04/27/17 0446 04/27/17 0500 04/27/17 0600  BP: 93/64   92/66  Pulse: 81  80 81  Resp: 20  (!) 22 19  Temp:      TempSrc:      SpO2: 99%  100% 99%  Weight:  204 lb 3.2 oz (92.6 kg)    Height:        Intake/Output Summary (Last 24 hours) at 04/27/17 0834 Last data filed at 04/27/17 0600  Gross per 24 hour  Intake          2663.95 ml  Output              595 ml  Net          2068.95 ml   Filed Weights   04/25/17 0515 04/26/17 0500 04/27/17 0446  Weight: 193 lb 4.8 oz (87.7 kg) 195 lb (88.5 kg) 204 lb 3.2 oz (92.6 kg)    Telemetry    Normal sinus rhythm, no changes - Personally Reviewed  ECG    EKG from operating room reviewed and compared to prior EKG, there are T-wave inversions noted in the  lateral precordial leads as a result of his cardiomyopathy, no significant changes, J-point elevation noted in the early precordial leads similar to prior EKG- Personally Reviewed  Physical Exam   GEN: Well nourished, well developed, on vent  HEENT: ETT  Neck: no JVD, carotid bruits, or masses Cardiac:RRR; no murmurs, rubs, or gallops,  Trace lower extremity edema  Respiratory:  No significant crackles, vent noise noted GI: soft, nontender, nondistended, + BS MS: no deformity or atrophy  Skin: warm right foot Neuro:  Mildly sedated, I saw him move his lower extremities Psych: Unable  Labs    Chemistry  Recent Labs Lab 04/25/17 1226 04/25/17 2316 04/26/17 0530 04/27/17 0312  NA 128* 131* 129* 129*  K 4.8 4.3 4.2 3.9  CL 101 105 103 104  CO2 18* 19* 18* 16*  GLUCOSE 246* 131* 131* 136*  BUN _0 CREATININE 1.06 0.87 0.87 0.90  CALCIUM 8.2* 8.3* 8.3* 8.0*  PROT  --  5.4*  --   --   ALBUMIN 3.2* 3.0*  --   --   AST  --  17  --   --  ALT  --  15*  --   --   ALKPHOS  --  50  --   --   BILITOT  --  0.4  --   --   GFRNONAA >60 >60 >60 >60  GFRAA >60 >60 >60 >60  ANIONGAP _0 Hematology  Recent Labs Lab 04/25/17 2316 04/26/17 0530 04/27/17 0312  WBC 8.8 8.5 12.2*  RBC 4.11* 4.12* 3.85*  HGB 10.5* 10.4* 9.6*  HCT 28.5* 28.7* 26.9*  MCV 69.3* 69.7* 69.9*  MCH 25.5* 25.2* 24.9*  MCHC 36.8* 36.2* 35.7  RDW 14.2 14.2 14.3  PLT 194 194 166    Cardiac Enzymes  Recent Labs Lab 04/24/17 1000 04/25/17 2316 04/26/17 0530  TROPONINI 0.05* 0.21* 0.24*     Recent Labs Lab 04/24/17 1012  TROPIPOC 0.11*     BNP  Recent Labs Lab 04/24/17 1000  BNP 832.8*     DDimer   Recent Labs Lab 04/24/17 1000  DDIMER 0.92*     Radiology    Ct Head Wo Contrast  Result Date: 04/26/2017 CLINICAL DATA:  One day follow-up status post tPA. Status post mechanical thrombectomy RIGHT M1 occlusion. EXAM: CT HEAD WITHOUT CONTRAST TECHNIQUE: Contiguous  axial images were obtained from the base of the skull through the vertex without intravenous contrast. COMPARISON:  MRI of the head April 25, 2017 and CT HEAD April 25, 2017 FINDINGS: BRAIN: No intraparenchymal hemorrhage, mass effect nor midline shift. The ventricles and sulci are normal for age. Patchy supratentorial white matter hypodensities. Old RIGHT thalamus lacunar infarct. No acute large vascular territory infarcts. No abnormal extra-axial fluid collections. Basal cisterns are patent. VASCULAR: Severe calcific atherosclerosis of the carotid siphons. SKULL: No skull fracture. No significant scalp soft tissue swelling. SINUSES/ORBITS: Mild paranasal sinus mucosal thickening. LEFT maxillary mucosal retention cyst.The included ocular globes and orbital contents are non-suspicious. OTHER: Life-support lines in place. IMPRESSION: 1. No acute intracranial process. Known acute RIGHT MCA territory infarcts not apparent by CT. 2. 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 Moderate chronic small vessel ischemic disease. Electronically Signed   By: Elon Alas M.D.   On: 04/26/2017 01:56   Mr Jeri Cos YI Contrast  Result Date: 04/25/2017 CLINICAL DATA:  Follow-up examination for stroke, status post transarterial intervention for right M1 EVLO. EXAM: MRI HEAD WITHOUT AND WITH CONTRAST TECHNIQUE: Multiplanar, multiecho pulse sequences of the brain and surrounding structures were obtained without and with intravenous contrast. CONTRAST:  31m MULTIHANCE GADOBENATE DIMEGLUMINE 529 MG/ML IV SOLN COMPARISON:  Prior CT from earlier the same day as well as previous studies. FINDINGS: Brain: Generalized age-related cerebral atrophy. Patchy and confluent T2/FLAIR hyperintensity within the periventricular and deep white matter both cerebral hemispheres most consistent with chronic microvascular disease, moderate nature. Remote lacunar infarct with associated chronic hemorrhagic blood products present within the right thalamus. Few  additional tiny remote bilateral cerebellar infarcts noted. Mild diffusion abnormality present within the posterior right lentiform nucleus extending superiorly into the right caudate (series 4, image 18). Additional faint diffusion abnormality within the right insula, most notable inferiorly (series 3, image 27) findings consistent with acute ischemia. No associated mass effect or hemorrhage. No other areas of acute right MCA territory infarction. Additional punctate 5 mm subcortical nonhemorrhagic infarct noted within the left occipital lobe (series 3, image 26). Gray-white matter differentiation otherwise maintained. No other evidence for acute or subacute ischemia. No evidence for acute intracranial hemorrhage. Few scattered punctate chronic micro hemorrhages noted within the brain and  posterior fossa, likely hypertensive in nature. No mass lesion, midline shift or mass effect. No hydrocephalus. No extra-axial fluid collection. Major dural sinuses are grossly patent. No abnormal enhancement. Pituitary gland suprasellar region normal. Vascular: Major intracranial vascular flow voids are maintained. Skull and upper cervical spine: Craniocervical junction within normal limits. Visualized upper cervical spine unremarkable. Bone marrow signal intensity within normal limits. Probable benign hemangioma noted within the C2 vertebral body. No scalp soft tissue abnormality. Sinuses/Orbits: Globes and orbital soft tissues within normal limits. Retention cysts noted within the left maxillary sinus. Paranasal sinuses otherwise largely clear. No mastoid effusion. Inner ear structures normal. Other: Subcentimeter T2 hyperintensity noted within the left parotid gland, of doubtful significance. IMPRESSION: 1. Faint ischemic changes involving the right insula out and basal ganglia as above. No associated hemorrhage or mass effect. 2. Additional punctate 5 mm subcortical left occipital lobe infarct. 3. Remote right thalamic  lacunar infarct with additional tiny remote bilateral cerebellar infarcts. 4. Atrophy with moderate chronic microvascular ischemic disease. Electronically Signed   By: Jeannine Boga M.D.   On: 04/25/2017 14:24   Dg Chest Port 1 View  Result Date: 04/27/2017 CLINICAL DATA:  Respiratory failure. EXAM: PORTABLE CHEST 1 VIEW COMPARISON:  Radiograph of April 26, 2017. FINDINGS: Stable cardiomegaly. Stable position of endotracheal and nasogastric tubes. No pneumothorax is noted. Increased bibasilar opacities are noted concerning for worsening edema or atelectasis with associated pleural effusions. Bony thorax is unremarkable. IMPRESSION: Stable support apparatus. Increased bibasilar edema or atelectasis is noted with associated pleural effusions. Electronically Signed   By: Marijo Conception, M.D.   On: 04/27/2017 07:54   Dg Chest Port 1 View  Result Date: 04/26/2017 CLINICAL DATA:  Respiratory failure EXAM: PORTABLE CHEST 1 VIEW COMPARISON:  04/25/2017 FINDINGS: Endotracheal tube in good position.  NG tube in the stomach. Interval improvement in vascular congestion and mild edema. Progression of left lower lobe consolidation and effusion. Small right effusion has developed. IMPRESSION: Improvement in mild pulmonary edema. Progression of left lower lobe airspace disease and left effusion. Electronically Signed   By: Franchot Gallo M.D.   On: 04/26/2017 07:18    Cardiac Studies   Personally reviewed echocardiogram from last week-no thrombus in left ventricle. EF 20-25%.  Limited echocardiogram on 04/26/17  - No evidence of LV thrombus with Definity contrast  - EF 20-25%  Patient Profile     71 y.o. male  readmitted with acute on chronic systolic heart failure who suffered an acute in-hospital right MCA stroke, presumed cardioembolic status post thrombectomy of MCA by interventional radiology with subsequent superior femoral artery occlusion status post thrombectomy by Dr. Bridgett Larsson of vascular surgery,  ejection fraction 25%, with no LV thrombus noted with Definity contrast  Assessment & Plan    Acute on chronic systolic heart failure   - he received IV Lasix on admission and in the emergency room and showed improvement clinically with less shortness of breath and decreased orthopnea prior to his stroke.  - He has had mild increase in left pleural effusion noted on chest x-ray but overall pulmonary edema is improved on ventilator.  - His blood pressures have remained fairly soft on sedation, in the upper 80s, upper 74J systolic.  - For now, we will continue to hold his IV Lasix administration, however we will have a low threshold for reinitiation if any signs of poor oxygenation or worsening pulmonary edema develops. If critical care team feels that more Lasix as needed, please go right ahead.  -  We were holding his carvedilol 3.125 and his losartan 25 mg because of recent hypotension in the setting of acute stroke.  - After extubation, we will gently add back these medications once again.  - Stopped potassium  - Trying to allow for higher blood pressure to allow for improved perfusion post stroke  Acute right MCA stroke  - Likely cardioembolic source despite Definity contrast showing no evidence of current thrombo-embolic source in the left ventricle. Certainly with his markedly reduced ejection fraction he is at risk for thrombus development.  - Interventional radiology/neurology-appreciate teamwork   - ASA  - Now on IV heparin. CT repeat shows no hem, no effects of CVA  - Will ultimately need transition to warfarin.   Acute right superficial femoral artery thrombus  - Status post thrombectomy, Dr. Bridgett Larsson. The distal popliteal artery and bifurcation into the anterior tibial artery and tibioperoneal trunk was essentially chronically occluded. He was able to retrieve subacute thrombus from the femoral-popliteal artery.  - He now has a warm right foot with improved perfusion  - During surgery  there was concern about ST segment changes, I reviewed his ECGs surrounding the surgery and albeit abnormal ST segments and T waves, there is no significant change from prior. These ST segment changes and T-wave changes are indicative of his underlying cardiomyopathy.   Acute respiratory failure  - Currently on ventilator for support in the setting of acute stroke. Hopefully, he will have extubation soon. Once again, if IV Lasix as needed this is fine.  - Management per critical care team   Hyponatremia  - Likely from underlying cardiomyopathy, decreased cardiac output  - This is a prognostic indicator for worsening heart failure.  Critical care time 35 minutes spent with extensive data review, discussion with daughter,in this gentleman with, recent stroke, dilated cardiomyopathy, femoral artery thrombectomy.    Signed, Candee Furbish, MD  04/27/2017, 8:34 AM

## 2017-04-27 NOTE — Progress Notes (Signed)
PULMONARY / CRITICAL CARE MEDICINE   Name: Erroll Wilbourne MRN: [id number] DOB: [birth year]    ADMISSION DATE:  04/24/2017 CONSULTATION DATE:  04/25/17  REFERRING MD:  Dr. Loreta Ave  CHIEF COMPLAINT:  Vent management  HISTORY OF PRESENT ILLNESS:   71 yo male developed shortness of breath, facial droop, Lt side weakness.  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  Given tPA, intubated for airway protection, and had thrombectomy of Rt M1.  Developed Rt leg ischemia after procedure likely from embolic event.  PMHx of CAD, ischemic CM, HTN, DM, HLD, CVA.  SUBJECTIVE:  Doing well with pressure support.  VITAL SIGNS: BP 92/66   Pulse 81   Temp 99.8 F (37.7 C) (Oral)   Resp 19   Ht 6' (1.829 m)   Wt 204 lb 3.2 oz (92.6 kg)   SpO2 99%   BMI 27.69 kg/m   VENTILATOR SETTINGS: Vent Mode: PRVC FiO2 (%):  [40 %] 40 % Set Rate:  [12 bmp] 12 bmp Vt Set:  [550 mL] 550 mL PEEP:  [5 cmH20] 5 cmH20 Pressure Support:  [5 cmH20] 5 cmH20 Plateau Pressure:  [18 cmH20-22 cmH20] 18 cmH20  INTAKE / OUTPUT: I/O last 3 completed shifts: In: 4258.3 [I.V.:4158.3; IV Piggyback:100] Out: 1015 [Urine:975; Drains:40]  PHYSICAL EXAMINATION:  General - alert Eyes - pupils reactive ENT - ETT in place Cardiac - regular, no murmur Chest - scattered rhonchi Abd - soft, non tender Ext - 1+ edema Skin - no rashes Neuro - follows commands  LABS:  BMET  Recent Labs Lab 04/25/17 2316 04/26/17 0530 04/27/17 0312  NA 131* 129* 129*  K 4.3 4.2 3.9  CL 105 103 104  CO2 19* 18* 16*  BUN CREATININE 0.87 0.87 0.90  GLUCOSE 131* 131* 136*    Electrolytes  Recent Labs Lab 04/25/17 0959 04/25/17 1226 04/25/17 2316 04/26/17 0530 04/27/17 0312  CALCIUM 8.1* 8.2* 8.3* 8.3* 8.0*  MG 1.8  --   --   --   --   PHOS  --  4.4  --   --   --     CBC  Recent Labs Lab 04/25/17 2316 04/26/17 0530 04/27/17 0312  WBC 8.8 8.5 12.2*  HGB 10.5* 10.4* 9.6*  HCT 28.5*  28.7* 26.9*  PLT 194 194 166    Coag's  Recent Labs Lab 04/25/17 0100  APTT 34  INR 1.14    Sepsis Markers No results for input(s): LATICACIDVEN, PROCALCITON, O2SATVEN in the last 168 hours.  ABG  Recent Labs Lab 04/25/17 0510 04/25/17 0719  PHART 7.387 7.351  PCO2ART 33.8 33.9  PO2ART 146* 70.0*    Liver Enzymes  Recent Labs Lab 04/25/17 1226 04/25/17 2316  AST  --  17  ALT  --  15*  ALKPHOS  --  50  BILITOT  --  0.4  ALBUMIN 3.2* 3.0*    Cardiac Enzymes  Recent Labs Lab 04/24/17 1000 04/25/17 2316 04/26/17 0530  TROPONINI 0.05* 0.21* 0.24*    Glucose  Recent Labs Lab 04/26/17 1132 04/26/17 1539 04/26/17 1957 04/26/17 2352 04/27/17 0323 04/27/17 0805  GLUCAP 158* 147* 140* 142* 137* 122*    Imaging Dg Chest Port 1 View  Result Date: 04/27/2017 CLINICAL DATA:  Respiratory failure. EXAM: PORTABLE CHEST 1 VIEW COMPARISON:  Radiograph of April 26, 2017. FINDINGS: Stable cardiomegaly. Stable position of endotracheal and nasogastric tubes. No pneumothorax is noted. Increased bibasilar opacities are noted  concerning for worsening edema or atelectasis with associated pleural effusions. Bony thorax is unremarkable. IMPRESSION: Stable support apparatus. Increased bibasilar edema or atelectasis is noted with associated pleural effusions. Electronically Signed   By: Lupita RaiderJames  Green Jr, M.D.   On: 04/27/2017 07:54   STUDIES:  9/7 Head CT >> ASPECTS 9 9/7 CTA head/neck >> Right M1 occlusion, left CCA occlusion 9/7 cerebral angiogram and thrombectomy >> Right M1 occlusion 9/7 head CT/ CTA runoff right leg >> occlusion of right SFA, no evidence of hematoma in right groin  CULTURES: None  ANTIBIOTICS: None  SIGNIFICANT EVENTS: 9/6 Admit 9/7 Code stroke 0010, TPA/ IR  LINES/TUBES: 9/7 ETT >> 9/7 left radial art line >>  DISCUSSION: 89 y/oM admitted with acute on chronic systolic HF on 9/6.  Code stroke on 9/7 with left sided weakness and facial  drooping, s/p TPA taken to IR for successful embolectomy that developed cooler right leg and absent pulses.    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 Bladder neck contracture. P:   Lasix F/u BMET Keep foley in per urology  GASTROINTESTINAL A:   Nutrition. Dysphagia. P:   Will need speech assessment after extubation  HEMATOLOGIC A:   Anemia of critical illness. P:  F/u CBC  INFECTIOUS A:   No concerns for infection. P:   Monitor clinically  ENDOCRINE A:   DM type II. P:   SSI  NEUROLOGIC A:   Acute R MCA CVA s/p TPA and Right M1 occlusion thrombectomy in IR. P:   Per neurology  Updated family at bedside  CC time 31 minutes  Coralyn HellingVineet Eian Vandervelden, MD [facility] 04/27/2017, 9:02 AM Pager:  [phone] After 3pm call: [phone]

## 2017-04-27 NOTE — Evaluation (Signed)
Clinical/Bedside Swallow Evaluation Patient Details  Name: [name] MRN: [id number] Date of Birth: 07/12/1946  Today's Date: 04/27/2017 Time: SLP Start Time (ACUTE ONLY): 1512 SLP Stop Time (ACUTE ONLY): 1530 SLP Time Calculation (min) (ACUTE ONLY): 18 min  Past Medical History:  Past Medical History:  Diagnosis Date  . CAD (coronary artery disease) 2007   [place] 2007 [place]  . CHF (congestive heart failure) ([name])   . Diabetes mellitus without complication ([name])   . Hyperlipidemia   . Hypertension   . Pulmonary embolism ([name])   . Stroke [facility]([name])    Past Surgical History:  Past Surgical History:  Procedure Laterality Date  . CARDIAC SURGERY    . IR PERCUTANEOUS ART THROMBECTOMY/INFUSION INTRACRANIAL INC DIAG ANGIO  04/25/2017  . PROSTATE SURGERY  2002  . RADIOLOGY WITH ANESTHESIA N/A 04/24/2017   Procedure: RADIOLOGY WITH ANESTHESIA Code Stroke;  Surgeon: Gilmer MorWagner, Jaime, DO;  Location: [facility] OR;  Service: Anesthesiology;  Laterality: N/A;   HPI:  71 yo male developed shortness of breath, facial droop, Lt side weakness. 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 Given tPA, intubated for airway protection, and had thrombectomy of Rt M1. Developed Rt leg ischemia after procedure likely from embolic event. PMHx of CAD, ischemic CM, HTN, DM, HLD, CVA. Intubated 9/7-9/9.   Assessment / Plan / Recommendation Clinical Impression  Patient presents with moderate-severe risk for aspiration s/p CVA and 3 day intubation. Respiratory rate fluctuating between upper 20s to upper 30s at baseline. Pt's voice is moderately reduced in intensity, cough is strong and oral motor examination is otherwise unremarkable. After performing oral care, SLP provided trials of ice chips, single sips of water during periods of lower respirations. RN requesting means of administering oral medications; assessed pills one at at time in 1/2 teaspoon puree. Pt with no changes in vital signs, voice  remains clear; airway protection appears adequate. Given pt's elevated respirations, recommend he remain NPO with the exception of ice chips, single sips of water after oral care, necessary medications one at at time in puree when respirations below 30. SLP will follow up next date for diet intiation and/or instrumental assessment if necessary. Anticipate he will be able to begin an oral diet tomorrow with improvements in respiratory status. Pt may benefit from cognitive-linguistic assessment given CVA.  SLP Visit Diagnosis: Dysphagia, unspecified (R13.10)    Aspiration Risk  Moderate aspiration risk    Diet Recommendation NPO except meds;Ice chips PRN after oral care   Liquid Administration via: Spoon Medication Administration: Whole meds with puree    Other  Recommendations Oral Care Recommendations: Oral care QID;Oral care prior to ice chip/H20 Other Recommendations: Have oral suction available   Follow up Recommendations Other (comment) (TBD)      Frequency and Duration min 1 x/week  1 week       Prognosis Prognosis for Safe Diet Advancement: Good      Swallow Study   General Date of Onset: 04/24/17 HPI: 71 yo male developed shortness of breath, facial droop, Lt side weakness. 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 Given tPA, intubated for airway protection, and had thrombectomy of Rt M1. Developed Rt leg ischemia after procedure likely from embolic event. PMHx of CAD, ischemic CM, HTN, DM, HLD, CVA. Intubated 9/7-9/9. Type of Study: Bedside Swallow Evaluation Previous Swallow Assessment: none in chart Diet Prior to this Study: NPO Temperature Spikes Noted: No Respiratory  Status: Nasal cannula History of Recent Intubation: Yes Length of Intubations (days): 3 days Date extubated: 04/27/17 Behavior/Cognition: Alert;Cooperative Oral Cavity Assessment: Within Functional Limits Oral Care Completed by SLP: Yes Oral Cavity - Dentition: Adequate  natural dentition Vision: Functional for self-feeding Self-Feeding Abilities: Able to feed self Patient Positioning: Upright in chair Baseline Vocal Quality: Low vocal intensity Volitional Cough: Strong Volitional Swallow: Able to elicit    Oral/Motor/Sensory Function Overall Oral Motor/Sensory Function: Within functional limits   Ice Chips Ice chips: Within functional limits Presentation: Spoon   Thin Liquid Thin Liquid: Within functional limits Presentation: Cup    Nectar Thick Nectar Thick Liquid: Not tested   Honey Thick Honey Thick Liquid: Not tested   Puree Puree: Within functional limits Presentation: Spoon   Solid   GO   Solid: Not tested       Rondel Baton, MS, CCC-SLP Speech-Language Pathologist [phone]   Arlana Lindau 04/27/2017,4:00 PM

## 2017-04-27 NOTE — Progress Notes (Signed)
eLink Physician-Brief Progress Note Patient Name: [name] DOB: 1946/06/01 MRN: [id number]   Date of Service  04/27/2017  HPI/Events of Note  Multiple issues: 1. Speech Swallow evaluation recommends NPO except sips and ice chips and 2. Patient has rales at bases bilaterally.   eICU Interventions  Will order: 1. NPO except sips and chips. 2. Lasix 40 mg IV X 1 now.      Intervention Category Major Interventions: Other:  Sommer,Steven Dennard Nipugene 04/27/2017, 8:08 PM

## 2017-04-27 NOTE — Progress Notes (Signed)
STROKE TEAM PROGRESS NOTE   SUBJECTIVE (INTERVAL HISTORY) His daughter is at the bedside. Patient extubated and tolerating well, awake, alert, following commands. on IV heparin, plan to take right leg drain out today.      OBJECTIVE Temp:  [97.2 F (36.2 C)-99.8 F (37.7 C)] 99.8 F (37.7 C) (09/09 0355) Pulse Rate:  [78-104] 81 (09/09 0600) Cardiac Rhythm: Normal sinus rhythm (09/09 0400) Resp:  [14-27] 19 (09/09 0600) BP: (92-112)/(64-76) 92/66 (09/09 0600) SpO2:  [96 %-100 %] 99 % (09/09 0600) Arterial Line BP: (95-143)/(46-69) 98/51 (09/09 0600) FiO2 (%):  [40 %] 40 % (09/09 0400) Weight:  [204 lb 3.2 oz (92.6 kg)] 204 lb 3.2 oz (92.6 kg) (09/09 0446)  CBC:   Recent Labs Lab 04/26/17 0530 04/27/17 0312  WBC 8.5 12.2*  HGB 10.4* 9.6*  HCT 28.7* 26.9*  MCV 69.7* 69.9*  PLT 194 166    Basic Metabolic Panel:   Recent Labs Lab 04/25/17 0959 04/25/17 1226  04/26/17 0530 04/27/17 0312  NA 128* 128*  < > 129* 129*  K 4.8 4.8  < > 4.2 3.9  CL 101 101  < > 103 104  CO2 19* 18*  < > 18* 16*  GLUCOSE 246* 246*  < > 131* 136*  BUN 18 17  < > 14 12  CREATININE 1.05 1.06  < > 0.87 0.90  CALCIUM 8.1* 8.2*  < > 8.3* 8.0*  MG 1.8  --   --   --   --   PHOS  --  4.4  --   --   --   < > = values in this interval not displayed.  Lipid Panel:     Component Value Date/Time   CHOL 92 04/25/2017 2317   TRIG 82 04/25/2017 2317   HDL 35 (L) 04/25/2017 2317   CHOLHDL 2.6 04/25/2017 2317   VLDL 16 04/25/2017 2317   LDLCALC 41 04/25/2017 2317   HgbA1c:  Lab Results  Component Value Date   HGBA1C 8.8 (H) 04/25/2017   Urine Drug Screen: No results found for: LABOPIA, COCAINSCRNUR, LABBENZ, AMPHETMU, THCU, LABBARB  Alcohol Level No results found for: [facility]  IMAGING I have personally reviewed the radiological images below and agree with the radiology interpretations.  Ct Head Code Stroke Wo Contrast 04/25/2017 FINDINGS Brain: Small lucency in the right insula with loss of  gray-white differentiation (series 3, image 13). Chronic lacunar infarction within the right superior thalamus. Moderate chronic microvascular ischemic changes of white matter and parenchymal volume loss of the brain. No acute intracranial hemorrhage identified. No hydrocephalus or effacement of basilar cisterns. Vascular: Right M1 hyperdensity. Extensive calcific atherosclerosis of carotid siphons. Skull: Normal. Negative for fracture or focal lesion. Sinuses: Left maxillary sinus mucous retention cyst. Otherwise negative. Orbits: No acute finding. Review of the MIP images confirms the above findings  CT head 04/25/2017 IMPRESSION:  1. Small focus of hypoattenuation within right insula compatible with acute infarct, ASPECTS = 9. 2. Moderate chronic microvascular ischemic changes and moderate parenchymal volume loss of the brain. 3. Small chronic lacunar infarct in right superior thalamus.   Dg Chest 2 View 04/24/2017 IMPRESSION: Cardiac enlargement without heart failure Mild right lower lobe atelectasis.  Dg Chest Port 1 View 04/25/2017 IMPRESSION: Appliances appear in satisfactory location. Cardiac enlargement with mild developing pulmonary vascular congestion and possible early perihilar edema.   Ct Head Wo Contrast 04/25/2017 IMPRESSION: Stable small lucency within the right insula compatible with region of acute infarction. No new area  of infarction identified. No acute hemorrhage.   CTA head 04/25/2017 1. Right mid M1 occlusion. Poor downstream right MCA collateralization. 2. Poor enhancement of distal left cavernous and ophthalmic segments of ICA may be due to slow flow secondary to common carotid occlusion or underlying high-grade stenosis. 3. Intracranial atherosclerosis with multiple areas of mild stenosis in the anterior and posterior circulation. 4. No additional large vessel occlusion, aneurysm, or evidence for vascular malformation.    CTA neck 04/25/2017  1. Left common carotid artery  occlusion. Reconstitution of left internal carotid artery from external carotid artery collaterals.  Poor enhancement of left internal carotid artery indicating slow flow.  2. No significant stenosis of the right carotid system in the neck. 3. Right V1 occlusion with reconstitution of right vertebral artery at the V2 segment. 4. Severe left vertebral artery origin stenosis secondary to calcified plaque. 5. 4.2 cm aortic aneurysm. Recommend annual imaging followup by CTA or MRA. T 6. Interstitial pulmonary edema. Enlarged mediastinal lymph nodes is likely related to pulmonary edema.  Ir Percutaneous Art Thrombectomy/infusion Intracranial Inc Diag Angio 04/25/2017 IMPRESSION: Status post ultrasound guided access right common femoral artery with cerebral angiogram and mechanical thrombectomy for treatment of acute ELVO of right M1, with restoration of TICI 3 flow after 1 pass. Completion angiogram at the right common femoral artery puncture demonstrates proximal occlusion of the superficial femoral artery, and there are absent pedal pulses on Doppler examination at the right foot. Signed, Yvone Neu. Loreta Ave DO [facility] Radiology PLAN: Patient will have a noncontrast head CT performed. Patient will have CT angiogram runoff of the abdomen and lower extremities given the absent pedal pulses. Patient will be admitted to ICU maintained on the ventilator. Sheath has been withdrawn from the right common femoral artery, with failure of suture mediated device. Case has been discussed with vascular surgery. Compression dressing should remain for 6 hours.  Ct Angio Ao+bifem W & Or Wo Contrast 04/25/2017 IMPRESSION: Occlusion of the right SFA just after the origin, with patent stump. There is minimal collateral filling of the distal SFA and popliteal artery via patent profunda femoris. There is also absent contrast flow within the right-sided tibial vasculature to the  ankle. On the left, contrast flow is maintained through the popliteal artery, however, there is decrease contrast within the anterior tibial artery and posterior tibial artery. There appears to be at least partial filling of the peroneal artery to the ankle. This may be related to additional emboli versus baseline vascular disease. Moderate atherosclerotic changes of the bilateral iliac, femoral-popliteal, and tibial vasculature, with calcifications through the length of the right SFA, right popliteal artery, and bilateral tibial arteries. Irregularity of the intimal surface of the right common femoral artery, which is patent with mild narrowing, at the site of recent femoral arterial sheath access. No evidence of hematoma with compression dressing in place.   MRI 04/25/2017 IMPRESSION: 1. Faint ischemic changes involving the right insula out and basal ganglia as above. No associated hemorrhage or mass effect.  2. Additional punctate 5 mm subcortical left occipital lobe infarct. 3. Remote right thalamic lacunar infarct with additional tiny remote bilateral cerebellar infarcts. 4. Atrophy with moderate chronic microvascular ischemic disease.  TTE 04/18/2017 Study Conclusions - Left ventricle: The cavity size was moderately dilated. Wall  thickness was normal. Systolic function was severely reduced. The estimated ejection fraction was in the range of 20% to 25%. - Aortic valve: There was mild regurgitation. - Mitral valve: There was  mild regurgitation.    PHYSICAL EXAM Elderly african male intubated and sedated. Afebrile. Head is nontraumatic. Neck is supple without bruit.   Cardiac exam no murmur or gallop. Lungs are clear to auscultation. Right lower extremity minimal JP drain. Neurological Exam :  Awake, alert, follows all simple commands. Eyes attends to both sides, PERRL, fundi not visualized. face symmetric without obvious weakness. Motor system exam shows LUE drift, but 4/5 proximal and  diatal. RUE 5/5. BLE 3+/5 proximal and 5/5 distal. DTR 1+, no Babinski. Sensation symmetric bilaterally, coordination not cooperative due to language barrier and gait not tested.  ASSESSMENT/PLAN Mr. Shaw Dobek is a 71 y.o. male with history of . French-speaking male from [place] with a history of CAD with MI and CABG in [place] in 2007, CHF with EF of 25%, DM2 (recent A1C 9.4%), HLD, HTN, PE, and stroke who was admitted for CHF and was called as a Code Stroke at 1215 on 04/25/2017 after he developed L hemiparesis and a R-sided facial droop.  Patient was administered IV t-PA at 0130 on 04/25/2017.   Stroke: right MCA insular cortex, BG and CR patchy infarcts, as well as left MCA/PCA punctate infarct, s/p right M1 mechanical thrombectomy with TICI3 reperfusion, consistent with cardioembolic strokes, likely due to cardiomyopathy  Resultant  Left UE drift  CT head: Small lucency in the right insula with loss of gray-white differentiation (series 3, image 13). Chronic lacunar infarction within the right superior thalamus  MRI head: Faint ischemic changes in R insula and basal ganglia.  New punctate 5 mm subcortical L occipital lobe infarct.  Remote R thalamic lacunar infarct with additional tiny remote bilateral cerebellar infarcts.  CTA head/neck: Right mid M1 occlusion. Left CCA occlusion with decreased left ICA flow. Bilateral ICA siphon high-grade stenosis. Right V1 occlusion with reconstitution of right VA at the V2 segment.  High-grade L VA stenosis with calcified plaque.  4.2 cm aortic aneurysm.   2D Echo: EF 20-25%. No cardiac embolus  DSA - right MCA TICI 3 reperfusion  LDL 93   HgbA1c 9.4  IV heparin for VTE prophylaxis Diet NPO time specified  aspirin 325 mg daily prior to admission, now on IV heparin. Continue ASA 81 for CAD s/p CABG.   Patient counseled to be compliant with his antithrombotic medications  Ongoing aggressive stroke risk factor management  Therapy recommendations:   pending  Disposition:  Pending  Right femoral artery occlusion s/p thrombectomy  likely in-situ thrombosis rather than thromboembolism  S/p thrombectomy and endarterectomy and patch angioplasty of the below-the-knee popliteal artery.    Plan to d/c RLE drainage   Cardiomyopathy / CHF exacerbation  History of CAD/MI s/p CABG in 2007  EF 20-25%  Admitted for CHF exacerbation  acute ST segment elevation during vascular procedure  Elevated trop 0.05-0.21-0.24  On heparin IV now  Cardiology on board  Urinary retention  Nephrology on board  Status post Foley catheter  Continue Foley catheter  Left common carotid occlusion, likely chronic  CTA neck showed Left CCA occlusion.   Reconstitution of left ICA from external carotid artery collaterals.    slow flow of ICA   Avoid hypotension  Continue ASA  Intracranial stenosis  Bilateral ICA siphon high-grade stenosis  Left VA origin high-grade stenosis  Right VA occlusion with reconstitution  Avoid hypotension  Continue ASA  Hypotension  Improved after extubation  Off pressor  Long-term BP goal normotensive  Hyperlipidemia  Home meds: atorvastatin 80 mg PO daily, resumed in hospital  LDL 93, goal < 70  Continue statin at discharge  Diabetes  HgbA1c 9.4, goal < 7.0  Uncontrolled  Hyperglycemia  DM Coordinator consulted  SSI  CBG monitoring  Other Stroke Risk Factors  Advanced age  Hx stroke/TIA  CAD/MI s/p CABG  History of PE  Other Active Problems  Hyponatremia  Elevated troponin  Acute blood loss anemia  Hospital day # 3  This patient is critically ill due to embolic stroke due to right MCA occlusion, status post mechanical thrombectomy, common carotid occlusion on the left, femoral artery occlusion status post thrombectomy, urine retention, cardiomyopathy, hypotension and at significant risk of neurological worsening, death form recurrent stroke, hemorrhagic  transformation, femoral artery occlusion, septic shock, heart failure and cardiogenic shock. This patient's care requires constant monitoring of vital signs, hemodynamics, respiratory and cardiac monitoring, review of multiple databases, neurological assessment, discussion with family, other specialists and medical decision making of high complexity. I spent 35 minutes of neurocritical care time in the care of this patient.  Marvel PlanJindong Silvestre Mines, MD PhD Stroke Neurology 04/27/2017 11:32 AM    To contact Stroke Continuity provider, please refer to [email or web address]. After hours, contact General Neurology

## 2017-04-27 NOTE — Progress Notes (Signed)
[facility]  Subjective  - sedated on vent   Objective 92/66 81 99.8 F (37.7 C) (Oral) 19 99%  Intake/Output Summary (Last 24 hours) at 04/27/17 0905 Last data filed at 04/27/17 0600  Gross per 24 hour  Intake          2505.17 ml  Output              595 ml  Net          1910.17 ml   Easily audible PT doppler bilat Incision clean.  JP minimal  Assessment/Planning: S/p right leg thrombectomy.  Adequate perfusion  D/c drain today  [name], [name] 04/27/2017 9:05 AM --  Laboratory Lab Results:  Recent Labs  04/26/17 0530 04/27/17 0312  WBC 8.5 12.2*  HGB 10.4* 9.6*  HCT 28.7* 26.9*  PLT 194 166   BMET  Recent Labs  04/26/17 0530 04/27/17 0312  NA 129* 129*  K 4.2 3.9  CL 103 104  CO2 18* 16*  GLUCOSE 131* 136*  BUN 14 12  CREATININE 0.87 0.90  CALCIUM 8.3* 8.0*    COAG Lab Results  Component Value Date   INR 1.14 04/25/2017   No results found for: PTT

## 2017-04-27 NOTE — Progress Notes (Signed)
ANTICOAGULATION CONSULT NOTE  Pharmacy Consult for heparin Indication: stroke and SFA thrombus  No Known Allergies  Patient Measurements: Height: 6' (182.9 cm) Weight: 204 lb 3.2 oz (92.6 kg) IBW/kg (Calculated) : 77.6 Heparin Dosing Weight: 87.7kg  Vital Signs: Temp: 99.7 F (37.6 C) (09/09 0800) Temp Source: Oral (09/09 0800) BP: 119/74 (09/09 1000) Pulse Rate: 96 (09/09 1100)  Labs:  Recent Labs  04/25/17 0100  04/25/17 2316 04/26/17 0530 04/26/17 2043 04/27/17 0312 04/27/17 1200  HGB  --   < > 10.5* 10.4*  --  9.6*  --   HCT  --   < > 28.5* 28.7*  --  26.9*  --   PLT  --   < > 194 194  --  166  --   APTT 34  --   --   --   --   --   --   LABPROT 14.5  --   --   --   --   --   --   INR 1.14  --   --   --   --   --   --   HEPARINUNFRC  --   --   --   --  0.25* 0.31 0.38  CREATININE  --   < > 0.87 0.87  --  0.90  --   TROPONINI  --   --  0.21* 0.24*  --   --   --   < > = values in this interval not displayed.  Estimated Creatinine Clearance: 83.8 mL/min (by C-G formula based on SCr of 0.9 mg/dL).  Medications:  Infusions:  . sodium chloride 10 mL/hr at 04/27/17 1244  . clevidipine Stopped (04/25/17 1100)  . heparin 1,150 Units/hr (04/27/17 0836)  . magnesium sulfate 1 - 4 g bolus IVPB      Assessment: 71 yom presented with an acute stroke and subsequent SFA thrombus. Received tPA 9/7 AM and now transitioned to IV heparin. CBC is stable. He is not on anticoagulation PTA and it has been >24 hours since tPA. Heparin level remains therapeutic at 0.38. No bleeding noted.   Goal of Therapy:  Heparin level 0.3-0.5 units/ml Monitor platelets by anticoagulation protocol: Yes   Plan:  Continue heparin gtt 1150 units/hr - NO BOLUS Daily heparin level and CBC Monitor for s/sx bleeding  Lysle Pearlachel Janae Bonser, PharmD, BCPS 04/27/2017 1:05 PM

## 2017-04-28 ENCOUNTER — Encounter (HOSPITAL_COMMUNITY): Payer: Self-pay | Admitting: Vascular Surgery

## 2017-04-28 ENCOUNTER — Inpatient Hospital Stay (HOSPITAL_COMMUNITY): Payer: Medicaid Other

## 2017-04-28 DIAGNOSIS — E1159 Type 2 diabetes mellitus with other circulatory complications: Secondary | ICD-10-CM

## 2017-04-28 DIAGNOSIS — I998 Other disorder of circulatory system: Secondary | ICD-10-CM

## 2017-04-28 DIAGNOSIS — Z794 Long term (current) use of insulin: Secondary | ICD-10-CM

## 2017-04-28 DIAGNOSIS — Z951 Presence of aortocoronary bypass graft: Secondary | ICD-10-CM

## 2017-04-28 LAB — BPAM RBC
BLOOD PRODUCT EXPIRATION DATE: 201809192359
Blood Product Expiration Date: 201809192359
UNIT TYPE AND RH: 7300
Unit Type and Rh: 7300

## 2017-04-28 LAB — GLUCOSE, CAPILLARY
GLUCOSE-CAPILLARY: 128 mg/dL — AB (ref 65–99)
GLUCOSE-CAPILLARY: 212 mg/dL — AB (ref 65–99)
Glucose-Capillary: 135 mg/dL — ABNORMAL HIGH (ref 65–99)
Glucose-Capillary: 180 mg/dL — ABNORMAL HIGH (ref 65–99)
Glucose-Capillary: 223 mg/dL — ABNORMAL HIGH (ref 65–99)
Glucose-Capillary: 185 mg/dL — ABNORMAL HIGH (ref 65–99)

## 2017-04-28 LAB — TYPE AND SCREEN
ABO/RH(D): B POS
ANTIBODY SCREEN: NEGATIVE
Unit division: 0
Unit division: 0

## 2017-04-28 LAB — BASIC METABOLIC PANEL
ANION GAP: 8 (ref 5–15)
BUN: 19 mg/dL (ref 6–20)
CHLORIDE: 107 mmol/L (ref 101–111)
CO2: 18 mmol/L — ABNORMAL LOW (ref 22–32)
Calcium: 8.6 mg/dL — ABNORMAL LOW (ref 8.9–10.3)
Creatinine, Ser: 1 mg/dL (ref 0.61–1.24)
Glucose, Bld: 141 mg/dL — ABNORMAL HIGH (ref 65–99)
POTASSIUM: 3.9 mmol/L (ref 3.5–5.1)
SODIUM: 133 mmol/L — AB (ref 135–145)

## 2017-04-28 LAB — CBC
HEMATOCRIT: 27.5 % — AB (ref 39.0–52.0)
HEMOGLOBIN: 9.8 g/dL — AB (ref 13.0–17.0)
MCH: 24.9 pg — ABNORMAL LOW (ref 26.0–34.0)
MCHC: 35.6 g/dL (ref 30.0–36.0)
MCV: 69.8 fL — AB (ref 78.0–100.0)
PLATELETS: 181 10*3/uL (ref 150–400)
RBC: 3.94 MIL/uL — AB (ref 4.22–5.81)
RDW: 14.4 % (ref 11.5–15.5)
WBC: 11.1 10*3/uL — AB (ref 4.0–10.5)

## 2017-04-28 LAB — HEPARIN LEVEL (UNFRACTIONATED)
Heparin Unfractionated: 0.2 IU/mL — ABNORMAL LOW (ref 0.30–0.70)
Heparin Unfractionated: 0.28 IU/mL — ABNORMAL LOW (ref 0.30–0.70)

## 2017-04-28 MED ORDER — WARFARIN - PHARMACIST DOSING INPATIENT
Freq: Every day | Status: DC
  Administered 2017-05-03 – 2017-05-05 (×3)

## 2017-04-28 MED ORDER — LOSARTAN POTASSIUM 50 MG PO TABS
50.0000 mg | ORAL_TABLET | Freq: Every day | ORAL | Status: DC
  Administered 2017-04-28 – 2017-05-05 (×8): 50 mg via ORAL
  Filled 2017-04-28 (×8): qty 1

## 2017-04-28 MED ORDER — PREMIER PROTEIN SHAKE
11.0000 [oz_av] | Freq: Two times a day (BID) | ORAL | Status: DC
  Administered 2017-04-29 – 2017-05-04 (×9): 11 [oz_av] via ORAL
  Filled 2017-04-28 (×31): qty 325.31

## 2017-04-28 MED ORDER — WHITE PETROLATUM GEL
Status: AC
  Administered 2017-04-28: 08:00:00
  Filled 2017-04-28: qty 1

## 2017-04-28 MED ORDER — WARFARIN SODIUM 5 MG PO TABS
5.0000 mg | ORAL_TABLET | Freq: Once | ORAL | Status: AC
  Administered 2017-04-28: 5 mg via ORAL
  Filled 2017-04-28 (×2): qty 1

## 2017-04-28 MED ORDER — HEPARIN (PORCINE) IN NACL 100-0.45 UNIT/ML-% IJ SOLN
1400.0000 [IU]/h | INTRAMUSCULAR | Status: DC
  Administered 2017-04-29 (×2): 1400 [IU]/h via INTRAVENOUS
  Filled 2017-04-28 (×3): qty 250

## 2017-04-28 MED ORDER — POTASSIUM CHLORIDE CRYS ER 20 MEQ PO TBCR
20.0000 meq | EXTENDED_RELEASE_TABLET | Freq: Two times a day (BID) | ORAL | Status: DC
  Administered 2017-04-28 – 2017-05-05 (×15): 20 meq via ORAL
  Filled 2017-04-28 (×14): qty 1

## 2017-04-28 MED ORDER — ATORVASTATIN CALCIUM 80 MG PO TABS
80.0000 mg | ORAL_TABLET | Freq: Every day | ORAL | Status: DC
  Administered 2017-04-28 – 2017-05-05 (×8): 80 mg via ORAL
  Filled 2017-04-28 (×7): qty 1
  Filled 2017-04-28: qty 2
  Filled 2017-04-28: qty 1

## 2017-04-28 MED ORDER — SPIRONOLACTONE 25 MG PO TABS
12.5000 mg | ORAL_TABLET | Freq: Every day | ORAL | Status: DC
  Administered 2017-04-28 – 2017-05-01 (×4): 12.5 mg via ORAL
  Filled 2017-04-28 (×4): qty 1

## 2017-04-28 MED ORDER — FUROSEMIDE 10 MG/ML IJ SOLN
40.0000 mg | Freq: Two times a day (BID) | INTRAMUSCULAR | Status: DC
  Administered 2017-04-28 – 2017-04-29 (×3): 40 mg via INTRAVENOUS
  Filled 2017-04-28 (×3): qty 4

## 2017-04-28 NOTE — Progress Notes (Signed)
ANTICOAGULATION CONSULT NOTE  Pharmacy Consult for heparin Indication: stroke and SFA thrombus  No Known Allergies  Patient Measurements: Height: 6' (182.9 cm) Weight: 204 lb 3.2 oz (92.6 kg) IBW/kg (Calculated) : 77.6 Heparin Dosing Weight: 87.7kg  Vital Signs: Temp: 98.1 F (36.7 C) (09/10 1150) Temp Source: Oral (09/10 1150) BP: 112/80 (09/10 1200) Pulse Rate: 77 (09/10 1200)  Labs:  Recent Labs  04/25/17 2316 04/26/17 0530  04/27/17 0312 04/27/17 1200 04/28/17 0436  HGB 10.5* 10.4*  --  9.6*  --  9.8*  HCT 28.5* 28.7*  --  26.9*  --  27.5*  PLT 194 194  --  166  --  181  HEPARINUNFRC  --   --   < > 0.31 0.38 0.20*  CREATININE 0.87 0.87  --  0.90  --  1.00  TROPONINI 0.21* 0.24*  --   --   --   --   < > = values in this interval not displayed.  Estimated Creatinine Clearance: 75.4 mL/min (by C-G formula based on SCr of 1 mg/dL).  Medications:  Infusions:  . sodium chloride Stopped (04/28/17 0053)  . clevidipine Stopped (04/25/17 1100)  . heparin 1,300 Units/hr (04/28/17 1204)  . magnesium sulfate 1 - 4 g bolus IVPB      Assessment: 71 yom presented with an acute stroke and subsequent SFA thrombus. Received tPA 9/7 AM and now transitioned to IV heparin. CBC is stable. He is not on anticoagulation PTA and it has been >24 hours since tPA.  Heparin level subtherapeutic at 0.2 this AM with no infusion issues per RN. CBC stable and no bleeding.  To begin warfarin today  Goal of Therapy:  Heparin level 0.3-0.5 units/ml Monitor platelets by anticoagulation protocol: Yes   Plan:  Continue heparin gtt 1300 units/hr Warfarin 5 mg x 1 1800 HL Daily HL CBC INR Monitor for s/s bleeding  [name], PharmD, BCPS, BCCCP Clinical Pharmacist Clinical phone for 04/28/2017 from 7a-3:30p: [phone] If after 3:30p, please call main pharmacy at: [phone] 04/28/2017 1:02 PM

## 2017-04-28 NOTE — Care Management Note (Signed)
Case Management Note  Patient Details  Name: [name] MRN: [id number] Date of Birth: May 07, 1946  Subjective/Objective:     S/p Right femoropopliteal thromboembolectomy Endarterectomy right below-the-knee popliteal artery with bovine patch angioplasty Proximal endarterectomy of right tibioperoneal trunk and peroneal artery               Action/Plan:  PTA independent from home.  Pt is ventilated with daughter at bedside.  Pt is active with [facility] and gets medications from there - attending would need to ensure at discharge pt is on the least expensive but effective drug.  Due to pt not being a [place] Citizen - assistance programs can not be utilized.  CM will continue to follow for discharge needs   Expected Discharge Date:                  Expected Discharge Plan:  Home/Self Care  In-House Referral:     Discharge planning Services  CM Consult  Post Acute Care Choice:    Choice offered to:     DME Arranged:    DME Agency:     HH Arranged:    HH Agency:     Status of Service:     If discussed at [employer] Length of Stay Meetings, dates discussed:    Additional Comments: 04/28/2017 CIR recommended - CSW following for back up plan Cherylann ParrClaxton, Joshawn Crissman S, RN 04/28/2017, 2:40 PM

## 2017-04-28 NOTE — Progress Notes (Signed)
STROKE TEAM PROGRESS NOTE   SUBJECTIVE (INTERVAL HISTORY) His daughter is at the bedside. Patient is doing well, no complains and no acute event overnight. Right leg JP drain off. Suture dry clean. On heparin IV.       OBJECTIVE Temp:  [98.1 F (36.7 C)-98.9 F (37.2 C)] 98.1 F (36.7 C) (09/10 1150) Pulse Rate:  [30-96] 78 (09/10 1500) Cardiac Rhythm: Normal sinus rhythm (09/10 1200) Resp:  [20-34] 27 (09/10 1500) BP: (99-145)/(36-89) 102/50 (09/10 1500) SpO2:  [97 %-100 %] 100 % (09/10 1500) Arterial Line BP: (127-174)/(63-84) 160/80 (09/10 1100)  CBC:   Recent Labs Lab 04/27/17 0312 04/28/17 0436  WBC 12.2* 11.1*  HGB 9.6* 9.8*  HCT 26.9* 27.5*  MCV 69.9* 69.8*  PLT 166 181    Basic Metabolic Panel:   Recent Labs Lab 04/25/17 0959 04/25/17 1226  04/27/17 0312 04/28/17 0436  NA 128* 128*  < > 129* 133*  K 4.8 4.8  < > 3.9 3.9  CL 101 101  < > 104 107  CO2 19* 18*  < > 16* 18*  GLUCOSE 246* 246*  < > 136* 141*  BUN 18 17  < > 12 19  CREATININE 1.05 1.06  < > 0.90 1.00  CALCIUM 8.1* 8.2*  < > 8.0* 8.6*  MG 1.8  --   --   --   --   PHOS  --  4.4  --   --   --   < > = values in this interval not displayed.  Lipid Panel:     Component Value Date/Time   CHOL 92 04/25/2017 2317   TRIG 82 04/25/2017 2317   HDL 35 (L) 04/25/2017 2317   CHOLHDL 2.6 04/25/2017 2317   VLDL 16 04/25/2017 2317   LDLCALC 41 04/25/2017 2317   HgbA1c:  Lab Results  Component Value Date   HGBA1C 8.8 (H) 04/25/2017   Urine Drug Screen: No results found for: LABOPIA, COCAINSCRNUR, LABBENZ, AMPHETMU, THCU, LABBARB  Alcohol Level No results found for: [facility]  IMAGING I have personally reviewed the radiological images below and agree with the radiology interpretations.  Ct Head Code Stroke Wo Contrast 04/25/2017 FINDINGS Brain: Small lucency in the right insula with loss of gray-white differentiation (series 3, image 13). Chronic lacunar infarction within the right superior thalamus.  Moderate chronic microvascular ischemic changes of white matter and parenchymal volume loss of the brain. No acute intracranial hemorrhage identified. No hydrocephalus or effacement of basilar cisterns. Vascular: Right M1 hyperdensity. Extensive calcific atherosclerosis of carotid siphons. Skull: Normal. Negative for fracture or focal lesion. Sinuses: Left maxillary sinus mucous retention cyst. Otherwise negative. Orbits: No acute finding. Review of the MIP images confirms the above findings  CT head 04/25/2017 IMPRESSION:  1. Small focus of hypoattenuation within right insula compatible with acute infarct, ASPECTS = 9. 2. Moderate chronic microvascular ischemic changes and moderate parenchymal volume loss of the brain. 3. Small chronic lacunar infarct in right superior thalamus.   Dg Chest 2 View 04/24/2017 IMPRESSION: Cardiac enlargement without heart failure Mild right lower lobe atelectasis.  Dg Chest Port 1 View 04/25/2017 IMPRESSION: Appliances appear in satisfactory location. Cardiac enlargement with mild developing pulmonary vascular congestion and possible early perihilar edema.   Ct Head Wo Contrast 04/25/2017 IMPRESSION: Stable small lucency within the right insula compatible with region of acute infarction. No new area of infarction identified. No acute hemorrhage.   CTA head 04/25/2017 1. Right mid M1 occlusion. Poor downstream right MCA collateralization. 2.  Poor enhancement of distal left cavernous and ophthalmic segments of ICA may be due to slow flow secondary to common carotid occlusion or underlying high-grade stenosis. 3. Intracranial atherosclerosis with multiple areas of mild stenosis in the anterior and posterior circulation. 4. No additional large vessel occlusion, aneurysm, or evidence for vascular malformation.    CTA neck 04/25/2017  1. Left common carotid artery occlusion. Reconstitution of left internal carotid artery from external carotid artery collaterals.  Poor  enhancement of left internal carotid artery indicating slow flow.  2. No significant stenosis of the right carotid system in the neck. 3. Right V1 occlusion with reconstitution of right vertebral artery at the V2 segment. 4. Severe left vertebral artery origin stenosis secondary to calcified plaque. 5. 4.2 cm aortic aneurysm. Recommend annual imaging followup by CTA or MRA. T 6. Interstitial pulmonary edema. Enlarged mediastinal lymph nodes is likely related to pulmonary edema.  Ir Percutaneous Art Thrombectomy/infusion Intracranial Inc Diag Angio 04/25/2017 IMPRESSION: Status post ultrasound guided access right common femoral artery with cerebral angiogram and mechanical thrombectomy for treatment of acute ELVO of right M1, with restoration of TICI 3 flow after 1 pass. Completion angiogram at the right common femoral artery puncture demonstrates proximal occlusion of the superficial femoral artery, and there are absent pedal pulses on Doppler examination at the right foot. Signed, Yvone Neu. Loreta Ave DO [facility] Radiology PLAN: Patient will have a noncontrast head CT performed. Patient will have CT angiogram runoff of the abdomen and lower extremities given the absent pedal pulses. Patient will be admitted to ICU maintained on the ventilator. Sheath has been withdrawn from the right common femoral artery, with failure of suture mediated device. Case has been discussed with vascular surgery. Compression dressing should remain for 6 hours.  Ct Angio Ao+bifem W & Or Wo Contrast 04/25/2017 IMPRESSION: Occlusion of the right SFA just after the origin, with patent stump. There is minimal collateral filling of the distal SFA and popliteal artery via patent profunda femoris. There is also absent contrast flow within the right-sided tibial vasculature to the ankle. On the left, contrast flow is maintained through the popliteal artery, however, there is decrease  contrast within the anterior tibial artery and posterior tibial artery. There appears to be at least partial filling of the peroneal artery to the ankle. This may be related to additional emboli versus baseline vascular disease. Moderate atherosclerotic changes of the bilateral iliac, femoral-popliteal, and tibial vasculature, with calcifications through the length of the right SFA, right popliteal artery, and bilateral tibial arteries. Irregularity of the intimal surface of the right common femoral artery, which is patent with mild narrowing, at the site of recent femoral arterial sheath access. No evidence of hematoma with compression dressing in place.   MRI 04/25/2017 IMPRESSION: 1. Faint ischemic changes involving the right insula out and basal ganglia as above. No associated hemorrhage or mass effect.  2. Additional punctate 5 mm subcortical left occipital lobe infarct. 3. Remote right thalamic lacunar infarct with additional tiny remote bilateral cerebellar infarcts. 4. Atrophy with moderate chronic microvascular ischemic disease.  TTE 04/18/2017 Study Conclusions - Left ventricle: The cavity size was moderately dilated. Wall  thickness was normal. Systolic function was severely reduced. The estimated ejection fraction was in the range of 20% to 25%. - Aortic valve: There was mild regurgitation. - Mitral valve: There was mild regurgitation.    PHYSICAL EXAM Elderly african male intubated and sedated. Afebrile. Head is nontraumatic. Neck is supple without bruit.  Cardiac exam no murmur or gallop. Lungs are clear to auscultation. Right lower extremity minimal JP drain. Neurological Exam :  Awake, alert, follows all simple commands. Eyes attends to both sides, PERRL, fundi not visualized. face symmetric without obvious weakness. Motor system exam shows LUE drift, but 4/5 proximal and diatal. RUE 5/5. BLE 4/5 proximal and 5/5 distal. DTR 1+, no Babinski. Sensation symmetric bilaterally,  coordination not cooperative due to language barrier and gait not tested.  ASSESSMENT/PLAN [name] is a 71 y.o. male with history of . French-speaking male from [place] with a history of CAD with MI and CABG in [place] in 2007, CHF with EF of 25%, DM2 (recent A1C 9.4%), HLD, HTN, PE, and stroke who was admitted for CHF and was called as a Code Stroke at 1215 on 04/25/2017 after he developed L hemiparesis and a R-sided facial droop.  Patient was administered IV t-PA at 0130 on 04/25/2017.   Stroke: right MCA insular cortex, BG and CR patchy infarcts, as well as left MCA/PCA punctate infarct, s/p right M1 mechanical thrombectomy with TICI3 reperfusion, consistent with cardioembolic strokes, likely due to cardiomyopathy  Resultant  Left UE drift  CT head: Small lucency in the right insula with loss of gray-white differentiation (series 3, image 13). Chronic lacunar infarction within the right superior thalamus  MRI head: Faint ischemic changes in R insula and basal ganglia.  New punctate 5 mm subcortical L occipital lobe infarct.  Remote R thalamic lacunar infarct with additional tiny remote bilateral cerebellar infarcts.  CTA head/neck: Right mid M1 occlusion. Left CCA occlusion with decreased left ICA flow. Bilateral ICA siphon high-grade stenosis. Right V1 occlusion with reconstitution of right VA at the V2 segment.  High-grade L VA stenosis with calcified plaque.  4.2 cm aortic aneurysm.   2D Echo: EF 20-25%. No cardiac embolus  DSA - right MCA TICI 3 reperfusion  LDL 93   HgbA1c 9.4  IV heparin for VTE prophylaxis DIET DYS 3 Room service appropriate? Yes; Fluid consistency: Thin  aspirin 325 mg daily prior to admission, now on IV heparin. Continue ASA 81 for CAD s/p CABG.   Patient counseled to be compliant with his antithrombotic medications  Ongoing aggressive stroke risk factor management  Therapy recommendations:  pending  Disposition:  Pending  Right femoral artery  occlusion s/p thrombectomy  likely in-situ thrombosis rather than thromboembolism  S/p thrombectomy and endarterectomy and patch angioplasty of the below-the-knee popliteal artery.    Plan to d/c RLE drainage   Cardiomyopathy / CHF exacerbation  History of CAD/MI s/p CABG in 2007  EF 20-25%  Admitted for CHF exacerbation  acute ST segment elevation during vascular procedure  Elevated trop 0.05-0.21-0.24  On heparin IV now  Cardiology on board  Urinary retention  Nephrology on board  Status post Foley catheter  Continue Foley catheter  Left common carotid occlusion, likely chronic  CTA neck showed Left CCA occlusion.   Reconstitution of left ICA from external carotid artery collaterals.    slow flow of ICA   Avoid hypotension  Continue ASA  Intracranial stenosis  Bilateral ICA siphon high-grade stenosis  Left VA origin high-grade stenosis  Right VA occlusion with reconstitution  Avoid hypotension  Continue ASA  Hypotension  Improved after extubation  Off pressor  Long-term BP goal normotensive  Hyperlipidemia  Home meds: atorvastatin 80 mg PO daily, resumed in hospital  LDL 93, goal < 70  Continue statin at discharge  Diabetes  HgbA1c 9.4, goal < 7.0  Uncontrolled  Hyperglycemia  DM Coordinator consulted  SSI  CBG monitoring  Other Stroke Risk Factors  Advanced age  Hx stroke/TIA  CAD/MI s/p CABG  History of PE  Other Active Problems  Hyponatremia  Elevated troponin  Acute blood loss anemia - stable  Hospital day # 4  This patient is critically ill due to embolic stroke due to right MCA occlusion, status post mechanical thrombectomy, common carotid occlusion on the left, femoral artery occlusion status post thrombectomy, urine retention, cardiomyopathy, hypotension and at significant risk of neurological worsening, death form recurrent stroke, hemorrhagic transformation, femoral artery occlusion, septic shock,  heart failure and cardiogenic shock. This patient's care requires constant monitoring of vital signs, hemodynamics, respiratory and cardiac monitoring, review of multiple databases, neurological assessment, discussion with family, other specialists and medical decision making of high complexity. I spent 35 minutes of neurocritical care time in the care of this patient.  Marvel Plan, MD PhD Stroke Neurology 04/28/2017 3:18 PM    To contact Stroke Continuity provider, please refer to [email or web address]. After hours, contact General Neurology

## 2017-04-28 NOTE — Progress Notes (Addendum)
  Progress Note    04/28/2017 7:42 AM 3 Days Post-Op  Subjective:  Awake off vent  Afebrile HR 80's-90's  100's-120's systolic 99% 3LO2NC  Vitals:   04/28/17 0500 04/28/17 0700  BP: 109/75 114/80  Pulse: 80 82  Resp: (!) 24 (!) 25  Temp:    SpO2: 99% 99%    Physical Exam: Lungs:  Non labored Incisions:  Clean and dry with staples intact, drain exit site bandaged Extremities:  Right foot is warm; +doppler signals bilateral PT and right AT and peroneal.   CBC    Component Value Date/Time   WBC 11.1 (H) 04/28/2017 0436   RBC 3.94 (L) 04/28/2017 0436   HGB 9.8 (L) 04/28/2017 0436   HCT 27.5 (L) 04/28/2017 0436   PLT 181 04/28/2017 0436   MCV 69.8 (L) 04/28/2017 0436   MCH 24.9 (L) 04/28/2017 0436   MCHC 35.6 04/28/2017 0436   RDW 14.4 04/28/2017 0436   LYMPHSABS 2.9 04/17/2017 1050   MONOABS 0.6 04/17/2017 1050   EOSABS 0.2 04/17/2017 1050   BASOSABS 0.1 04/17/2017 1050    BMET    Component Value Date/Time   NA 133 (L) 04/28/2017 0436   K 3.9 04/28/2017 0436   CL 107 04/28/2017 0436   CO2 18 (L) 04/28/2017 0436   GLUCOSE 141 (H) 04/28/2017 0436   BUN 19 04/28/2017 0436   CREATININE 1.00 04/28/2017 0436   CALCIUM 8.6 (L) 04/28/2017 0436   GFRNONAA >60 04/28/2017 0436   GFRAA >60 04/28/2017 0436    INR    Component Value Date/Time   INR 1.14 04/25/2017 0100     Intake/Output Summary (Last 24 hours) at 04/28/17 0742 Last data filed at 04/28/17 0700  Gross per 24 hour  Intake           729.19 ml  Output             1520 ml  Net          -790.81 ml     Assessment:  71 y.o. male is s/p:  1. Right femoropopliteal thromboembolectomy 2. Right below-the-knee popliteal artery endarterectomy with bovine patch angioplasty 3.   Proximal endarterectomy of right tibioperoneal trunk and peroneal artery  3 Days Post-Op  Plan: -pt should be getting adequate perfusion to right foot with + doppler signals on the right PT/AT/peroneal -DVT prophylaxis:   Heparin gtt -incision is healing fine with staples in tact-minimal drainage.   Doreatha MassedSamantha Rhyne, PA-C [facility] [phone] 04/28/2017 7:42 AM  Addendum  I have independently interviewed and examined the patient, and I agree with the physician assistant's findings.  The ABI are consistent with CHRONIC disease as were the intraoperative findings.  I would not be surprised if the the R SFA reoccludes given the severity of the proximal tibial artery disease.  He obviously has significant collaterals already developed or the right foot would already be dead.  - Staples out in one more week - Nothing more to add to this patient's care for now - Will periodically check on patient  Leonides SakeBrian Cason Dabney, MD, [facility] [place] Office: [phone] Pager: [phone]  04/28/2017, 7:58 AM

## 2017-04-28 NOTE — Progress Notes (Signed)
Occupational Therapy Treatment Patient Details Name: [name] MRN: [id number] DOB: 1945/10/06 Today's Date: 04/28/2017    History of present illness 71 y.o.malewith a history of MI and CABG 2007, DM type 2, HTN, stroke and HLD who presented for SOB possible CHF exacerbation. During hosptial course developed facial droop, Lt side weakness. Code stroke called, imaging CTA + Right MCA M1 embolic occlusion. Patient taken to IR and received TPA.   OT comments  Pt demonstrating progress toward OT goals this session. He was able to demonstrate selective attention during ADL tasks and complete simulated toilet transfers with overall mod assist +2 for stability due to L lateral lean during dynamic standing tasks. Pt demonstrating potential signs of visual perceptual deficits and will continue to assess this. He additionally demonstrated the ability to complete seated grooming tasks with min assist for balance and verbal cues to initiate activity. Pt remains an excellent candidate for CIR level therapies and OT will continue to follow while admitted.    Follow Up Recommendations  CIR;Supervision/Assistance - 24 hour    Equipment Recommendations  Other (comment) (defer to next venue of care)    Recommendations for Other Services Rehab consult    Precautions / Restrictions Precautions Precautions: Fall Precaution Comments: left post op shoe from fractured toe  Restrictions Weight Bearing Restrictions: No       Mobility Bed Mobility Overal bed mobility: Needs Assistance Bed Mobility: Rolling;Sidelying to Sit Rolling: Min assist Sidelying to sit: Mod assist;+2 for physical assistance;HOB elevated       General bed mobility comments: Cues for sequencing for rolling and to reach for rail; able to bring LEs to EOB, assist with trunk and scooting bottom to EOB. no dizziness. Posterior bias EOB.  Transfers Overall transfer level: Needs assistance Equipment used:  (Utilizing back of locked  recliner chair. ) Transfers: Sit to/from Stand Sit to Stand: Min assist         General transfer comment: Assist to power to standing pulling up on back of recliner chair. Left lateral lean in standing.    Balance Overall balance assessment: Needs assistance Sitting-balance support: Feet unsupported;Bilateral upper extremity supported Sitting balance-Leahy Scale: Poor Sitting balance - Comments: Pt with posterior pelvic tilt and lean sitting EOB needing Min guard-Mod A for balance. When fatigued, pt with LOB posteriorly and to left. Able to initiate trunk for anterior weight shift and lean but not sustain. Difficulty regaining midline positioning following reach to the L.  Postural control: Posterior lean;Left lateral lean Standing balance support: During functional activity;Bilateral upper extremity supported Standing balance-Leahy Scale: Poor Standing balance comment: Requires UE support in standing on back of recliner with left lateral lean. Mod assist to maintain balance in standing position.                            ADL either performed or assessed with clinical judgement   ADL Overall ADL's : Needs assistance/impaired     Grooming: Minimal assistance;Sitting Grooming Details (indicate cue type and reason): Min assist for sitting balance. VC's to initiate grooming tasks.          Upper Body Dressing : Moderate assistance Upper Body Dressing Details (indicate cue type and reason): To change gown due to bed wet on arrival.  Lower Body Dressing: Total assistance   Toilet Transfer: Minimal assistance;Moderate assistance;+2 for physical assistance Toilet Transfer Details (indicate cue type and reason): Able to power up into standing with min assist +2 and  requiring mod assist +2 to maintain balance. Able to ambulate pushing recliner for UE support with min assist +2.         Functional mobility during ADLs: Moderate assistance;+2 for physical assistance General  ADL Comments: Pt highly motivated to participate in ADL.      Vision   Vision Assessment?: Yes Eye Alignment: Within Functional Limits Ocular Range of Motion: Within Functional Limits Alignment/Gaze Preference: Within Defined Limits Tracking/Visual Pursuits: Other (comment) (Able to complete full ocular ROM but increased head turns with attempts to track to the L. ) Saccades: Within functional limits Convergence: Within functional limits Depth Perception: Undershoots Additional Comments: When tracking, pt was able to track to the L past midline and maintain L gaze to have conversation with his friend. However, when testing tracking, pt did have increased head turns to maintain continuity in tracking to the L that were not present to the R. Noted difficulty with reaching to target with visual feedback which may indicate depth perception/visual perceptual deficits. Will continue to assess.    Perception     Praxis      Cognition Arousal/Alertness: Awake/alert Behavior During Therapy: WFL for tasks assessed/performed Overall Cognitive Status: Difficult to assess Area of Impairment: Attention;Problem solving                   Current Attention Level: Selective   Following Commands: Follows one step commands consistently;Follows one step commands with increased time     Problem Solving: Difficulty sequencing General Comments: Seems WFL for basic tasks. Friend translated during session. Follows 1-2 step commands consistently.         Exercises     Shoulder Instructions       General Comments Friend able to assist with translation SpO2 >92% on RA throughout session.     Pertinent Vitals/ Pain       Pain Assessment: No/denies pain  Home Living                                          Prior Functioning/Environment              Frequency  Min 3X/week        Progress Toward Goals  OT Goals(current goals can now be found in the care plan  section)  Progress towards OT goals: Progressing toward goals  Acute Rehab OT Goals Patient Stated Goal: none stated OT Goal Formulation: Patient unable to participate in goal setting  Plan      Co-evaluation    PT/OT/SLP Co-Evaluation/Treatment: Yes Reason for Co-Treatment: Complexity of the patient's impairments (multi-system involvement);For patient/therapist safety;To address functional/ADL transfers PT goals addressed during session: Mobility/safety with mobility;Strengthening/ROM;Balance OT goals addressed during session: ADL's and self-care;Strengthening/ROM      AM-PAC PT "6 Clicks" Daily Activity     Outcome Measure   Help from another person eating meals?: A Little Help from another person taking care of personal grooming?: A Little Help from another person toileting, which includes using toliet, bedpan, or urinal?: A Lot Help from another person bathing (including washing, rinsing, drying)?: A Lot Help from another person to put on and taking off regular upper body clothing?: A Lot Help from another person to put on and taking off regular lower body clothing?: A Lot 6 Click Score: 14    End of Session Equipment Utilized During Treatment: Gait belt  OT Visit Diagnosis:  Muscle weakness (generalized) (M62.81);Low vision, both eyes (H54.2);Other symptoms and signs involving the nervous system (R29.898);Other symptoms and signs involving cognitive function   Activity Tolerance Patient tolerated treatment well   Patient Left in chair;with call bell/phone within reach;with family/visitor present (in wheelchair with RN present preparing for transfer to floor)   Nurse Communication Mobility status        Time: 1510-1537 OT Time Calculation (min): 27 min  Charges: OT General Charges $OT Visit: 1 Visit OT Treatments $Self Care/Home Management : 8-22 mins  Doristine Sectionharity A Kniyah Khun, MS OTR/L  Pager: [phone]    Jetaun Colbath A Boniface Goffe 04/28/2017, 5:29 PM

## 2017-04-28 NOTE — Telephone Encounter (Signed)
Pt currently admitted.

## 2017-04-28 NOTE — Progress Notes (Signed)
  Speech Language Pathology Treatment: Dysphagia  Patient Details Name: [name] MRN: [id number] DOB: 29-Apr-1946 Today's Date: 04/28/2017 Time: [id number] SLP Time Calculation (min) (ACUTE ONLY): 23 min  Assessment / Plan / Recommendation Clinical Impression  Pt's respirations hover primarily in the high 20s today, and he and his daughter believe that his vocal quality is improved. Although it does not sound hoarse, they say that it is still softer than at baseline and he subjectively describes soreness in his throat. Pt consumed large, sequential straw sips of water with no overt signs of aspiration or dysphagia. An immediate cough was observed x1 as pt attempted a dry cracker. Recommend initiation of Dys 3 diet and thin liquids, continuing pills whole in puree, as he still seems to have some irritation s/p extubation. Will f/u for tolerance and advancement of PO diet as well as for completion of cognitive-linguistic evaluation.   HPI HPI: 71 yo male developed shortness of breath, facial droop, Lt side weakness. 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 Given tPA, intubated for airway protection, and had thrombectomy of Rt M1. Developed Rt leg ischemia after procedure likely from embolic event. PMHx of CAD, ischemic CM, HTN, DM, HLD, CVA. Intubated 9/7-9/9.      SLP Plan  Continue with current plan of care       Recommendations  Diet recommendations: Dysphagia 3 (mechanical soft);Thin liquid Liquids provided via: Cup;Straw Medication Administration: Whole meds with puree Supervision: Patient able to self feed;Intermittent supervision to cue for compensatory strategies Compensations: Slow rate;Small sips/bites Postural Changes and/or Swallow Maneuvers: Seated upright 90 degrees                Oral Care Recommendations: Oral care BID Follow up Recommendations: Other (comment) (none anticipated for swallow; SLE pending) SLP Visit Diagnosis: Dysphagia,  unspecified (R13.10) Plan: Continue with current plan of care       GO                [name], [name] 04/28/2017, 10:37 AM  Devin HamLaura Sanchez, M.A. CCC-SLP [phone]

## 2017-04-28 NOTE — Progress Notes (Signed)
ANTICOAGULATION CONSULT NOTE  Pharmacy Consult for heparin Indication: stroke and SFA thrombus  No Known Allergies  Patient Measurements: Height: 6' (182.9 cm) Weight: 204 lb 3.2 oz (92.6 kg) IBW/kg (Calculated) : 77.6 Heparin Dosing Weight: 87.7kg  Vital Signs: Temp: 98.9 F (37.2 C) (09/10 0029) Temp Source: Oral (09/10 0029) BP: 109/75 (09/10 0500) Pulse Rate: 80 (09/10 0500)  Labs:  Recent Labs  04/25/17 2316 04/26/17 0530  04/27/17 0312 04/27/17 1200 04/28/17 0436  HGB 10.5* 10.4*  --  9.6*  --  9.8*  HCT 28.5* 28.7*  --  26.9*  --  27.5*  PLT 194 194  --  166  --  181  HEPARINUNFRC  --   --   < > 0.31 0.38 0.20*  CREATININE 0.87 0.87  --  0.90  --  1.00  TROPONINI 0.21* 0.24*  --   --   --   --   < > = values in this interval not displayed.  Estimated Creatinine Clearance: 75.4 mL/min (by C-G formula based on SCr of 1 mg/dL).  Medications:  Infusions:  . sodium chloride Stopped (04/28/17 0053)  . clevidipine Stopped (04/25/17 1100)  . heparin 1,150 Units/hr (04/28/17 0000)  . magnesium sulfate 1 - 4 g bolus IVPB      Assessment: 71 yom presented with an acute stroke and subsequent SFA thrombus. Received tPA 9/7 AM and now transitioned to IV heparin. CBC is stable. He is not on anticoagulation PTA and it has been >24 hours since tPA.  Heparin level subtherapeutic at 0.2 this AM with no infusion issues per RN. CBC stable and no bleeding.   Goal of Therapy:  Heparin level 0.3-0.5 units/ml Monitor platelets by anticoagulation protocol: Yes   Plan:  Increase heparin gtt to 1300 units/hr - NO BOLUS Heparin level in 8 hrs  Daily heparin level and CBC Monitor for s/s bleeding  York CeriseKatherine Cook, PharmD Clinical Pharmacist 04/28/17 6:29 AM

## 2017-04-28 NOTE — Progress Notes (Signed)
Progress Note  Patient Name: [name] Date of Encounter: 04/28/2017  Primary Cardiologist: Jens Som  Subjective   Extubated, looks relaxed and calm, very mildly tachypneic. Fluid balance net +3.6 L since admission. Weight is up 11 pounds since September 7. Complains of discomfort from urinary catheter, but apparently this was difficult to placed and required Urology consultation.  Inpatient Medications    Scheduled Meds: . aspirin EC  81 mg Oral Daily  . atorvastatin  80 mg Oral q1800  . docusate sodium  100 mg Oral Daily  . insulin aspart  0-9 Units Subcutaneous Q4H  . mouth rinse  15 mL Mouth Rinse BID  . pantoprazole (PROTONIX) IV  40 mg Intravenous Daily   Continuous Infusions: . sodium chloride Stopped (04/28/17 0053)  . clevidipine Stopped (04/25/17 1100)  . magnesium sulfate 1 - 4 g bolus IVPB     PRN Meds: acetaminophen, guaiFENesin-dextromethorphan, magnesium sulfate 1 - 4 g bolus IVPB, morphine injection, ondansetron (ZOFRAN) IV, phenol, potassium chloride   Vital Signs    Vitals:   04/28/17 0730 04/28/17 0800 04/28/17 0900 04/28/17 1000  BP:  108/75 123/68   Pulse:  78 (!) 34 84  Resp:  (!) 23 (!) 28 (!) 32  Temp: 98.1 F (36.7 C)     TempSrc: Oral     SpO2:  100% 100% 99%  Weight:      Height:        Intake/Output Summary (Last 24 hours) at 04/28/17 1036 Last data filed at 04/28/17 1000  Gross per 24 hour  Intake           727.75 ml  Output             1560 ml  Net          -832.25 ml   Filed Weights   04/25/17 0515 04/26/17 0500 04/27/17 0446  Weight: 193 lb 4.8 oz (87.7 kg) 195 lb (88.5 kg) 204 lb 3.2 oz (92.6 kg)    Telemetry    Sinus rhythm with frequent PVCs, occasional ventricular couplets, no ventricular tachycardia and no atrial fibrillation - Personally Reviewed  ECG    Normal sinus rhythm, nonspecific intraventricular conduction delay (QRS 120 ms), ST depression in the lateral leads - Personally Reviewed  Physical Exam    Calm, relaxed, sitting up in bed GEN: No acute distress.   Neck: No JVD Cardiac: RRR, no murmurs, rubs, or gallops. Laterally displaced apical impulse. Respiratory: Clear to auscultation bilaterally. GI: Soft, nontender, non-distended  MS: No edema; No deformity. Has sequential compression devices on. Neuro:   mild left upper extremity hemiparesis  Psych: Normal affect   Labs    Chemistry Recent Labs Lab 04/25/17 1226 04/25/17 2316 04/26/17 0530 04/27/17 0312 04/28/17 0436  NA 128* 131* 129* 129* 133*  K 4.8 4.3 4.2 3.9 3.9  CL 101 105 103 104 107  CO2 18* 19* 18* 16* 18*  GLUCOSE 246* 131* 131* 136* 141*  BUN CREATININE 1.06 0.87 0.87 0.90 1.00  CALCIUM 8.2* 8.3* 8.3* 8.0* 8.6*  PROT  --  5.4*  --   --   --   ALBUMIN 3.2* 3.0*  --   --   --   AST  --  17  --   --   --   ALT  --  15*  --   --   --   ALKPHOS  --  50  --   --   --  BILITOT  --  0.4  --   --   --   GFRNONAA >60 >60 >60 >60 >60  GFRAA >60 >60 >60 >60 >60  ANIONGAP 9 7 8 9 8      Hematology Recent Labs Lab 04/26/17 0530 04/27/17 0312 04/28/17 0436  WBC 8.5 12.2* 11.1*  RBC 4.12* 3.85* 3.94*  HGB 10.4* 9.6* 9.8*  HCT 28.7* 26.9* 27.5*  MCV 69.7* 69.9* 69.8*  MCH 25.2* 24.9* 24.9*  MCHC 36.2* 35.7 35.6  RDW 14.2 14.3 14.4  PLT 194 166 181    Cardiac Enzymes Recent Labs Lab 04/24/17 1000 04/25/17 2316 04/26/17 0530  TROPONINI 0.05* 0.21* 0.24*    Recent Labs Lab 04/24/17 1012  TROPIPOC 0.11*     BNP Recent Labs Lab 04/24/17 1000  BNP 832.8*     DDimer  Recent Labs Lab 04/24/17 1000  DDIMER 0.92*     Radiology    Dg Chest Port 1 View  Result Date: 04/27/2017 CLINICAL DATA:  Respiratory failure. EXAM: PORTABLE CHEST 1 VIEW COMPARISON:  Radiograph of April 26, 2017. FINDINGS: Stable cardiomegaly. Stable position of endotracheal and nasogastric tubes. No pneumothorax is noted. Increased bibasilar opacities are noted concerning for worsening edema or  atelectasis with associated pleural effusions. Bony thorax is unremarkable. IMPRESSION: Stable support apparatus. Increased bibasilar edema or atelectasis is noted with associated pleural effusions. Electronically Signed   By: Lupita RaiderJames  Green Jr, M.D.   On: 04/27/2017 07:54    Cardiac Studies   04/18/2017 ECHO Study Conclusions - Left ventricle: The cavity size was moderately dilated. Wall thickness was normal. Systolic function was severely reduced. The estimated ejection fraction was in the range of 20% to 25%. - Aortic valve: There was mild regurgitation. - Mitral valve: There was mild regurgitation.   Patient Profile     71 y.o. male with severe ischemic cardiopathy (EF 20-25%), complicated by acute pulmonary edema, acute hypoxic respiratory failure requiring intubation, hyponatremia, subsequently with acute ischemic R MCA stroke and acute right SFA-popliteal occlusion, requiring thrombectomy and thrombolytics. Now 4 days following suspected embolic event, extubated, improved blood pressure.  Assessment & Plan    1. Acute on chronic systolic and diastolic heart failure: He has severe ischemic cardiomyopathy. No real evidence for new acute ischemic injury, rather persistent fluid overload following [place]. Judging by in/out balance and weight he is even more hypovolemic at this time and will require diuretics. He weighed 180 pounds when he saw Dr. Jens Somrenshaw in the office on August 30 and was presumed to be hypervolemic at that time. That would suggest that he is now >24 pounds above his "dry weight". Intravenous furosemide started. We are now 4 days following his embolic stroke, he has good neurological recovery and improved blood pressure, maintaining a higher systolic blood pressure at this point is no longer a priority. We'll resume ARB, add low-dose Aldactone. While using intravenous loop diuretics will require potassium supplements, but these should be  stopped at the time of discharge. Continue to hold carvedilol until he is closer to euvolemic.  Reevaluate left ventricular ejection fraction after optimization of heart failure meds. May need defibrillator. 2. CAD s/p CABG: He has not had angina pectoris. Nuclear stress test performed just a few days ago showed an extensive inferior and inferolateral scar and EF under 30%. Doubt acute coronary event. 3. Acute embolic right MCA stroke: Thankfully seems to have only mild left upper extremity hemiparesis, expected to improve over the next  several days-weeks. Although there was no evidence of left ventricular thrombus at the time of echocardiography, this is almost certainly the cause of synchronous stroke and embolic occlusion of the right popliteal artery. He will require chronic anticoagulation. At this point the most appropriate agent appears to be warfarin. I'm not sure about the availability of a direct oral anticoagulants when he returns to [place] and we also have less data on the use of a novel agents for left ventricular thrombus. 4. R popliteal acute embolic occlusion: Appears to have good pulses and perfusion of the right lower extremity. 5. Hyponatremia: Improving, almost normal 6. HLP: Target LDL <70, was 93 recently. Atorvastatin 80 mg daily was just started. 7. DM: Poorly controlled, most recent hemoglobin A1c 9.4%. Recently started on Lantus and given a glucometer.  For questions or updates, please contact [facility] Please consult [email or web address] for contact info under Cardiology/STEMI. Daytime calls, contact the Day Call APP (6a-8a) or assigned team (Teams A-D) provider (7:30a - 5p). All other daytime calls (7:30-5p), contact the Card Master @ [phone].   Nighttime calls, contact the assigned APP (5p-8p) or MD (6:30p-8p). Overnight calls (8p-6a), contact the on call Fellow @ [phone].      Signed, Thurmon Fair, MD  04/28/2017, 10:36 AM

## 2017-04-28 NOTE — Progress Notes (Signed)
ANTICOAGULATION CONSULT NOTE  Pharmacy Consult for heparin Indication: stroke and SFA thrombus  No Known Allergies  Patient Measurements: Height: 6' (182.9 cm) Weight: 204 lb 3.2 oz (92.6 kg) IBW/kg (Calculated) : 77.6 Heparin Dosing Weight: 87.7kg  Vital Signs: Temp: 98.4 F (36.9 C) (09/10 1558) Temp Source: Oral (09/10 1558) BP: 115/75 (09/10 1700) Pulse Rate: 80 (09/10 1700)  Labs:  Recent Labs  04/25/17 2316 04/26/17 0530  04/27/17 0312 04/27/17 1200 04/28/17 0436 04/28/17 1754  HGB 10.5* 10.4*  --  9.6*  --  9.8*  --   HCT 28.5* 28.7*  --  26.9*  --  27.5*  --   PLT 194 194  --  166  --  181  --   HEPARINUNFRC  --   --   < > 0.31 0.38 0.20* 0.28*  CREATININE 0.87 0.87  --  0.90  --  1.00  --   TROPONINI 0.21* 0.24*  --   --   --   --   --   < > = values in this interval not displayed.  Estimated Creatinine Clearance: 75.4 mL/min (by C-G formula based on SCr of 1 mg/dL).  Medications:  Infusions:  . sodium chloride Stopped (04/28/17 0053)  . clevidipine Stopped (04/25/17 1100)  . heparin 1,300 Units/hr (04/28/17 1204)  . magnesium sulfate 1 - 4 g bolus IVPB      Assessment: 71 yom presented with an acute stroke and subsequent SFA thrombus. Received tPA 9/7 AM and now transitioned to IV heparin. He is not on anticoagulation PTA and it has been >24 hours since tPA. -heparin level is 0.28 on 1300 units/hr  Goal of Therapy:  Heparin level 0.3-0.5 units/ml Monitor platelets by anticoagulation protocol: Yes   Plan:  Increase heparin to 1400 units/hr Daily HL CBC INR  Harland GermanAndrew Nicki Furlan, Pharm D 04/28/2017 6:52 PM

## 2017-04-28 NOTE — Progress Notes (Signed)
[facility] and pt transferred to 2C09. VS WNL.

## 2017-04-28 NOTE — Progress Notes (Addendum)
Nutrition Follow Up Note  DOCUMENTATION CODES:   Not applicable  INTERVENTION:   Premier Protein BID, each supplement provides 160kcal and 30g protein.   NUTRITION DIAGNOSIS:   Inadequate oral intake related to inability to eat as evidenced by NPO status.  -Pt advanced to Dysphagia 3 diet today  GOAL:   Patient will meet greater than or equal to 90% of their needs  MONITOR:   PO intake, Supplement acceptance, Labs, Weight trends  ASSESSMENT:   71 yo male developed shortness of breath, facial droop, Lt side weakness.  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  Given tPA, intubated for airway protection, and had thrombectomy of Rt M1.  Developed Rt leg ischemia after procedure likely from embolic event.   Pt does not speak [place]. History obtained from family member at bedside. Met with pt and family member in room today. Per pt's family member, pt has not had a good appetite for the past 6 months. She reports that pt does still eat but only about half of what he used to eat. Family member does report that pt appears to be weight stable; there is no weight history in pt's chart. Pt with 17lb weight gain since admit; pt with volume overload. Pt up estimated +3.6L since admit. Pt extubated 9/9; tolerating well. Pt s/p CSE today and approved for DYS 3/thin liquid diet. Pt ate 25% of his meal today. RD recommends Ensure, but pt's family requesting a low sugar supplement. RD will order Premier Protein.   Medications reviewed and include: aspirin, colace, insulin, lasix, protonix, KCl, aldactone, warfarin, heparin  Labs reviewed: Ca 8.6(L) Wbc- 11.1(H), Hgb 9.8(L), Hct 27.5(L) cbgs- 131, 136, 141 x 48hrs AIC 8.8(H)  Diet Order:  DIET DYS 3 Room service appropriate? Yes; Fluid consistency: Thin  Skin:  Wound (see comment) (R leg incision, R 3rd toe DM ulcer)  Last BM:  9/10  Height:   Ht Readings from Last 1 Encounters:  04/25/17 6' (1.829 m)    Weight:    Wt Readings from Last 1 Encounters:  04/27/17 204 lb 3.2 oz (92.6 kg)    Ideal Body Weight:  80.91 kg  BMI:  Body mass index is 27.69 kg/m.  Estimated Nutritional Needs:   Kcal:  2100-2400kcal/day   Protein:  102-120g/day   Fluid:  >2L/day   EDUCATION NEEDS:   No education needs identified at this time  Koleen Distance MS, RD, [place] Pager #[phone] After Hours Pager: [phone]

## 2017-04-28 NOTE — Progress Notes (Signed)
Pt c/o catheter pain and Dr. Lenn Sinkahlsteadt with Urology made aware. Received verbal order to remove catheter.

## 2017-04-28 NOTE — Progress Notes (Signed)
Physical Therapy Treatment Patient Details Name: [name] MRN: [id number] DOB: 17-Jan-1946 Today's Date: 04/28/2017    History of Present Illness 71 y.o.malewith a history of MI and CABG 2007, DM type 2, HTN, stroke and HLD who presented for SOB possible CHF exacerbation. During hosptial course developed facial droop, Lt side weakness. Code stroke called, imaging CTA + Right MCA M1 embolic occlusion. Patient taken to IR and received TPA.    PT Comments    Patient progressing well towards PT goals. Tolerated standing bouts and initiated gait training today with Mod A of 2 for balance/safety. Pt with LLE weakness noted during gait with difficulty advancing and clearing left foot. Pt follows 1-2 step commands consistently (used friend to translate). Pt would be a great CIR candidate. Seems eager to improve mobility. VSS throughout with increase in RR for short period in standing to 40s but this improved with rest. Will follow and progress as tolerated.     Follow Up Recommendations  CIR;Supervision/Assistance - 24 hour     Equipment Recommendations  Other (comment) (TBA)    Recommendations for Other Services       Precautions / Restrictions Precautions Precautions: Fall Precaution Comments: left post op shoe from fractured toe  Restrictions Weight Bearing Restrictions: No    Mobility  Bed Mobility Overal bed mobility: Needs Assistance Bed Mobility: Rolling;Sidelying to Sit Rolling: Min assist Sidelying to sit: Mod assist;+2 for physical assistance;HOB elevated       General bed mobility comments: Cues for sequencing for rolling and to reach for rail; able to bring LEs to EOB, assist with trunk and scooting bottom to EOB. no dizziness. Posterior bias EOB.  Transfers Overall transfer level: Needs assistance Equipment used:  (back of recliner chair) Transfers: Sit to/from Stand Sit to Stand: Min assist         General transfer comment: Assist to power to standing  pulling up on back of recliner chair. Left lateral lean in standing.  Ambulation/Gait Ambulation/Gait assistance: Mod assist;+2 physical assistance Ambulation Distance (Feet): 12 Feet Assistive device:  (back of recliner) Gait Pattern/deviations: Decreased step length - left;Step-to pattern;Step-through pattern;Decreased dorsiflexion - left;Narrow base of support;Trunk flexed Gait velocity: decreased Gait velocity interpretation: Below normal speed for age/gender General Gait Details: Slow, unsteady gait with difficulty progressing LLE during swing phase and decreased foot clearance; Pt with left lateral lean. RR in early 640s.   Stairs            Wheelchair Mobility    Modified Rankin (Stroke Patients Only) Modified Rankin (Stroke Patients Only) Pre-Morbid Rankin Score: No symptoms Modified Rankin: Moderately severe disability     Balance Overall balance assessment: Needs assistance Sitting-balance support: Feet unsupported;Bilateral upper extremity supported Sitting balance-Leahy Scale: Poor Sitting balance - Comments: Pt with posterior pelvic tilt and lean sitting EOB needing Min guard-Mod A for balance. When fatigued, pt with LOB posteriorly and to left. Able to initiate trunk for anterior weight shift and lean but not sustain. Postural control: Posterior lean;Left lateral lean Standing balance support: During functional activity;Bilateral upper extremity supported Standing balance-Leahy Scale: Poor Standing balance comment: Requires UE support in standing on back of recliner with left lateral lean. Min A for static standing balance but Mod A for dynamic standing balance washing face.                             Cognition Arousal/Alertness: Awake/alert Behavior During Therapy: WFL for tasks assessed/performed Overall Cognitive  Status: Difficult to assess                         Following Commands: Follows one step commands consistently;Follows one  step commands with increased time       General Comments: Seems WFL for basic tasks. Friend translated during session. Follows 1-2 step commands consistently.       Exercises      General Comments General comments (skin integrity, edema, etc.): Friend present during session providing translation. Sp02 remained >92% on RA.       Pertinent Vitals/Pain Pain Assessment: No/denies pain    Home Living                      Prior Function            PT Goals (current goals can now be found in the care plan section) Progress towards PT goals: Progressing toward goals    Frequency    Min 4X/week      PT Plan Current plan remains appropriate    Co-evaluation PT/OT/SLP Co-Evaluation/Treatment: Yes Reason for Co-Treatment: Complexity of the patient's impairments (multi-system involvement);For patient/therapist safety;To address functional/ADL transfers PT goals addressed during session: Mobility/safety with mobility;Strengthening/ROM;Balance        AM-PAC PT "6 Clicks" Daily Activity  Outcome Measure  Difficulty turning over in bed (including adjusting bedclothes, sheets and blankets)?: Unable Difficulty moving from lying on back to sitting on the side of the bed? : Unable Difficulty sitting down on and standing up from a chair with arms (e.g., wheelchair, bedside commode, etc,.)?: Unable Help needed moving to and from a bed to chair (including a wheelchair)?: A Lot Help needed walking in hospital room?: A Lot Help needed climbing 3-5 steps with a railing? : A Lot 6 Click Score: 9    End of Session Equipment Utilized During Treatment: Gait belt Activity Tolerance: Patient tolerated treatment well Patient left: in chair;with call bell/phone within reach;with family/visitor present;with nursing/sitter in room;Other (comment) (in w/c to be transferred to new room) Nurse Communication: Mobility status PT Visit Diagnosis: Difficulty in walking, not elsewhere  classified (R26.2);Other symptoms and signs involving the nervous system (R29.898)     Time: 1510-1535 PT Time Calculation (min) (ACUTE ONLY): 25 min  Charges:  $Therapeutic Activity: 8-22 mins                    G Codes:       Mylo Red, PT, DPT [phone]     Blake Divine A Djuna Frechette 04/28/2017, 3:54 PM

## 2017-04-29 DIAGNOSIS — I6522 Occlusion and stenosis of left carotid artery: Secondary | ICD-10-CM

## 2017-04-29 DIAGNOSIS — R269 Unspecified abnormalities of gait and mobility: Secondary | ICD-10-CM

## 2017-04-29 LAB — BASIC METABOLIC PANEL
ANION GAP: 10 (ref 5–15)
BUN: 20 mg/dL (ref 6–20)
CALCIUM: 8.9 mg/dL (ref 8.9–10.3)
CO2: 20 mmol/L — ABNORMAL LOW (ref 22–32)
CREATININE: 0.97 mg/dL (ref 0.61–1.24)
Chloride: 104 mmol/L (ref 101–111)
GFR calc Af Amer: >60 mL/min (ref >60)
GLUCOSE: 191 mg/dL — AB (ref 65–99)
Potassium: 3.5 mmol/L (ref 3.5–5.1)
Sodium: 134 mmol/L — ABNORMAL LOW (ref 135–145)

## 2017-04-29 LAB — PROTIME-INR
INR: 1.26
Prothrombin Time: 15.7 seconds — ABNORMAL HIGH (ref 11.4–15.2)

## 2017-04-29 LAB — CBC
HCT: 30 % — ABNORMAL LOW (ref 39.0–52.0)
HEMOGLOBIN: 10.7 g/dL — AB (ref 13.0–17.0)
MCH: 24.8 pg — ABNORMAL LOW (ref 26.0–34.0)
MCHC: 35.7 g/dL (ref 30.0–36.0)
MCV: 69.4 fL — ABNORMAL LOW (ref 78.0–100.0)
PLATELETS: 200 10*3/uL (ref 150–400)
RBC: 4.32 MIL/uL (ref 4.22–5.81)
RDW: 14.3 % (ref 11.5–15.5)
WBC: 10 10*3/uL (ref 4.0–10.5)

## 2017-04-29 LAB — GLUCOSE, CAPILLARY
GLUCOSE-CAPILLARY: 232 mg/dL — AB (ref 65–99)
GLUCOSE-CAPILLARY: 142 mg/dL — AB (ref 65–99)
GLUCOSE-CAPILLARY: 160 mg/dL — AB (ref 65–99)
GLUCOSE-CAPILLARY: 224 mg/dL — AB (ref 65–99)
Glucose-Capillary: 156 mg/dL — ABNORMAL HIGH (ref 65–99)
Glucose-Capillary: 200 mg/dL — ABNORMAL HIGH (ref 65–99)
Glucose-Capillary: 187 mg/dL — ABNORMAL HIGH (ref 65–99)

## 2017-04-29 LAB — HEPARIN LEVEL (UNFRACTIONATED): HEPARIN UNFRACTIONATED: 0.36 [IU]/mL (ref 0.30–0.70)

## 2017-04-29 MED ORDER — WARFARIN SODIUM 5 MG PO TABS: 5.0000 mg | ORAL_TABLET | Freq: Once | ORAL | Status: AC

## 2017-04-29 MED ORDER — WARFARIN VIDEO: Freq: Once | Status: DC

## 2017-04-29 MED ORDER — WARFARIN SODIUM 5 MG PO TABS
5.0000 mg | ORAL_TABLET | Freq: Once | ORAL | Status: AC
  Administered 2017-04-29: 5 mg via ORAL
  Filled 2017-04-29: qty 1

## 2017-04-29 MED ORDER — COUMADIN BOOK
Freq: Once | Status: AC
  Administered 2017-04-29: 09:00:00
  Filled 2017-04-29: qty 1

## 2017-04-29 MED ORDER — FUROSEMIDE 10 MG/ML IJ SOLN
80.0000 mg | Freq: Two times a day (BID) | INTRAMUSCULAR | Status: DC
  Administered 2017-04-29 – 2017-05-04 (×10): 80 mg via INTRAVENOUS
  Filled 2017-04-29 (×11): qty 8

## 2017-04-29 NOTE — Progress Notes (Signed)
We await inpt rehab consult order to assess if pt would be a candidate for CIR admit. I paged Dr. Royann Shiversroitoru for order. [phone]

## 2017-04-29 NOTE — Consult Note (Signed)
Physical Medicine and Rehabilitation Consult Reason for Consult: Left side weakness Referring Physician: Dr. Marlou Porch   HPI: [name] is a 71 y.o. right handed limited English-speaking male with history of CAD/CABG 2007 in [place], diabetes mellitus, systolic congestive heart failure, hypertension, CVA. Patient lives with family. Independent with a cane prior to admission. One level home with 4 steps to entry. Presented 04/24/2017 with increasing shortness of breath as well as ischemic right leg. Patient did require intubation. Chest x-ray cardiac enlargement without failure. CT of the head showed small focus of hypoattenuation within the right insula compatible with acute infarction. 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 CT angiogram showed occlusion of the right SFA just after the origin. Underwent right femoral-popliteal thromboembolectomy, right below the knee popliteal artery endarterectomy 04/25/2017 per Dr. Bridgett Larsson. Postoperatively a Foley catheter tube was placed with findings of bladder neck contracture urology consulted underwent cystoscopy for placement of Foley tube. Postoperatively noted left-sided weakness with neurology consulted and MRI showed faint ischemic changes involving the right insula out and basal ganglia. Echocardiogram with ejection fraction of 25% diffuse hypokinesis. No cardiac source of emboli. Acute on chronic anemia 9.6-10.7. Patient currently maintained on heparin as well as Coumadin therapy. Physical therapy evaluation completed and ongoing with recommendations of physical medicine rehabilitation consult.   Review of Systems  Unable to perform ROS: Language  All other systems reviewed and are negative.  Past Medical History:  Diagnosis Date  . CAD (coronary artery disease) 2007   CABG 2007 in [place]  . CHF (congestive heart failure) ([place])   . Diabetes mellitus without  complication ([place])   . Hyperlipidemia   . Hypertension   . Pulmonary embolism ([place])   . Stroke [facility])    Past Surgical History:  Procedure Laterality Date  . CARDIAC SURGERY    . CYSTOSCOPY N/A 04/25/2017   Procedure: CYSTOSCOPY FLEXIBLE;  Surgeon: Conrad [place], MD;  Location: [place];  Service: Vascular;  Laterality: N/A;  . EMBOLECTOMY Right 04/25/2017   Procedure: EMBOLECTOMY RIGHT FEMORAL ARTERY;  Surgeon: Conrad [place], MD;  Location: [place];  Service: Vascular;  Laterality: Right;  . IR PERCUTANEOUS ART THROMBECTOMY/INFUSION INTRACRANIAL INC DIAG ANGIO  04/25/2017  . PATCH ANGIOPLASTY Right 04/25/2017   Procedure: PATCH ANGIOPLASTY USING [place];  Surgeon: Conrad [place], MD;  Location: [place];  Service: Vascular;  Laterality: Right;  . PROSTATE SURGERY  2002  . RADIOLOGY WITH ANESTHESIA N/A 04/24/2017   Procedure: RADIOLOGY WITH ANESTHESIA Code Stroke;  Surgeon: Corrie Mckusick, DO;  Location: [place];  Service: Anesthesiology;  Laterality: N/A;   Family History  Problem Relation Age of Onset  . Hypertension Mother   . Hypertension Sister    Social History:  reports that he has never smoked. He has never used smokeless tobacco. He reports that he does not drink alcohol or use drugs. Allergies: No Known Allergies Medications Prior to Admission  Medication Sig Dispense Refill  . acetaminophen (TYLENOL) 650 MG CR tablet Take 650 mg by mouth at bedtime. FOR LEG PAIN    . aspirin EC 325 MG tablet Take 1 tablet (325 mg total) by mouth daily. 30 tablet 11  . atorvastatin (LIPITOR) 80 MG tablet Take 1 tablet (80 mg total) by mouth daily at 6 PM. 30 tablet 11  . carvedilol (COREG) 3.125 MG tablet Take 1 tablet (3.125 mg total) by mouth 2 (two) times daily with  a meal. 60 tablet 11  . feeding supplement, GLUCERNA SHAKE, (GLUCERNA SHAKE) LIQD Take 237 mLs by mouth 2 (two) times daily between meals.    . furosemide (LASIX) 20 MG tablet Take 1 tablet (20 mg total) by mouth daily. 30 tablet 6   . insulin glargine (LANTUS) 100 UNIT/ML injection Inject 0.05 mLs (5 Units total) into the skin at bedtime. 10 mL 11  . losartan (COZAAR) 25 MG tablet Take 1 tablet (25 mg total) by mouth daily. 30 tablet 11  . meloxicam (MOBIC) 7.5 MG tablet Take 2 tablets (15 mg total) by mouth daily. (Patient taking differently: Take 7.5 mg by mouth 2 (two) times daily. ) 30 tablet 0  . metFORMIN (GLUCOPHAGE) 1000 MG tablet Take 1 tablet (1,000 mg total) by mouth daily with breakfast. 30 tablet 11  . Blood Glucose Monitoring Suppl (TRUE METRIX METER) DEVI 1 kit by Does not apply route 4 (four) times daily. 1 Device 0  . glucose blood (TRUE METRIX BLOOD GLUCOSE TEST) test strip Use as instructed 100 each 12  . TRUEPLUS LANCETS 28G MISC 28 g by Does not apply route 4 (four) times daily. 120 each 11    Home: Home Living Family/patient expects to be discharged to:: Private residence Living Arrangements: Children Available Help at Discharge: Family, Available 24 hours/day Type of Home: House Home Access: Stairs to enter Technical brewer of Steps: 4 Home Layout: One level Bathroom Shower/Tub: Chiropodist: Standard Home Equipment: Cane - single point  Functional History: Prior Function Level of Independence: Independent with assistive device(s) Comments: used SPC for ambulation Functional Status:  Mobility: Bed Mobility Overal bed mobility: Needs Assistance Bed Mobility: Rolling, Sidelying to Sit Rolling: Min assist Sidelying to sit: Mod assist, +2 for physical assistance, HOB elevated Supine to sit: Mod assist, +2 for physical assistance, HOB elevated Sit to supine: Mod assist, +2 for physical assistance, HOB elevated General bed mobility comments: pt OOB in chair upon arrival Transfers Overall transfer level: Needs assistance Equipment used: Rolling walker (2 wheeled) Transfers: Sit to/from Stand Sit to Stand: Min assist General transfer comment: assist to power up  and gain balance upon standing  Ambulation/Gait Ambulation/Gait assistance: Mod assist, +2 safety/equipment (+2 for chair follow) Ambulation Distance (Feet):  (21f then 129f Assistive device: Rolling walker (2 wheeled) Gait Pattern/deviations: Decreased step length - left, Step-to pattern, Step-through pattern, Decreased dorsiflexion - left, Trunk flexed General Gait Details: multimodal cues for posture, weight shifting, and tactile cues for L LE advancement and vc for heel strike; pt with difficulty advancing L LE especially when becoming fatigued and required seated rest break Gait velocity: decreased Gait velocity interpretation: Below normal speed for age/gender    ADL: ADL Overall ADL's : Needs assistance/impaired Grooming: Minimal assistance, Sitting Grooming Details (indicate cue type and reason): Min assist for sitting balance. VC's to initiate grooming tasks.  Upper Body Dressing : Moderate assistance Upper Body Dressing Details (indicate cue type and reason): To change gown due to bed wet on arrival.  Lower Body Dressing: Total assistance Toilet Transfer: Minimal assistance, Moderate assistance, +2 for physical assistance Toilet Transfer Details (indicate cue type and reason): Able to power up into standing with min assist +2 and requiring mod assist +2 to maintain balance. Able to ambulate pushing recliner for UE support with min assist +2. Functional mobility during ADLs: Moderate assistance, +2 for physical assistance General ADL Comments: Pt highly motivated to participate in ADL.   Cognition: Cognition Overall Cognitive Status: Difficult to  assess Orientation Level: Oriented X4 Cognition Arousal/Alertness: Awake/alert Behavior During Therapy: WFL for tasks assessed/performed Overall Cognitive Status: Difficult to assess Area of Impairment: Attention, Problem solving Current Attention Level: Selective Following Commands: Follows one step commands  consistently Problem Solving: Requires verbal cues General Comments: daughter translated; pt a little impulsive with ambulating however pt does appear to understand his deficits Difficult to assess due to: Non-English speaking  Blood pressure 116/65, pulse 80, temperature 98.2 F (36.8 C), temperature source Oral, resp. rate (!) 26, height 6' (1.829 m), weight 90 kg (198 lb 6.6 oz), SpO2 98 %. Physical Exam  HENT:  Head: Normocephalic.  Eyes: EOM are normal.  Neck: Normal range of motion. Neck supple. No thyromegaly present.  Cardiovascular:  Cardiac rate controlled  Respiratory: Effort normal and breath sounds normal. No respiratory distress.  GI: Soft. Bowel sounds are normal. He exhibits distension.  Neurological: He is alert.  Patient makes eye contact with examiner. Limited English-speaking. Follows simple commands. Mild left sided weakness. RUE 5/5. LUE 4/5 prox to distal. RLE slightly limited by incision/pain. LLE: 4/5 prox to distal. Senses pain in all 4's.   Skin: Skin is warm and dry.    Results for orders placed or performed during the hospital encounter of 04/24/17 (from the past 24 hour(s))  Glucose, capillary     Status: Abnormal   Collection Time: 04/28/17  4:56 PM  Result Value Ref Range   Glucose-Capillary 212 (H) 65 - 99 mg/dL   Comment 1 Notify RN    Comment 2 Document in Chart   Heparin level (unfractionated)     Status: Abnormal   Collection Time: 04/28/17  5:54 PM  Result Value Ref Range   Heparin Unfractionated 0.28 (L) 0.30 - 0.70 IU/mL  Glucose, capillary     Status: Abnormal   Collection Time: 04/28/17  7:21 PM  Result Value Ref Range   Glucose-Capillary 223 (H) 65 - 99 mg/dL  Glucose, capillary     Status: Abnormal   Collection Time: 04/28/17 11:07 PM  Result Value Ref Range   Glucose-Capillary 185 (H) 65 - 99 mg/dL  Basic metabolic panel     Status: Abnormal   Collection Time: 04/29/17  1:32 AM  Result Value Ref Range   Sodium 134 (L) 135 - 145  mmol/L   Potassium 3.5 3.5 - 5.1 mmol/L   Chloride 104 101 - 111 mmol/L   CO2 20 (L) 22 - 32 mmol/L   Glucose, Bld 191 (H) 65 - 99 mg/dL   BUN 20 6 - 20 mg/dL   Creatinine, Ser 0.97 0.61 - 1.24 mg/dL   Calcium 8.9 8.9 - 10.3 mg/dL   GFR calc non Af Amer >60 >60 mL/min   GFR calc Af Amer >60 >60 mL/min   Anion gap 10 5 - 15  CBC     Status: Abnormal   Collection Time: 04/29/17  1:32 AM  Result Value Ref Range   WBC 10.0 4.0 - 10.5 K/uL   RBC 4.32 4.22 - 5.81 MIL/uL   Hemoglobin 10.7 (L) 13.0 - 17.0 g/dL   HCT 30.0 (L) 39.0 - 52.0 %   MCV 69.4 (L) 78.0 - 100.0 fL   MCH 24.8 (L) 26.0 - 34.0 pg   MCHC 35.7 30.0 - 36.0 g/dL   RDW 14.3 11.5 - 15.5 %   Platelets 200 150 - 400 K/uL  Heparin level (unfractionated)     Status: None   Collection Time: 04/29/17  1:32 AM  Result Value  Ref Range   Heparin Unfractionated 0.36 0.30 - 0.70 IU/mL  Protime-INR     Status: Abnormal   Collection Time: 04/29/17  1:32 AM  Result Value Ref Range   Prothrombin Time 15.7 (H) 11.4 - 15.2 seconds   INR 1.26   Glucose, capillary     Status: Abnormal   Collection Time: 04/29/17  3:07 AM  Result Value Ref Range   Glucose-Capillary 156 (H) 65 - 99 mg/dL  Glucose, capillary     Status: Abnormal   Collection Time: 04/29/17  7:55 AM  Result Value Ref Range   Glucose-Capillary 142 (H) 65 - 99 mg/dL   Comment 1 Document in Chart   Glucose, capillary     Status: Abnormal   Collection Time: 04/29/17 11:35 AM  Result Value Ref Range   Glucose-Capillary 160 (H) 65 - 99 mg/dL   Comment 1 Document in Chart    Mr Brain Wo Contrast  Result Date: 04/28/2017 CLINICAL DATA:  Status post mechanical thrombectomy RIGHT M1 occlusion. Focal neurological deficit, suspect stroke. EXAM: MRI HEAD WITHOUT CONTRAST TECHNIQUE: Multiplanar, multiecho pulse sequences of the brain and surrounding structures were obtained without intravenous contrast. COMPARISON:  CT HEAD April 26, 2017 and MRI of the head April 25, 2017  FINDINGS: BRAIN: New punctate focus reduced diffusion LEFT frontal cortex (axial diffusion weighted imaging 44/100) Reduced diffusion at RIGHT insula and posterior lenticulostriate nucleus unchanged distribution for prior imaging. Punctate focus reduced diffusion LEFT frontal and LEFT occipital lobes are unchanged. No further propagation. Normalizing ADC values. No new infarcts. No susceptibility artifact to suggest hemorrhage. A few scattered supratentorial chronic micro hemorrhages associated with chronic hypertension. Old RIGHT thalamus lacunar infarct with mineralization. Patchy to confluent supratentorial white matter FLAIR T2 hyperintensities. Old small cerebellar infarcts. Ventricles and sulci are overall normal for patient's age. No midline shift, mass effect or masses. No abnormal extra-axial fluid collections. VASCULAR: Normal major intracranial vascular flow voids present at skull base. SKULL AND UPPER CERVICAL SPINE: No abnormal sellar expansion. No suspicious calvarial bone marrow signal. Craniocervical junction maintained. SINUSES/ORBITS: LEFT maxillary mucosal retention cyst. Trace paranasal sinus mucosal thickening. Trace LEFT mastoid effusion. The included ocular globes and orbital contents are non-suspicious. OTHER: None. IMPRESSION: 1. New, acute punctate LEFT frontal lobe infarct. 2. Evolving acute to subacute RIGHT MCA territory and punctate LEFT frontal and occipital infarcts without further propagation, new infarcts or hemorrhagic conversion. 3. 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 Old RIGHT thalamus and small cerebellar infarcts. Electronically Signed   By: Elon Alas M.D.   On: 04/28/2017 23:09    Assessment/Plan: Diagnosis: right insular infarct, right lower ext thromboembolectomy/edarterectomy for ischemic occlusion 1. Does the need for close, 24 hr/day medical supervision in concert with the patient's rehab needs make it unreasonable for this patient to be  served in a less intensive setting? Yes 2. Co-Morbidities requiring supervision/potential complications: chf, wound care, pain mgt 3. Due to bladder management, bowel management, safety, skin/wound care, disease management, medication administration, pain management and patient education, does the patient require 24 hr/day rehab nursing? Yes 4. Does the patient require coordinated care of a physician, rehab nurse, PT (1-2 hrs/day, 5 days/week) and OT (1-2 hrs/day, 5 days/week) to address physical and functional deficits in the context of the above medical diagnosis(es)? Yes Addressing deficits in the following areas: balance, endurance, locomotion, strength, transferring, bowel/bladder control, bathing, dressing, feeding, grooming, toileting and psychosocial support 5. Can the patient actively participate in an intensive therapy program of at least  3 hrs of therapy per day at least 5 days per week? Yes 6. The potential for patient to make measurable gains while on inpatient rehab is excellent 7. Anticipated functional outcomes upon discharge from inpatient rehab are modified independent  with PT, modified independent and supervision with OT, n/a with SLP. 8. Estimated rehab length of stay to reach the above functional goals is: 8-11 days 9. Anticipated D/C setting: Home 10. Anticipated post D/C treatments: HH therapy and Outpatient therapy 11. Overall Rehab/Functional Prognosis: excellent  RECOMMENDATIONS: This patient's condition is appropriate for continued rehabilitative care in the following setting: CIR Patient has agreed to participate in recommended program. Yes Note that insurance prior authorization may be required for reimbursement for recommended care.  Comment: Rehab Admissions Coordinator to follow up.  Thanks,  Meredith Staggers, MD, Mellody Drown    Cathlyn Parsons., PA-C 04/29/2017

## 2017-04-29 NOTE — Clinical Social Work Note (Signed)
CSW following patient progression in regards to SNF backup plan to CIR. Per RN, patient has been here since July. No insurance or SS# and patient speaks [place] only. Due to these barriers, patient would likely need to be placed on the difficult to place list. CSW will continue to follow in case CIR is unable to admit.  Charlynn CourtSarah Manveer Gomes, CSW [phone]

## 2017-04-29 NOTE — Progress Notes (Signed)
Progress Note  Patient Name: [name] Date of Encounter: 04/29/2017  Primary Cardiologist: Jens Som  Subjective   Improving, denies dyspnea or angina while sitting up in chair, but was unable to sleep in bed due to orthopnea.  Making some progress with physical therapy although still has some left lower extremity weakness. Mediocre urine output, but with negative fluid balance 600 mL yesterday. Weight has supposedly decreased 6 pounds since yesterday, suspect yesterday's weight was an error. Remains well above weight 180 pounds at his office visit on August 30, when he was already felt to be hypervolemic. Blood pressure has tolerated the addition of losartan, low-dose spironolactone and intravenous furosemide.  Inpatient Medications    Scheduled Meds: . aspirin EC  81 mg Oral Daily  . atorvastatin  80 mg Oral q1800  . docusate sodium  100 mg Oral Daily  . furosemide  40 mg Intravenous Q12H  . insulin aspart  0-9 Units Subcutaneous Q4H  . losartan  50 mg Oral Daily  . mouth rinse  15 mL Mouth Rinse BID  . pantoprazole (PROTONIX) IV  40 mg Intravenous Daily  . potassium chloride  20 mEq Oral BID  . protein supplement shake  11 oz Oral BID BM  . spironolactone  12.5 mg Oral Daily  . warfarin  5 mg Oral ONCE-1800  . warfarin   Does not apply Once  . Warfarin - Pharmacist Dosing Inpatient   Does not apply q1800   Continuous Infusions: . sodium chloride Stopped (04/28/17 0053)  . heparin 1,400 Units/hr (04/29/17 0310)  . magnesium sulfate 1 - 4 g bolus IVPB     PRN Meds: acetaminophen, guaiFENesin-dextromethorphan, magnesium sulfate 1 - 4 g bolus IVPB, morphine injection, ondansetron (ZOFRAN) IV, phenol   Vital Signs    Vitals:   04/28/17 2308 04/29/17 0308 04/29/17 0756 04/29/17 1138  BP: 97/70 113/90 105/69 116/65  Pulse: 82 80 79 80  Resp: (!) 23 (!) 28 (!) 28 (!) 26  Temp: 97.6 F (36.4 C) 97.8 F (36.6 C) 97.7 F (36.5 C) 98.2 F (36.8 C)  TempSrc: Oral Oral  Oral Oral  SpO2: 96% 98% 99% 98%  Weight:  198 lb 6.6 oz (90 kg)    Height:        Intake/Output Summary (Last 24 hours) at 04/29/17 1218 Last data filed at 04/29/17 1139  Gross per 24 hour  Intake           350.16 ml  Output             1130 ml  Net          -779.84 ml   Filed Weights   04/26/17 0500 04/27/17 0446 04/29/17 0308  Weight: 195 lb (88.5 kg) 204 lb 3.2 oz (92.6 kg) 198 lb 6.6 oz (90 kg)    Telemetry    Sinus rhythm/mild sinus tachycardia with frequent PVCs, occasional couplets/triplets, single 6 beat episode of nonsustained VT/accelerated idioventricular rhythm at about 100 bpm - Personally Reviewed  ECG    No new tracing - Personally Reviewed  Physical Exam  Mildly tachypneic when sitting in a chair, becomes substantially more short of breath when trying to recline GEN: No acute distress.   Neck: 7-8 centimeter JVD Cardiac: RRR, no murmurs, rubs, or gallops.  Respiratory: Clear to auscultation bilaterally. GI: Soft, nontender, non-distended  MS: No edema; No deformity. Neuro:  Nonfocal  Psych: Normal affect   Labs    Chemistry Recent Labs Lab 04/25/17 1226 04/25/17 2316  04/27/17 0312 04/28/17 0436 04/29/17 0132  NA 128* 131*  < > 129* 133* 134*  K 4.8 4.3  < > 3.9 3.9 3.5  CL 101 105  < > 104 107 104  CO2 18* 19*  < > 16* 18* 20*  GLUCOSE 246* 131*  < > 136* 141* 191*  BUN 17 15  < > CREATININE 1.06 0.87  < > 0.90 1.00 0.97  CALCIUM 8.2* 8.3*  < > 8.0* 8.6* 8.9  PROT  --  5.4*  --   --   --   --   ALBUMIN 3.2* 3.0*  --   --   --   --   AST  --  17  --   --   --   --   ALT  --  15*  --   --   --   --   ALKPHOS  --  50  --   --   --   --   BILITOT  --  0.4  --   --   --   --   GFRNONAA >60 >60  < > >60 >60 >60  GFRAA >60 >60  < > >60 >60 >60  ANIONGAP 9 7  < > < > = values in this interval not displayed.   Hematology Recent Labs Lab 04/27/17 0312 04/28/17 0436 04/29/17 0132  WBC 12.2* 11.1* 10.0  RBC 3.85* 3.94*  4.32  HGB 9.6* 9.8* 10.7*  HCT 26.9* 27.5* 30.0*  MCV 69.9* 69.8* 69.4*  MCH 24.9* 24.9* 24.8*  MCHC 35.7 35.6 35.7  RDW 14.3 14.4 14.3  PLT 166 181 200    Cardiac Enzymes Recent Labs Lab 04/24/17 1000 04/25/17 2316 04/26/17 0530  TROPONINI 0.05* 0.21* 0.24*    Recent Labs Lab 04/24/17 1012  TROPIPOC 0.11*     BNP Recent Labs Lab 04/24/17 1000  BNP 832.8*     DDimer  Recent Labs Lab 04/24/17 1000  DDIMER 0.92*     Radiology    Mr Brain Wo Contrast  Result Date: 04/28/2017 CLINICAL DATA:  Status post mechanical thrombectomy RIGHT M1 occlusion. Focal neurological deficit, suspect stroke. EXAM: MRI HEAD WITHOUT CONTRAST TECHNIQUE: Multiplanar, multiecho pulse sequences of the brain and surrounding structures were obtained without intravenous contrast. COMPARISON:  CT HEAD April 26, 2017 and MRI of the head April 25, 2017 FINDINGS: BRAIN: New punctate focus reduced diffusion LEFT frontal cortex (axial diffusion weighted imaging 44/100) Reduced diffusion at RIGHT insula and posterior lenticulostriate nucleus unchanged distribution for prior imaging. Punctate focus reduced diffusion LEFT frontal and LEFT occipital lobes are unchanged. No further propagation. Normalizing ADC values. No new infarcts. No susceptibility artifact to suggest hemorrhage. A few scattered supratentorial chronic micro hemorrhages associated with chronic hypertension. Old RIGHT thalamus lacunar infarct with mineralization. Patchy to confluent supratentorial white matter FLAIR T2 hyperintensities. Old small cerebellar infarcts. Ventricles and sulci are overall normal for patient's age. No midline shift, mass effect or masses. No abnormal extra-axial fluid collections. VASCULAR: Normal major intracranial vascular flow voids present at skull base. SKULL AND UPPER CERVICAL SPINE: No abnormal sellar expansion. No suspicious calvarial bone marrow signal. Craniocervical junction maintained. SINUSES/ORBITS:  LEFT maxillary mucosal retention cyst. Trace paranasal sinus mucosal thickening. Trace LEFT mastoid effusion. The included ocular globes and orbital contents are non-suspicious. OTHER: None. IMPRESSION: 1. New, acute punctate LEFT frontal lobe infarct. 2. Evolving acute to subacute RIGHT MCA territory and punctate LEFT frontal and  occipital infarcts without further propagation, new infarcts or hemorrhagic conversion. 3. 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 Old RIGHT thalamus and small cerebellar infarcts. Electronically Signed   By: Awilda Metroourtnay  Bloomer M.D.   On: 04/28/2017 23:09    Cardiac Studies   04/18/2017 ECHO Study Conclusions - Left ventricle: The cavity size was moderately dilated. Wall thickness was normal. Systolic function was severely reduced. The estimated ejection fraction was in the range of 20% to 25%. - Aortic valve: There was mild regurgitation. - Mitral valve: There was mild regurgitation.    Patient Profile     71 y.o. male  with severe ischemic cardiopathy (EF 20-25%), complicated by acute pulmonary edema, acute hypoxic respiratory failure requiring intubation, hyponatremia, subsequently with acute ischemic R MCA stroke and acute right SFA-popliteal occlusion, requiring thrombectomy and thrombolytics.  Now 5 days following suspected embolic event, extubated, improved blood pressure.   Assessment & Plan    1. Acute on chronic systolic and diastolic heart failure: He has severe ischemic cardiomyopathy. He has orthopnea. He weighed 180 pounds when he saw Dr. Jens Somrenshaw in the office on August 30 and was presumed to be hypervolemic at that time. Now 198 lb. Continue intravenous furosemide (will increase the dose today), ARB, low-dose Aldactone. Potassium check daily. Continue to hold carvedilol until he is closer to euvolemic.  Reevaluate left ventricular ejection fraction after optimization of heart failure meds, in about 3 months. May need implantable  defibrillator. 2. CAD s/p CABG: He has not had angina pectoris. Nuclear stress test performed just a few days ago showed an extensive inferior and inferolateral scar and EF under 30%. Doubt acute coronary event. 3. Acute embolic right MCA stroke: Thankfully seems to have only mild residual hemiparesis, expected to improve over the next several days-weeks. On heparin, warfarin initiated. Physical therapy recommends consults to inpatient rehabilitation. 4. R popliteal acute embolic occlusion: Appears to have good pulses and perfusion of the right lower extremity. 5. Hyponatremia: Improving, almost normal. Continue diuretics 6. HLP: Target LDL <70, was 93 recently. Atorvastatin 80 mg daily was just started. Recheck lipids in 3 months 7. DM: Poorly controlled, most recent hemoglobin A1c 9.4%. Recently started on Lantus and given a glucometer.  For questions or updates, please contact [facility] Please consult [email or web address] for contact info under Cardiology/STEMI.      Signed, Thurmon FairMihai Alvon Nygaard, MD  04/29/2017, 12:18 PM

## 2017-04-29 NOTE — Progress Notes (Signed)
Referring Physician(s): Dr Lavera Guise  Supervising Physician: Corrie Mckusick  Patient Status:  [facility] - In-pt  Chief Complaint:  CVA 04/25/17 Status post ultrasound guided access right common femoral artery with cerebral angiogram and mechanical thrombectomy for treatment of acute ELVO of right M1, with restoration of TICI 3 flow after 1 pass.   Subjective:  Doing well Up in chair; speaking to me via computer interpreter. Denies pain; N/V Speech is clear Moving all 4s  Allergies: Patient has no known allergies.  Medications: Prior to Admission medications   Medication Sig Start Date End Date Taking? Authorizing Provider  acetaminophen (TYLENOL) 650 MG CR tablet Take 650 mg by mouth at bedtime. FOR LEG PAIN   Yes [provider]  aspirin EC 325 MG tablet Take 1 tablet (325 mg total) by mouth daily. 04/23/17  Yes Ena Dawley, Tiffany S, PA-C  atorvastatin (LIPITOR) 80 MG tablet Take 1 tablet (80 mg total) by mouth daily at 6 PM. 04/23/17  Yes Ena Dawley, Tiffany S, PA-C  carvedilol (COREG) 3.125 MG tablet Take 1 tablet (3.125 mg total) by mouth 2 (two) times daily with a meal. 04/23/17  Yes Ena Dawley, Tiffany S, PA-C  feeding supplement, GLUCERNA SHAKE, (GLUCERNA SHAKE) LIQD Take 237 mLs by mouth 2 (two) times daily between meals.   Yes [provider]  furosemide (LASIX) 20 MG tablet Take 1 tablet (20 mg total) by mouth daily. 04/23/17  Yes Ena Dawley, Tiffany S, PA-C  insulin glargine (LANTUS) 100 UNIT/ML injection Inject 0.05 mLs (5 Units total) into the skin at bedtime. 04/22/17 05/02/17 Yes Noemi Chapel, MD  losartan (COZAAR) 25 MG tablet Take 1 tablet (25 mg total) by mouth daily. 04/23/17  Yes Ena Dawley, Tiffany S, PA-C  meloxicam (MOBIC) 7.5 MG tablet Take 2 tablets (15 mg total) by mouth daily. Patient taking differently: Take 7.5 mg by mouth 2 (two) times daily.  04/22/17  Yes Noemi Chapel, MD  metFORMIN (GLUCOPHAGE) 1000 MG tablet Take 1 tablet (1,000 mg total) by mouth daily with breakfast.  04/23/17  Yes Ena Dawley, Tiffany S, PA-C  Blood Glucose Monitoring Suppl (TRUE METRIX METER) DEVI 1 kit by Does not apply route 4 (four) times daily. 04/23/17   Brayton Caves, PA-C  glucose blood (TRUE METRIX BLOOD GLUCOSE TEST) test strip Use as instructed 04/23/17   Brayton Caves, PA-C  TRUEPLUS LANCETS 28G MISC 28 g by Does not apply route 4 (four) times daily. 04/23/17   Brayton Caves, PA-C     Vital Signs: BP 105/69 (BP Location: Right Arm)   Pulse 79   Temp 97.7 F (36.5 C) (Oral)   Resp (!) 28   Ht 6' (1.829 m)   Wt 198 lb 6.6 oz (90 kg)   SpO2 99%   BMI 26.91 kg/m   Physical Exam  Constitutional: He is oriented to person, place, and time. He appears well-nourished.  Abdominal: Soft.  Musculoskeletal: Normal range of motion.  Right groin site is clean and dry NT no bleeding 1+ pulse  Neurological: He is alert and oriented to person, place, and time.  Skin: Skin is warm and dry.  Psychiatric: He has a normal mood and affect. His behavior is normal.  Nursing note and vitals reviewed.   Imaging: Ct Head Wo Contrast  Result Date: 04/26/2017 CLINICAL DATA:  One day follow-up status post tPA. Status post mechanical thrombectomy RIGHT M1 occlusion. EXAM: CT HEAD WITHOUT CONTRAST TECHNIQUE: Contiguous axial images were obtained from the base of the skull through  the vertex without intravenous contrast. COMPARISON:  MRI of the head April 25, 2017 and CT HEAD April 25, 2017 FINDINGS: BRAIN: No intraparenchymal hemorrhage, mass effect nor midline shift. The ventricles and sulci are normal for age. Patchy supratentorial white matter hypodensities. Old RIGHT thalamus lacunar infarct. No acute large vascular territory infarcts. No abnormal extra-axial fluid collections. Basal cisterns are patent. VASCULAR: Severe calcific atherosclerosis of the carotid siphons. SKULL: No skull fracture. No significant scalp soft tissue swelling. SINUSES/ORBITS: Mild paranasal sinus mucosal thickening. LEFT  maxillary mucosal retention cyst.The included ocular globes and orbital contents are non-suspicious. OTHER: Life-support lines in place. IMPRESSION: 1. No acute intracranial process. Known acute RIGHT MCA territory infarcts not apparent by CT. 2. 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 Moderate chronic small vessel ischemic disease. Electronically Signed   By: Elon Alas M.D.   On: 04/26/2017 01:56   Mr Brain Wo Contrast  Result Date: 04/28/2017 CLINICAL DATA:  Status post mechanical thrombectomy RIGHT M1 occlusion. Focal neurological deficit, suspect stroke. EXAM: MRI HEAD WITHOUT CONTRAST TECHNIQUE: Multiplanar, multiecho pulse sequences of the brain and surrounding structures were obtained without intravenous contrast. COMPARISON:  CT HEAD April 26, 2017 and MRI of the head April 25, 2017 FINDINGS: BRAIN: New punctate focus reduced diffusion LEFT frontal cortex (axial diffusion weighted imaging 44/100) Reduced diffusion at RIGHT insula and posterior lenticulostriate nucleus unchanged distribution for prior imaging. Punctate focus reduced diffusion LEFT frontal and LEFT occipital lobes are unchanged. No further propagation. Normalizing ADC values. No new infarcts. No susceptibility artifact to suggest hemorrhage. A few scattered supratentorial chronic micro hemorrhages associated with chronic hypertension. Old RIGHT thalamus lacunar infarct with mineralization. Patchy to confluent supratentorial white matter FLAIR T2 hyperintensities. Old small cerebellar infarcts. Ventricles and sulci are overall normal for patient's age. No midline shift, mass effect or masses. No abnormal extra-axial fluid collections. VASCULAR: Normal major intracranial vascular flow voids present at skull base. SKULL AND UPPER CERVICAL SPINE: No abnormal sellar expansion. No suspicious calvarial bone marrow signal. Craniocervical junction maintained. SINUSES/ORBITS: LEFT maxillary mucosal retention cyst. Trace paranasal sinus mucosal  thickening. Trace LEFT mastoid effusion. The included ocular globes and orbital contents are non-suspicious. OTHER: None. IMPRESSION: 1. New, acute punctate LEFT frontal lobe infarct. 2. Evolving acute to subacute RIGHT MCA territory and punctate LEFT frontal and occipital infarcts without further propagation, new infarcts or hemorrhagic conversion. 3. 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 Old RIGHT thalamus and small cerebellar infarcts. Electronically Signed   By: Elon Alas M.D.   On: 04/28/2017 23:09   Mr Jeri Cos IW Contrast  Result Date: 04/25/2017 CLINICAL DATA:  Follow-up examination for stroke, status post transarterial intervention for right M1 EVLO. EXAM: MRI HEAD WITHOUT AND WITH CONTRAST TECHNIQUE: Multiplanar, multiecho pulse sequences of the brain and surrounding structures were obtained without and with intravenous contrast. CONTRAST:  79m MULTIHANCE GADOBENATE DIMEGLUMINE 529 MG/ML IV SOLN COMPARISON:  Prior CT from earlier the same day as well as previous studies. FINDINGS: Brain: Generalized age-related cerebral atrophy. Patchy and confluent T2/FLAIR hyperintensity within the periventricular and deep white matter both cerebral hemispheres most consistent with chronic microvascular disease, moderate nature. Remote lacunar infarct with associated chronic hemorrhagic blood products present within the right thalamus. Few additional tiny remote bilateral cerebellar infarcts noted. Mild diffusion abnormality present within the posterior right lentiform nucleus extending superiorly into the right caudate (series 4, image 18). Additional faint diffusion abnormality within the right insula, most notable inferiorly (series 3, image 27) findings consistent with acute ischemia. No  associated mass effect or hemorrhage. No other areas of acute right MCA territory infarction. Additional punctate 5 mm subcortical nonhemorrhagic infarct noted within the left occipital lobe (series 3,  image 26). Gray-white matter differentiation otherwise maintained. No other evidence for acute or subacute ischemia. No evidence for acute intracranial hemorrhage. Few scattered punctate chronic micro hemorrhages noted within the brain and posterior fossa, likely hypertensive in nature. No mass lesion, midline shift or mass effect. No hydrocephalus. No extra-axial fluid collection. Major dural sinuses are grossly patent. No abnormal enhancement. Pituitary gland suprasellar region normal. Vascular: Major intracranial vascular flow voids are maintained. Skull and upper cervical spine: Craniocervical junction within normal limits. Visualized upper cervical spine unremarkable. Bone marrow signal intensity within normal limits. Probable benign hemangioma noted within the C2 vertebral body. No scalp soft tissue abnormality. Sinuses/Orbits: Globes and orbital soft tissues within normal limits. Retention cysts noted within the left maxillary sinus. Paranasal sinuses otherwise largely clear. No mastoid effusion. Inner ear structures normal. Other: Subcentimeter T2 hyperintensity noted within the left parotid gland, of doubtful significance. IMPRESSION: 1. Faint ischemic changes involving the right insula out and basal ganglia as above. No associated hemorrhage or mass effect. 2. Additional punctate 5 mm subcortical left occipital lobe infarct. 3. Remote right thalamic lacunar infarct with additional tiny remote bilateral cerebellar infarcts. 4. Atrophy with moderate chronic microvascular ischemic disease. Electronically Signed   By: Jeannine Boga M.D.   On: 04/25/2017 14:24   Dg Chest Port 1 View  Result Date: 04/27/2017 CLINICAL DATA:  Respiratory failure. EXAM: PORTABLE CHEST 1 VIEW COMPARISON:  Radiograph of April 26, 2017. FINDINGS: Stable cardiomegaly. Stable position of endotracheal and nasogastric tubes. No pneumothorax is noted. Increased bibasilar opacities are noted concerning for worsening edema or  atelectasis with associated pleural effusions. Bony thorax is unremarkable. IMPRESSION: Stable support apparatus. Increased bibasilar edema or atelectasis is noted with associated pleural effusions. Electronically Signed   By: Marijo Conception, M.D.   On: 04/27/2017 07:54   Dg Chest Port 1 View  Result Date: 04/26/2017 CLINICAL DATA:  Respiratory failure EXAM: PORTABLE CHEST 1 VIEW COMPARISON:  04/25/2017 FINDINGS: Endotracheal tube in good position.  NG tube in the stomach. Interval improvement in vascular congestion and mild edema. Progression of left lower lobe consolidation and effusion. Small right effusion has developed. IMPRESSION: Improvement in mild pulmonary edema. Progression of left lower lobe airspace disease and left effusion. Electronically Signed   By: Franchot Gallo M.D.   On: 04/26/2017 07:18    Labs:  CBC:  Recent Labs  04/26/17 0530 04/27/17 0312 04/28/17 0436 04/29/17 0132  WBC 8.5 12.2* 11.1* 10.0  HGB 10.4* 9.6* 9.8* 10.7*  HCT 28.7* 26.9* 27.5* 30.0*  PLT 194 166 181 200    COAGS:  Recent Labs  04/25/17 0100 04/29/17 0132  INR 1.14 1.26  APTT 34  --     BMP:  Recent Labs  04/26/17 0530 04/27/17 0312 04/28/17 0436 04/29/17 0132  NA 129* 129* 133* 134*  K 4.2 3.9 3.9 3.5  CL 103 104 107 104  CO2 18* 16* 18* 20*  GLUCOSE 131* 136* 141* 191*  BUN _0 CALCIUM 8.3* 8.0* 8.6* 8.9  CREATININE 0.87 0.90 1.00 0.97  GFRNONAA >60 >60 >60 >60  GFRAA >60 >60 >60 >60    LIVER FUNCTION TESTS:  Recent Labs  04/17/17 1050 04/25/17 1226 04/25/17 2316  BILITOT 0.7  --  0.4  AST 31  --  17  ALT 34  --  15*  ALKPHOS 71  --  50  PROT 6.5  --  5.4*  ALBUMIN 3.5 3.2* 3.0*    Assessment and Plan:  CVA with revascularization 04/25/2017 Doing well Plan per Neuro/Stroke  Electronically Signed: Nissi Doffing A, PA-C 04/29/2017, 10:04 AM   I spent a total of 15 Minutes at the the patient's bedside AND on the patient's hospital floor or unit,  greater than 50% of which was counseling/coordinating care for CVA/revascularization

## 2017-04-29 NOTE — Progress Notes (Signed)
ANTICOAGULATION CONSULT NOTE - Follow up Consult  Pharmacy Consult for Heparin and Warfarin Indication: stroke and SFA thrombus  No Known Allergies  Patient Measurements: Height: 6' (182.9 cm) Weight: 198 lb 6.6 oz (90 kg) IBW/kg (Calculated) : 77.6 Heparin Dosing Weight: 87.7kg  Vital Signs: Temp: 97.7 F (36.5 C) (09/11 0756) Temp Source: Oral (09/11 0756) BP: 105/69 (09/11 0756) Pulse Rate: 79 (09/11 0756)  Labs:  Recent Labs  04/27/17 0312  04/28/17 0436 04/28/17 1754 04/29/17 0132  HGB 9.6*  --  9.8*  --  10.7*  HCT 26.9*  --  27.5*  --  30.0*  PLT 166  --  181  --  200  LABPROT  --   --   --   --  15.7*  INR  --   --   --   --  1.26  HEPARINUNFRC 0.31  < > 0.20* 0.28* 0.36  CREATININE 0.90  --  1.00  --  0.97  < > = values in this interval not displayed.  Estimated Creatinine Clearance: 77.8 mL/min (by C-G formula based on SCr of 0.97 mg/dL).  Medications:  Heparin @ 1400 units/hr  Assessment: 71 yom presented with an acute stroke and subsequent SFA thrombus. Received tPA 9/7 AM and now transitioned to IV heparin. Heparin level is therapeutic at 0.36. Warfarin started yesterday. INR 1.26. CBC stable.   9/10 MRI without hemorrhagic conversion  Goal of Therapy:  INR 2-3 Heparin level 0.3-0.5 units/ml Monitor platelets by anticoagulation protocol: Yes   Plan:  1) Continue heparin at 1400 units/hr 2) Warfarin 5mg  tonight 3) Daily heparin level, INR, CBC  Louie CasaJennifer Heaven Meeker, PharmD, BCPS  04/29/2017 8:32 AM

## 2017-04-29 NOTE — Progress Notes (Signed)
  Speech Language Pathology Treatment: Dysphagia  Patient Details Name: [name] MRN: [id number] DOB: 1946/02/21 Today's Date: 04/29/2017 Time: 3403-7096 SLP Time Calculation (min) (ACUTE ONLY): 17 min  Assessment / Plan / Recommendation Clinical Impression  Pt seen for safety with recommended diet and ability to upgrade texture. Per family friend translator pt states he has not had any episodes of coughing etc with food or liquid. Mastication and transit was swift with solid texture and no s/s aspiration with solid/liquid. Upgraded texture to regular, continue thin. No further ST needed for swallow. Briefly discussed pt's speech-cognitive ability however full assessment to be completed.    HPI HPI: 71 yo male developed shortness of breath, facial droop, Lt side weakness. 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 Given tPA, intubated for airway protection, and had thrombectomy of Rt M1. Developed Rt leg ischemia after procedure likely from embolic event. PMHx of CAD, ischemic CM, HTN, DM, HLD, CVA. Intubated 9/7-9/9.      SLP Plan  All goals met;Discharge SLP treatment due to (comment)       Recommendations  Diet recommendations: Regular;Thin liquid Liquids provided via: Cup;Straw Medication Administration: Whole meds with puree Supervision: Patient able to self feed Compensations: Slow rate;Small sips/bites Postural Changes and/or Swallow Maneuvers: Seated upright 90 degrees                Oral Care Recommendations: Oral care BID SLP Visit Diagnosis: Dysphagia, unspecified (R13.10) Plan: All goals met;Discharge SLP treatment due to (comment)       GO                Houston Siren 04/29/2017, 3:52 PM   Orbie Pyo Colvin Caroli.Ed [employer] [phone]

## 2017-04-29 NOTE — Discharge Instructions (Signed)

## 2017-04-29 NOTE — Progress Notes (Signed)
Physical Therapy Treatment Patient Details Name: [name] MRN: [id number] DOB: 08-01-1946 Today's Date: 04/29/2017    History of Present Illness 71 y.o.malewith a history of MI and CABG 2007, DM type 2, HTN, stroke and HLD who presented for SOB possible CHF exacerbation. During hosptial course developed facial droop, Lt side weakness. Code stroke called, imaging CTA + Right MCA M1 embolic occlusion. Patient taken to IR and received TPA.    PT Comments    Patient is making progress toward mobility goals and tolerated increased gait with mod A due to L LE weakness. Daughter translated throughout session.  Pt with RR into 40s again this session however for brief period initially upon first stand. All other vitals WNL. Current plan remains appropriate.   Follow Up Recommendations  CIR;Supervision/Assistance - 24 hour     Equipment Recommendations  Other (comment) (TBA)    Recommendations for Other Services Rehab consult     Precautions / Restrictions Precautions Precautions: Fall Precaution Comments: left post op shoe from fractured toe  Restrictions Weight Bearing Restrictions: No    Mobility  Bed Mobility               General bed mobility comments: pt OOB in chair upon arrival  Transfers Overall transfer level: Needs assistance Equipment used: Rolling walker (2 wheeled) Transfers: Sit to/from Stand Sit to Stand: Min assist         General transfer comment: assist to power up and gain balance upon standing   Ambulation/Gait Ambulation/Gait assistance: Mod assist;+2 safety/equipment (+2 for chair follow) Ambulation Distance (Feet):  (39ft then 47ft) Assistive device: Rolling walker (2 wheeled) Gait Pattern/deviations: Decreased step length - left;Step-to pattern;Step-through pattern;Decreased dorsiflexion - left;Trunk flexed Gait velocity: decreased   General Gait Details: multimodal cues for posture, weight shifting, and tactile cues for L LE advancement  and vc for heel strike; pt with difficulty advancing L LE especially when becoming fatigued and required seated rest break   Stairs            Wheelchair Mobility    Modified Rankin (Stroke Patients Only) Modified Rankin (Stroke Patients Only) Pre-Morbid Rankin Score: No symptoms Modified Rankin: Moderately severe disability     Balance Overall balance assessment: Needs assistance Sitting-balance support: Feet supported Sitting balance-Leahy Scale: Fair     Standing balance support: During functional activity;Bilateral upper extremity supported Standing balance-Leahy Scale: Poor                              Cognition Arousal/Alertness: Awake/alert Behavior During Therapy: WFL for tasks assessed/performed Overall Cognitive Status: Difficult to assess                     Current Attention Level: Selective   Following Commands: Follows one step commands consistently     Problem Solving: Requires verbal cues General Comments: daughter translated; pt a little impulsive with ambulating however pt does appear to understand his deficits      Exercises      General Comments General comments (skin integrity, edema, etc.): RR into 40s with mobility; all other vitals WNL      Pertinent Vitals/Pain Pain Assessment: No/denies pain    Home Living                      Prior Function            PT Goals (current goals can now be found  in the care plan section) Acute Rehab PT Goals Patient Stated Goal: walk PT Goal Formulation: With family Time For Goal Achievement: 05/10/17 Potential to Achieve Goals: Good Progress towards PT goals: Progressing toward goals    Frequency    Min 4X/week      PT Plan Current plan remains appropriate    Co-evaluation              AM-PAC PT "6 Clicks" Daily Activity  Outcome Measure  Difficulty turning over in bed (including adjusting bedclothes, sheets and blankets)?: A Lot Difficulty  moving from lying on back to sitting on the side of the bed? : Unable Difficulty sitting down on and standing up from a chair with arms (e.g., wheelchair, bedside commode, etc,.)?: Unable Help needed moving to and from a bed to chair (including a wheelchair)?: A Little Help needed walking in hospital room?: A Lot Help needed climbing 3-5 steps with a railing? : A Lot 6 Click Score: 11    End of Session Equipment Utilized During Treatment: Gait belt Activity Tolerance: Patient tolerated treatment well Patient left: in chair;with call bell/phone within reach;with family/visitor present Nurse Communication: Mobility status PT Visit Diagnosis: Difficulty in walking, not elsewhere classified (R26.2);Other symptoms and signs involving the nervous system (R29.898)     Time: 0925-1000 PT Time Calculation (min) (ACUTE ONLY): 35 min  Charges:  $Gait Training: 23-37 mins                    G Codes:       [name], PTA Pager: [phone]     [name] 04/29/2017, 11:33 AM

## 2017-04-29 NOTE — Telephone Encounter (Signed)
Current admit 

## 2017-04-29 NOTE — Progress Notes (Signed)
STROKE TEAM PROGRESS NOTE   SUBJECTIVE (INTERVAL HISTORY) His daughter and wife are at the bedside. Patient is doing well from stroke standpoint. Walking with PT in the hallway. No complains and no acute event overnight. Right leg JP drain off. Suture dry clean. On heparin IV, coumadin stated. Still has mild SOB.        OBJECTIVE Temp:  [97.6 F (36.4 C)-98.2 F (36.8 C)] 98 F (36.7 C) (09/11 2238) Pulse Rate:  [79-92] 92 (09/11 2238) Cardiac Rhythm: Normal sinus rhythm (09/11 1946) Resp:  [25-31] 31 (09/11 2238) BP: (105-120)/(60-90) 120/60 (09/11 2238) SpO2:  [91 %-100 %] 91 % (09/11 2238) Weight:  [198 lb 6.6 oz (90 kg)] 198 lb 6.6 oz (90 kg) (09/11 0308)  CBC:   Recent Labs Lab 04/28/17 0436 04/29/17 0132  WBC 11.1* 10.0  HGB 9.8* 10.7*  HCT 27.5* 30.0*  MCV 69.8* 69.4*  PLT 181 200    Basic Metabolic Panel:   Recent Labs Lab 04/25/17 0959 04/25/17 1226  04/28/17 0436 04/29/17 0132  NA 128* 128*  < > 133* 134*  K 4.8 4.8  < > 3.9 3.5  CL 101 101  < > 107 104  CO2 19* 18*  < > 18* 20*  GLUCOSE 246* 246*  < > 141* 191*  BUN 18 17  < > 19 20  CREATININE 1.05 1.06  < > 1.00 0.97  CALCIUM 8.1* 8.2*  < > 8.6* 8.9  MG 1.8  --   --   --   --   PHOS  --  4.4  --   --   --   < > = values in this interval not displayed.  Lipid Panel:     Component Value Date/Time   CHOL 92 04/25/2017 2317   TRIG 82 04/25/2017 2317   HDL 35 (L) 04/25/2017 2317   CHOLHDL 2.6 04/25/2017 2317   VLDL 16 04/25/2017 2317   LDLCALC 41 04/25/2017 2317   HgbA1c:  Lab Results  Component Value Date   HGBA1C 8.8 (H) 04/25/2017   Urine Drug Screen: No results found for: LABOPIA, COCAINSCRNUR, LABBENZ, AMPHETMU, THCU, LABBARB  Alcohol Level No results found for: [facility]  IMAGING I have personally reviewed the radiological images below and agree with the radiology interpretations.  Ct Head Code Stroke Wo Contrast 04/25/2017 FINDINGS Brain: Small lucency in the right insula with loss of  gray-white differentiation (series 3, image 13). Chronic lacunar infarction within the right superior thalamus. Moderate chronic microvascular ischemic changes of white matter and parenchymal volume loss of the brain. No acute intracranial hemorrhage identified. No hydrocephalus or effacement of basilar cisterns. Vascular: Right M1 hyperdensity. Extensive calcific atherosclerosis of carotid siphons. Skull: Normal. Negative for fracture or focal lesion. Sinuses: Left maxillary sinus mucous retention cyst. Otherwise negative. Orbits: No acute finding. Review of the MIP images confirms the above findings  CT head 04/25/2017 IMPRESSION:  1. Small focus of hypoattenuation within right insula compatible with acute infarct, ASPECTS = 9. 2. Moderate chronic microvascular ischemic changes and moderate parenchymal volume loss of the brain. 3. Small chronic lacunar infarct in right superior thalamus.   Dg Chest 2 View 04/24/2017 IMPRESSION: Cardiac enlargement without heart failure Mild right lower lobe atelectasis.  Dg Chest Port 1 View 04/25/2017 IMPRESSION: Appliances appear in satisfactory location. Cardiac enlargement with mild developing pulmonary vascular congestion and possible early perihilar edema.   Ct Head Wo Contrast 04/25/2017 IMPRESSION: Stable small lucency within the right insula compatible with region of  acute infarction. No new area of infarction identified. No acute hemorrhage.   CTA head 04/25/2017 1. Right mid M1 occlusion. Poor downstream right MCA collateralization. 2. Poor enhancement of distal left cavernous and ophthalmic segments of ICA may be due to slow flow secondary to common carotid occlusion or underlying high-grade stenosis. 3. Intracranial atherosclerosis with multiple areas of mild stenosis in the anterior and posterior circulation. 4. No additional large vessel occlusion, aneurysm, or evidence for vascular malformation.    CTA neck 04/25/2017  1. Left common carotid artery  occlusion. Reconstitution of left internal carotid artery from external carotid artery collaterals.  Poor enhancement of left internal carotid artery indicating slow flow.  2. No significant stenosis of the right carotid system in the neck. 3. Right V1 occlusion with reconstitution of right vertebral artery at the V2 segment. 4. Severe left vertebral artery origin stenosis secondary to calcified plaque. 5. 4.2 cm aortic aneurysm. Recommend annual imaging followup by CTA or MRA. T 6. Interstitial pulmonary edema. Enlarged mediastinal lymph nodes is likely related to pulmonary edema.  Ir Percutaneous Art Thrombectomy/infusion Intracranial Inc Diag Angio 04/25/2017 IMPRESSION: Status post ultrasound guided access right common femoral artery with cerebral angiogram and mechanical thrombectomy for treatment of acute ELVO of right M1, with restoration of TICI 3 flow after 1 pass. Completion angiogram at the right common femoral artery puncture demonstrates proximal occlusion of the superficial femoral artery, and there are absent pedal pulses on Doppler examination at the right foot. Signed, Yvone Neu. Loreta Ave DO [facility] Radiology PLAN: Patient will have a noncontrast head CT performed. Patient will have CT angiogram runoff of the abdomen and lower extremities given the absent pedal pulses. Patient will be admitted to ICU maintained on the ventilator. Sheath has been withdrawn from the right common femoral artery, with failure of suture mediated device. Case has been discussed with vascular surgery. Compression dressing should remain for 6 hours.  Ct Angio Ao+bifem W & Or Wo Contrast 04/25/2017 IMPRESSION: Occlusion of the right SFA just after the origin, with patent stump. There is minimal collateral filling of the distal SFA and popliteal artery via patent profunda femoris. There is also absent contrast flow within the right-sided tibial vasculature to the  ankle. On the left, contrast flow is maintained through the popliteal artery, however, there is decrease contrast within the anterior tibial artery and posterior tibial artery. There appears to be at least partial filling of the peroneal artery to the ankle. This may be related to additional emboli versus baseline vascular disease. Moderate atherosclerotic changes of the bilateral iliac, femoral-popliteal, and tibial vasculature, with calcifications through the length of the right SFA, right popliteal artery, and bilateral tibial arteries. Irregularity of the intimal surface of the right common femoral artery, which is patent with mild narrowing, at the site of recent femoral arterial sheath access. No evidence of hematoma with compression dressing in place.   MRI 04/25/2017 IMPRESSION: 1. Faint ischemic changes involving the right insula out and basal ganglia as above. No associated hemorrhage or mass effect.  2. Additional punctate 5 mm subcortical left occipital lobe infarct. 3. Remote right thalamic lacunar infarct with additional tiny remote bilateral cerebellar infarcts. 4. Atrophy with moderate chronic microvascular ischemic disease.  TTE 04/18/2017 Study Conclusions - Left ventricle: The cavity size was moderately dilated. Wall  thickness was normal. Systolic function was severely reduced. The estimated ejection fraction was in the range of 20% to 25%. - Aortic valve: There was mild regurgitation. -  Mitral valve: There was mild regurgitation.    PHYSICAL EXAM Elderly african male intubated and sedated. Afebrile. Head is nontraumatic. Neck is supple without bruit.   Cardiac exam no murmur or gallop. Lungs are clear to auscultation. Right lower extremity minimal JP drain. Neurological Exam :  Awake, alert, follows all simple commands. Eyes attends to both sides, PERRL, fundi not visualized. face symmetric without obvious weakness. Motor system exam shows LUE drift, but 4/5 proximal and  diatal. RUE 5/5. BLE 4/5 proximal and 5/5 distal. DTR 1+, no Babinski. Sensation symmetric bilaterally, coordination not cooperative due to language barrier and gait not tested.  ASSESSMENT/PLAN Mr. Gardiner Rhymessa Coll is a 71 y.o. male with history of . French-speaking male from [name] with a history of CAD with MI and CABG in [place] in 2007, CHF with EF of 25%, DM2 (recent A1C 9.4%), HLD, HTN, PE, and stroke who was admitted for CHF and was called as a Code Stroke at 1215 on 04/25/2017 after he developed L hemiparesis and a R-sided facial droop.  Patient was administered IV t-PA at 0130 on 04/25/2017.   Stroke: right MCA insular cortex, BG and CR patchy infarcts, as well as left MCA/PCA punctate infarct, s/p right M1 mechanical thrombectomy with TICI3 reperfusion, consistent with cardioembolic strokes, likely due to cardiomyopathy  Resultant  Left UE drift  CT head: Small lucency in the right insula with loss of gray-white differentiation (series 3, image 13). Chronic lacunar infarction within the right superior thalamus  MRI head: Faint ischemic changes in R insula and basal ganglia.  New punctate 5 mm subcortical L occipital lobe infarct.  Remote R thalamic lacunar infarct with additional tiny remote bilateral cerebellar infarcts.  CTA head/neck: Right mid M1 occlusion. Left CCA occlusion with decreased left ICA flow. Bilateral ICA siphon high-grade stenosis. Right V1 occlusion with reconstitution of right VA at the V2 segment.  High-grade L VA stenosis with calcified plaque.  4.2 cm aortic aneurysm.   2D Echo: EF 20-25%. No cardiac embolus  DSA - right MCA TICI 3 reperfusion  LDL 93   HgbA1c 9.4  IV heparin for VTE prophylaxis Diet heart healthy/carb modified Room service appropriate? Yes; Fluid consistency: Thin  aspirin 325 mg daily prior to admission, now on IV heparin bridge to coumadin. INR goal 2-3. Continue ASA 81 for CAD s/p CABG.   Patient counseled to be compliant with his  antithrombotic medications  Ongoing aggressive stroke risk factor management  Therapy recommendations:  pending  Disposition:  Pending  Right femoral artery occlusion s/p thrombectomy  likely in-situ thrombosis rather than thromboembolism  S/p thrombectomy and endarterectomy and patch angioplasty of the below-the-knee popliteal artery.    RLE drainage d/c'ed  Surgery site dry clean  Cardiomyopathy / CHF exacerbation  History of CAD/MI s/p CABG in 2007  EF 20-25%  Admitted for CHF exacerbation  acute ST segment elevation during vascular procedure  Elevated trop 0.05-0.21-0.24  On heparin IV now, coumadin started. INR goal 2-3  Repeat TTE in 3 months to evaluate EF.  Urinary retention  Nephrology on board  Status post Foley catheter  Foley catheter d/c'ed after approval from urology  Left common carotid occlusion, likely chronic  CTA neck showed Left CCA occlusion.   Reconstitution of left ICA from external carotid artery collaterals.    slow flow of ICA   Avoid hypotension  Continue ASA  Intracranial stenosis  Bilateral ICA siphon high-grade stenosis  Left VA origin high-grade stenosis  Right VA occlusion with reconstitution  Avoid hypotension  Continue ASA  Hypotension  Improved after extubation  Off pressor  Long-term BP goal normotensive  Hyperlipidemia  Home meds: atorvastatin 80 mg PO daily, resumed in hospital  LDL 93, goal < 70  Continue statin at discharge  Diabetes  HgbA1c 9.4, goal < 7.0  Uncontrolled  Hyperglycemia  DM Coordinator consulted  SSI  CBG monitoring  Other Stroke Risk Factors  Advanced age  Hx stroke/TIA  CAD/MI s/p CABG  History of PE  Other Active Problems  Hyponatremia  Elevated troponin  Acute blood loss anemia - stable  Hospital day # 5  Neurology will sign off. Please call with questions. Pt will follow up with Dr. Roda Shutters at [facility] in about 6 weeks. Thanks for the consult.  Marvel Plan, MD PhD Stroke Neurology 04/29/2017 11:22 PM    To contact Stroke Continuity provider, please refer to [email or web address]. After hours, contact General Neurology

## 2017-04-29 NOTE — Telephone Encounter (Signed)
Current admit. TOC appt was delayed until 05/08/17

## 2017-04-30 LAB — GLUCOSE, CAPILLARY
GLUCOSE-CAPILLARY: 138 mg/dL — AB (ref 65–99)
GLUCOSE-CAPILLARY: 147 mg/dL — AB (ref 65–99)
GLUCOSE-CAPILLARY: 153 mg/dL — AB (ref 65–99)
GLUCOSE-CAPILLARY: 184 mg/dL — AB (ref 65–99)
GLUCOSE-CAPILLARY: 219 mg/dL — AB (ref 65–99)
Glucose-Capillary: 180 mg/dL — ABNORMAL HIGH (ref 65–99)

## 2017-04-30 LAB — PROTIME-INR
INR: 1.26
PROTHROMBIN TIME: 15.7 s — AB (ref 11.4–15.2)

## 2017-04-30 LAB — BASIC METABOLIC PANEL
Anion gap: 7 (ref 5–15)
BUN: 17 mg/dL (ref 6–20)
CALCIUM: 9 mg/dL (ref 8.9–10.3)
CO2: 23 mmol/L (ref 22–32)
Chloride: 106 mmol/L (ref 101–111)
Creatinine, Ser: 0.89 mg/dL (ref 0.61–1.24)
GFR calc Af Amer: >60 mL/min (ref >60)
GLUCOSE: 174 mg/dL — AB (ref 65–99)
Potassium: 3.7 mmol/L (ref 3.5–5.1)
Sodium: 136 mmol/L (ref 135–145)

## 2017-04-30 LAB — HEPARIN LEVEL (UNFRACTIONATED): HEPARIN UNFRACTIONATED: 0.5 [IU]/mL (ref 0.30–0.70)

## 2017-04-30 LAB — CBC
HCT: 29.9 % — ABNORMAL LOW (ref 39.0–52.0)
Hemoglobin: 10.5 g/dL — ABNORMAL LOW (ref 13.0–17.0)
MCH: 24.6 pg — ABNORMAL LOW (ref 26.0–34.0)
MCHC: 35.1 g/dL (ref 30.0–36.0)
MCV: 70 fL — ABNORMAL LOW (ref 78.0–100.0)
PLATELETS: 219 10*3/uL (ref 150–400)
RBC: 4.27 MIL/uL (ref 4.22–5.81)
RDW: 14.4 % (ref 11.5–15.5)
WBC: 8 10*3/uL (ref 4.0–10.5)

## 2017-04-30 MED ORDER — PANTOPRAZOLE SODIUM 40 MG PO TBEC
40.0000 mg | DELAYED_RELEASE_TABLET | Freq: Every day | ORAL | Status: DC
  Administered 2017-04-30 – 2017-05-05 (×6): 40 mg via ORAL
  Filled 2017-04-30 (×7): qty 1

## 2017-04-30 MED ORDER — HEPARIN (PORCINE) IN NACL 100-0.45 UNIT/ML-% IJ SOLN
1150.0000 [IU]/h | INTRAMUSCULAR | Status: DC
  Administered 2017-04-30 (×2): 1350 [IU]/h via INTRAVENOUS
  Administered 2017-05-03 – 2017-05-04 (×2): 1250 [IU]/h via INTRAVENOUS
  Administered 2017-05-04: 1200 [IU]/h via INTRAVENOUS
  Filled 2017-04-30 (×7): qty 250

## 2017-04-30 MED ORDER — WARFARIN SODIUM 7.5 MG PO TABS
7.5000 mg | ORAL_TABLET | Freq: Once | ORAL | Status: AC
  Administered 2017-04-30: 7.5 mg via ORAL
  Filled 2017-04-30: qty 1

## 2017-04-30 NOTE — Telephone Encounter (Signed)
Still admitted

## 2017-04-30 NOTE — Progress Notes (Addendum)
Rehab admissions - I met with patient and his daughter at the bedside.  The "Tele" interpreter helped with [place] interpretation for patient until daughter arrived.  Patient has no insurance.  Daughter would like inpatient rehab and then home with daughter.  Noted patient fluid overloaded per notes yesterday.  Will await medical clearance from attending MD and then will consider inpatient rehab admission.  Call me for questions.  #[phone]  Per attending MD, patient not yet ready for CIR.  I will follow progress.  #[phone]

## 2017-04-30 NOTE — Progress Notes (Signed)
Progress Note  Patient Name: [name] Date of Encounter: 04/30/2017  Primary Cardiologist: Jens Som  Subjective   Pt states he is feeling better today. He walked in the halls with no chest pain or shortness of breath. No orthopnea last night, he had a little cough.   Inpatient Medications    Scheduled Meds: . aspirin EC  81 mg Oral Daily  . atorvastatin  80 mg Oral q1800  . docusate sodium  100 mg Oral Daily  . furosemide  80 mg Intravenous Q12H  . insulin aspart  0-9 Units Subcutaneous Q4H  . losartan  50 mg Oral Daily  . mouth rinse  15 mL Mouth Rinse BID  . pantoprazole  40 mg Oral Daily  . potassium chloride  20 mEq Oral BID  . protein supplement shake  11 oz Oral BID BM  . spironolactone  12.5 mg Oral Daily  . warfarin  7.5 mg Oral ONCE-1800  . warfarin   Does not apply Once  . Warfarin - Pharmacist Dosing Inpatient   Does not apply q1800   Continuous Infusions: . sodium chloride Stopped (04/28/17 0053)  . heparin 1,350 Units/hr (04/30/17 0904)  . magnesium sulfate 1 - 4 g bolus IVPB     PRN Meds: acetaminophen, guaiFENesin-dextromethorphan, magnesium sulfate 1 - 4 g bolus IVPB, morphine injection, ondansetron (ZOFRAN) IV, phenol   Vital Signs    Vitals:   04/29/17 2238 04/30/17 0421 04/30/17 0833 04/30/17 1201  BP: 120/60 110/74 113/75 114/71  Pulse: 92 77 80   Resp: (!) 31 (!) 22 19   Temp: 98 F (36.7 C) 98.1 F (36.7 C) 98.1 F (36.7 C) 98 F (36.7 C)  TempSrc: Oral Oral Oral Oral  SpO2: 91% 100% 92%   Weight:      Height:        Intake/Output Summary (Last 24 hours) at 04/30/17 1516 Last data filed at 04/30/17 0913  Gross per 24 hour  Intake              350 ml  Output             1300 ml  Net             -950 ml   Filed Weights   04/26/17 0500 04/27/17 0446 04/29/17 0308  Weight: 195 lb (88.5 kg) 204 lb 3.2 oz (92.6 kg) 198 lb 6.6 oz (90 kg)    Telemetry    Sinus rhythm with frequent PVC's and rates in the 70's-80's. Rare 2-3 beat  runs.- Personally Reviewed  ECG    No new tracing - Personally Reviewed  Physical Exam   GEN: No acute distress.   Neck: No JVD Cardiac: RRR, no murmurs, rubs, or gallops.  Respiratory: Clear to auscultation bilaterally. GI: Soft, nontender, non-distended  MS: No edema; No deformity. Staple intact to Right inner leg Neuro:  Nonfocal  Psych: Normal affect   Labs    Chemistry Recent Labs Lab 04/25/17 1226 04/25/17 2316  04/28/17 0436 04/29/17 0132 04/30/17 0205  NA 128* 131*  < > 133* 134* 136  K 4.8 4.3  < > 3.9 3.5 3.7  CL 101 105  < > 107 104 106  CO2 18* 19*  < > 18* 20* 23  GLUCOSE 246* 131*  < > 141* 191* 174*  BUN 17 15  < > CREATININE 1.06 0.87  < > 1.00 0.97 0.89  CALCIUM 8.2* 8.3*  < > 8.6* 8.9 9.0  PROT  --  5.4*  --   --   --   --   ALBUMIN 3.2* 3.0*  --   --   --   --   AST  --  17  --   --   --   --   ALT  --  15*  --   --   --   --   ALKPHOS  --  50  --   --   --   --   BILITOT  --  0.4  --   --   --   --   GFRNONAA >60 >60  < > >60 >60 >60  GFRAA >60 >60  < > >60 >60 >60  ANIONGAP 9 7  < > < > = values in this interval not displayed.   Hematology  Recent Labs Lab 04/28/17 0436 04/29/17 0132 04/30/17 0205  WBC 11.1* 10.0 8.0  RBC 3.94* 4.32 4.27  HGB 9.8* 10.7* 10.5*  HCT 27.5* 30.0* 29.9*  MCV 69.8* 69.4* 70.0*  MCH 24.9* 24.8* 24.6*  MCHC 35.6 35.7 35.1  RDW 14.4 14.3 14.4  PLT 181 200 219    Cardiac Enzymes  Recent Labs Lab 04/24/17 1000 04/25/17 2316 04/26/17 0530  TROPONINI 0.05* 0.21* 0.24*     Recent Labs Lab 04/24/17 1012  TROPIPOC 0.11*     BNP  Recent Labs Lab 04/24/17 1000  BNP 832.8*     DDimer   Recent Labs Lab 04/24/17 1000  DDIMER 0.92*     Radiology    Mr Brain Wo Contrast  Result Date: 04/28/2017 CLINICAL DATA:  Status post mechanical thrombectomy RIGHT M1 occlusion. Focal neurological deficit, suspect stroke. EXAM: MRI HEAD WITHOUT CONTRAST TECHNIQUE: Multiplanar,  multiecho pulse sequences of the brain and surrounding structures were obtained without intravenous contrast. COMPARISON:  CT HEAD April 26, 2017 and MRI of the head April 25, 2017 FINDINGS: BRAIN: New punctate focus reduced diffusion LEFT frontal cortex (axial diffusion weighted imaging 44/100) Reduced diffusion at RIGHT insula and posterior lenticulostriate nucleus unchanged distribution for prior imaging. Punctate focus reduced diffusion LEFT frontal and LEFT occipital lobes are unchanged. No further propagation. Normalizing ADC values. No new infarcts. No susceptibility artifact to suggest hemorrhage. A few scattered supratentorial chronic micro hemorrhages associated with chronic hypertension. Old RIGHT thalamus lacunar infarct with mineralization. Patchy to confluent supratentorial white matter FLAIR T2 hyperintensities. Old small cerebellar infarcts. Ventricles and sulci are overall normal for patient's age. No midline shift, mass effect or masses. No abnormal extra-axial fluid collections. VASCULAR: Normal major intracranial vascular flow voids present at skull base. SKULL AND UPPER CERVICAL SPINE: No abnormal sellar expansion. No suspicious calvarial bone marrow signal. Craniocervical junction maintained. SINUSES/ORBITS: LEFT maxillary mucosal retention cyst. Trace paranasal sinus mucosal thickening. Trace LEFT mastoid effusion. The included ocular globes and orbital contents are non-suspicious. OTHER: None. IMPRESSION: 1. New, acute punctate LEFT frontal lobe infarct. 2. Evolving acute to subacute RIGHT MCA territory and punctate LEFT frontal and occipital infarcts without further propagation, new infarcts or hemorrhagic conversion. 3. 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 Old RIGHT thalamus and small cerebellar infarcts. Electronically Signed   By: Awilda Metro M.D.   On: 04/28/2017 23:09    Cardiac Studies   04/18/2017 ECHO Study Conclusions - Left ventricle: The cavity  size was moderately dilated. Wall thickness was normal. Systolic function was severely reduced. The estimated ejection fraction was in the range of 20% to 25%. - Aortic  valve: There was mild regurgitation. - Mitral valve: There was mild regurgitation.    Patient Profile     71 y.o. male  with severe ischemic cardiopathy (EF 20-25%), complicated by acute pulmonary edema, acute hypoxic respiratory failure requiring intubation, hyponatremia, subsequently with acute ischemic R MCA stroke and acute right SFA-popliteal occlusion, requiring thrombectomy and thrombolytics.  Now 5 days following suspected embolic event, extubated, improved blood pressure.   Assessment & Plan    1. Acute on chronic systolic and diastolic heart failure: He has severe ischemic cardiomyopathy. He is orthopnea has improved. He was able to sleep flat last night.  He weighed 180 pounds when he saw Dr. Jens Somrenshaw in the office on August 30 and was presumed to be hypervolemic at that time. Now 198 lb. Continue intravenous furosemide, ARB, low-dose Aldactone. Potassium check daily. Continue to hold carvedilol until he is closer to euvolemic. He diuresed 1.4L yesterday and seems to have better volume status with improvement in symptoms.  Reevaluate left ventricular ejection fraction after optimization of heart failure meds, in about 3 months. May need implantable defibrillator. 2. CAD s/p CABG: He has not had angina pectoris. Nuclear stress test performed just a few days ago showed an extensive inferior and inferolateral scar and EF under 30%. Doubt acute coronary event. 3. Acute embolic right MCA stroke: Thankfully seems to have only mild residual hemiparesis, expected to improve over the next several days-weeks. On heparin, warfarin initiated. Physical therapy recommends consults to inpatient rehabilitation. 4. R popliteal acute embolic occlusion: Appears to have good pulses and perfusion of the right lower extremity. 5.  Hyponatremia: Improving, Na+ 136 today Continue diuretics 6. HLP: Target LDL <70, was 93 recently. Atorvastatin 80 mg daily was just started. Recheck lipids in 3 months 7. DM: Poorly controlled, most recent hemoglobin A1c 9.4%. Recently started on Lantus and given a glucometer.  For questions or updates, please contact [facility] Please consult [email or web address] for contact info under Cardiology/STEMI.      Signed, Berton BonJanine Hammond, NP  04/30/2017, 3:16 PM     I have seen and examined the patient along with Berton BonJanine Hammond, NP .  I have reviewed the chart, notes and new data.  I agree with NP's note.  Key new complaints: improving, but still speaks in interrupted sentences, reasonably good diuresis and substantial reduction in weight, still well above optimal fluid status. Did have trouble sleeping due to frequent nocturia Key examination changes: improved abdominal distention, S3 still present Key new findings / data: K and creat remain normal.  PLAN: Continue IV diuretics, He is not yet ready for rehab - probably another 72 h of IV diuretics. Discussed daily weights, signs and sxs of CHF exacerbation. Reviewed drug and food interactions w warfarin and need for monitoring of INR.  Thurmon FairMihai Taygen Acklin, MD, [facility] HospitalFACC [facility] [phone] 04/30/2017, 3:29 PM

## 2017-04-30 NOTE — Progress Notes (Signed)
Physical Therapy Treatment Patient Details Name: [name] MRN: [id number] DOB: Jun 19, 1946 Today's Date: 04/30/2017    History of Present Illness 71 y.o.malewith a history of MI and CABG 2007, DM type 2, HTN, stroke and HLD who presented for SOB possible CHF exacerbation. During hosptial course developed facial droop, Lt side weakness. Code stroke called, imaging CTA + Right MCA M1 embolic occlusion. Patient taken to IR and received TPA.    PT Comments    Patient continues to progress with activity tolerance and able to increase gait distance with one seated rest break and VSS. Pt required min/mod A for functional transfers and min A+2 for ambulation. Pt continues to demonstrate L LE weakness and is at increased risk for falls. Pt will continue to benefit from CIR level therapies to maximize independence and safety with mobility prior to d/c home with family assist.    Follow Up Recommendations  CIR;Supervision/Assistance - 24 hour     Equipment Recommendations  Other (comment) (TBA)    Recommendations for Other Services Rehab consult     Precautions / Restrictions Precautions Precautions: Fall Precaution Comments: left post op shoe from fractured toe     Mobility  Bed Mobility               General bed mobility comments: pt OOB in chair upon arrival  Transfers Overall transfer level: Needs assistance Equipment used: Rolling walker (2 wheeled) Transfers: Stand Pivot Transfers;Sit to/from Stand Sit to Stand: Min assist Stand pivot transfers: Mod assist       General transfer comment: assist to power up into standing, to steady upon standing and for balance when pivoting recliner <> BSC without AD and with HHA; cues for safety  Ambulation/Gait Ambulation/Gait assistance: +2 safety/equipment;Min assist;Mod assist (+2 for chair follow) Ambulation Distance (Feet): 75 Feet Assistive device: Rolling walker (2 wheeled) Gait Pattern/deviations: Decreased step length -  left;Step-through pattern;Decreased dorsiflexion - left;Trunk flexed;Decreased stance time - left Gait velocity: decreased   General Gait Details: one seated rest break with L knee buckling when fatigued; multimodal cues for posture but less required versus previous session; pt with less difficulty advancing L LE however is going into L knee hyperextension during stance phase    Stairs            Wheelchair Mobility    Modified Rankin (Stroke Patients Only) Modified Rankin (Stroke Patients Only) Pre-Morbid Rankin Score: No symptoms Modified Rankin: Moderately severe disability     Balance Overall balance assessment: Needs assistance Sitting-balance support: Feet supported Sitting balance-Leahy Scale: Fair     Standing balance support: During functional activity;Bilateral upper extremity supported Standing balance-Leahy Scale: Poor                              Cognition Arousal/Alertness: Awake/alert Behavior During Therapy: WFL for tasks assessed/performed Overall Cognitive Status: Difficult to assess Area of Impairment: Problem solving                       Following Commands: Follows one step commands consistently;Follows multi-step commands with increased time     Problem Solving: Requires verbal cues        Exercises      General Comments        Pertinent Vitals/Pain Pain Assessment: No/denies pain    Home Living  Prior Function            PT Goals (current goals can now be found in the care plan section) Acute Rehab PT Goals Patient Stated Goal: walk PT Goal Formulation: With family Time For Goal Achievement: 05/10/17 Potential to Achieve Goals: Good Progress towards PT goals: Progressing toward goals    Frequency    Min 4X/week      PT Plan Current plan remains appropriate    Co-evaluation              AM-PAC PT "6 Clicks" Daily Activity  Outcome Measure  Difficulty  turning over in bed (including adjusting bedclothes, sheets and blankets)?: A Lot Difficulty moving from lying on back to sitting on the side of the bed? : A Lot Difficulty sitting down on and standing up from a chair with arms (e.g., wheelchair, bedside commode, etc,.)?: Unable Help needed moving to and from a bed to chair (including a wheelchair)?: A Little Help needed walking in hospital room?: A Lot Help needed climbing 3-5 steps with a railing? : A Lot 6 Click Score: 12    End of Session Equipment Utilized During Treatment: Gait belt Activity Tolerance: Patient tolerated treatment well Patient left: in chair;with call bell/phone within reach;with chair alarm set Nurse Communication: Mobility status PT Visit Diagnosis: Difficulty in walking, not elsewhere classified (R26.2);Other symptoms and signs involving the nervous system (R29.898)     Time: 1610-9604 PT Time Calculation (min) (ACUTE ONLY): 46 min  Charges:  $Gait Training: 23-37 mins $Therapeutic Activity: 8-22 mins                    G Codes:       Erline Levine, PTA Pager: [phone]     Carolynne Edouard 04/30/2017, 3:28 PM

## 2017-04-30 NOTE — Progress Notes (Signed)
ANTICOAGULATION CONSULT NOTE - Follow up Consult  Pharmacy Consult for Heparin and Warfarin Indication: stroke and SFA thrombus  No Known Allergies  Patient Measurements: Height: 6' (182.9 cm) Weight: 198 lb 6.6 oz (90 kg) IBW/kg (Calculated) : 77.6 Heparin Dosing Weight: 87.7kg  Vital Signs: Temp: 98.1 F (36.7 C) (09/12 0421) Temp Source: Oral (09/12 0421) BP: 110/74 (09/12 0421) Pulse Rate: 77 (09/12 0421)  Labs:  Recent Labs  04/28/17 0436 04/28/17 1754 04/29/17 0132 04/30/17 0205  HGB 9.8*  --  10.7* 10.5*  HCT 27.5*  --  30.0* 29.9*  PLT 181  --  200 219  LABPROT  --   --  15.7* 15.7*  INR  --   --  1.26 1.26  HEPARINUNFRC 0.20* 0.28* 0.36 0.50  CREATININE 1.00  --  0.97 0.89    Estimated Creatinine Clearance: 84.8 mL/min (by C-G formula based on SCr of 0.89 mg/dL).  Medications:  Heparin @ 1400 units/hr  Assessment: 71 yom presented with an acute stroke and subsequent SFA thrombus. Received tPA 9/7 AM and now transitioned to IV heparin. Heparin level is therapeutic at 0.50 on the upper end of goal. Warfarin started yesterday 9/10. INR 1.26 and unchanged after 2 doses. CBC stable.   9/10 MRI without hemorrhagic conversion  Goal of Therapy:  INR 2-3 Heparin level 0.3-0.5 units/ml Monitor platelets by anticoagulation protocol: Yes   Plan:  1) Decrease heparin to 1350 units/hr 2) Increase warfarin to 7.5mg  tonight 3) Daily heparin level, INR, CBC  Louie CasaJennifer Dina Warbington, PharmD, BCPS  04/30/2017 8:41 AM

## 2017-04-30 NOTE — Progress Notes (Signed)
Occupational Therapy Treatment Patient Details Name: [name] MRN: [id number] DOB: 06-Aug-1946 Today's Date: 04/30/2017    History of present illness 71 y.o.malewith a history of MI and CABG 2007, DM type 2, HTN, stroke and HLD who presented for SOB possible CHF exacerbation. During hosptial course developed facial droop, Lt side weakness. Code stroke called, imaging CTA + Right MCA M1 embolic occlusion. Patient taken to IR and received TPA.   OT comments  Pt demonstrating progress toward OT goals and has met 3/4 initial goals. Updated plan of care according to progress. Pt was able to follow one-step commands consistently throughout session. He continues to demonstrate decreased attention to task and easily begins to speak off topic when distractions increase requiring VC's to return to activity. Pt was also able to complete seated grooming tasks with supervision and set-up this session. He did demonstrate decreased coordination with L UE during tooth brushing task. Noted decreased problem solving skills with seated cognitive tasks but improving visual perceptual skills. D/C recommendation remains appropriate. Will continue to follow.  Follow Up Recommendations  CIR;Supervision/Assistance - 24 hour    Equipment Recommendations  Other (comment) (defer to next venue of care)    Recommendations for Other Services Rehab consult    Precautions / Restrictions Precautions Precautions: Fall Precaution Comments: left post op shoe from fractured toe  Restrictions Weight Bearing Restrictions: No       Mobility Bed Mobility               General bed mobility comments: pt OOB in chair upon arrival  Transfers Overall transfer level: Needs assistance Equipment used: Rolling walker (2 wheeled) Transfers: Stand Pivot Transfers;Sit to/from Stand Sit to Stand: Min assist Stand pivot transfers: Mod assist       General transfer comment: assist to power up into standing, to steady  upon standing and for balance when pivoting recliner <> BSC without AD and with HHA; cues for safety    Balance Overall balance assessment: Needs assistance Sitting-balance support: Feet supported Sitting balance-Leahy Scale: Fair     Standing balance support: During functional activity;Bilateral upper extremity supported Standing balance-Leahy Scale: Poor                             ADL either performed or assessed with clinical judgement   ADL Overall ADL's : Needs assistance/impaired     Grooming: Sitting;Supervision/safety                                 General ADL Comments: Facilitated improved ability to complete grooming tasks in seated position this session. Able to follow all commands well. Facilitated improved use of L UE and noted slight inattention at times. However, there is also a cultural aspect as [place] he [place] it is highly uncommon to use non-dominant hand for any tasks.      Vision   Vision Assessment?: Yes Additional Comments: Continued difficulty with depth perception during tasks but improved from previous session. Noted that pt with greatest difficulty with L hand and deficits may be due to poor coordination.   Perception     Praxis      Cognition Arousal/Alertness: Awake/alert Behavior During Therapy: WFL for tasks assessed/performed Overall Cognitive Status: Difficult to assess Area of Impairment: Problem solving;Awareness;Safety/judgement  Current Attention Level: Selective   Following Commands: Follows one step commands consistently;Follows multi-step commands with increased time Safety/Judgement: Decreased awareness of deficits Awareness: Emergent Problem Solving: Requires verbal cues General Comments: At times, pt speaking about unrelated topics. Able to follow one-step commands throughout session appropriately.         Exercises Exercises: Other exercises Other  Exercises Other Exercises: Facilitated improved coordination of L UE with reaching tasks. Noted slow movement and decreased coordination with L UE.    Shoulder Instructions       General Comments      Pertinent Vitals/ Pain       Pain Assessment: No/denies pain  Home Living                                          Prior Functioning/Environment              Frequency  Min 3X/week        Progress Toward Goals  OT Goals(current goals can now be found in the care plan section)  Progress towards OT goals: Progressing toward goals;Goals met and updated - see care plan  Acute Rehab OT Goals Patient Stated Goal: get back to walking OT Goal Formulation: With patient ADL Goals Pt Will Perform Grooming: with min assist;standing Pt Will Transfer to Toilet: with min assist;stand pivot transfer;bedside commode Pt Will Perform Toileting - Clothing Manipulation and hygiene: with min assist;sit to/from stand Pt/caregiver will Perform Home Exercise Program: Left upper extremity;With written HEP provided;With Supervision (increased coordination) Additional ADL Goal #1: Pt will demonstrate alternating attention during seated ADL tasks in moderately distracting environment.  Additional ADL Goal #2: Pt will follow 3/4 multistep commands consistently with no verbal reminders.   Plan Discharge plan remains appropriate    Co-evaluation                 AM-PAC PT "6 Clicks" Daily Activity     Outcome Measure   Help from another person eating meals?: A Little Help from another person taking care of personal grooming?: A Little Help from another person toileting, which includes using toliet, bedpan, or urinal?: A Lot Help from another person bathing (including washing, rinsing, drying)?: A Lot Help from another person to put on and taking off regular upper body clothing?: A Lot Help from another person to put on and taking off regular lower body clothing?: A Lot 6  Click Score: 14    End of Session    OT Visit Diagnosis: Muscle weakness (generalized) (M62.81);Low vision, both eyes (H54.2);Other symptoms and signs involving the nervous system (R29.898);Other symptoms and signs involving cognitive function   Activity Tolerance Patient tolerated treatment well   Patient Left in chair;with call bell/phone within reach;with family/visitor present   Nurse Communication Mobility status        Time: 4098-1191 OT Time Calculation (min): 31 min  Charges: OT General Charges $OT Visit: 1 Visit OT Treatments $Self Care/Home Management : 8-22 mins $Therapeutic Activity: 8-22 mins  Norman Herrlich, MS OTR/L  Pager: [place] A Rachal Dvorsky 04/30/2017, 5:08 PM

## 2017-05-01 ENCOUNTER — Telehealth: Payer: Self-pay | Admitting: Vascular Surgery

## 2017-05-01 DIAGNOSIS — E1165 Type 2 diabetes mellitus with hyperglycemia: Secondary | ICD-10-CM

## 2017-05-01 DIAGNOSIS — E1151 Type 2 diabetes mellitus with diabetic peripheral angiopathy without gangrene: Secondary | ICD-10-CM

## 2017-05-01 DIAGNOSIS — I2510 Atherosclerotic heart disease of native coronary artery without angina pectoris: Secondary | ICD-10-CM

## 2017-05-01 LAB — BASIC METABOLIC PANEL
ANION GAP: 10 (ref 5–15)
BUN: 21 mg/dL — ABNORMAL HIGH (ref 6–20)
CALCIUM: 9.2 mg/dL (ref 8.9–10.3)
CO2: 24 mmol/L (ref 22–32)
CREATININE: 1.03 mg/dL (ref 0.61–1.24)
Chloride: 102 mmol/L (ref 101–111)
GFR calc non Af Amer: >60 mL/min (ref >60)
Glucose, Bld: 139 mg/dL — ABNORMAL HIGH (ref 65–99)
Potassium: 3.7 mmol/L (ref 3.5–5.1)
SODIUM: 136 mmol/L (ref 135–145)

## 2017-05-01 LAB — CBC
HCT: 30.3 % — ABNORMAL LOW (ref 39.0–52.0)
HEMOGLOBIN: 10.8 g/dL — AB (ref 13.0–17.0)
MCH: 25 pg — ABNORMAL LOW (ref 26.0–34.0)
MCHC: 35.6 g/dL (ref 30.0–36.0)
MCV: 70.1 fL — AB (ref 78.0–100.0)
PLATELETS: 240 10*3/uL (ref 150–400)
RBC: 4.32 MIL/uL (ref 4.22–5.81)
RDW: 14.5 % (ref 11.5–15.5)
WBC: 8 10*3/uL (ref 4.0–10.5)

## 2017-05-01 LAB — GLUCOSE, CAPILLARY
GLUCOSE-CAPILLARY: 163 mg/dL — AB (ref 65–99)
GLUCOSE-CAPILLARY: 155 mg/dL — AB (ref 65–99)
GLUCOSE-CAPILLARY: 190 mg/dL — AB (ref 65–99)
GLUCOSE-CAPILLARY: 168 mg/dL — AB (ref 65–99)
Glucose-Capillary: 174 mg/dL — ABNORMAL HIGH (ref 65–99)

## 2017-05-01 LAB — PROTIME-INR
INR: 1.5
Prothrombin Time: 18 seconds — ABNORMAL HIGH (ref 11.4–15.2)

## 2017-05-01 LAB — HEPARIN LEVEL (UNFRACTIONATED): HEPARIN UNFRACTIONATED: 0.5 [IU]/mL (ref 0.30–0.70)

## 2017-05-01 MED ORDER — INSULIN ASPART 100 UNIT/ML ~~LOC~~ SOLN
0.0000 [IU] | Freq: Every day | SUBCUTANEOUS | Status: DC
  Administered 2017-05-02 – 2017-05-03 (×2): 2 [IU] via SUBCUTANEOUS
  Administered 2017-05-04: 3 [IU] via SUBCUTANEOUS

## 2017-05-01 MED ORDER — INFLUENZA VAC SPLIT HIGH-DOSE 0.5 ML IM SUSY
0.5000 mL | PREFILLED_SYRINGE | INTRAMUSCULAR | Status: AC
  Administered 2017-05-03: 0.5 mL via INTRAMUSCULAR
  Filled 2017-05-01: qty 0.5

## 2017-05-01 MED ORDER — WARFARIN SODIUM 5 MG PO TABS
5.0000 mg | ORAL_TABLET | Freq: Once | ORAL | Status: DC
  Administered 2017-05-01: 5 mg via ORAL
  Filled 2017-05-01: qty 1

## 2017-05-01 MED ORDER — SPIRONOLACTONE 25 MG PO TABS
25.0000 mg | ORAL_TABLET | Freq: Every day | ORAL | Status: DC
  Administered 2017-05-02: 25 mg via ORAL
  Filled 2017-05-01: qty 1

## 2017-05-01 MED ORDER — MUPIROCIN CALCIUM 2 % EX CREA
TOPICAL_CREAM | Freq: Every day | CUTANEOUS | Status: DC
  Administered 2017-05-02 – 2017-05-05 (×4): via TOPICAL
  Filled 2017-05-01: qty 15

## 2017-05-01 MED ORDER — INSULIN ASPART 100 UNIT/ML ~~LOC~~ SOLN
0.0000 [IU] | Freq: Three times a day (TID) | SUBCUTANEOUS | Status: DC
  Administered 2017-05-02 (×2): 2 [IU] via SUBCUTANEOUS
  Administered 2017-05-02 – 2017-05-03 (×2): 3 [IU] via SUBCUTANEOUS
  Administered 2017-05-03 – 2017-05-04 (×3): 2 [IU] via SUBCUTANEOUS
  Administered 2017-05-04: 3 [IU] via SUBCUTANEOUS
  Administered 2017-05-04: 2 [IU] via SUBCUTANEOUS
  Administered 2017-05-05 (×2): 3 [IU] via SUBCUTANEOUS
  Administered 2017-05-05: 2 [IU] via SUBCUTANEOUS

## 2017-05-01 NOTE — PMR Pre-admission (Signed)
PMR Admission Coordinator Pre-Admission Assessment  Patient: [name] is an 71 y.o., male MRN: [id number] DOB: 1945/12/30 Height: 6' (182.9 cm) Weight: 79.6 kg (175 lb 6.4 oz)              Insurance Information Self pay - no insurance  Medicaid Application Date:        Case Manager:   Disability Application Date:        Case Worker:    Emergency Conservator, museum/galleryContact Information Contact Information    Name Relation Home Work Mobile   Mannan,Salimata Daughter [phone]     Sissoko,Gagni Other [phone]       Current Medical History  Patient Admitting Diagnosis:  Right insular infarct, right lower ext thromboembolectomy/edarterectomy for ischemic occlusion:  History of Present Illness: A 71 y.o.right handedlimited English-speaking malewith history of CAD/CABG 2007 in [place], diabetes mellitus, systolic congestive heart failure, hypertension, CVA as well as questionable medical compliance. Patient lives with family. Independent with a cane prior to admission. One level home with 4 steps to entry. Presented 04/24/2017 with increasing shortness of breath as well as ischemic right leg. Patient did require intubation. Chest x-ray cardiac enlargement without failure. CT of the head showed small focus of hypoattenuation within the right insula compatible with acute infarction. 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 CT angiogram showed occlusion of the right SFA just after the origin. Underwent right femoral-popliteal thromboembolectomy, right below the knee popliteal artery endarterectomy 04/25/2017 per Dr. Imogene Burnhen. Postoperatively a Foley catheter tube was placed with findings of bladder neck contracture urology consulted underwent cystoscopy for placement of Foley tube. Postoperatively noted left-sided weakness with neurology consulted and MRI showed faint ischemic changes involving the right insula out and basal ganglia.  Echocardiogram with ejection fraction of 25% diffuse hypokinesis. No cardiac source of emboli. Acute on chronic anemia 9.6-10.7. Patient currently maintained on heparin as well as Coumadin therapy.Tolerating a regular diet. Physical therapy evaluation completed and ongoing with recommendations of physical medicine rehabilitation consult.  Patient to be admitted for a comprehensive inpatient rehabilitation program.    Total: 3=NIH  Past Medical History  Past Medical History:  Diagnosis Date  . CAD (coronary artery disease) 2007   CABG 2007 in [place]  . CHF (congestive heart failure) ([name])   . Diabetes mellitus without complication ([name])   . Hyperlipidemia   . Hypertension   . Pulmonary embolism ([name])   . Stroke [facility]([name])     Family History  family history includes Hypertension in his mother and sister.  Prior Rehab/Hospitalizations: Had HHPT 2 yrs ago after a CVA  Has the patient had major surgery during 100 days prior to admission? No  Current Medications   Current Facility-Administered Medications:  .  0.9 %  sodium chloride infusion, , Intravenous, Continuous, Coralyn HellingSood, Vineet, MD, Stopped at 04/28/17 0053 .  acetaminophen (TYLENOL) tablet 650 mg, 650 mg, Oral, Q4H PRN, Berton BonHammond, Janine, NP, 650 mg at 05/04/17 2227 .  aspirin EC tablet 81 mg, 81 mg, Oral, Daily, Berton BonHammond, Janine, NP, 81 mg at 05/05/17 1003 .  atorvastatin (LIPITOR) tablet 80 mg, 80 mg, Oral, q1800, Jake BatheSkains, Mark C, MD, 80 mg at 05/04/17 1728 .  docusate sodium (COLACE) capsule 100 mg, 100 mg, Oral, Daily, Rhyne, Samantha J, PA-C, 100 mg at 05/05/17 1002 .  furosemide (LASIX) tablet 80 mg, 80 mg, Oral, Daily, Nada BoozerIngold, Laura R, NP, 80 mg at 05/05/17 1003 .  guaiFENesin-dextromethorphan (ROBITUSSIN DM) 100-10 MG/5ML syrup  15 mL, 15 mL, Oral, Q4H PRN, Rhyne, Samantha J, PA-C, 15 mL at 05/05/17 1306 .  heparin ADULT infusion 100 units/mL (25000 units/260mL sodium chloride 0.45%), 1,150 Units/hr, Intravenous, Continuous, Skains,  Mark C, MD, Last Rate: 11.5 mL/hr at 05/05/17 0954, 1,150 Units/hr at 05/05/17 0954 .  insulin aspart (novoLOG) injection 0-5 Units, 0-5 Units, Subcutaneous, QHS, Quintella Reichert, MD, 3 Units at 05/04/17 2150 .  insulin aspart (novoLOG) injection 0-9 Units, 0-9 Units, Subcutaneous, TID WC, Turner, Traci R, MD, 3 Units at 05/05/17 1254 .  losartan (COZAAR) tablet 50 mg, 50 mg, Oral, Daily, Croitoru, Mihai, MD, 50 mg at 05/05/17 1003 .  magnesium sulfate IVPB 2 g 50 mL, 2 g, Intravenous, Daily PRN, Rhyne, Samantha J, PA-C .  MEDLINE mouth rinse, 15 mL, Mouth Rinse, BID, Skains, Mark C, MD, 15 mL at 05/05/17 1008 .  morphine 2 MG/ML injection 2 mg, 2 mg, Intravenous, Q2H PRN, Rhyne, Samantha J, PA-C .  mupirocin cream (BACTROBAN) 2 %, , Topical, Daily, Skains, Mark C, MD .  ondansetron (ZOFRAN) injection 4 mg, 4 mg, Intravenous, Q6H PRN, Berton Bon, NP .  pantoprazole (PROTONIX) EC tablet 40 mg, 40 mg, Oral, Daily, Emi Holes, RPH, 40 mg at 05/05/17 1003 .  phenol (CHLORASEPTIC) mouth spray 1 spray, 1 spray, Mouth/Throat, PRN, Rhyne, Samantha J, PA-C, 1 spray at 04/28/17 1042 .  potassium chloride SA (K-DUR,KLOR-CON) CR tablet 20 mEq, 20 mEq, Oral, BID, Croitoru, Mihai, MD, 20 mEq at 05/05/17 1003 .  protein supplement (PREMIER PROTEIN) liquid, 11 oz, Oral, BID BM, Jake Bathe, MD, 11 oz at 05/04/17 1550 .  spironolactone (ALDACTONE) tablet 50 mg, 50 mg, Oral, Daily, Croitoru, Mihai, MD, 50 mg at 05/05/17 1002 .  warfarin (COUMADIN) tablet 6 mg, 6 mg, Oral, ONCE-1800, Skains, Mark C, MD .  warfarin (COUMADIN) video, , Does not apply, Once, Emi Holes, RPH .  Warfarin - Pharmacist Dosing Inpatient, , Does not apply, q1800, Bertram Millard, RPH  Patients Current Diet: Diet heart healthy/carb modified Room service appropriate? Yes; Fluid consistency: Thin  Precautions / Restrictions Precautions Precautions: Fall Precaution Comments: left post op shoe from fractured toe   Restrictions Weight Bearing Restrictions: No   Has the patient had 2 or more falls or a fall with injury in the past year?No  Prior Activity Level Community (5-7x/wk): Walks outside daily, goes out with daughter about 3 X a week, not driving.  Home Assistive Devices / Equipment Home Assistive Devices/Equipment: Cane (specify quad or straight) Home Equipment: Cane - single point  Prior Device Use: Indicate devices/aids used by the patient prior to current illness, exacerbation or injury? Straight cane for the past 1-2 weeks  Prior Functional Level Prior Function Level of Independence: Independent with assistive device(s) Comments: used SPC for ambulation  Self Care: Did the patient need help bathing, dressing, using the toilet or eating?  Independent  Indoor Mobility: Did the patient need assistance with walking from room to room (with or without device)? Independent  Stairs: Did the patient need assistance with internal or external stairs (with or without device)? Independent  Functional Cognition: Did the patient need help planning regular tasks such as shopping or remembering to take medications? Independent  Current Functional Level Cognition  Overall Cognitive Status: Within Functional Limits for tasks assessed (also difficult to assess due to use of video interpreter) Difficult to assess due to: Non-English speaking Current Attention Level: Selective Orientation Level: Oriented X4 Following Commands: Follows one step  commands consistently, Follows multi-step commands with increased time Safety/Judgement: Decreased awareness of deficits General Comments: At times, pt speaking about unrelated topics. Able to follow one-step commands throughout session appropriately.     Extremity Assessment (includes Sensation/Coordination)  Upper Extremity Assessment: Generalized weakness, LUE deficits/detail, Difficult to assess due to impaired cognition LUE Deficits / Details: LUE  seems weaker than the right in generalized terms, will continue to assess  Lower Extremity Assessment: Defer to PT evaluation    ADLs  Overall ADL's : Needs assistance/impaired Grooming: Sitting, Supervision/safety Grooming Details (indicate cue type and reason): Min assist for sitting balance. VC's to initiate grooming tasks.  Upper Body Dressing : Moderate assistance Upper Body Dressing Details (indicate cue type and reason): To change gown due to bed wet on arrival.  Lower Body Dressing: Total assistance Toilet Transfer: Minimal assistance, Moderate assistance, +2 for physical assistance Toilet Transfer Details (indicate cue type and reason): Able to power up into standing with min assist +2 and requiring mod assist +2 to maintain balance. Able to ambulate pushing recliner for UE support with min assist +2. Functional mobility during ADLs: Moderate assistance, +2 for physical assistance General ADL Comments: Facilitated improved ability to complete grooming tasks in seated position this session. Able to follow all commands well. Facilitated improved use of L UE and noted slight inattention at times. However, there is also a cultural aspect as in pt's home country he reports it is highly uncommon to use non-dominant hand for any tasks.     Mobility  Overal bed mobility: Needs Assistance Bed Mobility: Rolling, Sidelying to Sit Rolling: Min assist Sidelying to sit: Mod assist, +2 for physical assistance, HOB elevated Supine to sit: Mod assist, +2 for physical assistance, HOB elevated Sit to supine: Mod assist, +2 for physical assistance, HOB elevated General bed mobility comments: pt OOB in chair upon arrival    Transfers  Overall transfer level: Needs assistance Equipment used: Rolling walker (2 wheeled), None Transfers: Sit to/from Stand Sit to Stand: Min assist Stand pivot transfers: Mod assist General transfer comment: pt demonstrated carry over of safe hand placement; sit to stands  X3; initial stand pt was able to pull up pants with min A for balance, wide BOS, and bilat LE resting against recliner; next two trials assist to steady when transitioning hand placement to RW    Ambulation / Gait / Stairs / Wheelchair Mobility  Ambulation/Gait Ambulation/Gait assistance: +2 safety/equipment, Min assist, Mod assist (+2 for chair follow) Ambulation Distance (Feet):  (120 ft total with seated rest break) Assistive device: Rolling walker (2 wheeled) Gait Pattern/deviations: Decreased step length - left, Step-through pattern, Decreased dorsiflexion - left, Trunk flexed, Decreased stance time - left, Decreased step length - right General Gait Details: multimodal cues for posture; assist to manage RW and for balance especially when navigating objects in hallway and for turning; pt continues to demonstrate weak L hip flexors and L knee hyperextension in stance phase; difficulty with advancing L LE when becoming fatigued Gait velocity: decreased Gait velocity interpretation: Below normal speed for age/gender    Posture / Balance Dynamic Sitting Balance Sitting balance - Comments: Pt with posterior pelvic tilt and lean sitting EOB needing Min guard-Mod A for balance. When fatigued, pt with LOB posteriorly and to left. Able to initiate trunk for anterior weight shift and lean but not sustain. Difficulty regaining midline positioning following reach to the L.  Balance Overall balance assessment: Needs assistance Sitting-balance support: Feet supported Sitting balance-Leahy Scale: Fair Sitting balance -  Comments: Pt with posterior pelvic tilt and lean sitting EOB needing Min guard-Mod A for balance. When fatigued, pt with LOB posteriorly and to left. Able to initiate trunk for anterior weight shift and lean but not sustain. Difficulty regaining midline positioning following reach to the L.  Postural control: Posterior lean, Left lateral lean Standing balance support: During functional  activity, Bilateral upper extremity supported Standing balance-Leahy Scale: Poor Standing balance comment: Requires UE support in standing on back of recliner with left lateral lean. Mod assist to maintain balance in standing position.     Special needs/care consideration BiPAP/CPAP No CPM No Continuous Drip IV Heparin drip Dialysis No       Life Vest No Oxygen No Special Bed No Trach Size No Wound Vac (area) No    Skin Had a clot in his right leg, has post op staples right leg                               Bowel mgmt: Last BM 05/04/17 Bladder mgmt: Voiding in urinal, some documented incontinence Diabetic mgmt Yes, on insulin and oral medication at home    Previous Home Environment Living Arrangements: Children Available Help at Discharge: Family, Available 24 hours/day Type of Home: House Home Layout: One level Home Access: Stairs to enter Secretary/administrator of Steps: 4 Bathroom Shower/Tub: Engineer, manufacturing systems: Standard Home Care Services: No  Discharge Living Setting Plans for Discharge Living Setting: House, Lives with (comment) (Currently staying with his daughter.) Type of Home at Discharge: House Discharge Home Layout: One level Discharge Home Access: Stairs to enter Entrance Stairs-Number of Steps: 3 steps per daughter Does the patient have any problems obtaining your medications?: No  Social/Family/Support Systems Patient Roles: Spouse, Parent (Wife in [place] and daughter here in [place]) Solicitor Information: Architect - daughter  Anticipated Caregiver: Daughter Anticipated Industrial/product designer Information: Chauncey Mann - daughter, - [phone] Ability/Limitations of Caregiver: Daughter has taken up to 6 weeks FMLA from work Caregiver Availability: 24/7 Discharge Plan Discussed with Primary Caregiver: Yes Is Caregiver In Agreement with Plan?: Yes Does Caregiver/Family have Issues with Lodging/Transportation while Pt is in Rehab?:  No  Goals/Additional Needs Patient/Family Goal for Rehab: PT mod I, OT mod I and supervision goals Expected length of stay: 8-11 days Cultural Considerations: Is from [place] in [place], speaks [place], is Muslim, here visiting daughter Dietary Needs: Heart healthy, carb mod, thin liquids Equipment Needs: TBD Pt/Family Agrees to Admission and willing to participate: Yes Program Orientation Provided & Reviewed with Pt/Caregiver Including Roles  & Responsibilities: Yes  Decrease burden of Care through IP rehab admission: N/A  Possible need for SNF placement upon discharge: No  Patient Condition: This patient's medical and functional status has changed since the consult dated: 04/29/17 in which the Rehabilitation Physician determined and documented that the patient's condition is appropriate for intensive rehabilitative care in an inpatient rehabilitation facility. See "History of Present Illness" (above) for medical update. Functional changes are:  Currently requiring min/mod assist to ambulate 120 feet RW with seated rest break. Patient's medical and functional status update has been discussed with the Rehabilitation physician and patient remains appropriate for inpatient rehabilitation. Will admit to inpatient rehab today.  Preadmission Screen Completed By:  Trish Mage, 05/05/2017 3:20 PM ______________________________________________________________________   Discussed status with Dr. Allena Katz on 05/05/17 at 1545 and received telephone approval for admission today.  Admission Coordinator:  Trish Mage, time 1545/Date 05/05/17

## 2017-05-01 NOTE — Telephone Encounter (Signed)
Current admit 9/13

## 2017-05-01 NOTE — Progress Notes (Signed)
Progress Note  Patient Name: [name] Date of Encounter: 05/01/2017  Primary Cardiologist: Jens Somrenshaw  Subjective    He feels much better, although still has occasional breathing difficulty in the evenings. Complains of pain in his right third toe where he tells me he was diagnosed with a fracture that will take about 2 weeks to heal. He has a special shoe. However, he has also developed a tiny, 3-4 millimeter ulceration on the dorsal surface of the same toe. It is very painful. 1.6 L net diuresis yesterday, by in/out his fluid balance is reportedly back to where it was at admission, when he was in overt heart failure. Weight has actually dropped dramatically over the last couple of days, more than would be expected from the recorded fluid balance and he is now at the same weight that he was in clinic in late August, when he was also felt to be in overt heart failure. Creatinine and potassium levels remain normal on current medical regimen. INR 1.5.  Inpatient Medications    Scheduled Meds: . aspirin EC  81 mg Oral Daily  . atorvastatin  80 mg Oral q1800  . docusate sodium  100 mg Oral Daily  . furosemide  80 mg Intravenous Q12H  . insulin aspart  0-9 Units Subcutaneous Q4H  . losartan  50 mg Oral Daily  . mouth rinse  15 mL Mouth Rinse BID  . pantoprazole  40 mg Oral Daily  . potassium chloride  20 mEq Oral BID  . protein supplement shake  11 oz Oral BID BM  . spironolactone  12.5 mg Oral Daily  . warfarin  5 mg Oral ONCE-1800  . warfarin   Does not apply Once  . Warfarin - Pharmacist Dosing Inpatient   Does not apply q1800   Continuous Infusions: . sodium chloride Stopped (04/28/17 0053)  . heparin 1,300 Units/hr (05/01/17 0916)  . magnesium sulfate 1 - 4 g bolus IVPB     PRN Meds: acetaminophen, guaiFENesin-dextromethorphan, magnesium sulfate 1 - 4 g bolus IVPB, morphine injection, ondansetron (ZOFRAN) IV, phenol   Vital Signs    Vitals:   05/01/17 0300 05/01/17  0406 05/01/17 0500 05/01/17 0739  BP:  114/80  104/79  Pulse: 76 78 81 79  Resp: 19 (!) 28 19   Temp:  98.3 F (36.8 C)  98.2 F (36.8 C)  TempSrc:  Oral  Oral  SpO2: 100% 97% 99% 100%  Weight:   180 lb 12.4 oz (82 kg)   Height:   6' (1.829 m)     Intake/Output Summary (Last 24 hours) at 05/01/17 1022 Last data filed at 05/01/17 0409  Gross per 24 hour  Intake           242.11 ml  Output             1825 ml  Net         -1582.89 ml   Filed Weights   04/29/17 0308 04/30/17 1559 05/01/17 0500  Weight: 198 lb 6.6 oz (90 kg) 182 lb 15.7 oz (83 kg) 180 lb 12.4 oz (82 kg)    Telemetry    Sinus rhythm with occasional PVCs, overall heart rate substantially slower than it was a few days ago - Personally Reviewed  ECG    No new tracing - Personally Reviewed  Physical Exam  Sitting up in chair, looks calm and relaxed GEN: No acute distress.   Neck: No JVD Cardiac: RRR, no murmurs, rubs, S3 gallop still  present Respiratory: Clear to auscultation bilaterally. GI: Soft, nontender, non-distended  MS: No edema; No deformity. Neuro:  Nonfocal  Psych: Normal affect   Labs    Chemistry Recent Labs Lab 04/25/17 1226 04/25/17 2316  04/29/17 0132 04/30/17 0205 05/01/17 0635  NA 128* 131*  < > 134* 136 136  K 4.8 4.3  < > 3.5 3.7 3.7  CL 101 105  < > 104 106 102  CO2 18* 19*  < > 20* 23 24  GLUCOSE 246* 131*  < > 191* 174* 139*  BUN 17 15  < > 20 17 21*  CREATININE 1.06 0.87  < > 0.97 0.89 1.03  CALCIUM 8.2* 8.3*  < > 8.9 9.0 9.2  PROT  --  5.4*  --   --   --   --   ALBUMIN 3.2* 3.0*  --   --   --   --   AST  --  17  --   --   --   --   ALT  --  15*  --   --   --   --   ALKPHOS  --  50  --   --   --   --   BILITOT  --  0.4  --   --   --   --   GFRNONAA >60 >60  < > >60 >60 >60  GFRAA >60 >60  < > >60 >60 >60  ANIONGAP 9 7  < > < > = values in this interval not displayed.   Hematology Recent Labs Lab 04/29/17 0132 04/30/17 0205 05/01/17 0635  WBC 10.0  8.0 8.0  RBC 4.32 4.27 4.32  HGB 10.7* 10.5* 10.8*  HCT 30.0* 29.9* 30.3*  MCV 69.4* 70.0* 70.1*  MCH 24.8* 24.6* 25.0*  MCHC 35.7 35.1 35.6  RDW 14.3 14.4 14.5  PLT 200 219 240    Cardiac Enzymes Recent Labs Lab 04/25/17 2316 04/26/17 0530  TROPONINI 0.21* 0.24*   No results for input(s): TROPIPOC in the last 168 hours.   BNPNo results for input(s): BNP, PROBNP in the last 168 hours.   DDimer No results for input(s): DDIMER in the last 168 hours.   Radiology    No results found.  Cardiac Studies   04/18/2017 ECHO Study Conclusions - Left ventricle: The cavity size was moderately dilated. Wall thickness was normal. Systolic function was severely reduced. The estimated ejection fraction was in the range of 20% to 25%. - Aortic valve: There was mild regurgitation. - Mitral valve: There was mild regurgitation.   Patient Profile     71 y.o. male with severe ischemic cardiopathy (EF 20-25%), complicated by acute pulmonary edema, acute hypoxic respiratory failure requiring intubation, hyponatremia, subsequently with acute ischemic R MCAstroke and acute right SFA-popliteal occlusion, requiring thrombectomy and thrombolytics.  Now 6 days following suspected embolic event. Warfarin initiated, still on IV heparin. Gradual improvement in heart failure symptoms with diuretics.   Assessment & Plan    1. Acute on chronic systolic and diastolic heart failure:He has severe ischemic cardiomyopathy. We are now at his initial hospitalization weight, identical to his weight when he was last seen in the office and felt to be in heart failure on both occasions. Clinically much improved, but still clearly volume overloaded. Continue intravenous furosemide for another 24 hours, then switch to oral diuretics tomorrow. Continue ARB, increase the dose of Aldactone. Potassium check daily.We'll try to discontinue the potassium supplements after we  switched to oral loop diuretic. Restart  carvedilol in very low dose in the next 24-48 hours.  Transfer to telemetry Reevaluate left ventricular ejection fraction after optimization of heart failure meds, in about 3 months. May need implantable defibrillator. 2. CAD s/p CABG: He remains free of angina . Nuclear stress test performed just a few days ago showed an extensive inferior and inferolateral scar and EF under 30%. Doubt acute coronary event. 3. Acute embolic right MCA stroke: On heparin, warfarin initiated, INR today 1.5. DC heparin when INR 2.0. He will probably be ready to go to inpatient rehabilitation in the next 24-48 hours or so 4. R popliteal acute embolic occlusion: Appears to have good pulses and perfusion of the right lower extremity. We'll ask wound care to evaluate the small ulcer on his right third toe. Ask wound care nurse to evaluate. 5. Hyponatremia:Resolved with diuresis, was a sign of severe heart failure. 6. WUJ:WJXBJY LDL <70, was 93 recently. Atorvastatin 80 mg daily was just started. Recheck lipids in 3 months 7. NW:GNFAOZ controlled, most recent hemoglobin A1c 9.4%. Recently started on Lantus and given a glucometer. Keep close eye on the toe ulcer because of the diabetes.  For questions or updates, please contact [facility] Please consult [email or web address] for contact info under Cardiology/STEMI.      Signed, Thurmon Fair, MD  05/01/2017, 10:22 AM

## 2017-05-01 NOTE — Progress Notes (Signed)
ANTICOAGULATION CONSULT NOTE - Follow up Consult  Pharmacy Consult for Heparin and Warfarin Indication: stroke and SFA thrombus  No Known Allergies  Patient Measurements: Height: 6' (182.9 cm) Weight: 180 lb 12.4 oz (82 kg) IBW/kg (Calculated) : 77.6 Heparin Dosing Weight: 87.7kg  Vital Signs: Temp: 98.2 F (36.8 C) (09/13 0739) Temp Source: Oral (09/13 0739) BP: 104/79 (09/13 0739) Pulse Rate: 79 (09/13 0739)  Labs:  Recent Labs  04/29/17 0132 04/30/17 0205 05/01/17 0635  HGB 10.7* 10.5* 10.8*  HCT 30.0* 29.9* 30.3*  PLT 200 219 240  LABPROT 15.7* 15.7* 18.0*  INR 1.26 1.26 1.50  HEPARINUNFRC 0.36 0.50 0.50  CREATININE 0.97 0.89 1.03    Estimated Creatinine Clearance: 73.2 mL/min (by C-G formula based on SCr of 1.03 mg/dL).  Medications:  Heparin @ 1350 units/hr  Assessment: 71 yom presented with an acute stroke and subsequent SFA thrombus. Received tPA 9/7 AM and now transitioned to IV heparin. MRI on 9/10 showing without hemorrhagic conversion. No anticoagulation prior to admission.   Heparin level remains therapeutic at 0.50 (upper end of goal). Rate was reduced slightly yesterday to 1350 units/hr. H/H and platelets remain stable. Warfarin started on 9/10. INR increased from 1.26 to 1.5 after 3 doses of warfarin. Dose was increased from 5 mg to 7.5 mg yesterday. No new medication interactions. Starting to see warfarin effects today.   Goal of Therapy:  INR 2-3 Heparin level 0.3-0.5 units/ml Monitor platelets by anticoagulation protocol: Yes   Plan:  1) Decrease heparin to 1300 units/hr 2) Continue warfarin at 5 mg tonight 3) Daily heparin level, INR, CBC  Girard CooterKimberly Perkins, PharmD Clinical Pharmacist  Phone: [phone]  05/01/2017 9:08 AM

## 2017-05-01 NOTE — Progress Notes (Signed)
Inpatient Diabetes Program Recommendations  AACE/ADA: New Consensus Statement on Inpatient Glycemic Control (2015)  Target Ranges:  Prepandial:   less than 140 mg/dL      Peak postprandial:   less than 180 mg/dL (1-2 hours)      Critically ill patients:  140 - 180 mg/dL   Lab Results  Component Value Date   GLUCAP 163 (H) 05/01/2017   HGBA1C 8.8 (H) 04/25/2017    Review of Glycemic Control  Results for [name], [name] (MRN [id number]) as of 05/01/2017 09:44  Ref. Range 04/30/2017 16:12 04/30/2017 19:54 04/30/2017 23:44 05/01/2017 04:08 05/01/2017 08:35  Glucose-Capillary Latest Ref Range: 65 - 99 mg/dL 409219 (H) 811153 (H) 914184 (H) 163 (H) 155 (H)   Diabetes history: Type 2 Outpatient Diabetes medications: Lantus 5 units qam, Metformin 1000mg  qam Current orders for Inpatient glycemic control: Novolog 0-9 units q4h  Inpatient Diabetes Program Recommendations: Now that the patient has a diet ordered, consider changing Novolog 0-9 units tid and add Novolog 0-5 units qhs.  Consider restarting her home dose of Lantus 5 units qhs.   Susette RacerJulie Akua Blethen, RN, BA, [place]HA, CDE Diabetes Coordinator Inpatient Diabetes Program  [phone] (Team Pager) [phone] [facility] Office) 05/01/2017 9:45 AM

## 2017-05-01 NOTE — Consult Note (Addendum)
WOC Nurse wound consult note Reason for Consult: Consult requested for right 3rd toe.  Pt states he injured his toe a few weeks ago by hitting it on a chair.  He had an x-ray which indicated an avulsion, osteoarthritic changes, and spurs.  Foot was evaluated in the ER at that time and no further treatment was ordered except a blue post-op shoe. Wound type: There is currently no open wound.  Pt has a dry scabbed area .5X.5cm between the 3rd and 4th toes where the injury previously occurred, he is requesting antibiotic ointment and something to help with neuropathic pain, "which comes from the inside and pain is very bad."  This information was obtained from a family member translating at the bedside.  Generalized edema to right toes; there no open wound, drainage, or fluctuance. Dressing procedure/placement/frequency: Bactroban to affected area Q day as requested, discussed plan of care with pt and family member. Primary team: Patient states he is having a large amt neuropathic pain to his feet.  Please consider ordering medication if this is appropriate.   Please re-consult if further assistance is needed.  Thank-you,  Cammie Mcgeeawn Simmie Camerer MSN, RN, CWOCN, [place]AP, CNS [phone]

## 2017-05-01 NOTE — Progress Notes (Signed)
   Daily Progress Note   Assessment/Planning:   POD #6 s/p TE R fem-pop, R BK pop EA w/ BPA, proximal EA R TPT and peroneal artery for likely sub-acute R fem-pop thrombosis   Right foot is viable and asx currently  Staples out in another 4-7 days  Looks like might be CIR candidate   Subjective  - 6 Days Post-Op   No complaints   Objective   Vitals:   05/01/17 0300 05/01/17 0406 05/01/17 0500 05/01/17 0739  BP:  114/80  104/79  Pulse: 76 78 81 79  Resp: 19 (!) 28 19   Temp:  98.3 F (36.8 C)  98.2 F (36.8 C)  TempSrc:  Oral  Oral  SpO2: 100% 97% 99% 100%  Weight:   180 lb 12.4 oz (82 kg)   Height:   6' (1.829 m)      Intake/Output Summary (Last 24 hours) at 05/01/17 1055 Last data filed at 05/01/17 0409  Gross per 24 hour  Intake           242.11 ml  Output             1825 ml  Net         -1582.89 ml   R foot warm and viable, R calf inc c/d/i, no drainage  Laboratory   CBC CBC Latest Ref Rng & Units 05/01/2017 04/30/2017 04/29/2017  WBC 4.0 - 10.5 K/uL 8.0 8.0 10.0  Hemoglobin 13.0 - 17.0 g/dL 10.8(L) 10.5(L) 10.7(L)  Hematocrit 39.0 - 52.0 % 30.3(L) 29.9(L) 30.0(L)  Platelets 150 - 400 K/uL 240 219 200    BMET    Component Value Date/Time   NA 136 05/01/2017 0635   K 3.7 05/01/2017 0635   CL 102 05/01/2017 0635   CO2 24 05/01/2017 0635   GLUCOSE 139 (H) 05/01/2017 0635   BUN 21 (H) 05/01/2017 0635   CREATININE 1.03 05/01/2017 0635   CALCIUM 9.2 05/01/2017 0635   GFRNONAA >60 05/01/2017 0635   GFRAA >60 05/01/2017 16100635     Leonides SakeBrian Shakora Nordquist, MD, FACS [facility] Office: [phone] Pager: [phone]  05/01/2017, 10:55 AM

## 2017-05-01 NOTE — Progress Notes (Signed)
Physical Therapy Treatment Patient Details Name: [name] MRN: [id number] DOB: March 14, 1946 Today's Date: 05/01/2017    History of Present Illness 71 y.o.malewith a history of MI and CABG 2007, DM type 2, HTN, stroke and HLD who presented for SOB possible CHF exacerbation. During hosptial course developed facial droop, Lt side weakness. Code stroke called, imaging CTA + Right MCA M1 embolic occlusion. Patient taken to IR and received TPA.    PT Comments    Patient continues to be very motivated and eager to mobilize. Pt tolerated increased gait distance this session before requiring seated break. Pt continues to demonstrate L side weakness and unable to maintain L knee stability without hyperextension. Continue to progress as tolerated.    Follow Up Recommendations  CIR;Supervision/Assistance - 24 hour     Equipment Recommendations  Other (comment) (TBA)    Recommendations for Other Services Rehab consult     Precautions / Restrictions Precautions Precautions: Fall Precaution Comments: left post op shoe from fractured toe  Restrictions Weight Bearing Restrictions: No    Mobility  Bed Mobility               General bed mobility comments: pt OOB in chair upon arrival  Transfers Overall transfer level: Needs assistance Equipment used: Rolling walker (2 wheeled);None Transfers: Sit to/from Stand Sit to Stand: Min assist         General transfer comment: pt demonstrated carry over of safe hand placement; sit to stands X3; initial stand pt was able to pull up pants with min A for balance, wide BOS, and bilat LE resting against recliner; next two trials assist to steady when transitioning hand placement to RW  Ambulation/Gait Ambulation/Gait assistance: +2 safety/equipment;Min assist;Mod assist (+2 for chair follow) Ambulation Distance (Feet):  (120 ft total with seated rest break) Assistive device: Rolling walker (2 wheeled) Gait Pattern/deviations: Decreased step  length - left;Step-through pattern;Decreased dorsiflexion - left;Trunk flexed;Decreased stance time - left;Decreased step length - right Gait velocity: decreased   General Gait Details: multimodal cues for posture; assist to manage RW and for balance especially when navigating objects in hallway and for turning; pt continues to demonstrate weak L hip flexors and L knee hyperextension in stance phase; difficulty with advancing L LE when becoming fatigued   Stairs            Wheelchair Mobility    Modified Rankin (Stroke Patients Only) Modified Rankin (Stroke Patients Only) Pre-Morbid Rankin Score: No symptoms Modified Rankin: Moderately severe disability     Balance Overall balance assessment: Needs assistance Sitting-balance support: Feet supported Sitting balance-Leahy Scale: Fair     Standing balance support: During functional activity;Bilateral upper extremity supported Standing balance-Leahy Scale: Poor                              Cognition Arousal/Alertness: Awake/alert Behavior During Therapy: WFL for tasks assessed/performed Overall Cognitive Status: Within Functional Limits for tasks assessed (also difficult to assess due to use of video interpreter)                                        Exercises General Exercises - Lower Extremity Long Arc Quad: AROM;Left;10 reps;Seated (with resistance) Hip Flexion/Marching: AROM;Left;10 reps;Seated (with resistance) Other Exercises Other Exercises: pt educated on coordination exercises for L LE Other Exercises: seated knee flexion with resistance X10  General Comments General comments (skin integrity, edema, etc.): pt is unable to maintain L knee stability without hyperextension      Pertinent Vitals/Pain Pain Assessment: No/denies pain    Home Living                      Prior Function            PT Goals (current goals can now be found in the care plan section) Acute  Rehab PT Goals Patient Stated Goal: get back to walking PT Goal Formulation: With family Time For Goal Achievement: 05/10/17 Potential to Achieve Goals: Good Progress towards PT goals: Progressing toward goals    Frequency    Min 4X/week      PT Plan Current plan remains appropriate    Co-evaluation              AM-PAC PT "6 Clicks" Daily Activity  Outcome Measure  Difficulty turning over in bed (including adjusting bedclothes, sheets and blankets)?: A Little Difficulty moving from lying on back to sitting on the side of the bed? : A Little Difficulty sitting down on and standing up from a chair with arms (e.g., wheelchair, bedside commode, etc,.)?: Unable Help needed moving to and from a bed to chair (including a wheelchair)?: A Little Help needed walking in hospital room?: A Lot Help needed climbing 3-5 steps with a railing? : A Lot 6 Click Score: 14    End of Session Equipment Utilized During Treatment: Gait belt Activity Tolerance: Patient tolerated treatment well Patient left: in chair;with call bell/phone within reach;with chair alarm set Nurse Communication: Mobility status PT Visit Diagnosis: Difficulty in walking, not elsewhere classified (R26.2);Other symptoms and signs involving the nervous system (R29.898)     Time: 1610-9604 PT Time Calculation (min) (ACUTE ONLY): 44 min  Charges:  $Gait Training: 23-37 mins $Therapeutic Exercise: 8-22 mins                    G Codes:       Erline Levine, PTA Pager: [phone]     Carolynne Edouard 05/01/2017, 10:58 AM

## 2017-05-01 NOTE — Telephone Encounter (Signed)
Sched appt 06/06/17 at 1:30. Spoke to pt.

## 2017-05-01 NOTE — Care Management Note (Signed)
Case Management Note  Patient Details  Name: [name] MRN: [id number] Date of Birth: 1945/11/25  Subjective/Objective:     S/p Right femoropopliteal thromboembolectomy Endarterectomy right below-the-knee popliteal artery with bovine patch angioplasty Proximal endarterectomy of right tibioperoneal trunk and peroneal artery               Action/Plan:  PTA independent from home.  Pt is ventilated with daughter at bedside.  Pt is active with [facility] and gets medications from there - attending would need to ensure at discharge pt is on the least expensive but effective drug.  Due to pt not being a [place] Citizen - assistance programs can not be utilized.  CM will continue to follow for discharge needs   Expected Discharge Date:                  Expected Discharge Plan:  Home/Self Care  In-House Referral:     Discharge planning Services  CM Consult  Post Acute Care Choice:    Choice offered to:     DME Arranged:    DME Agency:     HH Arranged:    HH Agency:     Status of Service:     If discussed at [employer] Length of Stay Meetings, dates discussed:    Additional Comments: 05/01/2017  CIR actively looking at pt, has SNF as back up plan (will need to be placed on difficult to place list) .  Pt remains on IV heparin  04/28/17 CIR recommended - CSW following for back up plan Cherylann ParrClaxton, Rashi Granier S, RN 05/01/2017, 3:47 PM

## 2017-05-01 NOTE — Progress Notes (Signed)
Rehab admissions - I received a call from unit case manager that patient was ready today for inpatient rehab.  Unfortunately, I did not have a rehab bed available today.  I will follow up in am for bed availability and medical readiness.  Call me for questions.  #[phone]

## 2017-05-01 NOTE — Progress Notes (Signed)
  Speech Language Pathology Patient Details Name: [name] MRN: [id number] DOB: Aug 14, 1946 Today's Date: 05/01/2017 Time:  -    Speech-language-cognitive assessment ordered. Pt did not report difficulties in these areas and friend did not have concerns. Pt seen 9/11 for follow up with dysphagia and appeared functional for the acute setting for cognition-speech-language. Plans are for possible CIR once medically stable. Would benefit from ST assessment of speech and executive functions at that time.        [name], [name] 05/01/2017, 1:53 PM  [name] [name] Pager [phone]

## 2017-05-01 NOTE — Telephone Encounter (Signed)
-----   Message from Sharee PimpleMarilyn K McChesney, RN sent at 05/01/2017 11:37 AM EDT ----- Regarding: 4-6 weeks    ----- Message ----- From: Fransisco Hertzhen, Brian L, MD Sent: 05/01/2017  10:58 AM To: Vvs Charge Pool  Gardiner Rhymessa Woodle [id number] 03-Sep-1945  Follow-up: 4-6 weeks for R leg thrombectomy

## 2017-05-01 NOTE — Progress Notes (Signed)
Received patient from 2C, with Heparin drip infusing 3313ml/hr. Patient alert and oriented, Wife at bedside.

## 2017-05-02 LAB — BASIC METABOLIC PANEL
ANION GAP: 10 (ref 5–15)
BUN: 21 mg/dL — ABNORMAL HIGH (ref 6–20)
CALCIUM: 9.6 mg/dL (ref 8.9–10.3)
CO2: 24 mmol/L (ref 22–32)
Chloride: 102 mmol/L (ref 101–111)
Creatinine, Ser: 1.1 mg/dL (ref 0.61–1.24)
Glucose, Bld: 198 mg/dL — ABNORMAL HIGH (ref 65–99)
Potassium: 3.9 mmol/L (ref 3.5–5.1)
Sodium: 136 mmol/L (ref 135–145)

## 2017-05-02 LAB — CBC
HCT: 31.7 % — ABNORMAL LOW (ref 39.0–52.0)
HEMOGLOBIN: 11.3 g/dL — AB (ref 13.0–17.0)
MCH: 25 pg — AB (ref 26.0–34.0)
MCHC: 35.6 g/dL (ref 30.0–36.0)
MCV: 70.1 fL — ABNORMAL LOW (ref 78.0–100.0)
Platelets: 252 10*3/uL (ref 150–400)
RBC: 4.52 MIL/uL (ref 4.22–5.81)
RDW: 14.5 % (ref 11.5–15.5)
WBC: 8.4 10*3/uL (ref 4.0–10.5)

## 2017-05-02 LAB — PROTIME-INR
INR: 1.58
PROTHROMBIN TIME: 18.7 s — AB (ref 11.4–15.2)

## 2017-05-02 LAB — HEPARIN LEVEL (UNFRACTIONATED): Heparin Unfractionated: 0.51 IU/mL (ref 0.30–0.70)

## 2017-05-02 LAB — GLUCOSE, CAPILLARY
GLUCOSE-CAPILLARY: 197 mg/dL — AB (ref 65–99)
GLUCOSE-CAPILLARY: 199 mg/dL — AB (ref 65–99)
Glucose-Capillary: 202 mg/dL — ABNORMAL HIGH (ref 65–99)
Glucose-Capillary: 238 mg/dL — ABNORMAL HIGH (ref 65–99)

## 2017-05-02 MED ORDER — SPIRONOLACTONE 25 MG PO TABS
50.0000 mg | ORAL_TABLET | Freq: Every day | ORAL | Status: DC
  Administered 2017-05-03 – 2017-05-05 (×3): 50 mg via ORAL
  Filled 2017-05-02 (×3): qty 2

## 2017-05-02 MED ORDER — WARFARIN SODIUM 5 MG PO TABS
5.0000 mg | ORAL_TABLET | Freq: Once | ORAL | Status: AC
  Administered 2017-05-02: 5 mg via ORAL
  Filled 2017-05-02: qty 1

## 2017-05-02 MED ORDER — METOLAZONE 5 MG PO TABS
5.0000 mg | ORAL_TABLET | Freq: Once | ORAL | Status: AC
  Administered 2017-05-02: 5 mg via ORAL
  Filled 2017-05-02: qty 1

## 2017-05-02 NOTE — Progress Notes (Signed)
Called swat RN to complete pt daily NIH assessment

## 2017-05-02 NOTE — Progress Notes (Signed)
Rehab admissions - I checked with attending MD today and patient is not ready for CIR today.  I will check back on Monday for progress.  #[phone]

## 2017-05-02 NOTE — Progress Notes (Signed)
ANTICOAGULATION CONSULT NOTE - Follow up Consult  Pharmacy Consult for Heparin and Warfarin Indication: stroke and SFA thrombus  No Known Allergies  Patient Measurements: Height: 6' (182.9 cm) Weight: 191 lb 12.8 oz (87 kg) (pt to weak to stand) IBW/kg (Calculated) : 77.6 Heparin Dosing Weight: 87.7kg  Vital Signs: Temp: 97.7 F (36.5 C) (09/14 0534) Temp Source: Oral (09/14 0534) BP: 112/64 (09/14 0534) Pulse Rate: 82 (09/14 0534)  Labs:  Recent Labs  04/30/17 0205 05/01/17 0635 05/02/17 0451  HGB 10.5* 10.8* 11.3*  HCT 29.9* 30.3* 31.7*  PLT 219 240 252  LABPROT 15.7* 18.0* 18.7*  INR 1.26 1.50 1.58  HEPARINUNFRC 0.50 0.50 0.51  CREATININE 0.89 1.03 1.10    Estimated Creatinine Clearance: 68.6 mL/min (by C-G formula based on SCr of 1.1 mg/dL).  Assessment: 69 yom presented with an acute stroke and subsequent SFA thrombus. Received tPA 9/7 AM and now transitioned to IV heparin. MRI on 9/10 showing without hemorrhagic conversion. No anticoagulation prior to admission.   On heparin for SFA thrombus + CVA s/p R leg thrombectomy. Coumadin started on 9/10. Heparin level 0.51, INR up to 1.58 today. Hgb 11.3, plts wnl.  Goal of Therapy:  INR 2-3 Heparin level 0.3-0.5 units/ml Monitor platelets by anticoagulation protocol: Yes   Plan:  Decrease heparin gtt to 1,250 units/hr Give Coumadin  PO x 1 Monitor daily heparin level / INR, CBC, s/s of bleed  Enzo Bi, PharmD, [facility] Clinical Pharmacist Pager [phone] 05/02/2017 7:38 AM

## 2017-05-02 NOTE — Progress Notes (Signed)
Progress Note  Patient Name: [name] Date of Encounter: 05/02/2017  Primary Cardiologist: Jens Som  Subjective   Feeling worse today. Did not sleep all night. Urine output diminished. Weight reportedly up over 10 pounds, although I believe this was an error. (He was unable to stand up this morning and was weighed with the bed scale as opposed to the upright scale; last evening 182 pounds with the upright scale).  Inpatient Medications    Scheduled Meds: . aspirin EC  81 mg Oral Daily  . atorvastatin  80 mg Oral q1800  . docusate sodium  100 mg Oral Daily  . furosemide  80 mg Intravenous Q12H  . Influenza vac split quadrivalent PF  0.5 mL Intramuscular Tomorrow-1000  . insulin aspart  0-5 Units Subcutaneous QHS  . insulin aspart  0-9 Units Subcutaneous TID WC  . losartan  50 mg Oral Daily  . mouth rinse  15 mL Mouth Rinse BID  . mupirocin cream   Topical Daily  . pantoprazole  40 mg Oral Daily  . potassium chloride  20 mEq Oral BID  . protein supplement shake  11 oz Oral BID BM  . spironolactone  25 mg Oral Daily  . warfarin  5 mg Oral ONCE-1800  . warfarin   Does not apply Once  . Warfarin - Pharmacist Dosing Inpatient   Does not apply q1800   Continuous Infusions: . sodium chloride Stopped (04/28/17 0053)  . heparin 1,250 Units/hr (05/02/17 0737)  . magnesium sulfate 1 - 4 g bolus IVPB     PRN Meds: acetaminophen, guaiFENesin-dextromethorphan, magnesium sulfate 1 - 4 g bolus IVPB, morphine injection, ondansetron (ZOFRAN) IV, phenol   Vital Signs    Vitals:   05/02/17 0534 05/02/17 0802 05/02/17 1106 05/02/17 1203  BP: 112/64  (!) 97/50 109/79  Pulse: 82 88 84 82  Resp: Temp: 97.7 F (36.5 C)   98.3 F (36.8 C)  TempSrc: Oral   Oral  SpO2: 94% 100% 100% 100%  Weight: 191 lb 12.8 oz (87 kg)     Height:        Intake/Output Summary (Last 24 hours) at 05/02/17 1439 Last data filed at 05/02/17 1428  Gross per 24 hour  Intake           870.55  ml  Output             1751 ml  Net          -880.45 ml   Filed Weights   05/01/17 0500 05/01/17 1749 05/02/17 0534  Weight: 180 lb 12.4 oz (82 kg) 182 lb 4.8 oz (82.7 kg) 191 lb 12.8 oz (87 kg)    Telemetry    Sinus rhythm with PACs and PVCs - Personally Reviewed  ECG    No new tracing - Personally Reviewed  Physical Exam  Weak and sleepy GEN: No acute distress.   Neck:  4-5 centimeter JVP Cardiac: RRR, no murmurs, rubs, S3 gallop present.  Respiratory: Clear to auscultation bilaterally. GI: Soft, nontender, non-distended  MS: No edema; No deformity. Extremities remain warm Neuro:  Nonfocal  Psych: Normal affect   Labs    Chemistry Recent Labs Lab 04/25/17 2316  04/30/17 0205 05/01/17 0635 05/02/17 0451  NA 131*  < > 136 136 136  K 4.3  < > 3.7 3.7 3.9  CL 105  < > 106 102 102  CO2 19*  < > GLUCOSE 131*  < >  174* 139* 198*  BUN 15  < > 17 21* 21*  CREATININE 0.87  < > 0.89 1.03 1.10  CALCIUM 8.3*  < > 9.0 9.2 9.6  PROT 5.4*  --   --   --   --   ALBUMIN 3.0*  --   --   --   --   AST 17  --   --   --   --   ALT 15*  --   --   --   --   ALKPHOS 50  --   --   --   --   BILITOT 0.4  --   --   --   --   GFRNONAA >60  < > >60 >60 >60  GFRAA >60  < > >60 >60 >60  ANIONGAP 7  < > < > = values in this interval not displayed.   Hematology Recent Labs Lab 04/30/17 0205 05/01/17 0635 05/02/17 0451  WBC 8.0 8.0 8.4  RBC 4.27 4.32 4.52  HGB 10.5* 10.8* 11.3*  HCT 29.9* 30.3* 31.7*  MCV 70.0* 70.1* 70.1*  MCH 24.6* 25.0* 25.0*  MCHC 35.1 35.6 35.6  RDW 14.4 14.5 14.5  PLT 219 240 252    Cardiac Enzymes Recent Labs Lab 04/25/17 2316 04/26/17 0530  TROPONINI 0.21* 0.24*   No results for input(s): TROPIPOC in the last 168 hours.   BNPNo results for input(s): BNP, PROBNP in the last 168 hours.   DDimer No results for input(s): DDIMER in the last 168 hours.   Radiology    No results found.  Cardiac Studies   04/18/2017  ECHO Study Conclusions - Left ventricle: The cavity size was moderately dilated. Wall thickness was normal. Systolic function was severely reduced. The estimated ejection fraction was in the range of 20% to 25%. - Aortic valve: There was mild regurgitation. - Mitral valve: There was mild regurgitation.   Patient Profile  71 y.o.malewith severe ischemic cardiopathy (EF 20-25%), complicated by acute pulmonary edema, acute hypoxic respiratory failure requiring intubation, hyponatremia, subsequently with acute ischemic R MCAstroke and acute right SFA-popliteal occlusion, requiring thrombectomy and thrombolytics.  Now 6 days following suspected embolic event. Warfarin initiated, still on IV heparin. Gradual improvement in heart failure symptoms with diuretics.  Assessment & Plan    1. Acute on chronic systolic and diastolic heart failure:He has severe ischemic cardiomyopathy. I believe he is still hypervolemic. We'll add metolazone to diuretics and increase the dose of Aldactone further. Blood pressure still holding out reasonably well and renal function only now showing some subtle signs of elevation and BUN, creatinine frankly normal . Premature to start beta blockers. May need CHF service consult. Not ready for transfer to inpatient rehabilitation. Reevaluate need for inpatient rehabilitation on Monday. Reevaluate left ventricular ejection fraction after optimization of heart failure meds, in about 3 months. May need implantable defibrillator. 2. CAD s/p CABG: He remains free of angina . Nuclear stress test performed just a few days ago showed an extensive inferior and inferolateral scar and EF under 30%. No evidence for acute coronary event 3. Acute embolic right MCA stroke: On heparin, warfarin initiated, INR today 1.58. DC heparin when INR 2.0. Plan to maintain on lifelong anticoagulation. May need to adjust agent depending on availability of prothrombin time monitoring versus availability  of thorax in [place]. He will probably be here for a few months at least. 4. R popliteal acute embolic occlusion: Seen today by Dr. Imogene Burn, good progress  with healing of thrombectomy site. 5. Hyponatremia:Resolved with diuresis, was a sign of severe heart failure. 6. ZOX:WRUEAV LDL <70, was 93 recently. Atorvastatin 80 mg daily was just started. Recheck lipids in 3 months 7. WU:JWJXBJ controlled, most recent hemoglobin A1c 9.4%. Recently started on Lantus and given a glucometer. Keep close eye on the toe ulcer because of the diabetes.   For questions or updates, please contact [facility] Please consult [email or web address] for contact info under Cardiology/STEMI.      Signed, Thurmon Fair, MD  05/02/2017, 2:39 PM

## 2017-05-02 NOTE — Telephone Encounter (Signed)
Patient is still in hospital.  will close encounter.

## 2017-05-02 NOTE — Consult Note (Signed)
Please see consultation note from [facility] nurse team from 05/01/17, orders in the computer for care.    Thanks  Chanson Teems [employer], RN,CWOCN, CNS, CWON-AP [phone])

## 2017-05-03 ENCOUNTER — Other Ambulatory Visit: Payer: Self-pay

## 2017-05-03 DIAGNOSIS — I25810 Atherosclerosis of coronary artery bypass graft(s) without angina pectoris: Secondary | ICD-10-CM

## 2017-05-03 LAB — GLUCOSE, CAPILLARY
GLUCOSE-CAPILLARY: 180 mg/dL — AB (ref 65–99)
GLUCOSE-CAPILLARY: 185 mg/dL — AB (ref 65–99)
GLUCOSE-CAPILLARY: 227 mg/dL — AB (ref 65–99)
Glucose-Capillary: 202 mg/dL — ABNORMAL HIGH (ref 65–99)

## 2017-05-03 LAB — BASIC METABOLIC PANEL
ANION GAP: 12 (ref 5–15)
ANION GAP: 14 (ref 5–15)
BUN: 20 mg/dL (ref 6–20)
BUN: 23 mg/dL — ABNORMAL HIGH (ref 6–20)
CALCIUM: 9.5 mg/dL (ref 8.9–10.3)
CALCIUM: 9.2 mg/dL (ref 8.9–10.3)
CO2: 23 mmol/L (ref 22–32)
CO2: 24 mmol/L (ref 22–32)
Chloride: 100 mmol/L — ABNORMAL LOW (ref 101–111)
Chloride: 96 mmol/L — ABNORMAL LOW (ref 101–111)
Creatinine, Ser: 1.11 mg/dL (ref 0.61–1.24)
Creatinine, Ser: 1.15 mg/dL (ref 0.61–1.24)
GFR calc Af Amer: >60 mL/min (ref >60)
GFR calc Af Amer: >60 mL/min (ref >60)
GFR calc non Af Amer: >60 mL/min (ref >60)
GFR calc non Af Amer: >60 mL/min (ref >60)
GLUCOSE: 181 mg/dL — AB (ref 65–99)
Glucose, Bld: 208 mg/dL — ABNORMAL HIGH (ref 65–99)
Potassium: 3.8 mmol/L (ref 3.5–5.1)
Potassium: 3.9 mmol/L (ref 3.5–5.1)
Sodium: 135 mmol/L (ref 135–145)
Sodium: 134 mmol/L — ABNORMAL LOW (ref 135–145)

## 2017-05-03 LAB — CBC
HEMATOCRIT: 32.4 % — AB (ref 39.0–52.0)
Hemoglobin: 11.8 g/dL — ABNORMAL LOW (ref 13.0–17.0)
MCH: 25.6 pg — ABNORMAL LOW (ref 26.0–34.0)
MCHC: 36.4 g/dL — AB (ref 30.0–36.0)
MCV: 70.3 fL — ABNORMAL LOW (ref 78.0–100.0)
PLATELETS: 238 10*3/uL (ref 150–400)
RBC: 4.61 MIL/uL (ref 4.22–5.81)
RDW: 14.6 % (ref 11.5–15.5)
WBC: 8.4 10*3/uL (ref 4.0–10.5)

## 2017-05-03 LAB — PROTIME-INR
INR: 1.57
PROTHROMBIN TIME: 18.6 s — AB (ref 11.4–15.2)

## 2017-05-03 LAB — HEPARIN LEVEL (UNFRACTIONATED): Heparin Unfractionated: 0.43 IU/mL (ref 0.30–0.70)

## 2017-05-03 LAB — MAGNESIUM: Magnesium: 1.7 mg/dL (ref 1.7–2.4)

## 2017-05-03 MED ORDER — POTASSIUM CHLORIDE CRYS ER 20 MEQ PO TBCR
20.0000 meq | EXTENDED_RELEASE_TABLET | Freq: Once | ORAL | Status: AC
  Administered 2017-05-03: 20 meq via ORAL
  Filled 2017-05-03: qty 1

## 2017-05-03 MED ORDER — WARFARIN SODIUM 7.5 MG PO TABS
7.5000 mg | ORAL_TABLET | Freq: Once | ORAL | Status: AC
  Administered 2017-05-03: 7.5 mg via ORAL
  Filled 2017-05-03: qty 1

## 2017-05-03 NOTE — Progress Notes (Signed)
Progress Note  Patient Name: [name] Date of Encounter: 05/03/2017  Primary Cardiologist: Jens Som  Subjective   Feels better, more optimistic. 4 lb lighter today, first time under 180 lb. Net -2L since hospital admission. Flurry of PVCs earlier today, asymptomatic. Electrolytes OK, creat still normal. INR 1.57  Inpatient Medications    Scheduled Meds: . aspirin EC  81 mg Oral Daily  . atorvastatin  80 mg Oral q1800  . docusate sodium  100 mg Oral Daily  . furosemide  80 mg Intravenous Q12H  . insulin aspart  0-5 Units Subcutaneous QHS  . insulin aspart  0-9 Units Subcutaneous TID WC  . losartan  50 mg Oral Daily  . mouth rinse  15 mL Mouth Rinse BID  . mupirocin cream   Topical Daily  . pantoprazole  40 mg Oral Daily  . potassium chloride  20 mEq Oral BID  . protein supplement shake  11 oz Oral BID BM  . spironolactone  50 mg Oral Daily  . warfarin  7.5 mg Oral ONCE-1800  . warfarin   Does not apply Once  . Warfarin - Pharmacist Dosing Inpatient   Does not apply q1800   Continuous Infusions: . sodium chloride Stopped (04/28/17 0053)  . heparin 1,250 Units/hr (05/03/17 0704)  . magnesium sulfate 1 - 4 g bolus IVPB     PRN Meds: acetaminophen, guaiFENesin-dextromethorphan, magnesium sulfate 1 - 4 g bolus IVPB, morphine injection, ondansetron (ZOFRAN) IV, phenol   Vital Signs    Vitals:   05/02/17 2008 05/03/17 0644 05/03/17 0944 05/03/17 1222  BP: 109/61 105/73 106/64 (!) 105/50  Pulse: 83 88 80 90  Resp: Temp: 97.6 F (36.4 C) 98.1 F (36.7 C)  98.4 F (36.9 C)  TempSrc: Oral Oral  Oral  SpO2: 100% 98% 100% 100%  Weight:  177 lb 8 oz (80.5 kg)    Height:        Intake/Output Summary (Last 24 hours) at 05/03/17 1355 Last data filed at 05/03/17 1138  Gross per 24 hour  Intake              480 ml  Output             2276 ml  Net            -1796 ml   Filed Weights   05/02/17 0534 05/02/17 1557 05/03/17 0644  Weight: 191 lb 12.8 oz  (87 kg) 181 lb 1.6 oz (82.1 kg) 177 lb 8 oz (80.5 kg)    Telemetry    NSR with frequent PVCs, no VT - Personally Reviewed  ECG    NSR with frequent PVCs, LVH with QRS widening and repol changes - Personally Reviewed  Physical Exam  Smiling today GEN: No acute distress.   Neck: No JVD Cardiac: RRR, no murmurs, rubs, S3 gallop Respiratory: Clear to auscultation bilaterally. GI: Soft, nontender, non-distended  MS: No edema; No deformity. Neuro:  mild right sided paresis Psych: Normal affect   Labs    Chemistry Recent Labs Lab 05/02/17 0451 05/03/17 0603 05/03/17 1152  NA 136 135 134*  K 3.9 3.8 3.9  CL 102 100* 96*  CO2 GLUCOSE 198* 181* 208*  BUN 21* 20 23*  CREATININE 1.10 1.11 1.15  CALCIUM 9.6 9.5 9.2  GFRNONAA >60 >60 >60  GFRAA >60 >60 >60  ANIONGAP Hematology Recent Labs Lab 05/01/17 [phone] 05/02/17 0451  05/03/17 0603  WBC 8.0 8.4 8.4  RBC 4.32 4.52 4.61  HGB 10.8* 11.3* 11.8*  HCT 30.3* 31.7* 32.4*  MCV 70.1* 70.1* 70.3*  MCH 25.0* 25.0* 25.6*  MCHC 35.6 35.6 36.4*  RDW 14.5 14.5 14.6  PLT 240 252 238    Cardiac EnzymesNo results for input(s): TROPONINI in the last 168 hours. No results for input(s): TROPIPOC in the last 168 hours.   BNPNo results for input(s): BNP, PROBNP in the last 168 hours.   DDimer No results for input(s): DDIMER in the last 168 hours.   Radiology    No results found.  Cardiac Studies   04/18/2017 ECHO Study Conclusions - Left ventricle: The cavity size was moderately dilated. Wall thickness was normal. Systolic function was severely reduced. The estimated ejection fraction was in the range of 20% to 25%. - Aortic valve: There was mild regurgitation. - Mitral valve: There was mild regurgitation.   Patient Profile     71 y.o. male with severe ischemic cardiopathy (EF 20-25%), complicated by acute pulmonary edema, acute hypoxic respiratory failure requiring intubation, hyponatremia,  subsequently with acute ischemic R MCAstroke and acute right SFA-popliteal occlusion, requiring thrombectomy and thrombolytics.  Now 6days following suspected embolic event. Warfarin initiated, still on IV heparin. Gradual improvement in heart failure symptoms with diuretics.  Assessment & Plan    1. Acute on chronic systolic and diastolic heart failure:Continue one more day of IV diuretics. Give one more metolazone today. Tomorrow probably switch to PO diuretics and if BP allows will try to start very low dose carvedilol.  May need CHF service consult if this plan does not work. If he continues to improve, will be ready for transfer to inpatient rehabilitation Monday. Reevaluate left ventricular ejection fraction after optimization of heart failure meds, in about 3 months. May need implantable defibrillator. 2. CAD s/p CABG: He remains free of angina. Nuclear stress test performed just a few days ago showed an extensive inferior and inferolateral scar and EF under 30%. No evidence for acute coronary event 3. Acute embolic right MCA stroke: On heparin, warfarin initiated, INR today 1.58. DC heparin when INR 2.0. Plan to maintain on lifelong anticoagulation. May need to adjust agent depending on availability of prothrombin time monitoring versus availability of thorax in [place]. He will probably be here for a few months at least. 4. R popliteal acute embolic occlusion: Seen today by Dr. Imogene Burn, good progress with healing of thrombectomy site. 5. Hyponatremia:Resolved with diuresis, was a sign of severe heart failure. 6. KGM:WNUUVO LDL <70, was 93 recently. Atorvastatin 80 mg daily was just started. Recheck lipids in 3 months 7. ZD:GUYQIH controlled, most recent hemoglobin A1c 9.4%. Recently started on Lantus and given a glucometer.Keep close eye on the toe ulcer because of the diabetes.  For questions or updates, please contact [facility] Please consult [email or web address] for contact info under  Cardiology/STEMI.      Signed, Thurmon Fair, MD  05/03/2017, 1:55 PM

## 2017-05-03 NOTE — Plan of Care (Signed)
Problem: Education: Goal: Ability to demonstrate managment of disease process will improve Outcome: Progressing With assistance from daughter and son in law   Problem: Coping: Goal: Ability to verbalize positive feelings about self will improve Outcome: Progressing Very pleased with his progress walking

## 2017-05-03 NOTE — Progress Notes (Signed)
Pt noted to have brief burst of increased ectopy (PVCs/NSVT) and bigeminy. Per d/w nurse, pt asymptomatic, sitting on site of bed smiling. VSS. K 3.8 this AM, receiving BID - also got  IV Lasix this AM. Anticipate further potassium losses so will give additional x 1 now. Add f/u Mg level from this AM and also repeat lytes now. EKG pending. Pt will be seen in rounds by MD as well this AM.  Ronie Spies PA-C

## 2017-05-03 NOTE — Progress Notes (Signed)
ANTICOAGULATION CONSULT NOTE - Follow up Consult  Pharmacy Consult for Heparin and Warfarin Indication: stroke and SFA thrombus  No Known Allergies  Patient Measurements: Height: 6' (182.9 cm) Weight: 177 lb 8 oz (80.5 kg) IBW/kg (Calculated) : 77.6 Heparin Dosing Weight: 87.7kg  Vital Signs: Temp: 98.1 F (36.7 C) (09/15 0644) Temp Source: Oral (09/15 0644) BP: 106/64 (09/15 0944) Pulse Rate: 80 (09/15 0944)  Labs:  Recent Labs  05/01/17 0635 05/02/17 0451 05/03/17 0603 05/03/17 0604  HGB 10.8* 11.3* 11.8*  --   HCT 30.3* 31.7* 32.4*  --   PLT 240 252 238  --   LABPROT 18.0* 18.7* 18.6*  --   INR 1.50 1.58 1.57  --   HEPARINUNFRC 0.50 0.51  --  0.43  CREATININE 1.03 1.10 1.11  --     Estimated Creatinine Clearance: 68 mL/min (by C-G formula based on SCr of 1.11 mg/dL).  Assessment: 88 yom presented with an acute stroke and subsequent SFA thrombus. Received tPA 9/7 AM and now transitioned to IV heparin. MRI on 9/10 showing without hemorrhagic conversion. No anticoagulation prior to admission.   On heparin for SFA thrombus + CVA s/p R leg thrombectomy. Coumadin started on 9/10. Heparin level remains within therapeutic range at  0.43. H/H and platelets remain stable. INR today changed from 1.58 to 1.57. No new medication interactions. No signs/symptoms of bleeding noted in chart.  Goal of Therapy:  INR 2-3 Heparin level 0.3-0.5 units/ml Monitor platelets by anticoagulation protocol: Yes   Plan:  Continue heparin gtt at 1,250 units/hr Order Coumadin 7.5 mg PO x 1 Monitor daily heparin level / INR, CBC, s/s of bleed  Girard Cooter, PharmD Clinical Pharmacist  Phone: [phone] 05/03/2017 10:36 AM

## 2017-05-03 NOTE — Progress Notes (Signed)
Patient stable during 7 a to 7 p shift, only real complaint continues to be cough for which he is receiving Robitussin.  Ambulated in hall as per previous note, very pleased with his progress.  Daughter and son in law remain at bedside at all times.

## 2017-05-03 NOTE — Progress Notes (Signed)
Patient ambulated from room to nurses station and back with walker and standby assist.  Patient had to take 2 rest breaks as he felt tired, denies chest pain or shortness of breath.  Daughter had reported left leg "dragging" when patient walked but this was not noticed by RN today.

## 2017-05-04 LAB — CBC
HCT: 32.9 % — ABNORMAL LOW (ref 39.0–52.0)
HEMOGLOBIN: 11.9 g/dL — AB (ref 13.0–17.0)
MCH: 25.3 pg — AB (ref 26.0–34.0)
MCHC: 36.2 g/dL — AB (ref 30.0–36.0)
MCV: 70 fL — AB (ref 78.0–100.0)
Platelets: 265 10*3/uL (ref 150–400)
RBC: 4.7 MIL/uL (ref 4.22–5.81)
RDW: 14.6 % (ref 11.5–15.5)
WBC: 8.3 10*3/uL (ref 4.0–10.5)

## 2017-05-04 LAB — BASIC METABOLIC PANEL
ANION GAP: 10 (ref 5–15)
BUN: 27 mg/dL — ABNORMAL HIGH (ref 6–20)
CHLORIDE: 99 mmol/L — AB (ref 101–111)
CO2: 26 mmol/L (ref 22–32)
Calcium: 9.6 mg/dL (ref 8.9–10.3)
Creatinine, Ser: 1.25 mg/dL — ABNORMAL HIGH (ref 0.61–1.24)
GFR calc non Af Amer: 57 mL/min — ABNORMAL LOW (ref >60)
Glucose, Bld: 167 mg/dL — ABNORMAL HIGH (ref 65–99)
POTASSIUM: 3.8 mmol/L (ref 3.5–5.1)
SODIUM: 135 mmol/L (ref 135–145)

## 2017-05-04 LAB — GLUCOSE, CAPILLARY
GLUCOSE-CAPILLARY: 156 mg/dL — AB (ref 65–99)
GLUCOSE-CAPILLARY: 165 mg/dL — AB (ref 65–99)
GLUCOSE-CAPILLARY: 247 mg/dL — AB (ref 65–99)
Glucose-Capillary: 218 mg/dL — ABNORMAL HIGH (ref 65–99)

## 2017-05-04 LAB — PROTIME-INR
INR: 1.74
Prothrombin Time: 20.2 seconds — ABNORMAL HIGH (ref 11.4–15.2)

## 2017-05-04 LAB — HEPARIN LEVEL (UNFRACTIONATED): Heparin Unfractionated: 0.56 IU/mL (ref 0.30–0.70)

## 2017-05-04 MED ORDER — WARFARIN SODIUM 5 MG PO TABS
5.0000 mg | ORAL_TABLET | Freq: Once | ORAL | Status: AC
  Administered 2017-05-04: 5 mg via ORAL
  Filled 2017-05-04: qty 1

## 2017-05-04 MED ORDER — FUROSEMIDE 80 MG PO TABS
80.0000 mg | ORAL_TABLET | Freq: Every day | ORAL | Status: DC
  Administered 2017-05-05: 80 mg via ORAL
  Filled 2017-05-04: qty 1

## 2017-05-04 NOTE — Plan of Care (Signed)
Problem: Education: Goal: Ability to verbalize understanding of medication therapies will improve Outcome: Progressing Family knowledgeable regarding patients medications   Problem: Coping: Goal: Ability to verbalize positive feelings about self will improve Outcome: Progressing Patient upbeat about his progress and being able to walk

## 2017-05-04 NOTE — Progress Notes (Addendum)
ANTICOAGULATION CONSULT NOTE - Follow up Consult  Pharmacy Consult for Heparin and Warfarin Indication: stroke and SFA thrombus  No Known Allergies  Patient Measurements: Height: 6' (182.9 cm) Weight: 174 lb 8 oz (79.2 kg) IBW/kg (Calculated) : 77.6 Heparin Dosing Weight: 87.7kg  Vital Signs: Temp: 97.9 F (36.6 C) (09/16 0500) Temp Source: Oral (09/16 0500) BP: 113/65 (09/16 0500) Pulse Rate: 82 (09/16 0500)  Labs:  Recent Labs  05/02/17 0451 05/03/17 0603 05/03/17 0604 05/03/17 1152 05/04/17 0413  HGB 11.3* 11.8*  --   --  11.9*  HCT 31.7* 32.4*  --   --  32.9*  PLT 252 238  --   --  265  LABPROT 18.7* 18.6*  --   --  20.2*  INR 1.58 1.57  --   --  1.74  HEPARINUNFRC 0.51  --  0.43  --  0.56  CREATININE 1.10 1.11  --  1.15 1.25*    Estimated Creatinine Clearance: 60.4 mL/min (A) (by C-G formula based on SCr of 1.25 mg/dL (H)).  Assessment: 88 yom presented with an acute stroke and subsequent SFA thrombus. Received tPA 9/7 AM and now transitioned to IV heparin. MRI on 9/10 showing without hemorrhagic conversion. No anticoagulation prior to admission.   On heparin for SFA thrombus + CVA s/p R leg thrombectomy. Coumadin started on 9/10. Heparin level increased slightly above goal range to 0.56 running at 1250 units/hr. H/H and platelets remain stable. INR today changed from 1.57 to 1.74 following one time dose of 7.5 mg yesterday. No new medication interactions. No signs/symptoms of bleeding noted in chart.  Goal of Therapy:  INR 2-3 Heparin level 0.3-0.5 units/ml Monitor platelets by anticoagulation protocol: Yes   Plan:  Decrease heparin gtt to 1,200 units/hr Order Coumadin 5 mg PO x 1 Monitor daily heparin level / INR, CBC, s/s of bleed  Girard Cooter, PharmD Clinical Pharmacist  Phone: [phone] 05/04/2017 10:31 AM

## 2017-05-04 NOTE — Progress Notes (Signed)
Patient stable during 7 a to 7 p shift, relates his shortness of breath has resolved, continues with dry cough for which he is receiving Robitussin.  Patient delighted that he can get up and walk with walker.  Patient and family anticipate moving to inpatient rehab soon.

## 2017-05-04 NOTE — Plan of Care (Signed)
Problem: Nutrition: Goal: Risk of aspiration will decrease Outcome: Not Progressing Pt is not eating evening meal. Offered protein drink

## 2017-05-04 NOTE — Progress Notes (Signed)
Progress Note  Patient Name: Noah Lembke Date of Encounter: 05/04/2017  Primary Cardiologist: Dr. Velta Addison   Subjective   No complaints  Inpatient Medications    Scheduled Meds: . aspirin EC  81 mg Oral Daily  . atorvastatin  80 mg Oral q1800  . docusate sodium  100 mg Oral Daily  . furosemide  80 mg Intravenous Q12H  . insulin aspart  0-5 Units Subcutaneous QHS  . insulin aspart  0-9 Units Subcutaneous TID WC  . losartan  50 mg Oral Daily  . mouth rinse  15 mL Mouth Rinse BID  . mupirocin cream   Topical Daily  . pantoprazole  40 mg Oral Daily  . potassium chloride  20 mEq Oral BID  . protein supplement shake  11 oz Oral BID BM  . spironolactone  50 mg Oral Daily  . warfarin  5 mg Oral ONCE-1800  . warfarin   Does not apply Once  . Warfarin - Pharmacist Dosing Inpatient   Does not apply q1800   Continuous Infusions: . sodium chloride Stopped (04/28/17 0053)  . heparin 1,250 Units/hr (05/04/17 0155)  . magnesium sulfate 1 - 4 g bolus IVPB     PRN Meds: acetaminophen, guaiFENesin-dextromethorphan, magnesium sulfate 1 - 4 g bolus IVPB, morphine injection, ondansetron (ZOFRAN) IV, phenol   Vital Signs    Vitals:   05/03/17 0944 05/03/17 1222 05/03/17 2032 05/04/17 0500  BP: 106/64 (!) 105/50 107/63 113/65  Pulse: 80 90 94 82  Resp:  Temp:  98.4 F (36.9 C) 98.2 F (36.8 C) 97.9 F (36.6 C)  TempSrc:  Oral Oral Oral  SpO2: 100% 100% 98% 98%  Weight:    174 lb 8 oz (79.2 kg)  Height:        Intake/Output Summary (Last 24 hours) at 05/04/17 1051 Last data filed at 05/04/17 1023  Gross per 24 hour  Intake           907.92 ml  Output             1701 ml  Net          -793.08 ml   Filed Weights   05/02/17 1557 05/03/17 0644 05/04/17 0500  Weight: 181 lb 1.6 oz (82.1 kg) 177 lb 8 oz (80.5 kg) 174 lb 8 oz (79.2 kg)    Telemetry    SR with PVCs occ to freq - Personally Reviewed  ECG    No new - Personally Reviewed  Physical Exam    GEN: No acute distress.   Neck: No JVD sitting up in chair Cardiac: RRR, with premature beats no murmurs, rubs, or gallops.  Respiratory: Clear to auscultation bilaterally. GI: Soft, nontender, non-distended  MS: No edema; No deformity. Neuro:  Nonfocal  Psych: Normal affect   Labs    Chemistry Recent Labs Lab 05/03/17 0603 05/03/17 1152 05/04/17 0413  NA 135 134* 135  K 3.8 3.9 3.8  CL 100* 96* 99*  CO2 GLUCOSE 181* 208* 167*  BUN 20 23* 27*  CREATININE 1.11 1.15 1.25*  CALCIUM 9.5 9.2 9.6  GFRNONAA >60 >60 57*  GFRAA >60 >60 >60  ANIONGAP Hematology Recent Labs Lab 05/02/17 0451 05/03/17 0603 05/04/17 0413  WBC 8.4 8.4 8.3  RBC 4.52 4.61 4.70  HGB 11.3* 11.8* 11.9*  HCT 31.7* 32.4* 32.9*  MCV 70.1* 70.3* 70.0*  MCH 25.0* 25.6* 25.3*  MCHC 35.6  36.4* 36.2*  RDW 14.5 14.6 14.6  PLT 252 238 265    Cardiac EnzymesNo results for input(s): TROPONINI in the last 168 hours. No results for input(s): TROPIPOC in the last 168 hours.   BNPNo results for input(s): BNP, PROBNP in the last 168 hours.   DDimer No results for input(s): DDIMER in the last 168 hours.   Radiology    No results found.  Cardiac Studies    04/18/2017 ECHO Study Conclusions - Left ventricle: The cavity size was moderately dilated. Wall thickness was normal. Systolic function was severely reduced. The estimated ejection fraction was in the range of 20% to 25%. - Aortic valve: There was mild regurgitation. - Mitral valve: There was mild regurgitation.   Patient Profile     71 y.o. male with severe ischemic cardiopathy (EF 20-25%), complicated by acute pulmonary edema, acute hypoxic respiratory failure requiring intubation, hyponatremia, subsequently with acute ischemic R MCAstroke and acute right SFA-popliteal occlusion, requiring thrombectomy and thrombolytics.  Now 6days following suspected embolic event. Warfarin initiated, still on IV heparin. Gradual  improvement in heart failure symptoms with diuretics.   Assessment & Plan    1. Acute on chronic systolic and diastolic heart failure:Continue one more day of IV diuretics. Give one more metolazone today. Tomorrow probably switch to PO diuretics and if BP allows will try to start very low dose carvedilol.  May need CHF service consult if this plan does not work. If he continues to improve, will be ready for transfer to inpatient rehabilitation Monday. Reevaluate left ventricular ejection fraction after optimization of heart failure meds, in about 3 months. May need implantable defibrillator. --wt 174.8 down from 204 at pk. (30 lbs) (175 lbs new dry wt.) change to po lasix today  --Cr 1.25 today up from 1.15, 1.11  2. CAD s/p CABG: He remains free of angina. Nuclear stress test performed just a few days ago showed an extensive inferior and inferolateral scar and EF under 30%. No evidence for acute coronary event  3. Acute embolic right MCA stroke: On heparin, warfarin initiated, INR today 1.75. DC heparin when INR 2.0. Plan to maintain on lifelong anticoagulation. May need to adjust agent depending on availability of prothrombin time monitoring versus availability of thorax in [place]. He will probably be here for a few months at least.  4. R popliteal acute embolic occlusion: Seen today by Dr. Imogene Burn, good progress with healing of thrombectomy site.  5. Hyponatremia:Resolved with diuresis, was a sign of severe heart failure.  6. WJX:BJYNWG LDL <70, was 93 recently. Atorvastatin 80 mg daily was just started. Recheck lipids in 3 months  7. NF:AOZHYQ controlled, most recent hemoglobin A1c 9.4%. Recently started on Lantus and given a glucometer.Keep close eye on the toe ulcer because of the diabetes.  For questions or updates, please contact [facility] Please consult [email or web address] for contact info under Cardiology/STEMI.      Signed, Nada Boozer, NP  05/04/2017, 10:51 AM    I have  seen and examined the patient along with Nada Boozer, NP , PA NP.  I have reviewed the chart, notes and new data.  I agree with PA/NP's note.  Key new complaints: The first times that very well last night lying fully flat in bed. Was able to walk without assistance today. His right leg (where he had the embolism) is very sturdy, but his left leg drags a little bit (from the stroke). Key examination changes: Clear lungs, no jugular venous distention, S3 is only  heard intermittently, no murmur, no edema. Down 275 pounds Key new findings / data: Creatinine has started to creep up with diuresis  PLAN: I think we have finally reached optimal fully mixed status. Switch to oral diuretics. Will try furosemide 80 mg by mouth once daily, increase 220 mg daily if weight exceeds 177 pounds. Needs early follow-up in 1-2 weeks if discharged home. Discussed the daily weight monitoring, signs and symptoms of heart failure exacerbation, dietary sodium restriction. He will be reevaluated by inpatient rehabilitation tomorrow to see if he still needs rehabilitation versus discharge to home. Needs to remain on intravenous heparin until INR 2.0 or greater.  Thurmon Fair, MD, [facility] [phone] 05/04/2017, 12:31 PM

## 2017-05-05 ENCOUNTER — Encounter (HOSPITAL_COMMUNITY): Payer: Self-pay | Admitting: *Deleted

## 2017-05-05 ENCOUNTER — Inpatient Hospital Stay (HOSPITAL_COMMUNITY)
Admission: RE | Admit: 2017-05-05 | Discharge: 2017-05-12 | DRG: 057 | Disposition: A | Discharge status: Home / Self Care | Payer: Self-pay | Source: Intra-hospital | Attending: Physical Medicine & Rehabilitation | Admitting: Physical Medicine & Rehabilitation

## 2017-05-05 DIAGNOSIS — E785 Hyperlipidemia, unspecified: Secondary | ICD-10-CM | POA: Diagnosis present

## 2017-05-05 DIAGNOSIS — I110 Hypertensive heart disease with heart failure: Secondary | ICD-10-CM | POA: Diagnosis present

## 2017-05-05 DIAGNOSIS — I639 Cerebral infarction, unspecified: Secondary | ICD-10-CM

## 2017-05-05 DIAGNOSIS — R0602 Shortness of breath: Secondary | ICD-10-CM

## 2017-05-05 DIAGNOSIS — E1142 Type 2 diabetes mellitus with diabetic polyneuropathy: Secondary | ICD-10-CM

## 2017-05-05 DIAGNOSIS — Z5181 Encounter for therapeutic drug level monitoring: Secondary | ICD-10-CM

## 2017-05-05 DIAGNOSIS — D62 Acute posthemorrhagic anemia: Secondary | ICD-10-CM

## 2017-05-05 DIAGNOSIS — I25810 Atherosclerosis of coronary artery bypass graft(s) without angina pectoris: Secondary | ICD-10-CM

## 2017-05-05 DIAGNOSIS — M792 Neuralgia and neuritis, unspecified: Secondary | ICD-10-CM

## 2017-05-05 DIAGNOSIS — E119 Type 2 diabetes mellitus without complications: Secondary | ICD-10-CM

## 2017-05-05 DIAGNOSIS — I2510 Atherosclerotic heart disease of native coronary artery without angina pectoris: Secondary | ICD-10-CM | POA: Diagnosis present

## 2017-05-05 DIAGNOSIS — Z7901 Long term (current) use of anticoagulants: Secondary | ICD-10-CM

## 2017-05-05 DIAGNOSIS — Z951 Presence of aortocoronary bypass graft: Secondary | ICD-10-CM

## 2017-05-05 DIAGNOSIS — E1151 Type 2 diabetes mellitus with diabetic peripheral angiopathy without gangrene: Secondary | ICD-10-CM | POA: Diagnosis present

## 2017-05-05 DIAGNOSIS — I69354 Hemiplegia and hemiparesis following cerebral infarction affecting left non-dominant side: Principal | ICD-10-CM

## 2017-05-05 DIAGNOSIS — D638 Anemia in other chronic diseases classified elsewhere: Secondary | ICD-10-CM

## 2017-05-05 DIAGNOSIS — I255 Ischemic cardiomyopathy: Secondary | ICD-10-CM

## 2017-05-05 DIAGNOSIS — I63511 Cerebral infarction due to unspecified occlusion or stenosis of right middle cerebral artery: Secondary | ICD-10-CM | POA: Diagnosis present

## 2017-05-05 DIAGNOSIS — N179 Acute kidney failure, unspecified: Secondary | ICD-10-CM | POA: Diagnosis present

## 2017-05-05 DIAGNOSIS — I5022 Chronic systolic (congestive) heart failure: Secondary | ICD-10-CM | POA: Diagnosis present

## 2017-05-05 DIAGNOSIS — Z9119 Patient's noncompliance with other medical treatment and regimen: Secondary | ICD-10-CM

## 2017-05-05 DIAGNOSIS — Z9114 Patient's other noncompliance with medication regimen: Secondary | ICD-10-CM

## 2017-05-05 DIAGNOSIS — E871 Hypo-osmolality and hyponatremia: Secondary | ICD-10-CM | POA: Diagnosis present

## 2017-05-05 DIAGNOSIS — I10 Essential (primary) hypertension: Secondary | ICD-10-CM

## 2017-05-05 LAB — CBC WITH DIFFERENTIAL/PLATELET
BASOS ABS: 0 10*3/uL (ref 0.0–0.1)
Basophils Relative: 0 %
EOS PCT: 3 %
Eosinophils Absolute: 0.2 10*3/uL (ref 0.0–0.7)
HEMATOCRIT: 34.4 % — AB (ref 39.0–52.0)
HEMOGLOBIN: 12.5 g/dL — AB (ref 13.0–17.0)
LYMPHS ABS: 3.5 10*3/uL (ref 0.7–4.0)
LYMPHS PCT: 47 %
MCH: 25.6 pg — ABNORMAL LOW (ref 26.0–34.0)
MCHC: 36.3 g/dL — AB (ref 30.0–36.0)
MCV: 70.3 fL — AB (ref 78.0–100.0)
MONOS PCT: 10 %
Monocytes Absolute: 0.7 10*3/uL (ref 0.1–1.0)
NEUTROS ABS: 3 10*3/uL (ref 1.7–7.7)
Neutrophils Relative %: 40 %
Platelets: 274 10*3/uL (ref 150–400)
RBC: 4.89 MIL/uL (ref 4.22–5.81)
RDW: 14.7 % (ref 11.5–15.5)
Smear Review: ADEQUATE
WBC: 7.4 10*3/uL (ref 4.0–10.5)

## 2017-05-05 LAB — BASIC METABOLIC PANEL
ANION GAP: 11 (ref 5–15)
BUN: 35 mg/dL — ABNORMAL HIGH (ref 6–20)
CALCIUM: 9.5 mg/dL (ref 8.9–10.3)
CHLORIDE: 97 mmol/L — AB (ref 101–111)
CO2: 25 mmol/L (ref 22–32)
Creatinine, Ser: 1.37 mg/dL — ABNORMAL HIGH (ref 0.61–1.24)
GFR calc Af Amer: 59 mL/min — ABNORMAL LOW (ref >60)
GFR calc non Af Amer: 51 mL/min — ABNORMAL LOW (ref >60)
GLUCOSE: 169 mg/dL — AB (ref 65–99)
Potassium: 4.2 mmol/L (ref 3.5–5.1)
Sodium: 133 mmol/L — ABNORMAL LOW (ref 135–145)

## 2017-05-05 LAB — GLUCOSE, CAPILLARY
GLUCOSE-CAPILLARY: 186 mg/dL — AB (ref 65–99)
GLUCOSE-CAPILLARY: 228 mg/dL — AB (ref 65–99)
Glucose-Capillary: 221 mg/dL — ABNORMAL HIGH (ref 65–99)

## 2017-05-05 LAB — COMPREHENSIVE METABOLIC PANEL
ALBUMIN: 3.8 g/dL (ref 3.5–5.0)
ALK PHOS: 98 U/L (ref 38–126)
ALT: 27 U/L (ref 17–63)
AST: 25 U/L (ref 15–41)
Anion gap: 9 (ref 5–15)
BILIRUBIN TOTAL: 0.8 mg/dL (ref 0.3–1.2)
BUN: 32 mg/dL — AB (ref 6–20)
CALCIUM: 9.7 mg/dL (ref 8.9–10.3)
CO2: 26 mmol/L (ref 22–32)
CREATININE: 1.39 mg/dL — AB (ref 0.61–1.24)
Chloride: 97 mmol/L — ABNORMAL LOW (ref 101–111)
GFR calc Af Amer: 58 mL/min — ABNORMAL LOW (ref >60)
GFR, EST NON AFRICAN AMERICAN: 50 mL/min — AB (ref >60)
GLUCOSE: 179 mg/dL — AB (ref 65–99)
POTASSIUM: 4.3 mmol/L (ref 3.5–5.1)
Sodium: 132 mmol/L — ABNORMAL LOW (ref 135–145)
TOTAL PROTEIN: 7.2 g/dL (ref 6.5–8.1)

## 2017-05-05 LAB — CBC
HEMATOCRIT: 33.3 % — AB (ref 39.0–52.0)
Hemoglobin: 12 g/dL — ABNORMAL LOW (ref 13.0–17.0)
MCH: 25.2 pg — AB (ref 26.0–34.0)
MCHC: 36 g/dL (ref 30.0–36.0)
MCV: 70 fL — AB (ref 78.0–100.0)
PLATELETS: 282 10*3/uL (ref 150–400)
RBC: 4.76 MIL/uL (ref 4.22–5.81)
RDW: 14.5 % (ref 11.5–15.5)
WBC: 8.6 10*3/uL (ref 4.0–10.5)

## 2017-05-05 LAB — PROTIME-INR
INR: 1.91
Prothrombin Time: 21.7 seconds — ABNORMAL HIGH (ref 11.4–15.2)

## 2017-05-05 LAB — HEPARIN LEVEL (UNFRACTIONATED): Heparin Unfractionated: 0.53 IU/mL (ref 0.30–0.70)

## 2017-05-05 MED ORDER — ATORVASTATIN CALCIUM 80 MG PO TABS
80.0000 mg | ORAL_TABLET | Freq: Every day | ORAL | Status: DC
  Administered 2017-05-06 – 2017-05-11 (×6): 80 mg via ORAL
  Filled 2017-05-05 (×6): qty 1

## 2017-05-05 MED ORDER — POTASSIUM CHLORIDE CRYS ER 20 MEQ PO TBCR: 20.0000 meq | EXTENDED_RELEASE_TABLET | Freq: Two times a day (BID) | ORAL | 5 refills | Status: DC

## 2017-05-05 MED ORDER — SPIRONOLACTONE 50 MG PO TABS: 50.0000 mg | ORAL_TABLET | Freq: Every day | ORAL | 5 refills | Status: DC

## 2017-05-05 MED ORDER — ONDANSETRON HCL 4 MG/2ML IJ SOLN: 4.0000 mg | Freq: Four times a day (QID) | INTRAMUSCULAR | Status: DC | PRN

## 2017-05-05 MED ORDER — LOSARTAN POTASSIUM 50 MG PO TABS: 50.0000 mg | ORAL_TABLET | Freq: Every day | ORAL | 5 refills | Status: DC

## 2017-05-05 MED ORDER — INSULIN ASPART 100 UNIT/ML ~~LOC~~ SOLN: 0.0000 [IU] | Freq: Three times a day (TID) | SUBCUTANEOUS | 11 refills | Status: DC

## 2017-05-05 MED ORDER — HEPARIN (PORCINE) IN NACL 100-0.45 UNIT/ML-% IJ SOLN
1000.0000 [IU]/h | INTRAMUSCULAR | Status: DC
  Administered 2017-05-05: 1150 [IU]/h via INTRAVENOUS
  Administered 2017-05-06: 1050 [IU]/h via INTRAVENOUS
  Administered 2017-05-07 – 2017-05-08 (×3): 1000 [IU]/h via INTRAVENOUS
  Filled 2017-05-05 (×3): qty 250

## 2017-05-05 MED ORDER — DOCUSATE SODIUM 100 MG PO CAPS
100.0000 mg | ORAL_CAPSULE | Freq: Every day | ORAL | Status: DC
  Administered 2017-05-06 – 2017-05-11 (×6): 100 mg via ORAL
  Filled 2017-05-05 (×6): qty 1

## 2017-05-05 MED ORDER — HEPARIN (PORCINE) IN NACL 100-0.45 UNIT/ML-% IJ SOLN: INTRAMUSCULAR | 0 refills | Status: DC

## 2017-05-05 MED ORDER — HEPARIN (PORCINE) IN NACL 100-0.45 UNIT/ML-% IJ SOLN: 1150.0000 [IU]/h | INTRAMUSCULAR | Status: DC

## 2017-05-05 MED ORDER — WARFARIN SODIUM 3 MG PO TABS
6.0000 mg | ORAL_TABLET | Freq: Once | ORAL | Status: DC
  Administered 2017-05-05: 6 mg via ORAL
  Filled 2017-05-05: qty 2

## 2017-05-05 MED ORDER — FUROSEMIDE 80 MG PO TABS: 80.0000 mg | ORAL_TABLET | Freq: Every day | ORAL | 5 refills | Status: DC

## 2017-05-05 MED ORDER — SPIRONOLACTONE 25 MG PO TABS
50.0000 mg | ORAL_TABLET | Freq: Every day | ORAL | Status: DC
  Administered 2017-05-06 – 2017-05-12 (×7): 50 mg via ORAL
  Filled 2017-05-05 (×7): qty 2

## 2017-05-05 MED ORDER — PANTOPRAZOLE SODIUM 40 MG PO TBEC: 40.0000 mg | DELAYED_RELEASE_TABLET | Freq: Every day | ORAL | 5 refills | Status: DC

## 2017-05-05 MED ORDER — WARFARIN SODIUM 6 MG PO TABS: 6.0000 mg | ORAL_TABLET | Freq: Once | ORAL | Status: DC

## 2017-05-05 MED ORDER — WARFARIN SODIUM 6 MG PO TABS: 6.0000 mg | ORAL_TABLET | Freq: Once | ORAL | 0 refills | Status: DC

## 2017-05-05 MED ORDER — ACETAMINOPHEN 325 MG PO TABS: 650.0000 mg | ORAL_TABLET | ORAL | Status: AC | PRN

## 2017-05-05 MED ORDER — POTASSIUM CHLORIDE CRYS ER 20 MEQ PO TBCR
20.0000 meq | EXTENDED_RELEASE_TABLET | Freq: Two times a day (BID) | ORAL | Status: DC
  Administered 2017-05-05 – 2017-05-12 (×14): 20 meq via ORAL
  Filled 2017-05-05 (×14): qty 1

## 2017-05-05 MED ORDER — INSULIN ASPART 100 UNIT/ML ~~LOC~~ SOLN
0.0000 [IU] | Freq: Every day | SUBCUTANEOUS | Status: DC
  Administered 2017-05-07: 2 [IU] via SUBCUTANEOUS
  Administered 2017-05-08: 3 [IU] via SUBCUTANEOUS
  Administered 2017-05-09: 4 [IU] via SUBCUTANEOUS

## 2017-05-05 MED ORDER — PANTOPRAZOLE SODIUM 40 MG PO TBEC
40.0000 mg | DELAYED_RELEASE_TABLET | Freq: Every day | ORAL | Status: DC
  Administered 2017-05-06 – 2017-05-12 (×7): 40 mg via ORAL
  Filled 2017-05-05 (×7): qty 1

## 2017-05-05 MED ORDER — ASPIRIN EC 81 MG PO TBEC
81.0000 mg | DELAYED_RELEASE_TABLET | Freq: Every day | ORAL | Status: DC
Start: 2017-05-06 — End: 2017-05-11
  Administered 2017-05-06 – 2017-05-11 (×6): 81 mg via ORAL
  Filled 2017-05-05 (×6): qty 1

## 2017-05-05 MED ORDER — PREMIER PROTEIN SHAKE
11.0000 [oz_av] | Freq: Two times a day (BID) | ORAL | Status: DC
  Administered 2017-05-06 – 2017-05-11 (×9): 11 [oz_av] via ORAL
  Filled 2017-05-05 (×26): qty 325.31

## 2017-05-05 MED ORDER — INSULIN ASPART 100 UNIT/ML ~~LOC~~ SOLN: 0.0000 [IU] | Freq: Every day | SUBCUTANEOUS | 11 refills | Status: DC

## 2017-05-05 MED ORDER — MUPIROCIN CALCIUM 2 % EX CREA
TOPICAL_CREAM | Freq: Every day | CUTANEOUS | Status: DC
  Administered 2017-05-06: 08:00:00 via TOPICAL
  Filled 2017-05-05: qty 15

## 2017-05-05 MED ORDER — LOSARTAN POTASSIUM 50 MG PO TABS
50.0000 mg | ORAL_TABLET | Freq: Every day | ORAL | Status: DC
  Administered 2017-05-06 – 2017-05-08 (×3): 50 mg via ORAL
  Filled 2017-05-05 (×3): qty 1

## 2017-05-05 MED ORDER — ONDANSETRON HCL 4 MG PO TABS: 4.0000 mg | ORAL_TABLET | Freq: Four times a day (QID) | ORAL | Status: DC | PRN

## 2017-05-05 MED ORDER — WARFARIN SODIUM 5 MG PO TABS: 5.0000 mg | ORAL_TABLET | Freq: Once | ORAL | Status: DC

## 2017-05-05 MED ORDER — GUAIFENESIN-DM 100-10 MG/5ML PO SYRP
15.0000 mL | ORAL_SOLUTION | ORAL | Status: DC | PRN
  Administered 2017-05-05 – 2017-05-11 (×9): 15 mL via ORAL
  Filled 2017-05-05 (×10): qty 15

## 2017-05-05 MED ORDER — ACETAMINOPHEN 325 MG PO TABS
650.0000 mg | ORAL_TABLET | ORAL | Status: DC | PRN
  Administered 2017-05-06 – 2017-05-11 (×7): 650 mg via ORAL
  Filled 2017-05-05 (×7): qty 2

## 2017-05-05 MED ORDER — ASPIRIN 81 MG PO TBEC: 81.0000 mg | DELAYED_RELEASE_TABLET | Freq: Every day | ORAL | Status: AC

## 2017-05-05 MED ORDER — FUROSEMIDE 80 MG PO TABS
80.0000 mg | ORAL_TABLET | Freq: Every day | ORAL | Status: DC
  Administered 2017-05-06 – 2017-05-12 (×7): 80 mg via ORAL
  Filled 2017-05-05 (×7): qty 1

## 2017-05-05 MED ORDER — WARFARIN - PHARMACIST DOSING INPATIENT
Freq: Every day | Status: DC
  Administered 2017-05-07 – 2017-05-10 (×4)

## 2017-05-05 MED ORDER — SORBITOL 70 % SOLN
30.0000 mL | Freq: Every day | Status: DC | PRN
  Administered 2017-05-11: 30 mL via ORAL
  Filled 2017-05-05: qty 30

## 2017-05-05 NOTE — IPOC Note (Signed)
Overall Plan of Care [facility]) Patient Details Name: [name] MRN: [id number] DOB: May 11, 1946  Admitting Diagnosis: right insular infarction after right lower extremity thromboembolectomy/endarterectomy for ischemic occlusion  Hospital Problems: Active Problems:   Right middle cerebral artery stroke ([name])   AKI (acute kidney injury) ([name])     Functional Problem List: Nursing Endurance, Medication Management, Safety, Skin Integrity  PT Balance, Endurance, Motor  OT Balance, Cognition, Endurance, Motor, Safety, Vision  SLP    TR         Basic ADL's: OT Grooming, Bathing, Dressing, Toileting     Advanced  ADL's: OT       Transfers: PT Bed Mobility, Bed to Chair, Car, [employer], Civil Service fast streamer, Research scientist (life sciences): PT Ambulation     Additional Impairments: OT Fuctional Use of Upper Extremity  SLP        TR      Anticipated Outcomes Item Anticipated Outcome  Self Feeding Supervision  Swallowing      Basic self-care  Mod I dressing, Supervision bathing  Toileting  Mod I   Bathroom Transfers Mod I toilet, Supervision shower  Bowel/Bladder  Supervision  Transfers  mod I   Locomotion  ambulatory at mod I level household distances and supervision community distances  Communication     Cognition     Pain  Call RN when pain meds needed  Safety/Judgment  Call for assistance with transferring, mod I   Therapy Plan: PT Intensity: Minimum of 1-2 x/day ,45 to 90 minutes PT Frequency: 5 out of 7 days PT Duration Estimated Length of Stay: 7-10 days OT Intensity: Minimum of 1-2 x/day, 45 to 90 minutes OT Frequency: 5 out of 7 days OT Duration/Estimated Length of Stay: 7-10 days      Team Interventions: Nursing Interventions Patient/Family Education, Disease Management/Prevention, Pain Management, Medication Management, Skin Care/Wound Management, Discharge Planning, Psychosocial Support  PT interventions Ambulation/gait training, Designer, jewellery, Community reintegration, Discharge planning, Disease management/prevention, DME/adaptive equipment instruction, Functional electrical stimulation, Functional mobility training, Neuromuscular re-education, Pain management, Patient/family education, Psychosocial support, Splinting/orthotics, Stair training, Therapeutic Activities, Therapeutic Exercise, UE/LE Strength taining/ROM, UE/LE Coordination activities  OT Interventions Balance/vestibular training, Cognitive remediation/compensation, Discharge planning, Disease mangement/prevention, DME/adaptive equipment instruction, Functional electrical stimulation, Functional mobility training, Neuromuscular re-education, Pain management, Community reintegration, Patient/family education, Psychosocial support, Self Care/advanced ADL retraining, Therapeutic Activities, Therapeutic Exercise, UE/LE Strength taining/ROM, UE/LE Coordination activities, Visual/perceptual remediation/compensation  SLP Interventions    TR Interventions    SW/CM Interventions Discharge Planning, Psychosocial Support, Patient/Family Education   Barriers to Discharge MD  Medical stability  Nursing Wound Care, Medication compliance    PT      OT      SLP      SW       Team Discharge Planning: Destination: PT-Home ,OT- Home , SLP-  Projected Follow-up: PT-Home health PT, OT-  Outpatient OT, SLP-  Projected Equipment Needs: PT-To be determined, OT- Tub/shower seat, SLP-  Equipment Details: PT-has cane at home, OT-  Patient/family involved in discharge planning: PT- Patient,  OT-Patient, Family member/caregiver, SLP-   MD ELOS: 7-10 days. Medical Rehab Prognosis:  Good Assessment: 71 y.o.right handedlimited English-speaking malewith history of CAD/CABG 2007 in [place], diabetes mellitus, systolic congestive heart failure, hypertension, CVA as well as questionable medical compliance. Presented 04/24/2017 with increasing shortness of breath as well as ischemic right  leg. Patient did require intubation. Chest x-ray cardiac enlargement without failure. CT of the head showed small focus of hypoattenuation  within the right insula compatible with acute infarction. 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 CT angiogram showed occlusion of the right SFA just after the origin. Underwent right femoral-popliteal thromboembolectomy, right below the knee popliteal artery endarterectomy 04/25/2017 per Dr. Imogene Burn. Postoperatively a Foley catheter tube was placed with findings of bladder neck contracture urology consulted underwent cystoscopy for placement of Foley tube. Postoperatively noted left-sided weakness with neurology consulted and MRI showed faint ischemic changes involving the right insula out and basal ganglia. Echocardiogram with ejection fraction of 25% diffuse hypokinesis. No cardiac source of emboli. Acute on chronic anemia. Patient currently maintained on heparin as well as Coumadin therapy. Patient with resulting functional deficits with mobility and self-care. Will set goals for Mod with most tasks with PT/OT.   See Team Conference Notes for weekly updates to the plan of care

## 2017-05-05 NOTE — H&P (Signed)
Physical Medicine and Rehabilitation Admission H&P    Chief Complaint  Patient presents with  . Shortness of Breath    Pt speaks [place]  : HPI: [name] is a 71 y.o. right handed limited English-speaking male with history of CAD/CABG 2007 in [place], diabetes mellitus, systolic congestive heart failure, hypertension, CVA as well as questionable medical compliance. Patient lives with family. History taken from daughter, who also translates. Independent with a cane prior to admission. One level home with 4 steps to entry. Presented 04/24/2017 with increasing shortness of breath as well as ischemic right leg. Patient did require intubation. Chest x-ray cardiac enlargement without failure. CT of the head showed small focus of hypoattenuation within the right insula compatible with acute infarction. 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 CT angiogram showed occlusion of the right SFA just after the origin. Underwent right femoral-popliteal thromboembolectomy, right below the knee popliteal artery endarterectomy 04/25/2017 per Dr. Bridgett Larsson. Postoperatively a Foley catheter tube was placed with findings of bladder neck contracture urology consulted underwent cystoscopy for placement of Foley tube. Postoperatively noted left-sided weakness with neurology consulted and MRI showed faint ischemic changes involving the right insula out and basal ganglia. Echocardiogram with ejection fraction of 25% diffuse hypokinesis. No cardiac source of emboli. Acute on chronic anemia 9.6-12.0. Patient currently maintained on heparin as well as Coumadin therapy.Tolerating a regular diet. Physical therapy evaluation completed and ongoing with recommendations of physical medicine rehabilitation consult.Patient was admitted for a comprehensive rehabilitation program  Review of Systems  Unable to perform ROS: Language   Past Medical History:    Diagnosis Date  . CAD (coronary artery disease) 2007   CABG 2007 in [place]  . CHF (congestive heart failure) ([place])   . Diabetes mellitus without complication ([place])   . Hyperlipidemia   . Hypertension   . Pulmonary embolism ([place])   . Stroke [facility])    Past Surgical History:  Procedure Laterality Date  . CARDIAC SURGERY    . CYSTOSCOPY N/A 04/25/2017   Procedure: CYSTOSCOPY FLEXIBLE;  Surgeon: Conrad Zuehl, MD;  Location: [place];  Service: Vascular;  Laterality: N/A;  . EMBOLECTOMY Right 04/25/2017   Procedure: EMBOLECTOMY RIGHT FEMORAL ARTERY;  Surgeon: Conrad [place]ville, MD;  Location: [place];  Service: Vascular;  Laterality: Right;  . IR PERCUTANEOUS ART THROMBECTOMY/INFUSION INTRACRANIAL INC DIAG ANGIO  04/25/2017  . PATCH ANGIOPLASTY Right 04/25/2017   Procedure: PATCH ANGIOPLASTY USING [place];  Surgeon: Conrad Prospect, MD;  Location: [place];  Service: Vascular;  Laterality: Right;  . PROSTATE SURGERY  2002  . RADIOLOGY WITH ANESTHESIA N/A 04/24/2017   Procedure: RADIOLOGY WITH ANESTHESIA Code Stroke;  Surgeon: Corrie Mckusick, DO;  Location: [place];  Service: Anesthesiology;  Laterality: N/A;   Family History  Problem Relation Age of Onset  . Hypertension Mother   . Hypertension Sister    Social History:  reports that he has never smoked. He has never used smokeless tobacco. He reports that he does not drink alcohol or use drugs. Allergies: No Known Allergies Medications Prior to Admission  Medication Sig Dispense Refill  . acetaminophen (TYLENOL) 650 MG CR tablet Take 650 mg by mouth at bedtime. FOR LEG PAIN    . atorvastatin (LIPITOR) 80 MG tablet Take 1 tablet (80 mg total) by mouth daily at 6 PM. 30 tablet 11  . carvedilol (COREG) 3.125 MG tablet Take 1 tablet (3.125 mg total) by  mouth 2 (two) times daily with a meal. 60 tablet 11  . feeding supplement, GLUCERNA SHAKE, (GLUCERNA SHAKE) LIQD Take 237 mLs by mouth 2 (two) times daily between meals.    . insulin glargine  (LANTUS) 100 UNIT/ML injection Inject 0.05 mLs (5 Units total) into the skin at bedtime. 10 mL 11  . meloxicam (MOBIC) 7.5 MG tablet Take 2 tablets (15 mg total) by mouth daily. (Patient taking differently: Take 7.5 mg by mouth 2 (two) times daily. ) 30 tablet 0  . metFORMIN (GLUCOPHAGE) 1000 MG tablet Take 1 tablet (1,000 mg total) by mouth daily with breakfast. 30 tablet 11  . [DISCONTINUED] aspirin EC 325 MG tablet Take 1 tablet (325 mg total) by mouth daily. 30 tablet 11  . [DISCONTINUED] furosemide (LASIX) 20 MG tablet Take 1 tablet (20 mg total) by mouth daily. 30 tablet 6  . [DISCONTINUED] losartan (COZAAR) 25 MG tablet Take 1 tablet (25 mg total) by mouth daily. 30 tablet 11  . Blood Glucose Monitoring Suppl (TRUE METRIX METER) DEVI 1 kit by Does not apply route 4 (four) times daily. 1 Device 0  . glucose blood (TRUE METRIX BLOOD GLUCOSE TEST) test strip Use as instructed 100 each 12  . TRUEPLUS LANCETS 28G MISC 28 g by Does not apply route 4 (four) times daily. 120 each 11    Drug Regimen Review  Drug regimen was reviewed and remains appropriate with no significant issues identified  Home: Home Living Family/patient expects to be discharged to:: Private residence Living Arrangements: Children Available Help at Discharge: Family, Available 24 hours/day Type of Home: House Home Access: Stairs to enter Technical brewer of Steps: 4 Home Layout: One level Bathroom Shower/Tub: Chiropodist: Standard Home Equipment: Cane - single point   Functional History: Prior Function Level of Independence: Independent with assistive device(s) Comments: used SPC for ambulation  Functional Status:  Mobility: Bed Mobility Overal bed mobility: Modified Independent Bed Mobility: Rolling, Sidelying to Sit Rolling: Min assist Sidelying to sit: Mod assist, +2 for physical assistance, HOB elevated Supine to sit: Mod assist, +2 for physical assistance, HOB elevated Sit to  supine: Mod assist, +2 for physical assistance, HOB elevated General bed mobility comments: increased time and effort Transfers Overall transfer level: Needs assistance Equipment used: Rolling walker (2 wheeled), None Transfers: Sit to/from Stand Sit to Stand: Min assist Stand pivot transfers: Mod assist General transfer comment: assist to steady upon standing and for safety when sitting; cues for safe hand placement and for follow through with RW Ambulation/Gait Ambulation/Gait assistance: +2 safety/equipment, Min assist, Mod assist (+2 for chair follow) Ambulation Distance (Feet):  (270f total with 2 seated rest breaks ) Assistive device: Rolling walker (2 wheeled) Gait Pattern/deviations: Decreased step length - left, Step-through pattern, Trunk flexed, Decreased step length - right, Drifts right/left (drifting R) General Gait Details: L AFO donned for gait this session; Pt with decreased L knee hyperextension and with improved L foot clearance with L AFO; with fatigue pt continues to demonstrate difficulty with L LE advancement and requires seated breaks; multimodal cues for posture and frequent cues for safe use of AD as pt was more impulsive and drifted to R side; assist required for balance and management of RW as pt tended to have difficutly navigating objects on R side of hallway Gait velocity: decreased Gait velocity interpretation: Below normal speed for age/gender    ADL: ADL Overall ADL's : Needs assistance/impaired Grooming: Sitting, Supervision/safety Grooming Details (indicate cue type and reason):  Min assist for sitting balance. VC's to initiate grooming tasks.  Upper Body Dressing : Moderate assistance Upper Body Dressing Details (indicate cue type and reason): To change gown due to bed wet on arrival.  Lower Body Dressing: Total assistance Toilet Transfer: Minimal assistance, Moderate assistance, +2 for physical assistance Toilet Transfer Details (indicate cue type and  reason): Able to power up into standing with min assist +2 and requiring mod assist +2 to maintain balance. Able to ambulate pushing recliner for UE support with min assist +2. Functional mobility during ADLs: Moderate assistance, +2 for physical assistance General ADL Comments: Facilitated improved ability to complete grooming tasks in seated position this session. Able to follow all commands well. Facilitated improved use of L UE and noted slight inattention at times. However, there is also a cultural aspect as in pt's home country he reports it is highly uncommon to use non-dominant hand for any tasks.   Cognition: Cognition Overall Cognitive Status: Impaired/Different from baseline Orientation Level: Oriented X4 Cognition Arousal/Alertness: Awake/alert Behavior During Therapy: WFL for tasks assessed/performed, Impulsive Overall Cognitive Status: Impaired/Different from baseline Area of Impairment: Problem solving, Awareness, Safety/judgement Current Attention Level: Selective Following Commands: Follows one step commands consistently, Follows multi-step commands with increased time Safety/Judgement: Decreased awareness of deficits, Decreased awareness of safety Awareness: Emergent Problem Solving: Requires verbal cues General Comments: pt was more impulsive this session compared to previous session  Difficult to assess due to: Non-English speaking (daughter translated)  Physical Exam: Blood pressure 95/61, pulse 88, temperature 98 F (36.7 C), temperature source Oral, resp. rate 18, height 6' (1.829 m), weight 79.6 kg (175 lb 6.4 oz), SpO2 98 %. Physical Exam  Vitals reviewed. Constitutional: He appears well-developed and well-nourished.  HENT:  Head: Normocephalic and atraumatic.  Eyes: EOM are normal. Right eye exhibits no discharge. Left eye exhibits no discharge.  Neck: Normal range of motion. Neck supple. No thyromegaly present.  Cardiovascular: Normal rate and regular rhythm.    Respiratory: Effort normal and breath sounds normal. No respiratory distress.  GI: Soft. Bowel sounds are normal. He exhibits no distension.  Musculoskeletal: He exhibits tenderness. He exhibits no edema.  Neurological: He is alert.  Patient makes eye contact with examiner.  Limited English-speaking.  Follows simple commands.  RUE 4+/5.  LUE 4+/5 prox to distal.  RLE: 4/5 proximal to distal LLE: 4-4+/5 prox to distal.   Skin: Skin is warm and dry.  Staples to RLE c/d/i Ulceration right third toe  Psychiatric:  Unable to assess due to language   Results for orders placed or performed during the hospital encounter of 04/24/17 (from the past 48 hour(s))  Glucose, capillary     Status: Abnormal   Collection Time: 05/03/17  8:57 PM  Result Value Ref Range   Glucose-Capillary 227 (H) 65 - 99 mg/dL  CBC     Status: Abnormal   Collection Time: 05/04/17  4:13 AM  Result Value Ref Range   WBC 8.3 4.0 - 10.5 K/uL   RBC 4.70 4.22 - 5.81 MIL/uL   Hemoglobin 11.9 (L) 13.0 - 17.0 g/dL   HCT 32.9 (L) 39.0 - 52.0 %   MCV 70.0 (L) 78.0 - 100.0 fL   MCH 25.3 (L) 26.0 - 34.0 pg   MCHC 36.2 (H) 30.0 - 36.0 g/dL   RDW 14.6 11.5 - 15.5 %   Platelets 265 150 - 400 K/uL  Heparin level (unfractionated)     Status: None   Collection Time: 05/04/17  4:13 AM  Result Value Ref Range   Heparin Unfractionated 0.56 0.30 - 0.70 IU/mL    Comment:        IF HEPARIN RESULTS ARE BELOW EXPECTED VALUES, AND PATIENT DOSAGE HAS BEEN CONFIRMED, SUGGEST FOLLOW UP TESTING OF ANTITHROMBIN III LEVELS.   Protime-INR     Status: Abnormal   Collection Time: 05/04/17  4:13 AM  Result Value Ref Range   Prothrombin Time 20.2 (H) 11.4 - 15.2 seconds   INR 0.35   Basic metabolic panel     Status: Abnormal   Collection Time: 05/04/17  4:13 AM  Result Value Ref Range   Sodium 135 135 - 145 mmol/L   Potassium 3.8 3.5 - 5.1 mmol/L   Chloride 99 (L) 101 - 111 mmol/L   CO2 26 22 - 32 mmol/L   Glucose, Bld 167 (H) 65  - 99 mg/dL   BUN 27 (H) 6 - 20 mg/dL   Creatinine, Ser 1.25 (H) 0.61 - 1.24 mg/dL   Calcium 9.6 8.9 - 10.3 mg/dL   GFR calc non Af Amer 57 (L) >60 mL/min   GFR calc Af Amer >60 >60 mL/min    Comment: (NOTE) The eGFR has been calculated using the CKD EPI equation. This calculation has not been validated in all clinical situations. eGFR's persistently <60 mL/min signify possible Chronic Kidney Disease.    Anion gap 10 5 - 15  Glucose, capillary     Status: Abnormal   Collection Time: 05/04/17  7:32 AM  Result Value Ref Range   Glucose-Capillary 156 (H) 65 - 99 mg/dL   Comment 1 Notify RN   Glucose, capillary     Status: Abnormal   Collection Time: 05/04/17 11:40 AM  Result Value Ref Range   Glucose-Capillary 165 (H) 65 - 99 mg/dL   Comment 1 Notify RN   Glucose, capillary     Status: Abnormal   Collection Time: 05/04/17  4:34 PM  Result Value Ref Range   Glucose-Capillary 247 (H) 65 - 99 mg/dL   Comment 1 Notify RN   Glucose, capillary     Status: Abnormal   Collection Time: 05/04/17  9:43 PM  Result Value Ref Range   Glucose-Capillary 218 (H) 65 - 99 mg/dL  CBC     Status: Abnormal   Collection Time: 05/05/17  4:33 AM  Result Value Ref Range   WBC 8.6 4.0 - 10.5 K/uL   RBC 4.76 4.22 - 5.81 MIL/uL   Hemoglobin 12.0 (L) 13.0 - 17.0 g/dL   HCT 33.3 (L) 39.0 - 52.0 %   MCV 70.0 (L) 78.0 - 100.0 fL   MCH 25.2 (L) 26.0 - 34.0 pg   MCHC 36.0 30.0 - 36.0 g/dL   RDW 14.5 11.5 - 15.5 %   Platelets 282 150 - 400 K/uL  Heparin level (unfractionated)     Status: None   Collection Time: 05/05/17  4:33 AM  Result Value Ref Range   Heparin Unfractionated 0.53 0.30 - 0.70 IU/mL    Comment:        IF HEPARIN RESULTS ARE BELOW EXPECTED VALUES, AND PATIENT DOSAGE HAS BEEN CONFIRMED, SUGGEST FOLLOW UP TESTING OF ANTITHROMBIN III LEVELS.   Basic metabolic panel     Status: Abnormal   Collection Time: 05/05/17  4:33 AM  Result Value Ref Range   Sodium 133 (L) 135 - 145 mmol/L    Potassium 4.2 3.5 - 5.1 mmol/L   Chloride 97 (L) 101 - 111 mmol/L   CO2 25 22 -  32 mmol/L   Glucose, Bld 169 (H) 65 - 99 mg/dL   BUN 35 (H) 6 - 20 mg/dL   Creatinine, Ser 1.37 (H) 0.61 - 1.24 mg/dL   Calcium 9.5 8.9 - 10.3 mg/dL   GFR calc non Af Amer 51 (L) >60 mL/min   GFR calc Af Amer 59 (L) >60 mL/min    Comment: (NOTE) The eGFR has been calculated using the CKD EPI equation. This calculation has not been validated in all clinical situations. eGFR's persistently <60 mL/min signify possible Chronic Kidney Disease.    Anion gap 11 5 - 15  Protime-INR     Status: Abnormal   Collection Time: 05/05/17  4:33 AM  Result Value Ref Range   Prothrombin Time 21.7 (H) 11.4 - 15.2 seconds   INR 1.91   Glucose, capillary     Status: Abnormal   Collection Time: 05/05/17  7:54 AM  Result Value Ref Range   Glucose-Capillary 186 (H) 65 - 99 mg/dL   Comment 1 Notify RN   Glucose, capillary     Status: Abnormal   Collection Time: 05/05/17 11:04 AM  Result Value Ref Range   Glucose-Capillary 228 (H) 65 - 99 mg/dL   Comment 1 Notify RN   Glucose, capillary     Status: Abnormal   Collection Time: 05/05/17  4:21 PM  Result Value Ref Range   Glucose-Capillary 221 (H) 65 - 99 mg/dL   Comment 1 Notify RN    No results found.   Medical Problem List and Plan: 1.  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  DVT Prophylaxis/Anticoagulation: Heparin ggt/Coumadin therapy. Monitor for any bleeding episodes 3. Pain Management: Tylenol as needed 4. Mood: Provide Emotional support 5. Neuropsych: This patient is capable of making decisions on his own behalf. 6. Skin/Wound Care: Routine skin checks 7. Fluids/Electrolytes/Nutrition: Routine I&O with follow-up chemistries 8. CAD / CABG 2007 in [place]. No chest pain or shortness of breath. Continue aspirin therapy. Follow-up cardiology services 9.  Diabetes mellitus with peripheral neuropathy. Hemoglobin A1c 8.8-.SSI. Check blood sugars before meals and at bedtime. Patient on Lantus insulin 5 units daily at bedtime as well as Glucophage 1000 mg daily prior to admission. Resume as needed 10. Systolic congestive heart failure. Lasix 80 mg daily. Monitor for any signs of fluid overload 11. Hypertension. Aldactone 50 mg daily, Cozaar 50 mg daily. Monitor with increased mobility 12. Hyperlipidemia. Lipitor 13. Acute on chronic anemia. Follow-up CBC 14. Questionable medical compliance. Counseling   Post Admission Physician Evaluation: 1. Preadmission assessment reviewed and changes made below. 2. Functional deficits secondary  to right insular infarction after right lower extremity. 3. Patient is admitted to receive collaborative, interdisciplinary care between the physiatrist, rehab nursing staff, and therapy team. 4. Patient's level of medical complexity and substantial therapy needs in context of that medical necessity cannot be provided at a lesser intensity of care such as a SNF. 5. Patient has experienced substantial functional loss from his/her baseline which was documented above under the "Functional History" and "Functional Status" headings.  Judging by the patient's diagnosis, physical exam, and functional history, the patient has potential for functional progress which will result in measurable gains while on inpatient rehab.  These gains will be of substantial and practical use upon discharge  in facilitating mobility and self-care at the household level. 6. Physiatrist will provide 24 hour management of medical needs as well as oversight of the therapy plan/treatment  and provide guidance as appropriate regarding the interaction of the two. 7. 24 hour rehab nursing will assist with safety, skin/wound care, disease management, pain management and patient education  and help integrate therapy concepts, techniques,education, etc. 8. PT will  assess and treat for/with: Lower extremity strength, range of motion, stamina, balance, functional mobility, safety, adaptive techniques and equipment, woundcare, coping skills, pain control, stroke education.   Goals are: Supervision. 9. OT will assess and treat for/with: ADL's, functional mobility, safety, upper extremity strength, adaptive techniques and equipment, wound mgt, ego support, and community reintegration.   Goals are: Supervision. Therapy may not proceed with showering this patient. 10. Case Management and Social Worker will assess and treat for psychological issues and discharge planning. 11. Team conference will be held weekly to assess progress toward goals and to determine barriers to discharge. 12. Patient will receive at least 3 hours of therapy per day at least 5 days per week. 13. ELOS: 16-19 days.       14. Prognosis:  good     Delice Lesch, MD, ABPMR Lauraine Rinne J., PA-C 05/05/2017

## 2017-05-05 NOTE — Progress Notes (Addendum)
Physical Therapy Treatment Patient Details Name: [name] MRN: [id number] DOB: [birth year] Today's Date: 05/05/2017    History of Present Illness 71 y.o.malewith a history of MI and CABG 2007, DM type 2, HTN, stroke and HLD who presented for SOB possible CHF exacerbation. During hosptial course developed facial droop, Lt side weakness. Code stroke called, imaging CTA + Right MCA M1 embolic occlusion. Patient taken to IR and received TPA.    PT Comments    Patient continues to demonstrate L LE weakness. Pt tolerated gait training with L AFO this session and demonstrated improved L foot clearance and decreased L knee hyperextension. Pt however does still require assistance and seated rest breaks for safe gait training due to L LE fatigue and difficulty with advancement. Pt demonstrated increased impulsivity and a tendency to drift toward R side and running into objects on R side of hallway which is different from previous session. Daughter translated. Current plan remains appropriate.   Follow Up Recommendations  CIR;Supervision/Assistance - 24 hour     Equipment Recommendations  Other (comment) (TBA)    Recommendations for Other Services Rehab consult     Precautions / Restrictions Precautions Precautions: Fall Precaution Comments: left post op shoe from fractured toe  (pt declined to wear this session)    Mobility  Bed Mobility Overal bed mobility: Modified Independent             General bed mobility comments: increased time and effort  Transfers Overall transfer level: Needs assistance Equipment used: Rolling walker (2 wheeled);None Transfers: Sit to/from Stand Sit to Stand: Min assist         General transfer comment: assist to steady upon standing and for safety when sitting; cues for safe hand placement and for follow through with RW  Ambulation/Gait Ambulation/Gait assistance: +2 safety/equipment;Min assist;Mod assist (+2 for chair follow) Ambulation  Distance (Feet):  (268ft total with 2 seated rest breaks ) Assistive device: Rolling walker (2 wheeled) Gait Pattern/deviations: Decreased step length - left;Step-through pattern;Trunk flexed;Decreased step length - right;Drifts right/left (drifting R) Gait velocity: decreased   General Gait Details: L AFO donned for gait this session; Pt with decreased L knee hyperextension and with improved L foot clearance with L AFO; with fatigue pt continues to demonstrate difficulty with L LE advancement and requires seated breaks; multimodal cues for posture and frequent cues for safe use of AD as pt was more impulsive and drifted to R side; assist required for balance and management of RW as pt tended to have difficutly navigating objects on R side of hallway   Stairs            Wheelchair Mobility    Modified Rankin (Stroke Patients Only) Modified Rankin (Stroke Patients Only) Pre-Morbid Rankin Score: No symptoms Modified Rankin: Moderately severe disability     Balance Overall balance assessment: Needs assistance Sitting-balance support: Feet supported Sitting balance-Leahy Scale: Fair     Standing balance support: During functional activity;Bilateral upper extremity supported Standing balance-Leahy Scale: Poor                              Cognition Arousal/Alertness: Awake/alert Behavior During Therapy: WFL for tasks assessed/performed;Impulsive Overall Cognitive Status: Impaired/Different from baseline Area of Impairment: Problem solving;Awareness;Safety/judgement                   Current Attention Level: Selective   Following Commands: Follows one step commands consistently;Follows multi-step commands with increased time Safety/Judgement:  Decreased awareness of deficits;Decreased awareness of safety Awareness: Emergent Problem Solving: Requires verbal cues General Comments: pt was more impulsive this session compared to previous session        Exercises      General Comments        Pertinent Vitals/Pain Pain Assessment: No/denies pain    Home Living                      Prior Function            PT Goals (current goals can now be found in the care plan section) Acute Rehab PT Goals PT Goal Formulation: With family Time For Goal Achievement: 05/10/17 Potential to Achieve Goals: Good Progress towards PT goals: Progressing toward goals    Frequency    Min 4X/week      PT Plan Current plan remains appropriate    Co-evaluation              AM-PAC PT "6 Clicks" Daily Activity  Outcome Measure  Difficulty turning over in bed (including adjusting bedclothes, sheets and blankets)?: A Little Difficulty moving from lying on back to sitting on the side of the bed? : A Little Difficulty sitting down on and standing up from a chair with arms (e.g., wheelchair, bedside commode, etc,.)?: Unable Help needed moving to and from a bed to chair (including a wheelchair)?: A Little Help needed walking in hospital room?: A Lot Help needed climbing 3-5 steps with a railing? : A Lot 6 Click Score: 14    End of Session Equipment Utilized During Treatment: Gait belt;Other (comment) (L AFO) Activity Tolerance: Patient tolerated treatment well Patient left: in chair;with call bell/phone within reach;with family/visitor present;with chair alarm set Nurse Communication: Mobility status PT Visit Diagnosis: Difficulty in walking, not elsewhere classified (R26.2);Other symptoms and signs involving the nervous system (R29.898)     Time: 4098-1191 PT Time Calculation (min) (ACUTE ONLY): 38 min  Charges:  $Gait Training: 23-37 mins $Therapeutic Activity: 8-22 mins                    G Codes:       Erline Levine, PTA Pager: [phone]     Carolynne Edouard 05/05/2017, 4:05 PM

## 2017-05-05 NOTE — Progress Notes (Signed)
Rehab admissions - a bed has become available today on inpatient rehab and will admit to rehab today.  Patient and his daughter are agreeable.  Call me for questions.  #[phone]

## 2017-05-05 NOTE — Progress Notes (Signed)
Nutrition Follow-up  DOCUMENTATION CODES:   Not applicable  INTERVENTION:   -Continue Premier Protein Shakes  NUTRITION DIAGNOSIS:   Inadequate oral intake related to inability to eat as evidenced by NPO status.  Being addressed as diet advanced, po 72%, supplements  GOAL:   Patient will meet greater than or equal to 90% of their needs  Progressing  MONITOR:   PO intake, Supplement acceptance, Labs, Weight trends  REASON FOR ASSESSMENT:   Ventilator, Malnutrition Screening Tool    ASSESSMENT:   71 yo male developed shortness of breath, facial droop, Lt side weakness.  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  Given tPA, intubated for airway protection, and had thrombectomy of Rt M1.  Developed Rt leg ischemia after procedure likely from embolic event.  Recorded po intake 72% of meals, drinking Premier Protein shakes  Labs: sodium 133, Creatinine 1.37, CBGs 156-247 Meds: lasix, ss novolog  Diet Order:  Diet heart healthy/carb modified Room service appropriate? Yes; Fluid consistency: Thin  Skin:  Wound (see comment) (R leg incision, R 3rd toe DM ulcer)  Last BM:  9/16  Height:   Ht Readings from Last 1 Encounters:  05/01/17 6' (1.829 m)    Weight:   Wt Readings from Last 1 Encounters:  05/05/17 175 lb 6.4 oz (79.6 kg)    Ideal Body Weight:  80.91 kg  BMI:  Body mass index is 23.79 kg/m.  Estimated Nutritional Needs:   Kcal:  2100-2400kcal/day   Protein:  102-120g/day   Fluid:  >2L/day   EDUCATION NEEDS:   No education needs identified at this time  Romelle Starcher MS, RD, LDN [phone] Pager  [phone] Weekend/On-Call Pager

## 2017-05-05 NOTE — Clinical Social Work Note (Addendum)
CSW continues to follow for discharge needs.  Charlynn Court, CSW [phone]  4:30 pm Patient discharging to CIR today.  CSW signing off.   Charlynn Court, CSW [phone]

## 2017-05-05 NOTE — Progress Notes (Signed)
Inpatient Diabetes Program Recommendations  AACE/ADA: New Consensus Statement on Inpatient Glycemic Control (2015)  Target Ranges:  Prepandial:   less than 140 mg/dL      Peak postprandial:   less than 180 mg/dL (1-2 hours)      Critically ill patients:  140 - 180 mg/dL   Results for [name], [name] (MRN [id number]) as of 05/05/2017 12:49  Ref. Range 05/04/2017 07:32 05/04/2017 11:40 05/04/2017 16:34 05/04/2017 21:43  Glucose-Capillary Latest Ref Range: 65 - 99 mg/dL 132 (H) 440 (H) 102 (H) 218 (H)   Results for [name], [name] (MRN [id number]) as of 05/05/2017 12:49  Ref. Range 05/05/2017 07:54 05/05/2017 11:04  Glucose-Capillary Latest Ref Range: 65 - 99 mg/dL 347 (H) 425 (H)    Home DM Meds: Lantus 5 units QHS       Metformin 1000 mg daily  Current Insulin Orders: Novolog Sensitive Correction Scale/ SSI (0-9 units) TID AC + HS      MD- Please consider the following in-hospital insulin adjustments:  1. Start Lantus 5 units QHS (home dose)  2. Increase Novolog SSI to Novolog Moderate Correction Scale/ SSI (0-15 units) TID AC + HS      --Will follow patient during hospitalization--  Ambrose Finland RN, MSN, CDE Diabetes Coordinator Inpatient Glycemic Control Team Team Pager: [phone] (8a-5p)

## 2017-05-05 NOTE — Progress Notes (Signed)
Patient ID: [name], male   DOB: November 11, 1945, 71 y.o.   MRN: [id number] Patient and family arrived from 65E09 with RN and patient belongings. Patient arrived in low bed and injury bundle implemented. Patient and family oriented to rehab schedule, process and nurse call system. Patient resting comfortably in bed with family at bed side.

## 2017-05-05 NOTE — Progress Notes (Addendum)
Progress Note  Patient Name: [name] Date of Encounter: 05/05/2017  Primary Cardiologist: Dr. Jens Som   Subjective   Doing well this morning. He still has left sided weakness that is gradually improving. He denies CP and dyspnea.   Inpatient Medications    Scheduled Meds: . aspirin EC  81 mg Oral Daily  . atorvastatin  80 mg Oral q1800  . docusate sodium  100 mg Oral Daily  . furosemide  80 mg Oral Daily  . insulin aspart  0-5 Units Subcutaneous QHS  . insulin aspart  0-9 Units Subcutaneous TID WC  . losartan  50 mg Oral Daily  . mouth rinse  15 mL Mouth Rinse BID  . mupirocin cream   Topical Daily  . pantoprazole  40 mg Oral Daily  . potassium chloride  20 mEq Oral BID  . protein supplement shake  11 oz Oral BID BM  . spironolactone  50 mg Oral Daily  . warfarin   Does not apply Once  . Warfarin - Pharmacist Dosing Inpatient   Does not apply q1800   Continuous Infusions: . sodium chloride Stopped (04/28/17 0053)  . heparin 1,200 Units/hr (05/04/17 2144)  . magnesium sulfate 1 - 4 g bolus IVPB     PRN Meds: acetaminophen, guaiFENesin-dextromethorphan, magnesium sulfate 1 - 4 g bolus IVPB, morphine injection, ondansetron (ZOFRAN) IV, phenol   Vital Signs    Vitals:   05/03/17 2032 05/04/17 0500 05/04/17 1125 05/05/17 0448  BP: 107/63 113/65 (!) 125/54 112/64  Pulse: 94 82 81 87  Resp: Temp: 98.2 F (36.8 C) 97.9 F (36.6 C) (!) 97.4 F (36.3 C) 98.1 F (36.7 C)  TempSrc: Oral Oral Oral Oral  SpO2: 98% 98% 100% 98%  Weight:  174 lb 8 oz (79.2 kg)  175 lb 6.4 oz (79.6 kg)  Height:        Intake/Output Summary (Last 24 hours) at 05/05/17 0913 Last data filed at 05/05/17 0902  Gross per 24 hour  Intake              120 ml  Output              800 ml  Net             -680 ml   Filed Weights   05/03/17 0644 05/04/17 0500 05/05/17 0448  Weight: 177 lb 8 oz (80.5 kg) 174 lb 8 oz (79.2 kg) 175 lb 6.4 oz (79.6 kg)    Telemetry    NSR with  frequent PVCs - bigeminy - Personally Reviewed  ECG    SR with LVH and PVCs - Personally Reviewed  Physical Exam   GEN: No acute distress.   Neck: No JVD Cardiac: SR w/ PVCs, regular rate no murmurs, rubs, or gallops.  Respiratory: Clear to auscultation bilaterally. GI: Soft, nontender, non-distended  MS: No edema;  right LE thrombectomy incision is stable Neuro:  left sided weakness s/p CVA  Psych: Normal affect   Labs    Chemistry Recent Labs Lab 05/03/17 1152 05/04/17 0413 05/05/17 0433  NA 134* 135 133*  K 3.9 3.8 4.2  CL 96* 99* 97*  CO2 GLUCOSE 208* 167* 169*  BUN 23* 27* 35*  CREATININE 1.15 1.25* 1.37*  CALCIUM 9.2 9.6 9.5  GFRNONAA >60 57* 51*  GFRAA >60 >60 59*  ANIONGAP Hematology Recent Labs Lab 05/03/17 0603 05/04/17 0413 05/05/17  0433  WBC 8.4 8.3 8.6  RBC 4.61 4.70 4.76  HGB 11.8* 11.9* 12.0*  HCT 32.4* 32.9* 33.3*  MCV 70.3* 70.0* 70.0*  MCH 25.6* 25.3* 25.2*  MCHC 36.4* 36.2* 36.0  RDW 14.6 14.6 14.5  PLT 238 265 282    Cardiac EnzymesNo results for input(s): TROPONINI in the last 168 hours. No results for input(s): TROPIPOC in the last 168 hours.   BNPNo results for input(s): BNP, PROBNP in the last 168 hours.   DDimer No results for input(s): DDIMER in the last 168 hours.   Radiology    No results found.  Cardiac Studies   04/18/2017 ECHO Study Conclusions - Left ventricle: The cavity size was moderately dilated. Wall thickness was normal. Systolic function was severely reduced. The estimated ejection fraction was in the range of 20% to 25%. - Aortic valve: There was mild regurgitation. - Mitral valve: There was mild regurgitation.   Patient Profile     71 y.o. male with severe ischemic cardiopathy (EF 20-25%), complicated by acute pulmonary edema, acute hypoxic respiratory failure requiring intubation, hyponatremia, subsequently with acute ischemic R MCAstroke and acute right SFA-popliteal  occlusion, requiring thrombectomy and thrombolytics.  Now 7days following suspected embolic event. Warfarin initiated, still on IV heparin for bridging.   Assessment & Plan    1. Acute on Chronic Systolic and Diastolic CHF: EF 40-98%. He has been treated with IV Lasix. -2.5L out since admit. Appears euvolemic on exam. Breathing improved. Lungs are CTAB. No peripheral edema. Plan is to transition to PO lasix today. 80 mg PO daily is ordered. Continue Losartan and spironolactone. If BP remains stable, we will try to add low dose Coreg. We discussed daily weights once he is discharge.   2.  Acute Embolic Right MCA Stroke: Has been started on Warfarin w/ heparin bridge. INR is close to being therapeutic. INR at 1.9 today. Will need lifelong anticoagulation. Still with left sided weakness. Plan is to send to rehabilitation post discharge. We can follow him in our coumadin clinic.   3.  Acute Right Popliteal Embolic Occlusion: S/p thrombectomy by Dr. Imogene Burn. On Warfain w/ heparin bridge for secondary prevention.   4. CAD: h/o CABG.  Denies anginal symptoms. Nuclear stress test performed just a few days ago showed an extensive inferior and inferolateral scar and EF under 30%. No evidence for acute coronary event. Continue medical therapy.   5. Renal insufficiency: SCr has increased from 1.15>>1.25>>1.37. Pt is being transitioned to PO lasix today. F/u BMP in the am.   6. Hyponatremia: Na back down slightly at 133. IV lasix discontinued. Pt being transitioned to oral Lasix today. F/u CBC in the am.   7. DM: poorly controlled. Hgb A1c 9.4. Lantus initiated this admit. F/u w/ PCP post discharge.   8. HLD: LDL elevated at 93 mg/dL and not at goal. Lipito 80 mg recently initiated. He will need f/u FLP and HFTs in 6-8 weeks.  For questions or updates, please contact [facility] Please consult [email or web address] for contact info under Cardiology/STEMI.      Signed, Robbie Lis, PA-C  05/05/2017, 9:71  AM    71 year old male with severe cardiomyopathy EF 20%, acute on chronic systolic heart failure who was readmitted and during the first day of readmission suffered an in-hospital embolic stroke, right MCA and also right superficial femoral artery embolus. No embolic source was noted on echocardiogram.   - Continue with transitioned to Lasix 80 by mouth daily. Breathing is improved. 2.5 L out.  Lungs are clear. Heart is regular.  - Continue with anticoagulation with warfarin.  - Nuclear stress test shows extensive inferior and inferolateral scarring. Continue with aggressive medical therapy.  - Creatinine slightly increasing after diuresis. By mouth Lasix now.  - Hyponatremia is a marker for prognosis, heart failure.  I think would be reasonable to revisit the possibilities of inpatient rehabilitation at this point. He appears euvolemic.  Donato Schultz, MD

## 2017-05-05 NOTE — Plan of Care (Signed)
Problem: Education: Goal: Understanding of CV disease, CV risk reduction, and recovery process will improve Outcome: Progressing Teach back from patient daughter on fluid balance and importance of weighing and fluid intake monitoring

## 2017-05-05 NOTE — Progress Notes (Signed)
ANTICOAGULATION CONSULT NOTE - Follow up Consult  Pharmacy Consult for Heparin and Warfarin Indication: stroke and SFA thrombus  No Known Allergies  Patient Measurements: Height: 6' (182.9 cm) Weight: 175 lb 6.4 oz (79.6 kg) IBW/kg (Calculated) : 77.6 Heparin Dosing Weight: 87.7kg  Vital Signs: Temp: 98.1 F (36.7 C) (09/17 0448) Temp Source: Oral (09/17 0448) BP: 112/64 (09/17 0448) Pulse Rate: 87 (09/17 0448)  Labs:  Recent Labs  05/03/17 0603 05/03/17 0604 05/03/17 1152 05/04/17 0413 05/05/17 0433  HGB 11.8*  --   --  11.9* 12.0*  HCT 32.4*  --   --  32.9* 33.3*  PLT 238  --   --  265 282  LABPROT 18.6*  --   --  20.2* 21.7*  INR 1.57  --   --  1.74 1.91  HEPARINUNFRC  --  0.43  --  0.56 0.53  CREATININE 1.11  --  1.15 1.25* 1.37*    Estimated Creatinine Clearance: 55.1 mL/min (A) (by C-G formula based on SCr of 1.37 mg/dL (H)).  Assessment: 39 yom presented with an acute stroke and subsequent SFA thrombus. Received tPA 9/7 AM and now transitioned to IV heparin. MRI on 9/10 showing without hemorrhagic conversion. No anticoagulation prior to admission.   On heparin for SFA thrombus + CVA s/p R leg thrombectomy. Warfarin started on 9/10. Heparin level increased slightly above goal range to 0.53 running at 1200 units/hr. H/H and platelets remain stable. INR remains subtherapeutic today at 1.91. No new medication interactions. No signs/symptoms of bleeding noted in chart. PO intake 50-100%. Note SCr has been trending up since 9/13, 1.37 today  Goal of Therapy:  INR 2-3 Heparin level 0.3-0.5 units/ml Monitor platelets by anticoagulation protocol: Yes   Plan:  Decrease heparin gtt to 1150 units/hr Give warfarin 6 mg PO x 1 Monitor daily heparin level / INR, CBC, s/s of bleed   Thank you for allowing [place] to participate in this patients care.  Signe Colt, PharmD Clinical phone for 05/05/2017 from 7a-3:30p: [phone] If after 3:30p, please call main pharmacy  at: [phone] 05/05/2017 9:41 AM

## 2017-05-05 NOTE — Discharge Summary (Signed)
Discharge Summary    Patient ID: [name],  MRN: [id number], DOB/AGE: 28-May-1946 71 y.o.  Admit date: 04/24/2017 Discharge date: 05/05/2017  Primary Care Provider: Patient, No Pcp Per Primary Cardiologist: Dr. Stanford Breed   Discharge Diagnoses    Principal Problem:   Acute on chronic systolic heart failure ([name]) Active Problems:   CHF (congestive heart failure) ([name])   Hyponatremia   Cerebral embolism with cerebral infarction   Endotracheally intubated   Ischemia of left lower extremity   Respiratory failure ([name])   Occlusion of left carotid artery   Urinary retention   Ischemic cardiomyopathy   S/P CABG (coronary artery bypass graft)   Ischemia of right lower extremity   Allergies No Known Allergies  Diagnostic Studies/Procedures    Study Conclusions  - Left ventricle: The cavity size was moderately dilated. Systolic   function was severely reduced. The estimated ejection fraction   was in the range of 20% to 25%. Diffuse hypokinesis. The study is   not technically sufficient to allow evaluation of LV diastolic   function. Acoustic contrast opacification revealed no evidence   ofthrombus. - Ventricular septum: Septal motion showed abnormal function and   dyssynergy. - Mitral valve: Calcified annulus. Mildly thickened leaflets .   There was moderate regurgitation. - Left atrium: The atrium was mildly dilated. - Right ventricle: The cavity size was mildly dilated. Wall   thickness was normal. - Right atrium: The atrium was mildly dilated. - Tricuspid valve: There was mild regurgitation.  Impressions:  - No cardiac source of emboli was indentified.  See Additional Imaging in Radiology section.   History of Present Illness     [name] is a 71 y/o male from [place], who recently moved to the [place]. He speaks [place]. Pt notes h/o CAD s/p CABG in [place] in 2007. Also has a h/o HTN, HLD, DM and previous stroke. He initially was seen and admitted to [facility] on  04/17/17 for complaints of shortness of breath and orthopnea. He had been having occasional bouts of dyspnea with exertion over the previous 3 years but this had gotten progressively worse since arriving in the [place]. He had not chest pain. An echocardiogram was done on 04/18/17 that revealed EF 20-25% with mild AR and mild MR. Troponins were negative. D Dimer was mildly elevated, CTA chest showed no PE> A Lexiscan myoview was done on 04/18/2017 that showed prior MI, no ischemia and EF <30%. He was discharged home on 04/20/17 daily lasix 20 mg, ARB, BB and statin. Discharge wt was 184 lbs. Plan was to recheck echo in 3 months after medication titration to optimal dosing. If EF remains <35% will consider ICD.   Pt presented back to ED on 04/24/17 with recurrent exertional dyspnea and orthopnea. He denied CP. His weight was up from his dry weight. He was readmitted for management of acute on chronic systolic HF and placed on IV diuretics. Day after admission, pt developed acute AMS, right facial droop and left sided weakness and Code Stroke was activated. He was found to have acute right MCA occlusion, felt to be embolic, as well as left popliteal acute embolic occlusion. See management details below.   Hospital Course   1. Acute on Chronic Systolic and Diastolic HF: pt was treated with IV diuretics and had good urinary response. -2.5 L out since admit. He was diuresed back to euvolemic state and transitioned back to PO diuretics. Lasix 80 mg daily. His dyspnea resolved. He was continued on  Losartan and Spironolactone. BB therapy was not initiated given soft BP. As BP improves, he will need addition of a BB for systolic HF. This can be added as an outpatient. Low sodium diet advised. Continue to monitor daily weights and volume status. Plan to obtain f/u 2D echo 3 months after maximally tolerated guidelines directed medical therapy. If EF remains < 35%, he will need referral to EP for consideration for ICD. Medication  titration and monitoring will be conducted as an outpatient, once he is discharged from inpatient cardiac rehab.   2. Acute embolic right MCA stroke: Neurology consulted. s/p right M1 mechanical thrombectomy with TICI3 reperfusion. Pt placed on Coumadin with IV heparin bridge. He will require lifelong anticoagulation. He will need to remain on IV heparin until his INR is therapeutic at 2.0. Pt had good recovery but has residual left sided weakness. Plan to d/c to CIR for therapy.     3. R popliteal acute embolic occlusion: S/p thrombectomy by Dr. Bridgett Larsson. On Warfain w/ heparin bridge for secondary prevention.   4. CAD: h/o CABG.  Denies anginal symptoms. Nuclear stress test performed just a few days ago showed an extensive inferior and inferolateral scar and EF under 30%. No evidence for acute coronary event. Continue medical therapy.   5. HLD: LDL elevated at 93 mg/dL and not at goal. Lipitor 80 mg recently initiated. He will need f/u FLP and HFTs in 6-8 weeks.  6. DM: poorly controlled. Hgb A1c 9.4. Lantus initiated this admit. F/u w/ PCP post discharge.   Pt was seen and examined by Dr. Marlou Porch on 05/05/17 and was felt to be medically stable for discharge to inpatient rehab.  Our office has been notified to schedule patient for post hospital f/u in 3-4 weeks. Please call cardiology prior to discharge from inpatient rehab so that we can get him scheduled in our  Coumadin clinic soon after he is released.   Consultants: neurology and vascular surgery.     Discharge Vitals Blood pressure 95/61, pulse 88, temperature 98 F (36.7 C), temperature source Oral, resp. rate 18, height 6' (1.829 m), weight 175 lb 6.4 oz (79.6 kg), SpO2 98 %.  Filed Weights   05/03/17 0644 05/04/17 0500 05/05/17 0448  Weight: 177 lb 8 oz (80.5 kg) 174 lb 8 oz (79.2 kg) 175 lb 6.4 oz (79.6 kg)    Labs & Radiologic Studies    CBC  Recent Labs  05/04/17 0413 05/05/17 0433  WBC 8.3 8.6  HGB 11.9* 12.0*  HCT 32.9*  33.3*  MCV 70.0* 70.0*  PLT 265 820   Basic Metabolic Panel  Recent Labs  05/03/17 1152 05/04/17 0413 05/05/17 0433  NA 134* 135 133*  K 3.9 3.8 4.2  CL 96* 99* 97*  CO2 '24 26 25  ' GLUCOSE 208* 167* 169*  BUN 23* 27* 35*  CREATININE 1.15 1.25* 1.37*  CALCIUM 9.2 9.6 9.5  MG 1.7  --   --    Liver Function Tests No results for input(s): AST, ALT, ALKPHOS, BILITOT, PROT, ALBUMIN in the last 72 hours. No results for input(s): LIPASE, AMYLASE in the last 72 hours. Cardiac Enzymes No results for input(s): CKTOTAL, CKMB, CKMBINDEX, TROPONINI in the last 72 hours. BNP Invalid input(s): POCBNP D-Dimer No results for input(s): DDIMER in the last 72 hours. Hemoglobin A1C No results for input(s): HGBA1C in the last 72 hours. Fasting Lipid Panel No results for input(s): CHOL, HDL, LDLCALC, TRIG, CHOLHDL, LDLDIRECT in the last 72 hours. Thyroid Function Tests  No results for input(s): TSH, T4TOTAL, T3FREE, THYROIDAB in the last 72 hours.  Invalid input(s): FREET3 _____________  Ct Angio Head W Or Wo Contrast  Result Date: 04/25/2017 CLINICAL DATA:  Code stroke.  71 y/o  M; left-sided weakness. EXAM: CT ANGIOGRAPHY HEAD AND NECK CT HEAD CODE STROKE WITHOUT CONTRAST TECHNIQUE: Multidetector CT imaging of the head and neck was performed using the standard protocol during bolus administration of intravenous contrast. Multiplanar CT image reconstructions and MIPs were obtained to evaluate the vascular anatomy. Carotid stenosis measurements (when applicable) are obtained utilizing NASCET criteria, using the distal internal carotid diameter as the denominator. CONTRAST:  50 cc Isovue 370 COMPARISON:  None. FINDINGS: CT HEAD FINDINGS Brain: Small lucency in the right insula with loss of gray-white differentiation (series 3, image 13). Chronic lacunar infarction within the right superior thalamus. Moderate chronic microvascular ischemic changes of white matter and parenchymal volume loss of the brain.  No acute intracranial hemorrhage identified. No hydrocephalus or effacement of basilar cisterns. Vascular: Right M1 hyperdensity. Extensive calcific atherosclerosis of carotid siphons. Skull: Normal. Negative for fracture or focal lesion. Sinuses: Left maxillary sinus mucous retention cyst. Otherwise negative. Orbits: No acute finding. Review of the MIP images confirms the above findings CTA NECK FINDINGS Aortic arch: 4.2 cm ascending aortic aneurysm. Bovine variant anatomy. Right carotid system: No evidence of dissection, stenosis (50% or greater) or occlusion. The mild calcified plaque of the carotid bifurcation and proximal ICA with mild less than 50% stenosis of proximal ICA. 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 Reconstitution of the left internal carotid artery via external carotid collaterals with poor downstream enhancement of the left internal carotid artery to the terminus indicating slow flow. Vertebral arteries: Right V1 occlusion with reconstitution of the right vertebral artery at the V2 segment. Calcified plaque of left vertebral artery origin with severe stenosis. The left vertebral artery is otherwise normal in caliber through its course in the neck. Skeleton: The cervical degenerative changes greatest at the C4-5 level were disc disease and a small disc bulge results in mild canal stenosis. Other neck: The mediastinal adenopathy in the lower paratracheal and right upper peritracheal stations, probably due to venous congestion from pulmonary edema. Upper chest: Small right pleural effusion. Smooth interlobular septal thickening compatible with interstitial pulmonary edema. Review of the MIP images confirms the above findings CTA HEAD FINDINGS Anterior circulation: Extensive calcified plaque of the right carotid siphon with a short segment of severe distal cavernous and second short segment of paraclinoid severe stenosis. Poor enhancement of the distal left  cavernous and ophthalmic segments may be due to high-grade stenosis or poor antegrade flow due to left common carotid artery occlusion. Right mid M1 occlusion with poor downstream right MCA collateralization. Patent bilateral anterior cerebral arteries and left middle cerebral artery without significant stenosis. Posterior circulation: No significant stenosis, proximal occlusion, aneurysm, or vascular malformation. Mild proximal basilar stenosis. Short segments of mild stenosis in the bilateral P1 segments. Right V4 segment short segment of mild stenosis secondary to calcified plaque. Venous sinuses: As permitted by contrast timing, patent. Anatomic variants: Small anterior communicating artery. No posterior communicating artery identified, likely hypoplastic or absent. 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 ASPECTS = 9 right MCA infarction. No acute hemorrhage. CT head: 1. Small focus of hypoattenuation within right insula compatible with acute infarct, ASPECTS = 9. 2.  Moderate chronic microvascular ischemic changes and moderate parenchymal volume loss of the brain. 3. Small chronic lacunar infarct in right superior thalamus. CTA neck: 1. Left common carotid artery occlusion. Reconstitution of left internal carotid artery from external carotid artery collaterals. Poor enhancement of left internal carotid artery indicating slow flow. 2. No significant stenosis of the right carotid system in the neck. 3. Right V1 occlusion with reconstitution of right vertebral artery at the V2 segment. 4. Severe left vertebral artery origin stenosis secondary to calcified plaque. 5. 4.2 cm aortic aneurysm. Recommend annual imaging followup by CTA or MRA. This recommendation follows 2010 ACCF/AHA/AATS/ACR/ASA/SCA/SCAI/SIR/STS/SVM Guidelines for the Diagnosis and Management of Patients with Thoracic  Aortic Disease. Circulation. 2010; 121: B638-G536. 6. Interstitial pulmonary edema. Enlarged mediastinal lymph nodes is likely related to pulmonary edema. CTA head: 1. Right mid M1 occlusion. Poor downstream right MCA collateralization. 2. Poor enhancement of distal left cavernous and ophthalmic segments of ICA may be due to slow flow secondary to common carotid occlusion or underlying high-grade stenosis. 3. Intracranial atherosclerosis with multiple areas of mild stenosis in the anterior and posterior circulation. 4. No additional large vessel occlusion, aneurysm, or evidence for vascular malformation. These results were called by telephone at the time of interpretation on 04/25/2017 at 12:55 am to Dr. Roland Rack , who verbally acknowledged these results. Electronically Signed   By: Kristine Garbe M.D.   On: 04/25/2017 01:14   Dg Chest 2 View  Result Date: 04/24/2017 CLINICAL DATA:  Short of breath EXAM: CHEST  2 VIEW COMPARISON:  04/17/2017 FINDINGS: Cardiac enlargement without heart failure. Negative for infiltrate or effusion. Negative for mass or adenopathy. Mild right lower lobe atelectasis IMPRESSION: Cardiac enlargement without heart failure Mild right lower lobe atelectasis. Electronically Signed   By: Franchot Gallo M.D.   On: 04/24/2017 10:34   Ct Head Wo Contrast  Result Date: 04/26/2017 CLINICAL DATA:  One day follow-up status post tPA. Status post mechanical thrombectomy RIGHT M1 occlusion. EXAM: CT HEAD WITHOUT CONTRAST TECHNIQUE: Contiguous axial images were obtained from the base of the skull through the vertex without intravenous contrast. COMPARISON:  MRI of the head April 25, 2017 and CT HEAD April 25, 2017 FINDINGS: BRAIN: No intraparenchymal hemorrhage, mass effect nor midline shift. The ventricles and sulci are normal for age. Patchy supratentorial white matter hypodensities. Old RIGHT thalamus lacunar infarct. No acute large vascular territory infarcts. No  abnormal extra-axial fluid collections. Basal cisterns are patent. VASCULAR: Severe calcific atherosclerosis of the carotid siphons. SKULL: No skull fracture. No significant scalp soft tissue swelling. SINUSES/ORBITS: Mild paranasal sinus mucosal thickening. LEFT maxillary mucosal retention cyst.The included ocular globes and orbital contents are non-suspicious. OTHER: Life-support lines in place. IMPRESSION: 1. No acute intracranial process. Known acute RIGHT MCA territory infarcts not apparent by CT. 2. 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 Moderate chronic small vessel ischemic disease. Electronically Signed   By: Elon Alas M.D.   On: 04/26/2017 01:56   Ct Head Wo Contrast  Result Date: 04/25/2017 CLINICAL DATA:  71 y/o M; post transarterial intervention of stroke. EXAM: CT HEAD WITHOUT CONTRAST TECHNIQUE: Contiguous axial images were obtained from the base of the skull through the vertex without intravenous contrast. COMPARISON:  04/25/2017 CT head FINDINGS: Brain: Stable small lucency within the right insula compatible with region of acute infarction. No new area of loss of gray-white differentiation to indicate interval infarction. No acute hemorrhage. Stable background of moderate chronic microvascular ischemic changes and parenchymal volume loss of the brain. No hydrocephalus,  extra-axial collection, or effacement of basilar cisterns. Vascular: The persisting contrast opacification of the dural venous sinuses and circle of Willis. Skull: Normal. Negative for fracture or focal lesion. Sinuses/Orbits: The left maxillary sinus mucous retention cyst. Fluid within the nasal and oropharynx is likely due to intubation. Other: None. IMPRESSION: Stable small lucency within the right insula compatible with region of acute infarction. No new area of infarction identified. No acute hemorrhage. Electronically Signed   By: Kristine Garbe M.D.   On: 04/25/2017 04:44   Ct Angio Neck W Or Wo Contrast  Result  Date: 04/25/2017 CLINICAL DATA:  Code stroke.  71 y/o  M; left-sided weakness. EXAM: CT ANGIOGRAPHY HEAD AND NECK CT HEAD CODE STROKE WITHOUT CONTRAST TECHNIQUE: Multidetector CT imaging of the head and neck was performed using the standard protocol during bolus administration of intravenous contrast. Multiplanar CT image reconstructions and MIPs were obtained to evaluate the vascular anatomy. Carotid stenosis measurements (when applicable) are obtained utilizing NASCET criteria, using the distal internal carotid diameter as the denominator. CONTRAST:  50 cc Isovue 370 COMPARISON:  None. FINDINGS: CT HEAD FINDINGS Brain: Small lucency in the right insula with loss of gray-white differentiation (series 3, image 13). Chronic lacunar infarction within the right superior thalamus. Moderate chronic microvascular ischemic changes of white matter and parenchymal volume loss of the brain. No acute intracranial hemorrhage identified. No hydrocephalus or effacement of basilar cisterns. Vascular: Right M1 hyperdensity. Extensive calcific atherosclerosis of carotid siphons. Skull: Normal. Negative for fracture or focal lesion. Sinuses: Left maxillary sinus mucous retention cyst. Otherwise negative. Orbits: No acute finding. Review of the MIP images confirms the above findings CTA NECK FINDINGS Aortic arch: 4.2 cm ascending aortic aneurysm. Bovine variant anatomy. Right carotid system: No evidence of dissection, stenosis (50% or greater) or occlusion. The mild calcified plaque of the carotid bifurcation and proximal ICA with mild less than 50% stenosis of proximal ICA. 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 Reconstitution of the left internal carotid artery via external carotid collaterals with poor downstream enhancement of the left internal carotid artery to the terminus indicating slow flow. Vertebral arteries: Right V1 occlusion with reconstitution of the right vertebral artery at the  V2 segment. Calcified plaque of left vertebral artery origin with severe stenosis. The left vertebral artery is otherwise normal in caliber through its course in the neck. Skeleton: The cervical degenerative changes greatest at the C4-5 level were disc disease and a small disc bulge results in mild canal stenosis. Other neck: The mediastinal adenopathy in the lower paratracheal and right upper peritracheal stations, probably due to venous congestion from pulmonary edema. Upper chest: Small right pleural effusion. Smooth interlobular septal thickening compatible with interstitial pulmonary edema. Review of the MIP images confirms the above findings CTA HEAD FINDINGS Anterior circulation: Extensive calcified plaque of the right carotid siphon with a short segment of severe distal cavernous and second short segment of paraclinoid severe stenosis. Poor enhancement of the distal left cavernous and ophthalmic segments may be due to high-grade stenosis or poor antegrade flow due to left common carotid artery occlusion. Right mid M1 occlusion with poor downstream right MCA collateralization. Patent bilateral anterior cerebral arteries and left middle cerebral artery without significant stenosis. Posterior circulation: No significant stenosis, proximal occlusion, aneurysm, or vascular malformation. Mild proximal basilar stenosis. Short segments of mild stenosis in the bilateral P1 segments. Right V4 segment short segment of mild stenosis secondary to calcified plaque. Venous sinuses: As permitted by contrast  timing, patent. Anatomic variants: Small anterior communicating artery. No posterior communicating artery identified, likely hypoplastic or absent. 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 ASPECTS = 9 right MCA infarction. No acute hemorrhage. CT head: 1. Small focus of hypoattenuation  within right insula compatible with acute infarct, ASPECTS = 9. 2. Moderate chronic microvascular ischemic changes and moderate parenchymal volume loss of the brain. 3. Small chronic lacunar infarct in right superior thalamus. CTA neck: 1. Left common carotid artery occlusion. Reconstitution of left internal carotid artery from external carotid artery collaterals. Poor enhancement of left internal carotid artery indicating slow flow. 2. No significant stenosis of the right carotid system in the neck. 3. Right V1 occlusion with reconstitution of right vertebral artery at the V2 segment. 4. Severe left vertebral artery origin stenosis secondary to calcified plaque. 5. 4.2 cm aortic aneurysm. Recommend annual imaging followup by CTA or MRA. This recommendation follows 2010 ACCF/AHA/AATS/ACR/ASA/SCA/SCAI/SIR/STS/SVM Guidelines for the Diagnosis and Management of Patients with Thoracic Aortic Disease. Circulation. 2010; 121: Z025-E527. 6. Interstitial pulmonary edema. Enlarged mediastinal lymph nodes is likely related to pulmonary edema. CTA head: 1. Right mid M1 occlusion. Poor downstream right MCA collateralization. 2. Poor enhancement of distal left cavernous and ophthalmic segments of ICA may be due to slow flow secondary to common carotid occlusion or underlying high-grade stenosis. 3. Intracranial atherosclerosis with multiple areas of mild stenosis in the anterior and posterior circulation. 4. No additional large vessel occlusion, aneurysm, or evidence for vascular malformation. These results were called by telephone at the time of interpretation on 04/25/2017 at 12:55 am to Dr. Roland Rack , who verbally acknowledged these results. Electronically Signed   By: Kristine Garbe M.D.   On: 04/25/2017 01:14   Ct Angio Chest Pe W Or Wo Contrast  Result Date: 04/18/2017 CLINICAL DATA:  Shortness of breath for 2-3 weeks EXAM: CT ANGIOGRAPHY CHEST WITH CONTRAST TECHNIQUE: Multidetector CT imaging of the  chest was performed using the standard protocol during bolus administration of intravenous contrast. Multiplanar CT image reconstructions and MIPs were obtained to evaluate the vascular anatomy. CONTRAST:  100 mL Isovue 370 COMPARISON:  None. FINDINGS: Cardiovascular: Satisfactory opacification of the pulmonary arteries to the segmental level. No evidence of pulmonary embolism. Mild cardiomegaly. Prior CABG. Thoracic aortic atherosclerosis. No pericardial effusion. Ascending thoracic aortic aneurysm measuring 4.6 cm in transverse diameter at the level of the right main pulmonary artery. Mediastinum/Nodes: No enlarged mediastinal, hilar, or axillary lymph nodes. Thyroid gland, trachea, and esophagus demonstrate no significant findings. Lungs/Pleura: Hazy left lower lobe airspace disease in the infrahilar region. No focal consolidation. No pleural effusion or pneumothorax. Upper Abdomen: No acute abnormality. Musculoskeletal: No chest wall abnormality. No acute or significant osseous findings. Review of the MIP images confirms the above findings. IMPRESSION: 1. No evidence pulmonary embolus. 2. Hazy left lower lobe airspace disease in the infrahilar region which may reflect atelectasis versus pneumonia. 3. Ascending thoracic aortic aneurysm measuring 4.6 cm. Recommend semi-annual imaging followup by CTA or MRA and referral to cardiothoracic surgery if not already obtained. This recommendation follows 2010 ACCF/AHA/AATS/ACR/ASA/SCA/SCAI/SIR/STS/SVM Guidelines for the Diagnosis and Management of Patients With Thoracic Aortic Disease. Circulation. 2010; 121: e266-e36 Electronically Signed   By: Kathreen Devoid   On: 04/18/2017 14:47   Mr Brain Wo Contrast  Result Date: 04/28/2017 CLINICAL DATA:  Status post mechanical thrombectomy RIGHT M1 occlusion. Focal neurological deficit, suspect stroke. EXAM:  MRI HEAD WITHOUT CONTRAST TECHNIQUE: Multiplanar, multiecho pulse sequences of the brain and surrounding structures were  obtained without intravenous contrast. COMPARISON:  CT HEAD April 26, 2017 and MRI of the head April 25, 2017 FINDINGS: BRAIN: New punctate focus reduced diffusion LEFT frontal cortex (axial diffusion weighted imaging 44/100) Reduced diffusion at RIGHT insula and posterior lenticulostriate nucleus unchanged distribution for prior imaging. Punctate focus reduced diffusion LEFT frontal and LEFT occipital lobes are unchanged. No further propagation. Normalizing ADC values. No new infarcts. No susceptibility artifact to suggest hemorrhage. A few scattered supratentorial chronic micro hemorrhages associated with chronic hypertension. Old RIGHT thalamus lacunar infarct with mineralization. Patchy to confluent supratentorial white matter FLAIR T2 hyperintensities. Old small cerebellar infarcts. Ventricles and sulci are overall normal for patient's age. No midline shift, mass effect or masses. No abnormal extra-axial fluid collections. VASCULAR: Normal major intracranial vascular flow voids present at skull base. SKULL AND UPPER CERVICAL SPINE: No abnormal sellar expansion. No suspicious calvarial bone marrow signal. Craniocervical junction maintained. SINUSES/ORBITS: LEFT maxillary mucosal retention cyst. Trace paranasal sinus mucosal thickening. Trace LEFT mastoid effusion. The included ocular globes and orbital contents are non-suspicious. OTHER: None. IMPRESSION: 1. New, acute punctate LEFT frontal lobe infarct. 2. Evolving acute to subacute RIGHT MCA territory and punctate LEFT frontal and occipital infarcts without further propagation, new infarcts or hemorrhagic conversion. 3. 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 Old RIGHT thalamus and small cerebellar infarcts. Electronically Signed   By: Elon Alas M.D.   On: 04/28/2017 23:09   Mr Jeri Cos CH Contrast  Result Date: 04/25/2017 CLINICAL DATA:  Follow-up examination for stroke, status post transarterial intervention for right M1 EVLO.  EXAM: MRI HEAD WITHOUT AND WITH CONTRAST TECHNIQUE: Multiplanar, multiecho pulse sequences of the brain and surrounding structures were obtained without and with intravenous contrast. CONTRAST:  48m MULTIHANCE GADOBENATE DIMEGLUMINE 529 MG/ML IV SOLN COMPARISON:  Prior CT from earlier the same day as well as previous studies. FINDINGS: Brain: Generalized age-related cerebral atrophy. Patchy and confluent T2/FLAIR hyperintensity within the periventricular and deep white matter both cerebral hemispheres most consistent with chronic microvascular disease, moderate nature. Remote lacunar infarct with associated chronic hemorrhagic blood products present within the right thalamus. Few additional tiny remote bilateral cerebellar infarcts noted. Mild diffusion abnormality present within the posterior right lentiform nucleus extending superiorly into the right caudate (series 4, image 18). Additional faint diffusion abnormality within the right insula, most notable inferiorly (series 3, image 27) findings consistent with acute ischemia. No associated mass effect or hemorrhage. No other areas of acute right MCA territory infarction. Additional punctate 5 mm subcortical nonhemorrhagic infarct noted within the left occipital lobe (series 3, image 26). Gray-white matter differentiation otherwise maintained. No other evidence for acute or subacute ischemia. No evidence for acute intracranial hemorrhage. Few scattered punctate chronic micro hemorrhages noted within the brain and posterior fossa, likely hypertensive in nature. No mass lesion, midline shift or mass effect. No hydrocephalus. No extra-axial fluid collection. Major dural sinuses are grossly patent. No abnormal enhancement. Pituitary gland suprasellar region normal. Vascular: Major intracranial vascular flow voids are maintained. Skull and upper cervical spine: Craniocervical junction within normal limits. Visualized upper cervical spine unremarkable. Bone marrow  signal intensity within normal limits. Probable benign hemangioma noted within the C2 vertebral body. No scalp soft tissue abnormality. Sinuses/Orbits: Globes and orbital soft tissues within normal limits. Retention cysts noted within the left maxillary sinus. Paranasal sinuses otherwise largely clear. No mastoid effusion. Inner ear structures normal. Other: Subcentimeter T2  hyperintensity noted within the left parotid gland, of doubtful significance. IMPRESSION: 1. Faint ischemic changes involving the right insula out and basal ganglia as above. No associated hemorrhage or mass effect. 2. Additional punctate 5 mm subcortical left occipital lobe infarct. 3. Remote right thalamic lacunar infarct with additional tiny remote bilateral cerebellar infarcts. 4. Atrophy with moderate chronic microvascular ischemic disease. Electronically Signed   By: Jeannine Boga M.D.   On: 04/25/2017 14:24   Ct Angio Ao+bifem W & Or Wo Contrast  Result Date: 04/25/2017 CLINICAL DATA:  71 year old male with a history of admission for congestive heart failure 24 hours prior to acute inpatient clinical stroke with emergent large vessel occlusion of the right middle cerebral artery. Status post treatment with both mechanical thrombectomy and systemic, weight based dose of tPA, the right MCA flow has been restored, however, right foot pulses were discovered to be absent. Diagnostic CT imaging was performed to evaluate the right lower extremity arterial system. EXAM: CT ANGIOGRAPHY OF ABDOMINAL AORTA WITH ILIOFEMORAL RUNOFF TECHNIQUE: Multidetector CT imaging of the abdomen, pelvis and lower extremities was performed using the standard protocol during bolus administration of intravenous contrast. Multiplanar CT image reconstructions and MIPs were obtained to evaluate the vascular anatomy. CONTRAST:  100 cc Isovue 3 7 COMPARISON:  Chest CT 04/18/2017 FINDINGS: VASCULAR Aorta: Atherosclerotic plaque of the abdominal aorta with mixed  calcified and soft plaque. No aneurysm or dissection of the visualized lower abdominal aorta. IMA: Inferior mesenteric artery is patent. Right lower extremity: Mild soft and calcified plaque of the right iliac system. Right common iliac artery measures 13 mm. No dissection. Hypogastric artery is patent. Moderate atherosclerotic changes of the right hypogastric system. Right external iliac artery patent with mild atherosclerotic changes. Irregular intimal contour of the right common femoral artery with luminal narrowing, at the site of recent arterial access. Common femoral artery remains patent throughout its course to the bifurcation. Profunda femoris remains patent, as well as the thigh branches. There is proximal occlusion of the superficial femoral artery. Circumferential calcified plaque of the length of the superficial femoral artery with background of atherosclerotic changes extending through the popliteal artery. No contrast is visualized through the proximal 2/3 of the SFA. Collaterals partially fill the distal SFA and above knee popliteal artery. No contrast identified through the length of the tibial vessels. Baseline calcified disease present through the length of the tibial vessels including predominantly anterior tibial artery and posterior tibial artery. Left lower extremity: Mixed calcified and soft plaque of the left iliac system. Diameter of the left common iliac artery measures 12 mm. No dissection flap. Hypogastric arteries patent with moderate atherosclerotic changes. External iliac artery patent with mild plaque. Common femoral artery patent with mild plaque. Profunda femoris patent as well as the thigh branches. Superficial femoral artery demonstrates mild to moderate calcified and soft plaque with no significant stenosis identified. Popliteal artery appears patent through its length with mild plaque. There is decrease contrast column within the distal popliteal artery, with decrease contrast  filling of the tibial vessels. There does appear to be discontinuous contrast flow of the peroneal artery to the ankle, with questionable filling of the anterior tibial and posterior tibial vessels. Baseline calcified disease of the tibial vessels, predominantly anterior tibial artery and posterior tibial artery. Veins: Unremarkable appearance of the venous system. Review of the MIP images confirms the above findings. NON-VASCULAR Urinary tract: Incomplete visualization of the bilateral kidneys. Likely Bosniak 1 cyst at the inferior left kidney. Excreted contrast within the  urinary bladder. Stomach/Bowel: Unremarkable appearance of visualized small bowel. Unremarkable appearance of the visualized colon. Lymphatic: Multiple lymph nodes in the para-aortic nodal station, none of which are enlarged. Mesenteric: No adenopathy. Reproductive: Prostate diameter not enlarged. Other: Gas within the anterior abdominal soft tissues, potentially secondary to injections. Compression dressing on the right common femoral region. Musculoskeletal: Degenerative changes of the lower lumbar spine. No displaced fracture. IMPRESSION: Occlusion of the right SFA just after the origin, with patent stump. There is minimal collateral filling of the distal SFA and popliteal artery via patent profunda femoris. There is also absent contrast flow within the right-sided tibial vasculature to the ankle. On the left, contrast flow is maintained through the popliteal artery, however, there is decrease contrast within the anterior tibial artery and posterior tibial artery. There appears to be at least partial filling of the peroneal artery to the ankle. This may be related to additional emboli versus baseline vascular disease. Moderate atherosclerotic changes of the bilateral iliac, femoral-popliteal, and tibial vasculature, with calcifications through the length of the right SFA, right popliteal artery, and bilateral tibial arteries. Irregularity of  the intimal surface of the right common femoral artery, which is patent with mild narrowing, at the site of recent femoral arterial sheath access. No evidence of hematoma with compression dressing in place. Signed, Dulcy Fanny. Earleen Newport, DO [facility] Electronically Signed   By: Corrie Mckusick D.O.   On: 04/25/2017 05:06   Nm Myocar Multi W/spect W/wall Motion / Ef  Result Date: 04/18/2017  There was no ST segment deviation noted during stress.  Defect 1: There is a medium defect of severe severity present in the basal inferolateral, mid inferior, mid inferolateral and apical inferior location.  Findings consistent with prior myocardial infarction.  This is a high risk study due to reduced systolic function.  The left ventricular ejection fraction is severely decreased (<30%).  No ischemia.    Dg Chest Port 1 View  Result Date: 04/27/2017 CLINICAL DATA:  Respiratory failure. EXAM: PORTABLE CHEST 1 VIEW COMPARISON:  Radiograph of April 26, 2017. FINDINGS: Stable cardiomegaly. Stable position of endotracheal and nasogastric tubes. No pneumothorax is noted. Increased bibasilar opacities are noted concerning for worsening edema or atelectasis with associated pleural effusions. Bony thorax is unremarkable. IMPRESSION: Stable support apparatus. Increased bibasilar edema or atelectasis is noted with associated pleural effusions. Electronically Signed   By: Marijo Conception, M.D.   On: 04/27/2017 07:54   Dg Chest Port 1 View  Result Date: 04/26/2017 CLINICAL DATA:  Respiratory failure EXAM: PORTABLE CHEST 1 VIEW COMPARISON:  04/25/2017 FINDINGS: Endotracheal tube in good position.  NG tube in the stomach. Interval improvement in vascular congestion and mild edema. Progression of left lower lobe consolidation and effusion. Small right effusion has developed. IMPRESSION: Improvement in mild pulmonary edema. Progression of left lower lobe airspace disease  and left effusion. Electronically Signed   By: Franchot Gallo M.D.   On: 04/26/2017 07:18   Dg Chest Port 1 View  Result Date: 04/25/2017 CLINICAL DATA:  Endotracheal intubation. EXAM: PORTABLE CHEST 1 VIEW COMPARISON:  04/24/2017 FINDINGS: Postoperative changes in the mediastinum. Endotracheal tube with tip measuring 3.5 cm above the carina. Enteric tube tip is off the field of view but below the left hemidiaphragm. Shallow inspiration. Cardiac enlargement. Mild developing pulmonary vascular congestion. Possible early perihilar edema. No blunting of costophrenic angles. No pneumothorax. IMPRESSION: Appliances appear in satisfactory location. Cardiac enlargement with mild developing pulmonary vascular congestion and  possible early perihilar edema. Electronically Signed   By: Lucienne Capers M.D.   On: 04/25/2017 05:40   Dg Chest Port 1 View  Result Date: 04/17/2017 CLINICAL DATA:  Chest pain and shortness of breath for the past 3 days. History of diabetes, previous pulmonary embolism, nonsmoker. EXAM: PORTABLE CHEST 1 VIEW COMPARISON:  None in PACs FINDINGS: The lungs are adequately inflated and clear. There is no pleural effusion or pneumothorax. The cardiac silhouette is enlarged. The patient has undergone previous median sternotomy. There is calcification in the wall of the aortic arch. The pulmonary vascularity is normal. IMPRESSION: Mild enlargement of the cardiac silhouette. No pulmonary vascular congestion, pleural effusion, or pneumonia. Thoracic aortic atherosclerosis. Electronically Signed   By: David  Martinique M.D.   On: 04/17/2017 10:46   Dg Foot Complete Right  Result Date: 04/22/2017 CLINICAL DATA:  Pain after chair rolled over foot 2 weeks prior EXAM: RIGHT FOOT COMPLETE - 3+ VIEW COMPARISON:  None. FINDINGS: Frontal, oblique, and lateral views were obtained. There is a tiny avulsion along the lateral aspect of the distal aspect of the fifth proximal phalanx. There is no other evident  fracture. No dislocation. There is osteoarthritic change in the first MTP joint. There is spurring in the dorsal midfoot. No erosive change. There are foci of arterial vascular calcification. There are small spur posterior and inferior calcaneal spurs. IMPRESSION: Tiny avulsion along the lateral aspect of the distal portion of the fifth proximal phalanx. No other fracture. No dislocation. 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 No erosive change. Areas of arterial vascular calcification most likely are due to diabetes mellitus. Small calcaneal spurs noted. Electronically Signed   By: Lowella Grip III M.D.   On: 04/22/2017 21:54   Ir Percutaneous Art Thrombectomy/infusion Intracranial Inc Diag Angio  Result Date: 04/25/2017 INDICATION: 71 year old male admitted with a history of congestive heart failure. Acute mental status change recognized at approximately mid night, with last known well of 10:30 p.m. with acute code stroke activation. Imaging demonstrates right M1 occlusion corresponding to the patient's dense left-sided symptoms. EXAM: ULTRASOUND GUIDED ACCESS RIGHT COMMON FEMORAL ARTERY CERVICAL AND CEREBRAL ANGIOGRAM MECHANICAL THROMBECTOMY RIGHT MCA OCCLUSION COMPARISON:  CT AND CT ANGIOGRAM IMAGING OF THE SAME DATE MEDICATIONS: None ANESTHESIA/SEDATION: General endotracheal anesthesia CONTRAST:  40 cc Isovue FLUOROSCOPY TIME:  Fluoroscopy Time: 12 minutes 18 seconds (839 mGy). COMPLICATIONS: None TECHNIQUE: Informed written consent was obtained from the patient's family after a thorough discussion of the procedural risks, benefits and alternatives. Specific risks discussed include: Bleeding, infection, contrast reaction, kidney injury/failure, need for further procedure/surgery, arterial injury or dissection, embolization to new territory, intracranial hemorrhage (10-15% risk), neurologic deterioration, cardiopulmonary collapse, death. All questions were addressed. Maximal  Sterile Barrier Technique was utilized including during the procedure including caps, mask, sterile gowns, sterile gloves, sterile drape, hand hygiene and skin antiseptic. A timeout was performed prior to the initiation of the procedure. FINDINGS: Initial: Right common carotid artery:  Normal course caliber and contour. Right external carotid artery: Patent with antegrade flow. Right internal carotid artery: Mildly tortuous right cervical ICA with no significant atherosclerotic changes at the carotid bulb. No significant stenosis. Right MCA: Initial angiogram demonstrates proximal occlusion of the right middle cerebral artery. There is an early downgoing temporal branch which remains patent. Initial flow is TICI 0. Moderate collateral flow into the MCA territory from [facility] and transcranial branches. Right ACA: A 1 segment patent. A 2 segment perfuses the right ACA territory. Cross filling  of the patent anterior communicating artery with the right-sided carotid flow perfusing the left anterior cerebral artery territory. There is significant competing arterial flow from the left anterior cerebral artery, with to and fro flow of the left A1 segment, compatible with adequate collateralization of the left MCA system. Late phase of the right common injection demonstrates partial opacification of the left cervical ICA and proximal MCA. Completion: After single pass of mechanical thrombectomy stent solitaire device, there is restoration of TICI 3 flow of the right MCA territory. Femoral artery angiogram demonstrates patent common femoral artery with appropriate puncture site. Profunda femoris patent with occlusion of the proximal superficial femoral artery. No Doppler signal of the right pedal pulses at the completion of the study. PROCEDURE: The patient is brought to the interventional radiology suite and placed in the supine position on the IR table. The anesthesia team was present for induction of general endotracheal  anesthesia. Anesthesia team initiated a left radial arterial line. After patient preparation was performed and a time-out procedure to confirm patient and procedure and consent, ultrasound survey of the right inguinal region was performed with images stored and sent to PACs. 11 blade scalpel was used to make a small incision. Blunt dissection was performed. A micropuncture needle was used access the right common femoral artery under ultrasound. With excellent arterial blood flow returned, an .018 micro wire was passed through the needle, observed to enter the abdominal aorta under fluoroscopy. The needle was removed, and a micropuncture sheath was placed over the wire. The inner dilator and wire were removed, and an 035 Bentson wire was advanced under fluoroscopy into the abdominal aorta. The sheath was removed and a standard 5 [place] vascular sheath was placed. The dilator was removed and the sheath was flushed. A 23F JB-1 diagnostic catheter was advanced over the wire to the proximal descending thoracic aorta. Wire was then removed. Double flush of the catheter was performed. Catheter was then used to navigate through the innominate artery and into the right common carotid artery. Formal angiogram was performed. Exchange length Rosen wire was then passed through the diagnostic catheter to the distal common carotid artery and into the proximal right internal carotid artery and the diagnostic catheter was removed. The 5 French sheath was removed and exchanged for 8 French 55 centimeter BrightTip sheath. Sheath was flushed and attached to pressurized and heparinized saline bag for constant forward flow. Then an 8 [place], 85 cm Flowgate balloon tip catheter was prepared on the back table with inflation of the balloon with 50/50 concentration of dilute contrast. The balloon catheter was then advanced over the wire, positioned into the internal carotid artery. Copious back flush was performed and the balloon catheter was  attached to heparinized and pressurized saline bag for forward flow. Angiogram was performed for road map guide. Microcatheter system was then introduced through the balloon guide catheter, using a synchro soft 014 wire and a Trevo Provue18 catheter. Microcatheter system was advanced into the internal carotid artery, to the level of the occlusion. The micro wire was then carefully advanced through the occluded segment. Microcatheter was then push through the occluded segment and the wire was removed. Blood was then aspirated through the hub of the microcatheter, and a gentle contrast injection was performed confirming intraluminal position. A rotating hemostatic valve was then attached to the back end of the microcatheter, and a pressurized and heparinized saline bag was attached to the catheter. 4 x 40 solitaire device was then selected. Back flush was achieved  at the rotating hemostatic valve, and then the device was gently advanced through the microcatheter to the distal end. The retriever was then unsheathed by withdrawing the microcatheter under fluoroscopy. Once the retriever was completely unsheathed, a 3 minute time period was observed. The balloon at the balloon tip catheter was then inflated under fluoroscopy for proximal flow arrest. Constant aspiration was then performed at the tip of the balloon guide catheter as the retriever was gently and slowly withdrawn with fluoroscopic observation. Once the retriever was entirely removed from the system, free aspiration was confirmed at the hub of the balloon guide catheter, with free blood return confirmed. The balloon was then deflated, rotating hemostatic valve was reattached, and a control angiogram was performed. Control angiogram was performed at the right common femoral artery puncture site. The 55 centimeter 8 French sheath was removed over roadrunner 035 wire. Abbott Proglide suture mediated closure was attempted, with failure of the suture. Manual  pressure was applied. Gel-Foam was inserted into the soft tissue tract within the incision. Pressure dressing was applied. Patient tolerated the procedure well and remained hemodynamically stable throughout. No complications were encountered and no significant blood loss encountered. IMPRESSION: Status post ultrasound guided access right common femoral artery with cerebral angiogram and mechanical thrombectomy for treatment of acute ELVO of right M1, with restoration of TICI 3 flow after 1 pass. Completion angiogram at the right common femoral artery puncture demonstrates proximal occlusion of the superficial femoral artery, and there are absent pedal pulses on Doppler examination at the right foot. Signed, Dulcy Fanny. Earleen Newport DO [facility] PLAN: Patient will have a noncontrast head CT performed. Patient will have CT angiogram runoff of the abdomen and lower extremities given the absent pedal pulses. Patient will be admitted to ICU maintained on the ventilator. Sheath has been withdrawn from the right common femoral artery, with failure of suture mediated device. Case has been discussed with vascular surgery. Compression dressing should remain for 6 hours. Electronically Signed   By: Corrie Mckusick D.O.   On: 04/25/2017 05:41   Ct Head Code Stroke Wo Contrast  Result Date: 04/25/2017 CLINICAL DATA:  Code stroke.  71 y/o  M; left-sided weakness. EXAM: CT ANGIOGRAPHY HEAD AND NECK CT HEAD CODE STROKE WITHOUT CONTRAST TECHNIQUE: Multidetector CT imaging of the head and neck was performed using the standard protocol during bolus administration of intravenous contrast. Multiplanar CT image reconstructions and MIPs were obtained to evaluate the vascular anatomy. Carotid stenosis measurements (when applicable) are obtained utilizing NASCET criteria, using the distal internal carotid diameter as the denominator. CONTRAST:  50 cc Isovue 370 COMPARISON:  None.  FINDINGS: CT HEAD FINDINGS Brain: Small lucency in the right insula with loss of gray-white differentiation (series 3, image 13). Chronic lacunar infarction within the right superior thalamus. Moderate chronic microvascular ischemic changes of white matter and parenchymal volume loss of the brain. No acute intracranial hemorrhage identified. No hydrocephalus or effacement of basilar cisterns. Vascular: Right M1 hyperdensity. Extensive calcific atherosclerosis of carotid siphons. Skull: Normal. Negative for fracture or focal lesion. Sinuses: Left maxillary sinus mucous retention cyst. Otherwise negative. Orbits: No acute finding. Review of the MIP images confirms the above findings CTA NECK FINDINGS Aortic arch: 4.2 cm ascending aortic aneurysm. Bovine variant anatomy. Right carotid system: No evidence of dissection, stenosis (50% or greater) or occlusion. The mild calcified plaque of the carotid bifurcation and proximal ICA with mild less than 50% stenosis of proximal ICA. Left carotid system: Occlusion  of left common carotid artery to the bifurcation. Reconstitution of the left internal carotid artery via external carotid collaterals with poor downstream enhancement of the left internal carotid artery to the terminus indicating slow flow. Vertebral arteries: Right V1 occlusion with reconstitution of the right vertebral artery at the V2 segment. Calcified plaque of left vertebral artery origin with severe stenosis. The left vertebral artery is otherwise normal in caliber through its course in the neck. Skeleton: The cervical degenerative changes greatest at the C4-5 level were disc disease and a small disc bulge results in mild canal stenosis. Other neck: The mediastinal adenopathy in the lower paratracheal and right upper peritracheal stations, probably due to venous congestion from pulmonary edema. Upper chest: Small right pleural effusion. Smooth interlobular septal thickening compatible with interstitial  pulmonary edema. Review of the MIP images confirms the above findings CTA HEAD FINDINGS Anterior circulation: Extensive calcified plaque of the right carotid siphon with a short segment of severe distal cavernous and second short segment of paraclinoid severe stenosis. Poor enhancement of the distal left cavernous and ophthalmic segments may be due to high-grade stenosis or poor antegrade flow due to left common carotid artery occlusion. Right mid M1 occlusion with poor downstream right MCA collateralization. Patent bilateral anterior cerebral arteries and left middle cerebral artery without significant stenosis. Posterior circulation: No significant stenosis, proximal occlusion, aneurysm, or vascular malformation. Mild proximal basilar stenosis. Short segments of mild stenosis in the bilateral P1 segments. Right V4 segment short segment of mild stenosis secondary to calcified plaque. Venous sinuses: As permitted by contrast timing, patent. Anatomic variants: Small anterior communicating artery. No posterior communicating artery identified, likely hypoplastic or absent. 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 ASPECTS = 9 right MCA infarction. No acute hemorrhage. CT head: 1. Small focus of hypoattenuation within right insula compatible with acute infarct, ASPECTS = 9. 2. Moderate chronic microvascular ischemic changes and moderate parenchymal volume loss of the brain. 3. Small chronic lacunar infarct in right superior thalamus. CTA neck: 1. Left common carotid artery occlusion. Reconstitution of left internal carotid artery from external carotid artery collaterals. Poor enhancement of left internal carotid artery indicating slow flow. 2. No significant stenosis of the right carotid system in the neck. 3. Right V1 occlusion with reconstitution of right vertebral artery at the V2  segment. 4. Severe left vertebral artery origin stenosis secondary to calcified plaque. 5. 4.2 cm aortic aneurysm. Recommend annual imaging followup by CTA or MRA. This recommendation follows 2010 ACCF/AHA/AATS/ACR/ASA/SCA/SCAI/SIR/STS/SVM Guidelines for the Diagnosis and Management of Patients with Thoracic Aortic Disease. Circulation. 2010; 121: F027-X412. 6. Interstitial pulmonary edema. Enlarged mediastinal lymph nodes is likely related to pulmonary edema. CTA head: 1. Right mid M1 occlusion. Poor downstream right MCA collateralization. 2. Poor enhancement of distal left cavernous and ophthalmic segments of ICA may be due to slow flow secondary to common carotid occlusion or underlying high-grade stenosis. 3. Intracranial atherosclerosis with multiple areas of mild stenosis in the anterior and posterior circulation. 4. No additional large vessel occlusion, aneurysm, or evidence for vascular malformation. These results were called by telephone at the time of interpretation on 04/25/2017 at 12:55 am to Dr. Roland Rack , who verbally acknowledged these results. Electronically Signed   By: Kristine Garbe M.D.   On: 04/25/2017 01:14   Disposition   Pt is being discharged home today in good condition.  Follow-up Plans & Appointments  Follow-up Information    [facility] Follow up.   Why:  Call for appointment to follow-up operative procedure to open up scar tissue Contact information: [place] [address] [phone]        Rosalin Hawking, MD. Schedule an appointment as soon as possible for a visit in 6 week(s).   Specialty:  Neurology Contact information: [address] [place] [address] [phone]        Lelon Perla, MD Follow up.   Specialty:  Cardiology Why:  our office will arrange a f/u appointment with Dr. Leroy Libman information: [address] [place]  [address] [phone]          Discharge Instructions    Ambulatory referral to Neurology    Complete by:  As directed    Pt will follow up with Dr. Erlinda Hong at [facility] in about 2 months. Thanks.   Diet - low sodium heart healthy    Complete by:  As directed    Increase activity slowly    Complete by:  As directed       Discharge Medications   Current Discharge Medication List    START taking these medications   Details  acetaminophen (TYLENOL) 325 MG tablet Take 2 tablets (650 mg total) by mouth every 4 (four) hours as needed for headache or mild pain.    heparin 100-0.45 UNIT/ML-% infusion Per pharmacy Qty: 250 mL, Refills: 0    !! insulin aspart (NOVOLOG) 100 UNIT/ML injection Inject 0-5 Units into the skin at bedtime. Qty: 10 mL, Refills: 11    !! insulin aspart (NOVOLOG) 100 UNIT/ML injection Inject 0-9 Units into the skin 3 (three) times daily with meals. Qty: 10 mL, Refills: 11    pantoprazole (PROTONIX) 40 MG tablet Take 1 tablet (40 mg total) by mouth daily. Qty: 30 tablet, Refills: 5    potassium chloride SA (K-DUR,KLOR-CON) 20 MEQ tablet Take 1 tablet (20 mEq total) by mouth 2 (two) times daily. Qty: 60 tablet, Refills: 5    spironolactone (ALDACTONE) 50 MG tablet Take 1 tablet (50 mg total) by mouth daily. Qty: 30 tablet, Refills: 5    warfarin (COUMADIN) 6 MG tablet Take 1 tablet (6 mg total) by mouth one time only at 6 PM. Qty: 30 tablet, Refills: 0     !! - Potential duplicate medications found. Please discuss with provider.    CONTINUE these medications which have CHANGED   Details  aspirin EC 81 MG EC tablet Take 1 tablet (81 mg total) by mouth daily.    furosemide (LASIX) 80 MG tablet Take 1 tablet (80 mg total) by mouth daily. Qty: 30 tablet, Refills: 5    losartan (COZAAR) 50 MG tablet Take 1 tablet (50 mg total) by mouth daily. Qty: 30 tablet, Refills: 5      CONTINUE these medications which have NOT CHANGED   Details  atorvastatin (LIPITOR) 80  MG tablet Take 1 tablet (80 mg total) by mouth daily at 6 PM. Qty: 30 tablet, Refills: 11    feeding supplement, GLUCERNA SHAKE, (GLUCERNA SHAKE) LIQD Take 237 mLs by mouth 2 (two) times daily between meals.    insulin glargine (LANTUS) 100 UNIT/ML injection Inject 0.05 mLs (5 Units total) into the skin at bedtime. Qty: 10 mL, Refills: 11    metFORMIN (GLUCOPHAGE) 1000 MG tablet Take 1 tablet (1,000 mg total) by mouth daily with breakfast. Qty: 30 tablet, Refills:  11    Blood Glucose Monitoring Suppl (TRUE METRIX METER) DEVI 1 kit by Does not apply route 4 (four) times daily. Qty: 1 Device, Refills: 0    glucose blood (TRUE METRIX BLOOD GLUCOSE TEST) test strip Use as instructed Qty: 100 each, Refills: 12    TRUEPLUS LANCETS 28G MISC 28 g by Does not apply route 4 (four) times daily. Qty: 120 each, Refills: 11      STOP taking these medications     acetaminophen (TYLENOL) 650 MG CR tablet      carvedilol (COREG) 3.125 MG tablet      meloxicam (MOBIC) 7.5 MG tablet          Aspirin prescribed at discharge?  Yes High Intensity Statin Prescribed? (Lipitor 40-93m or Crestor 20-430m: Yes Beta Blocker Prescribed? No, soft BP For EF <40%, was ACEI/ARB Prescribed? Yes ADP Receptor Inhibitor Prescribed? (i.e. Plavix etc.-Includes Medically Managed Patients): No:  For EF <40%, Aldosterone Inhibitor Prescribed? Yes Was EF assessed during THIS hospitalization? Yes Was Cardiac Rehab II ordered? (Included Medically managed Patients): Yes   Outstanding Labs/Studies   none  Duration of Discharge Encounter   Greater than 30 minutes including physician time.  Signed, BrLyda JesterA-C 05/05/2017, 4:28 PM  Our office has been notified to schedule patient for post hospital f/u in 3-4 weeks. Please call cardiology prior to discharge from inpatient rehab so that we can get him scheduled in our  Coumadin clinic soon after he is released.   Personally seen and examined. Agree  with above.  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 No embolic source was noted on echocardiogram.   - Continue with transitioned to Lasix 80 by mouth daily. Breathing is improved. 2.5 L out. Lungs are clear. Heart is regular.  - Continue with anticoagulation with warfarin, bridging until therapeutic with heparin IV.  - Nuclear stress test shows extensive inferior and inferolateral scarring. Continue with aggressive medical therapy.  - Creatinine slightly increasing after diuresis. By mouth Lasix now.  - Hyponatremia is a marker for prognosis, heart failure.  OK for rehab stay.   MaCandee FurbishMD

## 2017-05-06 ENCOUNTER — Inpatient Hospital Stay (HOSPITAL_COMMUNITY): Payer: Self-pay | Admitting: Occupational Therapy

## 2017-05-06 ENCOUNTER — Inpatient Hospital Stay (HOSPITAL_COMMUNITY): Payer: Self-pay | Admitting: Physical Therapy

## 2017-05-06 ENCOUNTER — Inpatient Hospital Stay (HOSPITAL_COMMUNITY): Payer: Self-pay

## 2017-05-06 DIAGNOSIS — E871 Hypo-osmolality and hyponatremia: Secondary | ICD-10-CM

## 2017-05-06 DIAGNOSIS — D638 Anemia in other chronic diseases classified elsewhere: Secondary | ICD-10-CM

## 2017-05-06 DIAGNOSIS — I5021 Acute systolic (congestive) heart failure: Secondary | ICD-10-CM

## 2017-05-06 DIAGNOSIS — I10 Essential (primary) hypertension: Secondary | ICD-10-CM

## 2017-05-06 DIAGNOSIS — I63511 Cerebral infarction due to unspecified occlusion or stenosis of right middle cerebral artery: Secondary | ICD-10-CM

## 2017-05-06 DIAGNOSIS — D62 Acute posthemorrhagic anemia: Secondary | ICD-10-CM

## 2017-05-06 DIAGNOSIS — N179 Acute kidney failure, unspecified: Secondary | ICD-10-CM

## 2017-05-06 DIAGNOSIS — E1142 Type 2 diabetes mellitus with diabetic polyneuropathy: Secondary | ICD-10-CM

## 2017-05-06 LAB — HEPARIN LEVEL (UNFRACTIONATED)
HEPARIN UNFRACTIONATED: 0.51 [IU]/mL (ref 0.30–0.70)
Heparin Unfractionated: 0.64 IU/mL (ref 0.30–0.70)

## 2017-05-06 LAB — PROTIME-INR
INR: 1.84
Prothrombin Time: 21.1 seconds — ABNORMAL HIGH (ref 11.4–15.2)

## 2017-05-06 LAB — GLUCOSE, CAPILLARY
GLUCOSE-CAPILLARY: 169 mg/dL — AB (ref 65–99)
GLUCOSE-CAPILLARY: 227 mg/dL — AB (ref 65–99)
Glucose-Capillary: 188 mg/dL — ABNORMAL HIGH (ref 65–99)

## 2017-05-06 MED ORDER — GABAPENTIN 100 MG PO CAPS
100.0000 mg | ORAL_CAPSULE | Freq: Three times a day (TID) | ORAL | Status: DC
  Administered 2017-05-06 – 2017-05-10 (×12): 100 mg via ORAL
  Filled 2017-05-06 (×12): qty 1

## 2017-05-06 MED ORDER — WARFARIN SODIUM 7.5 MG PO TABS
7.5000 mg | ORAL_TABLET | Freq: Once | ORAL | Status: AC
  Administered 2017-05-06: 7.5 mg via ORAL
  Filled 2017-05-06: qty 1

## 2017-05-06 MED FILL — ?SPIRONOLACTONE 50 MG TABLE: 50 | 30 days supply | Qty: 30 | Fill #0

## 2017-05-06 MED FILL — NovoLOG 100 UNIT/ML SOLN: 100 | 28 days supply | Qty: 10 | Fill #0

## 2017-05-06 MED FILL — ?PANTOPRAZOLE SOD DR 40MG: 40 MG | 30 days supply | Qty: 30 | Fill #0

## 2017-05-06 NOTE — Progress Notes (Signed)
Occupational Therapy Session Note  Patient Details  Name: [name] MRN: [id number] Date of Birth: 11/11/1945  Today's Date: 05/06/2017 OT Individual Time: 1400-1503 OT Individual Time Calculation (min): 63 min    Short Term Goals: Week 1:  OT Short Term Goal 1 (Week 1): STG = LTGs due to ELOS  Skilled Therapeutic Interventions/Progress Updates:   Pt completed bathing and dressing sit to stand at the sink from the wheelchair during session.  Therapist utilized interpreter line throughout session, but pt's brother was also present and would occasionally interpret as well.  He was able to transfer from EOB to wheelchair with min guard assist.  Initially pt attempting to wash face by dipping hand in water and splashing it on his face, which at the same time had it splashing excessively on the floor.  Questioned pt if he used a Oncologist at home and he stated yes, however he did not attempt use of washcloth that was placed in the water for him, until cued to do so.  With initial use of the washcloth,  he did not squeeze it out but instead attempted to wash his face and head again, with water excessively dripping on the floor.  Gave cueing to have him squeeze washcloth out and not let it drip.  He was able to then sequence through bathing with min questioning cueing sit to stand.  He was able to complete dressing sit to stand with min assist as well.  Noted pt with greater difficulty with coordination in the left hand compared to the right, but still used it at a diminished level throughout selfcare tasks.  Once finished had pt transfer to the bedside recliner with min guard assist.  Cchair alarm in place and nursing present to administer medications as well. Pt's brother in room for support and supervision also.    Therapy Documentation Precautions:  Precautions Precautions: Fall Precaution Comments: Rt post op shoe - pt refused use.  Restrictions Weight Bearing Restrictions: No  Pain: Pain  Assessment Pain Assessment: 0-10 Pain Score: 7  Pain Type: Acute pain Pain Location: Toe (Comment which one) Pain Descriptors / Indicators: Aching Pain Frequency: Intermittent Pain Onset: On-going Patients Stated Pain Goal: 2 Pain Intervention(s): Medication (See eMAR) (tylenol 650 mg po) ADL: See Function Navigator for Current Functional Status.   Therapy/Group: Individual Therapy  [name] OTR/L 05/06/2017, 4:20 PM

## 2017-05-06 NOTE — Discharge Instructions (Addendum)
Inpatient Rehab Discharge Instructions  [name] Discharge date and time: No discharge date for patient encounter.   Activities/Precautions/ Functional Status: Activity: activity as tolerated Diet: diabetic diet Wound Care: keep wound clean and dry Functional status:  ___ No restrictions     ___ Walk up steps independently ___ 24/7 supervision/assistance   ___ Walk up steps with assistance ___ Intermittent supervision/assistance  ___ Bathe/dress independently ___ Walk with walker     _x__ Bathe/dress with assistance ___ Walk Independently    ___ Shower independently ___ Walk with assistance    ___ Shower with assistance ___ No alcohol     ___ Return to work/school ________     COMMUNITY REFERRALS UPON DISCHARGE:    Home Health:   PT     OT     RN                         Agency:  [facility] Phone: [phone]   Medical Equipment/Items Ordered: wheelchair, cushion, rolling walker                                                     Agency/Supplier:  [facility] @ [phone]     Special Instructions: Home health nurse to check INR 05/15/2017 results to [facility] [phone]  Fax number [phone]   Home health nurse to check BMET Friday, 05/16/2017 results to Dr. [name] [phone] fax number [phone]   My questions have been answered and I understand these instructions. I will adhere to these goals and the provided educational materials after my discharge from the hospital.  Patient/Caregiver Signature _______________________________ Date __________  Clinician Signature _______________________________________ Date __________  Please bring this form and your medication list with you to all your follow-up doctor's appointments. Information on my medicine - Coumadin   (Warfarin)  This medication education was reviewed with me or my healthcare representative as part of my discharge preparation.   Why was Coumadin prescribed for  you? Coumadin was prescribed for you because you have a blood clot or a medical condition that can cause an increased risk of forming blood clots. Blood clots can cause serious health problems by blocking the flow of blood to the heart, lung, or brain. Coumadin can prevent harmful blood clots from forming. As a reminder your indication for Coumadin is:   Stroke Prevention Because Of Atrial Fibrillation  What test will check on my response to Coumadin? While on Coumadin (warfarin) you will need to have an INR test regularly to ensure that your dose is keeping you in the desired range. The INR (international normalized ratio) number is calculated from the result of the laboratory test called prothrombin time (PT).  If an INR APPOINTMENT HAS NOT ALREADY BEEN MADE FOR YOU please schedule an appointment to have this lab work done by your health care provider within 7 days. Your INR goal is usually a number between:  2 to 3 or your provider may give you a more narrow range like 2-2.5.  Ask your health care provider during an office visit what your goal INR is.  What  do you need to  know  About  COUMADIN? Take Coumadin (warfarin) exactly as prescribed by your healthcare provider about the same time each day.  DO NOT stop taking  without talking to the doctor who prescribed the medication.  Stopping without other blood clot prevention medication to take the place of Coumadin may increase your risk of developing a new clot or stroke.  Get refills before you run out.  What do you do if you miss a dose? If you miss a dose, take it as soon as you remember on the same day then continue your regularly scheduled regimen the next day.  Do not take two doses of Coumadin at the same time.  Important Safety Information A possible side effect of Coumadin (Warfarin) is an increased risk of bleeding. You should call your healthcare provider right away if you experience any of the following: ? Bleeding from an injury or  your nose that does not stop. ? Unusual colored urine (red or dark brown) or unusual colored stools (red or black). ? Unusual bruising for unknown reasons. ? A serious fall or if you hit your head (even if there is no bleeding).  Some foods or medicines interact with Coumadin (warfarin) and might alter your response to warfarin. To help avoid this: ? Eat a balanced diet, maintaining a consistent amount of Vitamin K. ? Notify your provider about major diet changes you plan to make. ? Avoid alcohol or limit your intake to 1 drink for women and 2 drinks for men per day. (1 drink is 5 oz. wine, 12 oz. beer, or 1.5 oz. liquor.)  Make sure that ANY health care provider who prescribes medication for you knows that you are taking Coumadin (warfarin).  Also make sure the healthcare provider who is monitoring your Coumadin knows when you have started a new medication including herbals and non-prescription products.  Coumadin (Warfarin)  Major Drug Interactions  Increased Warfarin Effect Decreased Warfarin Effect  Alcohol (large quantities) Antibiotics (esp. Septra/Bactrim, Flagyl, Cipro) Amiodarone (Cordarone) Aspirin (ASA) Cimetidine (Tagamet) Megestrol (Megace) NSAIDs (ibuprofen, naproxen, etc.) Piroxicam (Feldene) Propafenone (Rythmol SR) Propranolol (Inderal) Isoniazid (INH) Posaconazole (Noxafil) Barbiturates (Phenobarbital) Carbamazepine (Tegretol) Chlordiazepoxide (Librium) Cholestyramine (Questran) Griseofulvin Oral Contraceptives Rifampin Sucralfate (Carafate) Vitamin K   Coumadin (Warfarin) Major Herbal Interactions  Increased Warfarin Effect Decreased Warfarin Effect  Garlic Ginseng Ginkgo biloba Coenzyme Q10 Green tea St. Johns wort    Coumadin (Warfarin) FOOD Interactions  Eat a consistent number of servings per week of foods HIGH in Vitamin K (1 serving =  cup)  Collards (cooked, or boiled & drained) Kale (cooked, or boiled & drained) Mustard greens (cooked,  or boiled & drained) Parsley *serving size only =  cup Spinach (cooked, or boiled & drained) Swiss chard (cooked, or boiled & drained) Turnip greens (cooked, or boiled & drained)  Eat a consistent number of servings per week of foods MEDIUM-HIGH in Vitamin K (1 serving = 1 cup)  Asparagus (cooked, or boiled & drained) Broccoli (cooked, boiled & drained, or raw & chopped) Brussel sprouts (cooked, or boiled & drained) *serving size only =  cup Lettuce, raw (green leaf, endive, romaine) Spinach, raw Turnip greens, raw & chopped   These websites have more information on Coumadin (warfarin):  http://www.king-russell.com/; https://www.hines.net/;

## 2017-05-06 NOTE — Progress Notes (Signed)
Physical Therapy Assessment and Plan  Patient Details  Name: [name] MRN: [id number] Date of Birth: September 29, 1945  PT Diagnosis: Abnormality of gait, Coordination disorder, Difficulty walking, Hemiparesis non-dominant and Muscle weakness Rehab Potential: Excellent ELOS: 7-10 days   Today's Date: 05/06/2017 PT Individual Time: 4782-9562 PT Individual Time Calculation (min): 78 min    Problem List:  Patient Active Problem List   Diagnosis Date Noted  . AKI (acute kidney injury) ([place])   . 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 Coronary artery disease involving coronary bypass graft of native heart without angina pectoris   . Type 2 diabetes mellitus with peripheral neuropathy ([name])   . Benign essential HTN   . Hyperlipidemia   . Acute blood loss anemia   . Anemia of chronic disease   . History of medication noncompliance   . Ischemia of right lower extremity   . Endotracheally intubated   . Ischemia of left lower extremity   . Respiratory failure ([place])   . Occlusion of left carotid artery   . Urinary retention   . Ischemic cardiomyopathy   . S/P CABG (coronary artery bypass graft)   . Cerebral embolism with cerebral infarction 04/25/2017  . Acute on chronic systolic heart failure ([place]) 04/24/2017  . Hyponatremia 04/24/2017  . CHF (congestive heart failure) ([place]) 04/17/2017    Past Medical History:  Past Medical History:  Diagnosis Date  . CAD (coronary artery disease) 2007   CABG 2007 in [place]  . CHF (congestive heart failure) ([place])   . Diabetes mellitus without complication ([place])   . Hyperlipidemia   . Hypertension   . Pulmonary embolism ([place])   . Stroke [facility])    Past Surgical History:  Past Surgical History:  Procedure Laterality Date  . CARDIAC SURGERY    . CYSTOSCOPY N/A 04/25/2017   Procedure: CYSTOSCOPY FLEXIBLE;  Surgeon: Conrad [place], MD;  Location: [facility];  Service: Vascular;  Laterality: N/A;  . EMBOLECTOMY Right 04/25/2017   Procedure:  EMBOLECTOMY RIGHT FEMORAL ARTERY;  Surgeon: Conrad [place], MD;  Location: [place];  Service: Vascular;  Laterality: Right;  . IR PERCUTANEOUS ART THROMBECTOMY/INFUSION INTRACRANIAL INC DIAG ANGIO  04/25/2017  . PATCH ANGIOPLASTY Right 04/25/2017   Procedure: PATCH ANGIOPLASTY USING [place];  Surgeon: Conrad [place], MD;  Location: [place];  Service: Vascular;  Laterality: Right;  . PROSTATE SURGERY  2002  . RADIOLOGY WITH ANESTHESIA N/A 04/24/2017   Procedure: RADIOLOGY WITH ANESTHESIA Code Stroke;  Surgeon: Corrie Mckusick, DO;  Location: [place];  Service: Anesthesiology;  Laterality: N/A;    Assessment & Plan Clinical Impression: Patient is a 71 y.o. right handedlimited English-speaking malewith history of CAD/CABG 2007 in [place], diabetes mellitus, systolic congestive heart failure, hypertension, CVA as well as questionable medical compliance. Patient lives with family. History taken from daughter, who also translates. Independent with a cane prior to admission. One level home with 4 steps to entry. Presented 04/24/2017 with increasing shortness of breath as well as ischemic right leg. Patient did require intubation. Chest x-ray cardiac enlargement without failure. CT of the head showed small focus of hypoattenuation within the right insula compatible with acute infarction. 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 CT angiogram showed occlusion of the right SFA just after the origin. Underwent right femoral-popliteal thromboembolectomy, right below the knee popliteal artery endarterectomy 04/25/2017 per Dr. Bridgett Larsson. Postoperatively a Foley catheter tube was placed with findings of  bladder neck contracture urology consulted underwent cystoscopy for placement of Foley tube. Postoperatively noted left-sided weakness with neurology consulted and MRI showed faint ischemic changes involving the right insula out and basal ganglia.  Echocardiogram with ejection fraction of 25% diffuse hypokinesis. No cardiac source of emboli. Acute on chronic anemia 9.6-12.0. Patient currently maintained on heparin as well as Coumadin therapy.Tolerating a regular diet. Physical therapy evaluation completed and ongoing with recommendations of physical medicine rehabilitation consult.  Patient transferred to CIR on 05/05/2017 .   Patient currently requires min with mobility secondary to muscle weakness, impaired timing and sequencing, unbalanced muscle activation, decreased coordination and decreased motor planning and decreased sitting balance, decreased standing balance, hemiplegia and decreased balance strategies.  Prior to hospitalization, patient was independent  with mobility and lived with Daughter in a House home.  Home access is 3Stairs to enter.  Patient will benefit from skilled PT intervention to maximize safe functional mobility and minimize fall risk for planned discharge home with intermittent assist.  Anticipate patient will benefit from follow up [facility] at discharge.  PT - End of Session Activity Tolerance: Tolerates 30+ min activity with multiple rests Endurance Deficit: No PT Assessment Rehab Potential (ACUTE/IP ONLY): Excellent PT Patient demonstrates impairments in the following area(s): Balance;Endurance;Motor PT Transfers Functional Problem(s): Bed Mobility;Bed to Chair;Car;Furniture;Floor PT Locomotion Functional Problem(s): Ambulation PT Plan PT Intensity: Minimum of 1-2 x/day ,45 to 90 minutes PT Frequency: 5 out of 7 days PT Duration Estimated Length of Stay: 7-10 days PT Treatment/Interventions: Ambulation/gait training;Balance/vestibular training;Community reintegration;Discharge planning;Disease management/prevention;DME/adaptive equipment instruction;Functional electrical stimulation;Functional mobility training;Neuromuscular re-education;Pain management;Patient/family education;Psychosocial  support;Splinting/orthotics;Stair training;Therapeutic Activities;Therapeutic Exercise;UE/LE Strength taining/ROM;UE/LE Coordination activities PT Transfers Anticipated Outcome(s): mod I  PT Locomotion Anticipated Outcome(s): ambulatory at mod I level household distances and supervision community distances PT Recommendation Follow Up Recommendations: Home health PT Patient destination: Home Equipment Recommended: To be determined Equipment Details: has cane at home  Skilled Therapeutic Intervention Pt seen for initial evaluation and treatment including functional mobility training with reinforcement of sit<>stand techniques. Ambulation performed using rw and min assistance. Cues needed during session intermittently to attend to Lt side. Attempted w/c propulsion but pt having difficulty with advancing chair. Repeated cues needed to use Lt UE. Daughter present and supportive throughout session. Family reports that the pt is from [place] and has a spouse and children there. He is planning to stay with daughter following stay [facility]. Following session, pt returned to room. Sitting in recliner with daughter present and all needs in reach.   PT Evaluation Precautions/Restrictions Precautions Precautions: Fall Precaution Comments: Rt post op shoe - pt refused use.  Restrictions Weight Bearing Restrictions: No General   Vital Signs Pain Pain Assessment Pain Assessment: No/denies pain Home Living/Prior Functioning Home Living Available Help at Discharge: Family;Available 24 hours/day Type of Home: House Home Access: Stairs to enter [employer] of Steps: 3 Entrance Stairs-Rails: None Home Layout: One level Bathroom Shower/Tub: Chiropodist: Standard  Lives With: Daughter Prior Function Level of Independence: Independent with basic ADLs;Independent with gait  Able to Take Stairs?: Yes Vocation: Retired Comments: pt likes to be active, avid  walker Vision/Perception  Vision - Assessment Eye Alignment: Within Functional Limits Ocular Range of Motion: Within Functional Limits Alignment/Gaze Preference: Within Defined Limits Tracking/Visual Pursuits: Decreased smoothness of horizontal tracking Saccades: Within functional limits Perception Perception: Impaired (mild Lt inattention during functional mobility)  Cognition Overall Cognitive Status: Difficult to assess (language barrier) Arousal/Alertness: Awake/alert Orientation Level: Oriented X4 Memory: Appears intact Awareness:  Appears intact Problem Solving: Appears intact Safety/Judgment: Appears intact Sensation Sensation Light Touch: Appears Intact Proprioception: Impaired by gross assessment (mild impairment on Lt UE) Coordination Gross Motor Movements are Fluid and Coordinated: No Fine Motor Movements are Fluid and Coordinated: No Finger Nose Finger Test: decreased coordination with LUE, mild intention tremor Heel Shin Test: decreased accuracy and control with LLE Motor  Motor Motor: Hemiplegia Motor - Skilled Clinical Observations: decreased strenght and coordination through LLE   Balance Balance Balance Assessed: Yes Static Sitting Balance Static Sitting - Balance Support: No upper extremity supported Static Sitting - Level of Assistance: 5: Stand by assistance Dynamic Sitting Balance Dynamic Sitting - Balance Support: No upper extremity supported Dynamic Sitting - Level of Assistance: 5: Stand by assistance Static Standing Balance Static Standing - Balance Support: Bilateral upper extremity supported Static Standing - Level of Assistance: 5: Stand by assistance Dynamic Standing Balance Dynamic Standing - Balance Support: Bilateral upper extremity supported Dynamic Standing - Level of Assistance: 4: Min assist Extremity Assessment  RUE Assessment RUE Assessment: Within Functional Limits LUE Assessment LUE Assessment: Exceptions to [facility] (strength  grossly 4/5) RLE Assessment RLE Assessment: Within Functional Limits LLE Assessment LLE Assessment: Exceptions to [facility] LLE Strength LLE Overall Strength Comments: 3+ to 4-/5 strength through Lt ankle dorsiflexion, plantarflexion, knee extension, knee flexion.    See Function Navigator for Current Functional Status.   Refer to Care Plan for Long Term Goals  Recommendations for other services: None   Discharge Criteria: Patient will be discharged from PT if patient refuses treatment 3 consecutive times without medical reason, if treatment goals not met, if there is a change in medical status, if patient makes no progress towards goals or if patient is discharged from hospital.  The above assessment, treatment plan, treatment alternatives and goals were discussed and mutually agreed upon: by patient and by family  Linard Millers, PT 05/06/2017, 3:02 PM

## 2017-05-06 NOTE — Progress Notes (Signed)
Initial Nutrition Assessment  DOCUMENTATION CODES:    Not applicable  INTERVENTION:  Continue Premier Protein po BID, each supplement provides 160 kcal and 30 grams of protein.   Encourage adequate PO intake.  Family to bring in food from home.  NUTRITION DIAGNOSIS:   Inadequate oral intake related to poor appetite as evidenced by per patient/family report.  GOAL:   Patient will meet greater than or equal to 90% of their needs  MONITOR:   PO intake, Supplement acceptance, Labs, Weight trends, Skin, I & O's  REASON FOR ASSESSMENT:   Malnutrition Screening Tool    ASSESSMENT:   71 y.o. right handed limited English-speaking male with history of CAD/CABG 2007 in [place], diabetes mellitus, systolic congestive heart failure, hypertension, CVA. Presented 04/24/2017 with increasing shortness of breath as well as ischemic right leg. CT of the head showed small focus of hypoattenuation within the right insula compatible with acute infarction. 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 CT angiogram showed occlusion of the right SFA just after the origin. Underwent right femoral-popliteal thromboembolectomy, right below the knee popliteal artery endarterectomy 04/25/2017.   Meal completion has been 50-60%. Daughter at bedside has brought in food from home for pt to consume. Daughter reports pt with a decreased appetite since admission, however pt reports appetite and po has been increasing. Daughter reports pt was eating well prior to admission with no other difficulties. Pt currently has Premier protein shake ordered and has been consuming them. Pt prefers vanilla flavor or chocolate flavor. RD to make note. Daughter encouraged to bring in food from home to aid in pt po intake.   Pt with no observed significant fat or muscle mass loss.   Labs and medications reviewed.   Diet Order:  Diet heart healthy/carb  modified Room service appropriate? Yes; Fluid consistency: Thin  Skin:   (Incision on R leg)  Last BM:  9/16  Height:   Ht Readings from Last 1 Encounters:  05/05/17 6' (1.829 m)    Weight:   Wt Readings from Last 1 Encounters:  05/06/17 174 lb 11.2 oz (79.2 kg)    Ideal Body Weight:  80.9 kg  BMI:  Body mass index is 23.69 kg/m.  Estimated Nutritional Needs:   Kcal:  2000-2200  Protein:  90-100 grams  Fluid:  >/= 2 L/day  EDUCATION NEEDS:   Education needs addressed  Roslyn Smiling, MS, RD, LDN Pager # [phone] After hours/ weekend pager # [phone]

## 2017-05-06 NOTE — Progress Notes (Signed)
Meredith Staggers, MD Physician Signed Physical Medicine and Rehabilitation  Consult Note Date of Service: 04/29/2017 12:43 PM  Related encounter: ED to Hosp-Admission (Discharged) from 04/24/2017 in [place] [facility]     Expand All Collapse All   _0 Hide copied text _1 Hover for attribution information      Physical Medicine and Rehabilitation Consult Reason for Consult: Left side weakness Referring Physician: Dr. Marlou Porch   HPI: [name] is a 71 y.o. right handed limited English-speaking male with history of CAD/CABG 2007 in [place], diabetes mellitus, systolic congestive heart failure, hypertension, CVA. Patient lives with family. Independent with a cane prior to admission. One level home with 4 steps to entry. Presented 04/24/2017 with increasing shortness of breath as well as ischemic right leg. Patient did require intubation. Chest x-ray cardiac enlargement without failure. CT of the head showed small focus of hypoattenuation within the right insula compatible with acute infarction. 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 CT angiogram showed occlusion of the right SFA just after the origin. Underwent right femoral-popliteal thromboembolectomy, right below the knee popliteal artery endarterectomy 04/25/2017 per Dr. Bridgett Larsson. Postoperatively a Foley catheter tube was placed with findings of bladder neck contracture urology consulted underwent cystoscopy for placement of Foley tube. Postoperatively noted left-sided weakness with neurology consulted and MRI showed faint ischemic changes involving the right insula out and basal ganglia. Echocardiogram with ejection fraction of 25% diffuse hypokinesis. No cardiac source of emboli. Acute on chronic anemia 9.6-10.7. Patient currently maintained on heparin as well as Coumadin therapy. Physical therapy evaluation completed and ongoing with recommendations of  physical medicine rehabilitation consult.   Review of Systems  Unable to perform ROS: Language  All other systems reviewed and are negative.      Past Medical History:  Diagnosis Date  . CAD (coronary artery disease) 2007   CABG 2007 in [place]  . CHF (congestive heart failure) ([place])   . Diabetes mellitus without complication ([place])   . Hyperlipidemia   . Hypertension   . Pulmonary embolism ([place])   . Stroke [facility])         Past Surgical History:  Procedure Laterality Date  . CARDIAC SURGERY    . CYSTOSCOPY N/A 04/25/2017   Procedure: CYSTOSCOPY FLEXIBLE;  Surgeon: Conrad Rawlings, MD;  Location: [place];  Service: Vascular;  Laterality: N/A;  . EMBOLECTOMY Right 04/25/2017   Procedure: EMBOLECTOMY RIGHT FEMORAL ARTERY;  Surgeon: Conrad Ledyard, MD;  Location: [place];  Service: Vascular;  Laterality: Right;  . IR PERCUTANEOUS ART THROMBECTOMY/INFUSION INTRACRANIAL INC DIAG ANGIO  04/25/2017  . PATCH ANGIOPLASTY Right 04/25/2017   Procedure: PATCH ANGIOPLASTY USING [place];  Surgeon: Conrad [place], MD;  Location: [place];  Service: Vascular;  Laterality: Right;  . PROSTATE SURGERY  2002  . RADIOLOGY WITH ANESTHESIA N/A 04/24/2017   Procedure: RADIOLOGY WITH ANESTHESIA Code Stroke;  Surgeon: Corrie Mckusick, DO;  Location: [place];  Service: Anesthesiology;  Laterality: N/A;        Family History  Problem Relation Age of Onset  . Hypertension Mother   . Hypertension Sister    Social History:  reports that he has never smoked. He has never used smokeless tobacco. He reports that he does not drink alcohol or use drugs. Allergies: No Known Allergies       Medications Prior to Admission  Medication Sig Dispense Refill  . acetaminophen (TYLENOL) 650 MG CR tablet  Take 650 mg by mouth at bedtime. FOR LEG PAIN    . aspirin EC 325 MG tablet Take 1 tablet (325 mg total) by mouth daily. 30 tablet 11  . atorvastatin (LIPITOR) 80 MG tablet Take 1 tablet (80 mg total) by  mouth daily at 6 PM. 30 tablet 11  . carvedilol (COREG) 3.125 MG tablet Take 1 tablet (3.125 mg total) by mouth 2 (two) times daily with a meal. 60 tablet 11  . feeding supplement, GLUCERNA SHAKE, (GLUCERNA SHAKE) LIQD Take 237 mLs by mouth 2 (two) times daily between meals.    . furosemide (LASIX) 20 MG tablet Take 1 tablet (20 mg total) by mouth daily. 30 tablet 6  . insulin glargine (LANTUS) 100 UNIT/ML injection Inject 0.05 mLs (5 Units total) into the skin at bedtime. 10 mL 11  . losartan (COZAAR) 25 MG tablet Take 1 tablet (25 mg total) by mouth daily. 30 tablet 11  . meloxicam (MOBIC) 7.5 MG tablet Take 2 tablets (15 mg total) by mouth daily. (Patient taking differently: Take 7.5 mg by mouth 2 (two) times daily. ) 30 tablet 0  . metFORMIN (GLUCOPHAGE) 1000 MG tablet Take 1 tablet (1,000 mg total) by mouth daily with breakfast. 30 tablet 11  . Blood Glucose Monitoring Suppl (TRUE METRIX METER) DEVI 1 kit by Does not apply route 4 (four) times daily. 1 Device 0  . glucose blood (TRUE METRIX BLOOD GLUCOSE TEST) test strip Use as instructed 100 each 12  . TRUEPLUS LANCETS 28G MISC 28 g by Does not apply route 4 (four) times daily. 120 each 11    Home: Home Living Family/patient expects to be discharged to:: Private residence Living Arrangements: Children Available Help at Discharge: Family, Available 24 hours/day Type of Home: House Home Access: Stairs to enter Technical brewer of Steps: 4 Home Layout: One level Bathroom Shower/Tub: Chiropodist: Standard Home Equipment: Cane - single point  Functional History: Prior Function Level of Independence: Independent with assistive device(s) Comments: used SPC for ambulation Functional Status:  Mobility: Bed Mobility Overal bed mobility: Needs Assistance Bed Mobility: Rolling, Sidelying to Sit Rolling: Min assist Sidelying to sit: Mod assist, +2 for physical assistance, HOB elevated Supine to sit: Mod  assist, +2 for physical assistance, HOB elevated Sit to supine: Mod assist, +2 for physical assistance, HOB elevated General bed mobility comments: pt OOB in chair upon arrival Transfers Overall transfer level: Needs assistance Equipment used: Rolling walker (2 wheeled) Transfers: Sit to/from Stand Sit to Stand: Min assist General transfer comment: assist to power up and gain balance upon standing  Ambulation/Gait Ambulation/Gait assistance: Mod assist, +2 safety/equipment (+2 for chair follow) Ambulation Distance (Feet):  (9f then 126f Assistive device: Rolling walker (2 wheeled) Gait Pattern/deviations: Decreased step length - left, Step-to pattern, Step-through pattern, Decreased dorsiflexion - left, Trunk flexed General Gait Details: multimodal cues for posture, weight shifting, and tactile cues for L LE advancement and vc for heel strike; pt with difficulty advancing L LE especially when becoming fatigued and required seated rest break Gait velocity: decreased Gait velocity interpretation: Below normal speed for age/gender  ADL: ADL Overall ADL's : Needs assistance/impaired Grooming: Minimal assistance, Sitting Grooming Details (indicate cue type and reason): Min assist for sitting balance. VC's to initiate grooming tasks.  Upper Body Dressing : Moderate assistance Upper Body Dressing Details (indicate cue type and reason): To change gown due to bed wet on arrival.  Lower Body Dressing: Total assistance Toilet Transfer: Minimal assistance, Moderate assistance, +  2 for physical assistance Toilet Transfer Details (indicate cue type and reason): Able to power up into standing with min assist +2 and requiring mod assist +2 to maintain balance. Able to ambulate pushing recliner for UE support with min assist +2. Functional mobility during ADLs: Moderate assistance, +2 for physical assistance General ADL Comments: Pt highly motivated to participate in ADL.    Cognition: Cognition Overall Cognitive Status: Difficult to assess Orientation Level: Oriented X4 Cognition Arousal/Alertness: Awake/alert Behavior During Therapy: WFL for tasks assessed/performed Overall Cognitive Status: Difficult to assess Area of Impairment: Attention, Problem solving Current Attention Level: Selective Following Commands: Follows one step commands consistently Problem Solving: Requires verbal cues General Comments: daughter translated; pt a little impulsive with ambulating however pt does appear to understand his deficits Difficult to assess due to: Non-English speaking  Blood pressure 116/65, pulse 80, temperature 98.2 F (36.8 C), temperature source Oral, resp. rate (!) 26, height 6' (1.829 m), weight 90 kg (198 lb 6.6 oz), SpO2 98 %. Physical Exam  HENT:  Head: Normocephalic.  Eyes: EOM are normal.  Neck: Normal range of motion. Neck supple. No thyromegaly present.  Cardiovascular:  Cardiac rate controlled  Respiratory: Effort normal and breath sounds normal. No respiratory distress.  GI: Soft. Bowel sounds are normal. He exhibits distension.  Neurological: He is alert.  Patient makes eye contact with examiner. Limited English-speaking. Follows simple commands. Mild left sided weakness. RUE 5/5. LUE 4/5 prox to distal. RLE slightly limited by incision/pain. LLE: 4/5 prox to distal. Senses pain in all 4's.   Skin: Skin is warm and dry.    Lab Results Last 24 Hours       Results for orders placed or performed during the hospital encounter of 04/24/17 (from the past 24 hour(s))  Glucose, capillary     Status: Abnormal   Collection Time: 04/28/17  4:56 PM  Result Value Ref Range   Glucose-Capillary 212 (H) 65 - 99 mg/dL   Comment 1 Notify RN    Comment 2 Document in Chart   Heparin level (unfractionated)     Status: Abnormal   Collection Time: 04/28/17  5:54 PM  Result Value Ref Range   Heparin Unfractionated 0.28 (L) 0.30 - 0.70 IU/mL   Glucose, capillary     Status: Abnormal   Collection Time: 04/28/17  7:21 PM  Result Value Ref Range   Glucose-Capillary 223 (H) 65 - 99 mg/dL  Glucose, capillary     Status: Abnormal   Collection Time: 04/28/17 11:07 PM  Result Value Ref Range   Glucose-Capillary 185 (H) 65 - 99 mg/dL  Basic metabolic panel     Status: Abnormal   Collection Time: 04/29/17  1:32 AM  Result Value Ref Range   Sodium 134 (L) 135 - 145 mmol/L   Potassium 3.5 3.5 - 5.1 mmol/L   Chloride 104 101 - 111 mmol/L   CO2 20 (L) 22 - 32 mmol/L   Glucose, Bld 191 (H) 65 - 99 mg/dL   BUN 20 6 - 20 mg/dL   Creatinine, Ser 0.97 0.61 - 1.24 mg/dL   Calcium 8.9 8.9 - 10.3 mg/dL   GFR calc non Af Amer >60 >60 mL/min   GFR calc Af Amer >60 >60 mL/min   Anion gap 10 5 - 15  CBC     Status: Abnormal   Collection Time: 04/29/17  1:32 AM  Result Value Ref Range   WBC 10.0 4.0 - 10.5 K/uL   RBC 4.32 4.22 - 5.81  MIL/uL   Hemoglobin 10.7 (L) 13.0 - 17.0 g/dL   HCT 30.0 (L) 39.0 - 52.0 %   MCV 69.4 (L) 78.0 - 100.0 fL   MCH 24.8 (L) 26.0 - 34.0 pg   MCHC 35.7 30.0 - 36.0 g/dL   RDW 14.3 11.5 - 15.5 %   Platelets 200 150 - 400 K/uL  Heparin level (unfractionated)     Status: None   Collection Time: 04/29/17  1:32 AM  Result Value Ref Range   Heparin Unfractionated 0.36 0.30 - 0.70 IU/mL  Protime-INR     Status: Abnormal   Collection Time: 04/29/17  1:32 AM  Result Value Ref Range   Prothrombin Time 15.7 (H) 11.4 - 15.2 seconds   INR 1.26   Glucose, capillary     Status: Abnormal   Collection Time: 04/29/17  3:07 AM  Result Value Ref Range   Glucose-Capillary 156 (H) 65 - 99 mg/dL  Glucose, capillary     Status: Abnormal   Collection Time: 04/29/17  7:55 AM  Result Value Ref Range   Glucose-Capillary 142 (H) 65 - 99 mg/dL   Comment 1 Document in Chart   Glucose, capillary     Status: Abnormal   Collection Time: 04/29/17 11:35 AM  Result Value Ref Range    Glucose-Capillary 160 (H) 65 - 99 mg/dL   Comment 1 Document in Chart       Imaging Results (Last 48 hours)  Mr Brain Wo Contrast  Result Date: 04/28/2017 CLINICAL DATA:  Status post mechanical thrombectomy RIGHT M1 occlusion. Focal neurological deficit, suspect stroke. EXAM: MRI HEAD WITHOUT CONTRAST TECHNIQUE: Multiplanar, multiecho pulse sequences of the brain and surrounding structures were obtained without intravenous contrast. COMPARISON:  CT HEAD April 26, 2017 and MRI of the head April 25, 2017 FINDINGS: BRAIN: New punctate focus reduced diffusion LEFT frontal cortex (axial diffusion weighted imaging 44/100) Reduced diffusion at RIGHT insula and posterior lenticulostriate nucleus unchanged distribution for prior imaging. Punctate focus reduced diffusion LEFT frontal and LEFT occipital lobes are unchanged. No further propagation. Normalizing ADC values. No new infarcts. No susceptibility artifact to suggest hemorrhage. A few scattered supratentorial chronic micro hemorrhages associated with chronic hypertension. Old RIGHT thalamus lacunar infarct with mineralization. Patchy to confluent supratentorial white matter FLAIR T2 hyperintensities. Old small cerebellar infarcts. Ventricles and sulci are overall normal for patient's age. No midline shift, mass effect or masses. No abnormal extra-axial fluid collections. VASCULAR: Normal major intracranial vascular flow voids present at skull base. SKULL AND UPPER CERVICAL SPINE: No abnormal sellar expansion. No suspicious calvarial bone marrow signal. Craniocervical junction maintained. SINUSES/ORBITS: LEFT maxillary mucosal retention cyst. Trace paranasal sinus mucosal thickening. Trace LEFT mastoid effusion. The included ocular globes and orbital contents are non-suspicious. OTHER: None. IMPRESSION: 1. New, acute punctate LEFT frontal lobe infarct. 2. Evolving acute to subacute RIGHT MCA territory and punctate LEFT frontal and occipital infarcts  without further propagation, new infarcts or hemorrhagic conversion. 3. 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 Old RIGHT thalamus and small cerebellar infarcts. Electronically Signed   By: Elon Alas M.D.   On: 04/28/2017 23:09     Assessment/Plan: Diagnosis: right insular infarct, right lower ext thromboembolectomy/edarterectomy for ischemic occlusion 1. Does the need for close, 24 hr/day medical supervision in concert with the patient's rehab needs make it unreasonable for this patient to be served in a less intensive setting? Yes 2. Co-Morbidities requiring supervision/potential complications: chf, wound care, pain mgt 3. Due to bladder management, bowel management, safety, skin/wound  care, disease management, medication administration, pain management and patient education, does the patient require 24 hr/day rehab nursing? Yes 4. Does the patient require coordinated care of a physician, rehab nurse, PT (1-2 hrs/day, 5 days/week) and OT (1-2 hrs/day, 5 days/week) to address physical and functional deficits in the context of the above medical diagnosis(es)? Yes Addressing deficits in the following areas: balance, endurance, locomotion, strength, transferring, bowel/bladder control, bathing, dressing, feeding, grooming, toileting and psychosocial support 5. Can the patient actively participate in an intensive therapy program of at least 3 hrs of therapy per day at least 5 days per week? Yes 6. The potential for patient to make measurable gains while on inpatient rehab is excellent 7. Anticipated functional outcomes upon discharge from inpatient rehab are modified independent  with PT, modified independent and supervision with OT, n/a with SLP. 8. Estimated rehab length of stay to reach the above functional goals is: 8-11 days 9. Anticipated D/C setting: Home 10. Anticipated post D/C treatments: HH therapy and Outpatient therapy 11. Overall Rehab/Functional Prognosis:  excellent  RECOMMENDATIONS: This patient's condition is appropriate for continued rehabilitative care in the following setting: CIR Patient has agreed to participate in recommended program. Yes Note that insurance prior authorization may be required for reimbursement for recommended care.  Comment: Rehab Admissions Coordinator to follow up.  Thanks,  Meredith Staggers, MD, Mellody Drown    Cathlyn Parsons., PA-C 04/29/2017    Revision History                        Routing History

## 2017-05-06 NOTE — Progress Notes (Signed)
ANTICOAGULATION CONSULT NOTE - Follow up Consult  Pharmacy Consult for Heparin and Warfarin Indication: stroke and SFA thrombus  No Known Allergies  Patient Measurements: Height: 6' (182.9 cm) Weight: 174 lb 11.2 oz (79.2 kg) IBW/kg (Calculated) : 77.6 Heparin Dosing Weight: 87.7kg  Vital Signs: Temp: 98.8 F (37.1 C) (09/18 0357) Temp Source: Oral (09/18 0357) BP: 108/60 (09/18 0357) Pulse Rate: 56 (09/18 0357)  Labs:  Recent Labs  05/04/17 0413 05/05/17 0433 05/05/17 1928 05/06/17 0803  HGB 11.9* 12.0* 12.5*  --   HCT 32.9* 33.3* 34.4*  --   PLT 265 282 274  --   LABPROT 20.2* 21.7*  --  21.1*  INR 1.74 1.91  --  1.84  HEPARINUNFRC 0.56 0.53  --  0.64  CREATININE 1.25* 1.37* 1.39*  --     Estimated Creatinine Clearance: 54.3 mL/min (A) (by C-G formula based on SCr of 1.39 mg/dL (H)).  Medications:  Heparin @ 1400 units/hr  Assessment: 71 yom with recent acute stroke and subsequent SFA thrombus. Now on heparin bridge to coumadin. Heparin level 0.64 slightly above goal, INR 1.84  9/10 MRI without hemorrhagic conversion  Goal of Therapy:  INR 2-3 Heparin level 0.3-0.5 units/ml Monitor platelets by anticoagulation protocol: Yes   Plan:  - Decrease heparin rate to 1050 units/hr - f/u heparin level at 2000 - Warfarin 7.5mg  tonight - Daily heparin level, INR, CBC   Bayard Hugger, PharmD, BCPS  Clinical Pharmacist  Pager: [phone]    05/06/2017 1:05 PM

## 2017-05-06 NOTE — Progress Notes (Signed)
ANTICOAGULATION CONSULT NOTE - Follow up Consult  Pharmacy Consult for Heparin and Warfarin Indication: stroke and SFA thrombus  No Known Allergies  Patient Measurements: Height: 6' (182.9 cm) Weight: 174 lb 11.2 oz (79.2 kg) IBW/kg (Calculated) : 77.6 Heparin Dosing Weight: 87.7kg  Vital Signs: Temp: 99.2 F (37.3 C) (09/18 1646) Temp Source: Oral (09/18 1646) BP: 105/66 (09/18 1837) Pulse Rate: 103 (09/18 1837)  Labs:  Recent Labs  05/04/17 0413 05/05/17 0433 05/05/17 1928 05/06/17 0803 05/06/17 2032  HGB 11.9* 12.0* 12.5*  --   --   HCT 32.9* 33.3* 34.4*  --   --   PLT 265 282 274  --   --   LABPROT 20.2* 21.7*  --  21.1*  --   INR 1.74 1.91  --  1.84  --   HEPARINUNFRC 0.56 0.53  --  0.64 0.51  CREATININE 1.25* 1.37* 1.39*  --   --     Estimated Creatinine Clearance: 54.3 mL/min (A) (by C-G formula based on SCr of 1.39 mg/dL (H)).  Medications:  Heparin @ 1400 units/hr  Assessment: 71 yom with recent acute stroke and subsequent SFA thrombus. Now on heparin bridge to coumadin. Heparin level this evening remains slightly SUPRAtherapeutic (HL 0.51, goal of 0.3-0.5). Will reduce slightly and f/u with AM labs.   Goal of Therapy:  Heparin level 0.3-0.5 units/ml Monitor platelets by anticoagulation protocol: Yes   Plan:  1. Decrease Heparin drip rate slightly to 1000 units/hr (10 ml/hr) 2. Will continue to monitor for any signs/symptoms of bleeding and will follow up with heparin level in the a.m.   Thank you for allowing pharmacy to be a part of this patient's care.  Georgina Pillion, PharmD, BCPS Clinical Pharmacist Pager: [phone] 05/06/2017 9:45 PM

## 2017-05-06 NOTE — Consult Note (Addendum)
WOC Nurse wound re-consult note Refer to previous progress note on 9/13.   Reason for Consult: Consult requested for right 3rd toe.  Pt states he injured his toe a few weeks ago by hitting it on a chair.  He had an x-ray which indicated an avulsion, osteoarthritic changes, and spurs.  Foot was evaluated in the ER at that time and no further treatment was ordered except a blue post-op shoe. Wound type: There is currently no open wound.  Pt has a dry scabbed area .5X.5cm between the 3rd and 4th toes where the injury previously occurred which removes easily.  There is another .2X.2cm scab on the plantar area which also peels off, revealing intact skin to both sites. He previously  requested something to help with neuropathic pain, "which comes from the inside and pain is very bad."  This information was obtained from a family member translating at the bedside during the last consult.   Generalized edema to right toes; there no open wound, drainage, or fluctuance when probed with a swab. Dressing procedure/placement/frequency: Bactroban was prevously ordered, there is no indication for further topical treatment at this time. Family member who translated last time is not present to explain to the patient. Primary team: Patient states he is having a large amt neuropathic pain to his feet.  Please consider ordering medication if this is appropriate.   Please re-consult if further assistance is needed.  Thank-you,  Cammie Mcgee MSN, RN, CWOCN, [place], CNS [phone]

## 2017-05-06 NOTE — Evaluation (Signed)
Occupational Therapy Assessment and Plan  Patient Details  Name: [name] MRN: [id number] Date of Birth: 11/04/1945  OT Diagnosis: apraxia, disturbance of vision, hemiplegia affecting non-dominant side and muscle weakness (generalized) Rehab Potential: Rehab Potential (ACUTE ONLY): Good ELOS: 7-10 days   Today's Date: 05/06/2017 OT Individual Time: 9892-1194 OT Individual Time Calculation (min): 47 min     Problem List:  Patient Active Problem List   Diagnosis Date Noted  . AKI (acute kidney injury) ([place])   . 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 Coronary artery disease involving coronary bypass graft of native heart without angina pectoris   . Type 2 diabetes mellitus with peripheral neuropathy ([name])   . Benign essential HTN   . Hyperlipidemia   . Acute blood loss anemia   . Anemia of chronic disease   . History of medication noncompliance   . Ischemia of right lower extremity   . Endotracheally intubated   . Ischemia of left lower extremity   . Respiratory failure ([place])   . Occlusion of left carotid artery   . Urinary retention   . Ischemic cardiomyopathy   . S/P CABG (coronary artery bypass graft)   . Cerebral embolism with cerebral infarction 04/25/2017  . Acute on chronic systolic heart failure ([place]) 04/24/2017  . Hyponatremia 04/24/2017  . CHF (congestive heart failure) ([place]) 04/17/2017    Past Medical History:  Past Medical History:  Diagnosis Date  . CAD (coronary artery disease) 2007   CABG 2007 in [place]  . CHF (congestive heart failure) ([place])   . Diabetes mellitus without complication ([place])   . Hyperlipidemia   . Hypertension   . Pulmonary embolism ([place])   . Stroke [facility])    Past Surgical History:  Past Surgical History:  Procedure Laterality Date  . CARDIAC SURGERY    . CYSTOSCOPY N/A 04/25/2017   Procedure: CYSTOSCOPY FLEXIBLE;  Surgeon: Conrad Diboll, MD;  Location: [place];  Service: Vascular;  Laterality: N/A;  . EMBOLECTOMY  Right 04/25/2017   Procedure: EMBOLECTOMY RIGHT FEMORAL ARTERY;  Surgeon: Conrad Sherwood, MD;  Location: [place];  Service: Vascular;  Laterality: Right;  . IR PERCUTANEOUS ART THROMBECTOMY/INFUSION INTRACRANIAL INC DIAG ANGIO  04/25/2017  . PATCH ANGIOPLASTY Right 04/25/2017   Procedure: PATCH ANGIOPLASTY USING [place];  Surgeon: Conrad Walterhill, MD;  Location: [place];  Service: Vascular;  Laterality: Right;  . PROSTATE SURGERY  2002  . RADIOLOGY WITH ANESTHESIA N/A 04/24/2017   Procedure: RADIOLOGY WITH ANESTHESIA Code Stroke;  Surgeon: Corrie Mckusick, DO;  Location: [place];  Service: Anesthesiology;  Laterality: N/A;    Assessment & Plan Clinical Impression: Patient is a 71 y.o. right handedlimited English-speaking malewith history of CAD/CABG 2007 in [place], diabetes mellitus, systolic congestive heart failure, hypertension, CVA as well as questionable medical compliance. Patient lives with family. History taken from daughter, who also translates. Independent with a cane prior to admission. One level home with 4 steps to entry. Presented 04/24/2017 with increasing shortness of breath as well as ischemic right leg. Patient did require intubation. Chest x-ray cardiac enlargement without failure. CT of the head showed small focus of hypoattenuation within the right insula compatible with acute infarction. 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 CT angiogram showed occlusion of the right SFA just after the origin. Underwent right femoral-popliteal thromboembolectomy, right below the knee popliteal artery endarterectomy 04/25/2017 per Dr. Bridgett Larsson. Postoperatively a Foley catheter tube  was placed with findings of bladder neck contracture urology consulted underwent cystoscopy for placement of Foley tube. Postoperatively noted left-sided weakness with neurology consulted and MRI showed faint ischemic changes involving the right  insula out and basal ganglia. Echocardiogram with ejection fraction of 25% diffuse hypokinesis. No cardiac source of emboli. Acute on chronic anemia 9.6-12.0. Patient currently maintained on heparin as well as Coumadin therapy.Tolerating a regular diet. Physical therapy evaluation completed and ongoing with recommendations of physical medicine rehabilitation consult.Patient was admitted for a comprehensive rehabilitation program.   Patient transferred to CIR on 05/05/2017 .    Patient currently requires min with basic self-care skills secondary to muscle weakness, impaired timing and sequencing, motor apraxia and decreased coordination, decreased visual perceptual skills, decreased attention to left and decreased standing balance and decreased balance strategies.  Prior to hospitalization, patient could complete ADLs with independent .  Patient will benefit from skilled intervention to increase independence with basic self-care skills prior to discharge home with care partner.  Anticipate patient will require intermittent supervision and follow up outpatient.  OT - End of Session Activity Tolerance: Tolerates 30+ min activity without fatigue Endurance Deficit: No OT Assessment Rehab Potential (ACUTE ONLY): Good OT Patient demonstrates impairments in the following area(s): Balance;Cognition;Endurance;Motor;Safety;Vision OT Basic ADL's Functional Problem(s): Grooming;Bathing;Dressing;Toileting OT Transfers Functional Problem(s): Toilet;Tub/Shower OT Additional Impairment(s): Fuctional Use of Upper Extremity OT Plan OT Intensity: Minimum of 1-2 x/day, 45 to 90 minutes OT Frequency: 5 out of 7 days OT Duration/Estimated Length of Stay: 7-10 days OT Treatment/Interventions: Balance/vestibular training;Cognitive remediation/compensation;Discharge planning;Disease mangement/prevention;DME/adaptive equipment instruction;Functional electrical stimulation;Functional mobility training;Neuromuscular  re-education;Pain management;Community reintegration;Patient/family education;Psychosocial support;Self Care/advanced ADL retraining;Therapeutic Activities;Therapeutic Exercise;UE/LE Strength taining/ROM;UE/LE Coordination activities;Visual/perceptual remediation/compensation OT Self Feeding Anticipated Outcome(s): Supervision OT Basic Self-Care Anticipated Outcome(s): Mod I dressing, Supervision bathing OT Toileting Anticipated Outcome(s): Mod I OT Bathroom Transfers Anticipated Outcome(s): Mod I toilet, Supervision shower OT Recommendation Patient destination: Home Follow Up Recommendations: Outpatient OT Equipment Recommended: Tub/shower seat   Skilled Therapeutic Intervention OT eval completed with discussion of rehab process, OT purpose, POC, ELOS, and goals.  Pt declined bathing and dressing at this time.  Ambulated to ADL apt with RW with min assist and cues to attend to Lt as pt with tendency to veer Rt during ambulation.  Required seated rest break after 150' then able to complete distance to ADL apt with RW and min assist.  Engaged in toilet and tub/shower transfer with pt able to step over tub ledge with min assist with use of grab bars for stability.  Discussed recommendation for seat in shower for energy conservation and safety.  Returned to room with pt ambulating ~250' with RW, again requiring min assist and min cues to correct veering to Rt.  Ambulated 15' without AD with min assist with improved trajectory.    OT Evaluation Precautions/Restrictions  Precautions Precautions: Fall Precaution Comments: Rt post op shoe - pt refused use.  Restrictions Weight Bearing Restrictions: No Pain Pain Assessment Pain Assessment: No/denies pain Home Living/Prior Functioning Home Living Family/patient expects to be discharged to:: Private residence Living Arrangements: Children Available Help at Discharge: Family, Available 24 hours/day Type of Home: House Home Access: Stairs to  enter Technical brewer of Steps: 3 Entrance Stairs-Rails: None Home Layout: One level Bathroom Shower/Tub: Chiropodist: Standard  Lives With: Daughter IADL History Homemaking Responsibilities: No Prior Function Level of Independence: Independent with basic ADLs, Independent with gait  Able to Take Stairs?: Yes Vocation: Retired Comments: pt likes to be active, avid  walker ADL  See Function Navigator Vision Baseline Vision/History: Wears glasses Wears Glasses: At all times Patient Visual Report: No change from baseline Vision Assessment?: Yes Eye Alignment: Within Functional Limits Ocular Range of Motion: Within Functional Limits Alignment/Gaze Preference: Within Defined Limits Tracking/Visual Pursuits: Decreased smoothness of horizontal tracking Saccades: Within functional limits Perception  Perception: Impaired (mild Lt inattention during functional mobility) Cognition Overall Cognitive Status: Difficult to assess (language barrier) Arousal/Alertness: Awake/alert Orientation Level: Person;Place;Situation Person: Oriented Place: Oriented Situation: Oriented Memory: Appears intact Awareness: Appears intact Problem Solving: Appears intact Safety/Judgment: Appears intact Sensation Sensation Light Touch: Appears Intact Proprioception: Impaired by gross assessment (mild impairment on Lt UE) Coordination Gross Motor Movements are Fluid and Coordinated: No Fine Motor Movements are Fluid and Coordinated: No Finger Nose Finger Test: decreased coordination with LUE, mild intention tremor Heel Shin Test: decreased accuracy and control with LLE Motor  Motor Motor: Hemiplegia Motor - Skilled Clinical Observations: decreased strenght and coordination through LLE  Balance Balance Balance Assessed: Yes Static Sitting Balance Static Sitting - Balance Support: No upper extremity supported Static Sitting - Level of Assistance: 5: Stand by  assistance Dynamic Sitting Balance Dynamic Sitting - Balance Support: No upper extremity supported Dynamic Sitting - Level of Assistance: 5: Stand by assistance Static Standing Balance Static Standing - Balance Support: Bilateral upper extremity supported Static Standing - Level of Assistance: 5: Stand by assistance Dynamic Standing Balance Dynamic Standing - Balance Support: Bilateral upper extremity supported Dynamic Standing - Level of Assistance: 4: Min assist Extremity/Trunk Assessment RUE Assessment RUE Assessment: Within Functional Limits LUE Assessment LUE Assessment: Exceptions to [facility] (strength grossly 4/5)   See Function Navigator for Current Functional Status.   Refer to Care Plan for Long Term Goals  Recommendations for other services: None    Discharge Criteria: Patient will be discharged from OT if patient refuses treatment 3 consecutive times without medical reason, if treatment goals not met, if there is a change in medical status, if patient makes no progress towards goals or if patient is discharged from hospital.  The above assessment, treatment plan, treatment alternatives and goals were discussed and mutually agreed upon: by patient and by family  Ellwood Dense [facility] 05/06/2017, 3:06 PM

## 2017-05-06 NOTE — Progress Notes (Signed)
Trish Mage, RN Rehab Admission Coordinator Signed Physical Medicine and Rehabilitation  PMR Pre-admission Date of Service: 05/01/2017 11:52 AM  Related encounter: ED to Hosp-Admission (Discharged) from 04/24/2017 in [facility] 3E CHF       Hide copied text PMR Admission Coordinator Pre-Admission Assessment  Patient: [name] is an 71 y.o., male MRN: [id number] DOB: 01/14/1946 Height: 6' (182.9 cm) Weight: 79.6 kg (175 lb 6.4 oz)                                                                                                                                                  Insurance Information Self pay - no insurance  Medicaid Application Date:        Case Manager:   Disability Application Date:        Case Worker:    Emergency Conservator, museum/gallery Information    Name Relation Home Work Mobile   Frisby,Salimata Daughter [phone]     Sissoko,Gagni Other [phone]       Current Medical History  Patient Admitting Diagnosis: Right insular infarct, right lower ext thromboembolectomy/edarterectomy for ischemic occlusion:  History of Present Illness: A 71 y.o.right handedlimited English-speaking malewith history of CAD/CABG 2007 in [place], diabetes mellitus, systolic congestive heart failure, hypertension, CVA as well as questionable medical compliance. Patient lives with family. Independent with a cane prior to admission. One level home with 4 steps to entry. Presented 04/24/2017 with increasing shortness of breath as well as ischemic right leg. Patient did require intubation. Chest x-ray cardiac enlargement without failure. CT of the head showed small focus of hypoattenuation within the right insula compatible with acute infarction. 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 CT angiogram showed occlusion of the right SFA just after the origin. Underwent right  femoral-popliteal thromboembolectomy, right below the knee popliteal artery endarterectomy 04/25/2017 per Dr. Imogene Burn. Postoperatively a Foley catheter tube was placed with findings of bladder neck contracture urology consulted underwent cystoscopy for placement of Foley tube. Postoperatively noted left-sided weakness with neurology consulted and MRI showed faint ischemic changes involving the right insula out and basal ganglia. Echocardiogram with ejection fraction of 25% diffuse hypokinesis. No cardiac source of emboli. Acute on chronic anemia 9.6-10.7. Patient currently maintained on heparin as well as Coumadin therapy.Tolerating a regular diet.Physical therapy evaluation completed and ongoing with recommendations of physical medicine rehabilitation consult.  Patient to be admitted for a comprehensive inpatient rehabilitation program.    Total: 3=NIH  Past Medical History      Past Medical History:  Diagnosis Date  . CAD (coronary artery disease) 2007   CABG 2007 in [place]  . CHF (congestive heart failure) ([name])   . Diabetes mellitus without complication ([name])   . Hyperlipidemia   . Hypertension   . Pulmonary embolism ([name])   .  Stroke [facility])     Family History  family history includes Hypertension in his mother and sister.  Prior Rehab/Hospitalizations: Had HHPT 2 yrs ago after a CVA  Has the patient had major surgery during 100 days prior to admission? No  Current Medications   Current Facility-Administered Medications:  .  0.9 %  sodium chloride infusion, , Intravenous, Continuous, Coralyn Helling, MD, Stopped at 04/28/17 0053 .  acetaminophen (TYLENOL) tablet 650 mg, 650 mg, Oral, Q4H PRN, Berton Bon, NP, 650 mg at 05/04/17 2227 .  aspirin EC tablet 81 mg, 81 mg, Oral, Daily, Berton Bon, NP, 81 mg at 05/05/17 1003 .  atorvastatin (LIPITOR) tablet 80 mg, 80 mg, Oral, q1800, Jake Bathe, MD, 80 mg at 05/04/17 1728 .  docusate sodium (COLACE) capsule 100 mg,  100 mg, Oral, Daily, Rhyne, Samantha J, PA-C, 100 mg at 05/05/17 1002 .  furosemide (LASIX) tablet 80 mg, 80 mg, Oral, Daily, Nada Boozer R, NP, 80 mg at 05/05/17 1003 .  guaiFENesin-dextromethorphan (ROBITUSSIN DM) 100-10 MG/5ML syrup 15 mL, 15 mL, Oral, Q4H PRN, Rhyne, Samantha J, PA-C, 15 mL at 05/05/17 1306 .  heparin ADULT infusion 100 units/mL (25000 units/245mL sodium chloride 0.45%), 1,150 Units/hr, Intravenous, Continuous, Skains, Mark C, MD, Last Rate: 11.5 mL/hr at 05/05/17 0954, 1,150 Units/hr at 05/05/17 0954 .  insulin aspart (novoLOG) injection 0-5 Units, 0-5 Units, Subcutaneous, QHS, Quintella Reichert, MD, 3 Units at 05/04/17 2150 .  insulin aspart (novoLOG) injection 0-9 Units, 0-9 Units, Subcutaneous, TID WC, Turner, Traci R, MD, 3 Units at 05/05/17 1254 .  losartan (COZAAR) tablet 50 mg, 50 mg, Oral, Daily, Croitoru, Mihai, MD, 50 mg at 05/05/17 1003 .  magnesium sulfate IVPB 2 g 50 mL, 2 g, Intravenous, Daily PRN, Rhyne, Samantha J, PA-C .  MEDLINE mouth rinse, 15 mL, Mouth Rinse, BID, Skains, Mark C, MD, 15 mL at 05/05/17 1008 .  morphine 2 MG/ML injection 2 mg, 2 mg, Intravenous, Q2H PRN, Rhyne, Samantha J, PA-C .  mupirocin cream (BACTROBAN) 2 %, , Topical, Daily, Skains, Mark C, MD .  ondansetron (ZOFRAN) injection 4 mg, 4 mg, Intravenous, Q6H PRN, Berton Bon, NP .  pantoprazole (PROTONIX) EC tablet 40 mg, 40 mg, Oral, Daily, Emi Holes, RPH, 40 mg at 05/05/17 1003 .  phenol (CHLORASEPTIC) mouth spray 1 spray, 1 spray, Mouth/Throat, PRN, Rhyne, Samantha J, PA-C, 1 spray at 04/28/17 1042 .  potassium chloride SA (K-DUR,KLOR-CON) CR tablet 20 mEq, 20 mEq, Oral, BID, Croitoru, Mihai, MD, 20 mEq at 05/05/17 1003 .  protein supplement (PREMIER PROTEIN) liquid, 11 oz, Oral, BID BM, Jake Bathe, MD, 11 oz at 05/04/17 1550 .  spironolactone (ALDACTONE) tablet 50 mg, 50 mg, Oral, Daily, Croitoru, Mihai, MD, 50 mg at 05/05/17 1002 .  warfarin (COUMADIN) tablet 6 mg, 6  mg, Oral, ONCE-1800, Skains, Mark C, MD .  warfarin (COUMADIN) video, , Does not apply, Once, Emi Holes, RPH .  Warfarin - Pharmacist Dosing Inpatient, , Does not apply, q1800, Bertram Millard, RPH  Patients Current Diet: Diet heart healthy/carb modified Room service appropriate? Yes; Fluid consistency: Thin  Precautions / Restrictions Precautions Precautions: Fall Precaution Comments: left post op shoe from fractured toe  Restrictions Weight Bearing Restrictions: No   Has the patient had 2 or more falls or a fall with injury in the past year?No  Prior Activity Level Community (5-7x/wk): Walks outside daily, goes out with daughter about 3 X a week, not driving.  Home  Assistive Devices / Equipment Home Assistive Devices/Equipment: Cane (specify quad or straight) Home Equipment: Cane - single point  Prior Device Use: Indicate devices/aids used by the patient prior to current illness, exacerbation or injury? Straight cane for the past 1-2 weeks  Prior Functional Level Prior Function Level of Independence: Independent with assistive device(s) Comments: used SPC for ambulation  Self Care: Did the patient need help bathing, dressing, using the toilet or eating?  Independent  Indoor Mobility: Did the patient need assistance with walking from room to room (with or without device)? Independent  Stairs: Did the patient need assistance with internal or external stairs (with or without device)? Independent  Functional Cognition: Did the patient need help planning regular tasks such as shopping or remembering to take medications? Independent  Current Functional Level Cognition  Overall Cognitive Status: Within Functional Limits for tasks assessed (also difficult to assess due to use of video interpreter) Difficult to assess due to: Non-English speaking Current Attention Level: Selective Orientation Level: Oriented X4 Following Commands: Follows one step commands  consistently, Follows multi-step commands with increased time Safety/Judgement: Decreased awareness of deficits General Comments: At times, pt speaking about unrelated topics. Able to follow one-step commands throughout session appropriately.     Extremity Assessment (includes Sensation/Coordination)  Upper Extremity Assessment: Generalized weakness, LUE deficits/detail, Difficult to assess due to impaired cognition LUE Deficits / Details: LUE seems weaker than the right in generalized terms, will continue to assess  Lower Extremity Assessment: Defer to PT evaluation    ADLs  Overall ADL's : Needs assistance/impaired Grooming: Sitting, Supervision/safety Grooming Details (indicate cue type and reason): Min assist for sitting balance. VC's to initiate grooming tasks.  Upper Body Dressing : Moderate assistance Upper Body Dressing Details (indicate cue type and reason): To change gown due to bed wet on arrival.  Lower Body Dressing: Total assistance Toilet Transfer: Minimal assistance, Moderate assistance, +2 for physical assistance Toilet Transfer Details (indicate cue type and reason): Able to power up into standing with min assist +2 and requiring mod assist +2 to maintain balance. Able to ambulate pushing recliner for UE support with min assist +2. Functional mobility during ADLs: Moderate assistance, +2 for physical assistance General ADL Comments: Facilitated improved ability to complete grooming tasks in seated position this session. Able to follow all commands well. Facilitated improved use of L UE and noted slight inattention at times. However, there is also a cultural aspect as in pt's home country he reports it is highly uncommon to use non-dominant hand for any tasks.     Mobility  Overal bed mobility: Needs Assistance Bed Mobility: Rolling, Sidelying to Sit Rolling: Min assist Sidelying to sit: Mod assist, +2 for physical assistance, HOB elevated Supine to sit: Mod assist,  +2 for physical assistance, HOB elevated Sit to supine: Mod assist, +2 for physical assistance, HOB elevated General bed mobility comments: pt OOB in chair upon arrival    Transfers  Overall transfer level: Needs assistance Equipment used: Rolling walker (2 wheeled), None Transfers: Sit to/from Stand Sit to Stand: Min assist Stand pivot transfers: Mod assist General transfer comment: pt demonstrated carry over of safe hand placement; sit to stands X3; initial stand pt was able to pull up pants with min A for balance, wide BOS, and bilat LE resting against recliner; next two trials assist to steady when transitioning hand placement to RW    Ambulation / Gait / Stairs / Wheelchair Mobility  Ambulation/Gait Ambulation/Gait assistance: +2 safety/equipment, Min assist, Mod assist (+2  for chair follow) Ambulation Distance (Feet):  (120 ft total with seated rest break) Assistive device: Rolling walker (2 wheeled) Gait Pattern/deviations: Decreased step length - left, Step-through pattern, Decreased dorsiflexion - left, Trunk flexed, Decreased stance time - left, Decreased step length - right General Gait Details: multimodal cues for posture; assist to manage RW and for balance especially when navigating objects in hallway and for turning; pt continues to demonstrate weak L hip flexors and L knee hyperextension in stance phase; difficulty with advancing L LE when becoming fatigued Gait velocity: decreased Gait velocity interpretation: Below normal speed for age/gender    Posture / Balance Dynamic Sitting Balance Sitting balance - Comments: Pt with posterior pelvic tilt and lean sitting EOB needing Min guard-Mod A for balance. When fatigued, pt with LOB posteriorly and to left. Able to initiate trunk for anterior weight shift and lean but not sustain. Difficulty regaining midline positioning following reach to the L.  Balance Overall balance assessment: Needs assistance Sitting-balance support:  Feet supported Sitting balance-Leahy Scale: Fair Sitting balance - Comments: Pt with posterior pelvic tilt and lean sitting EOB needing Min guard-Mod A for balance. When fatigued, pt with LOB posteriorly and to left. Able to initiate trunk for anterior weight shift and lean but not sustain. Difficulty regaining midline positioning following reach to the L.  Postural control: Posterior lean, Left lateral lean Standing balance support: During functional activity, Bilateral upper extremity supported Standing balance-Leahy Scale: Poor Standing balance comment: Requires UE support in standing on back of recliner with left lateral lean. Mod assist to maintain balance in standing position.     Special needs/care consideration BiPAP/CPAP No CPM No Continuous Drip IV Heparin drip Dialysis No       Life Vest No Oxygen No Special Bed No Trach Size No Wound Vac (area) No    Skin Had a clot in his right leg, has post op staples right leg                               Bowel mgmt: Last BM 05/04/17 Bladder mgmt: Voiding in urinal, some documented incontinence Diabetic mgmt Yes, on insulin and oral medication at home    Previous Home Environment Living Arrangements: Children Available Help at Discharge: Family, Available 24 hours/day Type of Home: House Home Layout: One level Home Access: Stairs to enter Secretary/administrator of Steps: 4 Bathroom Shower/Tub: Engineer, manufacturing systems: Standard Home Care Services: No  Discharge Living Setting Plans for Discharge Living Setting: House, Lives with (comment) (Currently staying with his daughter.) Type of Home at Discharge: House Discharge Home Layout: One level Discharge Home Access: Stairs to enter Entrance Stairs-Number of Steps: 3 steps per daughter Does the patient have any problems obtaining your medications?: No  Social/Family/Support Systems Patient Roles: Spouse, Parent (Wife in [place] and daughter here in  [place]) Solicitor Information: Architect - daughter  Anticipated Caregiver: Daughter Anticipated Industrial/product designer Information: Chauncey Mann - daughter, - [phone] Ability/Limitations of Caregiver: Daughter has taken up to 6 weeks FMLA from work Caregiver Availability: 24/7 Discharge Plan Discussed with Primary Caregiver: Yes Is Caregiver In Agreement with Plan?: Yes Does Caregiver/Family have Issues with Lodging/Transportation while Pt is in Rehab?: No  Goals/Additional Needs Patient/Family Goal for Rehab: PT mod I, OT mod I and supervision goals Expected length of stay: 8-11 days Cultural Considerations: Is from [place] in [place], speaks [place], is Muslim, here visiting daughter Dietary Needs: Heart healthy, carb mod,  thin liquids Equipment Needs: TBD Pt/Family Agrees to Admission and willing to participate: Yes Program Orientation Provided & Reviewed with Pt/Caregiver Including Roles  & Responsibilities: Yes  Decrease burden of Care through IP rehab admission: N/A  Possible need for SNF placement upon discharge: No  Patient Condition: This patient's medical and functional status has changed since the consult dated: 04/29/17 in which the Rehabilitation Physician determined and documented that the patient's condition is appropriate for intensive rehabilitative care in an inpatient rehabilitation facility. See "History of Present Illness" (above) for medical update. Functional changes are:  Currently requiring min/mod assist to ambulate 120 feet RW with seated rest break. Patient's medical and functional status update has been discussed with the Rehabilitation physician and patient remains appropriate for inpatient rehabilitation. Will admit to inpatient rehab today.  Preadmission Screen Completed By:  Trish Mage, 05/05/2017 3:20 PM ______________________________________________________________________   Discussed status with Dr. Allena Katz on 05/05/17 at 1545 and received  telephone approval for admission today.  Admission Coordinator:  Trish Mage, time 1545/Date 05/05/17       Cosigned by: Marcello Fennel, MD at 05/05/2017 4:07 PM  Revision History

## 2017-05-06 NOTE — Progress Notes (Signed)
Late entry.1831 Patient reports shortness of breath to son in room. VS B/P 105/66 P 103 R 24 O2 sat at 98% RA. Patient reports decrease shortness of breath after sitting up in bed 90 degrees. Patient denies chest pain. 1854 D. Angiuilli, PA notified and orders received. Reassessed patient and patient reports no shortness of breath at this time but reports episodes throughout day. Instructed patient to call nursing for any and all episodes of shortness of breath. Continue with plan of care.  [name]

## 2017-05-06 NOTE — Progress Notes (Signed)
[facility] PHYSICAL MEDICINE & REHABILITATION     PROGRESS NOTE  Subjective/Complaints:  Pt seen sitting up at the EOB this Am.  He has to urinate.  He states he slept well overnight and is ready to begin therapies.  ROS: Limited due to language, but appears to denies CP, SOB, N/V/D.  Objective: Vital Signs: Blood pressure 108/60, pulse (!) 56, temperature 98.8 F (37.1 C), temperature source Oral, resp. rate 18, height 6' (1.829 m), weight 79.2 kg (174 lb 11.2 oz), SpO2 99 %. No results found.  Recent Labs  05/05/17 0433 05/05/17 1928  WBC 8.6 7.4  HGB 12.0* 12.5*  HCT 33.3* 34.4*  PLT 282 274    Recent Labs  05/05/17 0433 05/05/17 1928  NA 133* 132*  K 4.2 4.3  CL 97* 97*  GLUCOSE 169* 179*  BUN 35* 32*  CREATININE 1.37* 1.39*  CALCIUM 9.5 9.7   CBG (last 3)   Recent Labs  05/05/17 0754 05/05/17 1104 05/05/17 1621  GLUCAP 186* 228* 221*    Wt Readings from Last 3 Encounters:  05/06/17 79.2 kg (174 lb 11.2 oz)  05/05/17 79.6 kg (175 lb 6.4 oz)  04/23/17 85.5 kg (188 lb 6.4 oz)    Physical Exam:  BP 108/60 (BP Location: Right Arm)   Pulse (!) 56   Temp 98.8 F (37.1 C) (Oral)   Resp 18   Ht 6' (1.829 m)   Wt 79.2 kg (174 lb 11.2 oz)   SpO2 99%   BMI 23.69 kg/m  Constitutional: He appears well-developed and well-nourished.  HENT: Normocephalic and atraumatic.  Eyes: EOM are normal. No discharge.  Cardiovascular: Normal rate and regular rhythm.  No JVD. Respiratory: Effort normal and breath sounds normal.  GI: Bowel sounds are normal. He exhibits no distension.  Musculoskeletal: He exhibits tenderness. He exhibits no edema.  Neurological: He is alert.  Patient makes eye contact with examiner.  Limited English-speaking.  Follows simple commands.  RUE 4+/5. (stable) LUE 4+/5 prox to distal.  RLE: 4/5 proximal to distal LLE: 4-4+/5 prox to distal.   Skin: Skin is warm and dry.  Staples to RLE c/d/i Ulceration right third toe  Psychiatric:   Unable to assess due to language   Assessment/Plan: 1. 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 Physiatrist is providing close team supervision and 24 hour management of active medical problems listed below. Physiatrist and rehab team continue to assess barriers to discharge/monitor patient progress toward functional and medical goals.  Function:  Bathing Bathing position      Bathing parts      Bathing assist        Upper Body Dressing/Undressing Upper body dressing                    Upper body assist        Lower Body Dressing/Undressing Lower body dressing                                  Lower body assist        Toileting Toileting   Toileting steps completed by patient: Adjust clothing prior to toileting, Performs perineal hygiene, Adjust clothing after toileting   Toileting Assistive Devices: Grab bar or rail  Toileting assist Assist level: Touching or steadying assistance (Pt.75%)   Transfers Chair/bed transfer  Secondary school teacher Comprehension Comprehension assist level: Understands basic 90% of the time/cues < 10% of the time  Expression Expression assist level: Expresses basic 90% of the time/requires cueing < 10% of the time.  Social Interaction Social Interaction assist level: Interacts appropriately 90% of the time - Needs monitoring or encouragement for participation or interaction.  Problem Solving Problem solving assist level: Solves basic 90% of the time/requires cueing < 10% of the time  Memory Memory assist level: Recognizes or recalls 90% of the time/requires cueing < 10% of the time    Medical Problem List and Plan: 1.  Left side weakness secondary to right insular infarction after right lower extremity thromboembolectomy/endarterectomy  for ischemic occlusion  Begin CIR 2.  DVT Prophylaxis/Anticoagulation: Heparin ggt/Coumadin therapy. Monitor for any bleeding episodes  INR subtherapeutic 9/18 3. Pain Management: Tylenol as needed  Gabapentin 100 TID started 9/18 4. Mood: Provide Emotional support 5. Neuropsych: This patient is capable of making decisions on his own behalf. 6. Skin/Wound Care: Routine skin checks 7. Fluids/Electrolytes/Nutrition: Routine I&Os 8. 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 No chest pain or shortness of breath. Continue aspirin therapy. Follow-up cardiology services 9. Diabetes mellitus with peripheral neuropathy. Hemoglobin A1c 8.8-.SSI. Check blood sugars before meals and at bedtime. Patient on Lantus insulin 5 units daily at bedtime as well as Glucophage 1000 mg daily prior to admission.  Monitor with increasing mobility 10. Systolic congestive heart failure. Lasix 80 mg daily. Monitor for any signs of fluid overload Filed Weights   05/05/17 1850 05/06/17 0357  Weight: 79.6 kg (175 lb 6.4 oz) 79.2 kg (174 lb 11.2 oz)  11. Hypertension. Aldactone 50 mg daily, Cozaar 50 mg daily.   Monitor with increased mobility 12. Hyperlipidemia. Lipitor 13. Acute on chronic anemia.   Hb 12.5 on 9/17  Cont to monitor 14. Questionable medical compliance. Counseling 15. Hyponatremia  Na+ 132 on 9/17  Cont to monitor 16. AKI  Cr 1.39 on 9/17  Encourage fluids  LOS (Days) 1 A FACE TO FACE EVALUATION WAS PERFORMED  [name] 05/06/2017 8:37 AM

## 2017-05-07 ENCOUNTER — Inpatient Hospital Stay (HOSPITAL_COMMUNITY): Payer: Self-pay

## 2017-05-07 ENCOUNTER — Inpatient Hospital Stay (HOSPITAL_COMMUNITY): Payer: Self-pay | Admitting: Occupational Therapy

## 2017-05-07 DIAGNOSIS — R0602 Shortness of breath: Secondary | ICD-10-CM

## 2017-05-07 DIAGNOSIS — Z5181 Encounter for therapeutic drug level monitoring: Secondary | ICD-10-CM

## 2017-05-07 DIAGNOSIS — Z7901 Long term (current) use of anticoagulants: Secondary | ICD-10-CM

## 2017-05-07 DIAGNOSIS — E119 Type 2 diabetes mellitus without complications: Secondary | ICD-10-CM

## 2017-05-07 DIAGNOSIS — M792 Neuralgia and neuritis, unspecified: Secondary | ICD-10-CM

## 2017-05-07 LAB — GLUCOSE, CAPILLARY
GLUCOSE-CAPILLARY: 212 mg/dL — AB (ref 65–99)
GLUCOSE-CAPILLARY: 208 mg/dL — AB (ref 65–99)
Glucose-Capillary: 227 mg/dL — ABNORMAL HIGH (ref 65–99)
Glucose-Capillary: 188 mg/dL — ABNORMAL HIGH (ref 65–99)

## 2017-05-07 LAB — CBC
HCT: 34.5 % — ABNORMAL LOW (ref 39.0–52.0)
Hemoglobin: 12.4 g/dL — ABNORMAL LOW (ref 13.0–17.0)
MCH: 25.2 pg — AB (ref 26.0–34.0)
MCHC: 35.9 g/dL (ref 30.0–36.0)
MCV: 70.1 fL — ABNORMAL LOW (ref 78.0–100.0)
PLATELETS: 283 10*3/uL (ref 150–400)
RBC: 4.92 MIL/uL (ref 4.22–5.81)
RDW: 14.9 % (ref 11.5–15.5)
WBC: 7.5 10*3/uL (ref 4.0–10.5)

## 2017-05-07 LAB — PROTIME-INR
INR: 1.85
PROTHROMBIN TIME: 21.2 s — AB (ref 11.4–15.2)

## 2017-05-07 LAB — HEPARIN LEVEL (UNFRACTIONATED): HEPARIN UNFRACTIONATED: 0.48 [IU]/mL (ref 0.30–0.70)

## 2017-05-07 MED ORDER — WARFARIN SODIUM 7.5 MG PO TABS
7.5000 mg | ORAL_TABLET | Freq: Once | ORAL | Status: AC
  Administered 2017-05-07: 7.5 mg via ORAL
  Filled 2017-05-07: qty 1

## 2017-05-07 MED ORDER — INSULIN GLARGINE 100 UNIT/ML ~~LOC~~ SOLN
5.0000 [IU] | Freq: Every day | SUBCUTANEOUS | Status: DC
  Administered 2017-05-07 – 2017-05-10 (×4): 5 [IU] via SUBCUTANEOUS
  Filled 2017-05-07 (×4): qty 0.05

## 2017-05-07 NOTE — Progress Notes (Addendum)
Physical Therapy Note  Patient Details  Name: [name] MRN: [id number] Date of Birth: [birth year] Today's Date: 05/07/2017  tx 1:  1115-1200, 4 individual ts Pain: none per pt dtr present, translated  Seated neuro re-ed via demo and multimodal cues for bil hip add/abduction against resistance, R/L long arc quad kneee ext, ankle circles iwht extended knee focusing on smoothness of movement LLE.  Gait x 50' with RW over carpet, min guard and cues for attention and turning RW to L .  Gait  R/L with RW over carpet in congested area requiring sidestepping. Pt required seated rest break after 15' each direction.   Cues for safe hand placement on RW throughout session for sit>< stand. Forced use of R hand for fine motor task of manipulating small Scrabble game pieces onto holders, x 10, with frequent dropping, but improvement with practice.  Pt left resting in w/c with  all needs within reach. PT verified with dtr that pt is compliant with using call bell.  tx 2:  1500-1600, 60 min individual tx Pain: none per pt  Pt stated he was very tired, but agreed to seated exs in bed.  Seated 10 x 1 each R/L  Long arc quad knee ext, ankle pumps.  Sit>< stand with towel roll between knees for core activation, x 2.  DOE; pt returned to bed.  In supine- HR 89, BP 103/52.  Neuro re-ed focusing on eccentric control : R/L straight leg raises, bil lower trunk rotation against resistance, bil bridging. Pt exhausted and falling asleep. Pt left resting in bed with all needs within reach, and dtr present. See function navigator for current status.  Derell Bruun 05/07/2017, 11:41 AM

## 2017-05-07 NOTE — Progress Notes (Signed)
ANTICOAGULATION CONSULT NOTE - Follow up Consult  Pharmacy Consult for Heparin and Warfarin Indication: stroke and SFA thrombus  No Known Allergies  Patient Measurements: Height: 6' (182.9 cm) Weight: 175 lb 2.5 oz (79.5 kg) IBW/kg (Calculated) : 77.6 Heparin Dosing Weight: 87.7kg  Vital Signs: Temp: 98 F (36.7 C) (09/19 0616) Temp Source: Oral (09/19 0616) BP: 117/62 (09/19 0616) Pulse Rate: 65 (09/19 0616)  Labs:  Recent Labs  05/05/17 0433 05/05/17 1928 05/06/17 0803 05/06/17 2032 05/07/17 0523  HGB 12.0* 12.5*  --   --  12.4*  HCT 33.3* 34.4*  --   --  34.5*  PLT 282 274  --   --  283  LABPROT 21.7*  --  21.1*  --  21.2*  INR 1.91  --  1.84  --  1.85  HEPARINUNFRC 0.53  --  0.64 0.51 0.48  CREATININE 1.37* 1.39*  --   --   --     Estimated Creatinine Clearance: 54.3 mL/min (A) (by C-G formula based on SCr of 1.39 mg/dL (H)).  Medications:  Heparin @ 1400 units/hr  Assessment: 71 yom with recent acute stroke and subsequent SFA thrombus. Now on heparin bridge to coumadin. Heparin level 0.48 on 1000 units/hr, INR 1.85  9/10 MRI without hemorrhagic conversion  Goal of Therapy:  INR 2-3 Heparin level 0.3-0.5 units/ml Monitor platelets by anticoagulation protocol: Yes   Plan:  - Continue heparin 1000 units/hr - Warfarin 7.5mg  tonight - Daily heparin level, INR, CBC   Bayard Hugger, PharmD, BCPS  Clinical Pharmacist  Pager: [phone]    05/07/2017 9:44 AM

## 2017-05-07 NOTE — Progress Notes (Signed)
[place] PHYSICAL MEDICINE & REHABILITATION     PROGRESS NOTE  Subjective/Complaints:  Pt seen sitting up in bed this AM.  Per daughter, he slept fairly overnight with some SOMB.  O2 Says WNL.  Daughter interprets.  He had a good day with therapies yesterday.   ROS: Denies CP, SOB, N/V/D.  Objective: Vital Signs: Blood pressure 117/62, pulse 65, temperature 98 F (36.7 C), temperature source Oral, resp. rate 20, height 6' (1.829 m), weight 79.5 kg (175 lb 2.5 oz), SpO2 98 %. Dg Chest 2 View  Result Date: 05/06/2017 CLINICAL DATA:  Shortness of breath EXAM: CHEST  2 VIEW COMPARISON:  04/27/2017 FINDINGS: Post sternotomy changes. Cardiomegaly. Mild increased opacity posteriorly on the lateral view. Trace pleural effusions. Overall improved aeration since the 04/27/2017 study. IMPRESSION: 1. Cardiomegaly 2. Increased posterior basilar opacity could reflect residual edema or infiltrate with overall improved aeration of the lungs since the comparison radiograph. Trace pleural effusions. Electronically Signed   By: Jasmine Pang M.D.   On: 05/06/2017 21:49    Recent Labs  05/05/17 1928 05/07/17 0523  WBC 7.4 7.5  HGB 12.5* 12.4*  HCT 34.4* 34.5*  PLT 274 283    Recent Labs  05/05/17 0433 05/05/17 1928  NA 133* 132*  K 4.2 4.3  CL 97* 97*  GLUCOSE 169* 179*  BUN 35* 32*  CREATININE 1.37* 1.39*  CALCIUM 9.5 9.7   CBG (last 3)   Recent Labs  05/05/17 2105 05/06/17 0620 05/06/17 1209  GLUCAP 188* 169* 227*    Wt Readings from Last 3 Encounters:  05/07/17 79.5 kg (175 lb 2.5 oz)  05/05/17 79.6 kg (175 lb 6.4 oz)  04/23/17 85.5 kg (188 lb 6.4 oz)    Physical Exam:  BP 117/62 (BP Location: Left Arm)   Pulse 65   Temp 98 F (36.7 C) (Oral)   Resp 20   Ht 6' (1.829 m)   Wt 79.5 kg (175 lb 2.5 oz)   SpO2 98%   BMI 23.76 kg/m  Constitutional: He appears well-developed and well-nourished.  HENT: Normocephalic and atraumatic.  Eyes: EOM are normal. No discharge.   Cardiovascular: RRR.  No JVD. Respiratory: Effort normal and breath sounds normal.  GI: Bowel sounds are normal. He exhibits no distension.  Musculoskeletal: He exhibits tenderness. He exhibits no edema.  Neurological: He is alert.  Patient makes eye contact with examiner.  Limited English-speaking.  Follows simple commands.  RUE 4+/5 (unchanged) LUE 4+/5 prox to distal.  RLE: 4/5 proximal to distal LLE: 4-4+/5 prox to distal.   Skin: Skin is warm and dry.  Staples to RLE c/d/i Psychiatric:  Unable to assess due to language   Assessment/Plan: 1. 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 Physiatrist is providing close team supervision and 24 hour management of active medical problems listed below. Physiatrist and rehab team continue to assess barriers to discharge/monitor patient progress toward functional and medical goals.  Function:  Bathing Bathing position   Position: Wheelchair/chair at sink  Bathing parts Body parts bathed by patient: Right arm, Left arm, Chest, Abdomen, Front perineal area, Buttocks, Right upper leg, Left upper leg, Right lower leg, Left lower leg Body parts bathed by helper: Back  Bathing assist Assist Level: Touching or steadying assistance(Pt > 75%)      Upper Body Dressing/Undressing Upper body dressing   What is the patient wearing?: Pull over shirt/dress  Pull over shirt/dress - Perfomed by patient: Put head through opening, Thread/unthread right sleeve, Thread/unthread left sleeve, Pull shirt over trunk          Upper body assist Assist Level: Set up      Lower Body Dressing/Undressing Lower body dressing   What is the patient wearing?: Pants, Non-skid slipper socks     Pants- Performed by patient: Thread/unthread right pants leg, Thread/unthread left pants leg, Pull pants up/down   Non-skid slipper socks-  Performed by patient: Don/doff right sock, Don/doff left sock                    Lower body assist Assist for lower body dressing: Touching or steadying assistance (Pt > 75%)      Toileting Toileting   Toileting steps completed by patient: Adjust clothing prior to toileting, Performs perineal hygiene, Adjust clothing after toileting   Toileting Assistive Devices: Grab bar or rail  Toileting assist Assist level: Touching or steadying assistance (Pt.75%)   Transfers Chair/bed transfer   Chair/bed transfer method: Stand pivot Chair/bed transfer assist level: Touching or steadying assistance (Pt > 75%) Chair/bed transfer assistive device: Armrests, Patent attorney     Max distance: 250 ft Assist level: Touching or steadying assistance (Pt > 75%)   Wheelchair   Type: Manual Max wheelchair distance: 15 ft Assist Level: Touching or steadying assistance (Pt > 75%)  Cognition Comprehension Comprehension assist level: Understands basic 90% of the time/cues < 10% of the time  Expression Expression assist level: Expresses basic 90% of the time/requires cueing < 10% of the time.  Social Interaction Social Interaction assist level: Interacts appropriately 90% of the time - Needs monitoring or encouragement for participation or interaction.  Problem Solving Problem solving assist level: Solves basic 90% of the time/requires cueing < 10% of the time  Memory Memory assist level: Recognizes or recalls 90% of the time/requires cueing < 10% of the time    Medical Problem List and Plan: 1.  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  DVT Prophylaxis/Anticoagulation: Heparin ggt/Coumadin therapy. Monitor for any bleeding episodes  INR subtherapeutic 9/19 3. Pain Management: Tylenol as needed  Gabapentin 100 TID started 9/18 4. Mood: Provide Emotional  support 5. Neuropsych: This patient is capable of making decisions on his own behalf. 6. Skin/Wound Care: Routine skin checks 7. Fluids/Electrolytes/Nutrition: Routine I&Os 8. 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 No chest pain or shortness of breath. Continue aspirin therapy. Follow-up cardiology services 9. Diabetes mellitus with peripheral neuropathy. Hemoglobin A1c 8.8-.SSI. Check blood sugars before meals and at bedtime. Patient on Lantus insulin 5 units daily at bedtime as well as Glucophage 1000 mg daily prior to admission.    Lantus 5 unit qhs started 9/19 10. Systolic congestive heart failure. Lasix 80 mg daily. Monitor for any signs of fluid overload Filed Weights   05/05/17 1850 05/06/17 0357 05/07/17 0616  Weight: 79.6 kg (175 lb 6.4 oz) 79.2 kg (174 lb 11.2 oz) 79.5 kg (175 lb 2.5 oz)  11. Hypertension. Aldactone 50 mg daily, Cozaar 50 mg daily.   Relatively controlled 9/19 12. Hyperlipidemia. Lipitor 13. Acute on chronic anemia.   Hb 12.4 on 9/19  Cont to monitor 14. Questionable medical compliance. Counseling 15. Hyponatremia  Na+ 132 on 9/17  Cont to monitor 16. AKI  Cr 1.39 on 9/17  Encourage fluids 17. Intermittent SOB  CXR  reviewed showing some edema  Cont lasix  LOS (Days) 2 A FACE TO FACE EVALUATION WAS PERFORMED  Ankit Karis Juba 05/07/2017 11:15 AM

## 2017-05-07 NOTE — Progress Notes (Signed)
Occupational Therapy Session Note  Patient Details  Name: [name] MRN: [id number] Date of Birth: March 04, 1946  Today's Date: 05/07/2017 OT Individual Time: 4098-1191 and 4782-9562 OT Individual Time Calculation (min): 45 min and 41 min   Short Term Goals: Week 1:  OT Short Term Goal 1 (Week 1): STG = LTGs due to ELOS  Skilled Therapeutic Interventions/Progress Updates:    1) Treatment session with focus on fine and gross motor control with nondominant LUE.  Pt declined bathing/dressing this AM, stating that he was good.  Completed stand pivot transfer bed > recliner with min assist.  Completed 9 hole peg test with Rt: 47 seconds and Lt: 1:28, noted difficulty with picking up pegs with Lt hand and mild intention tremor when approaching target.  Pt providing excuses stating that he is Rt hand dominant, educated on stroke deficits and noted impairments secondary to effected LUE.  Engaged in 3D pipe tree puzzle with focus on increased use of LUE, pt required max cues faded to mod with sequencing and problem solving during novel building task.  Utilized LUE as gross assist throughout, despite cues to increase use.  Daughter present throughout session assisting with interpretation.  2) Treatment session with focus on functional mobility and functional use of LUE.  Pt reports fatigue but no c/o pain.  Ambulated to therapy gym > 150' with RW with min assist and cues when turning to Lt.  Engaged in dynamic standing activity incorporating reaching into various planes with LUE to challenge balance and gross motor control.  Pt required frequent rest breaks due to fatigue.  Engaged in ball toss in standing with min assist, increased challenge to standing on foam surface with min assist for balance without UE support.  Returned to room ambulating with min assist and RW.  Pt left upright in recliner with all needs in reach.  Pt's daughter present throughout session assisting with interpretation.  Therapy  Documentation Precautions:  Precautions Precautions: Fall Precaution Comments: Rt post op shoe - pt refused use.  Restrictions Weight Bearing Restrictions: No Pain: Pain Assessment Pain Assessment: No/denies pain  See Function Navigator for Current Functional Status.   Therapy/Group: Individual Therapy  [name] 05/07/2017, 11:45 AM

## 2017-05-08 ENCOUNTER — Ambulatory Visit: Payer: Self-pay | Admitting: Physician Assistant

## 2017-05-08 ENCOUNTER — Inpatient Hospital Stay (HOSPITAL_COMMUNITY): Payer: Self-pay | Admitting: Physical Therapy

## 2017-05-08 ENCOUNTER — Inpatient Hospital Stay (HOSPITAL_COMMUNITY): Payer: Self-pay

## 2017-05-08 ENCOUNTER — Inpatient Hospital Stay (HOSPITAL_COMMUNITY): Payer: Self-pay | Admitting: Occupational Therapy

## 2017-05-08 LAB — CBC
HCT: 32.9 % — ABNORMAL LOW (ref 39.0–52.0)
HEMOGLOBIN: 12 g/dL — AB (ref 13.0–17.0)
MCH: 25.5 pg — AB (ref 26.0–34.0)
MCHC: 36.5 g/dL — AB (ref 30.0–36.0)
MCV: 70 fL — AB (ref 78.0–100.0)
Platelets: 260 10*3/uL (ref 150–400)
RBC: 4.7 MIL/uL (ref 4.22–5.81)
RDW: 14.8 % (ref 11.5–15.5)
WBC: 7 10*3/uL (ref 4.0–10.5)

## 2017-05-08 LAB — PROTIME-INR
INR: 2.17
PROTHROMBIN TIME: 24 s — AB (ref 11.4–15.2)

## 2017-05-08 LAB — GLUCOSE, CAPILLARY
GLUCOSE-CAPILLARY: 276 mg/dL — AB (ref 65–99)
GLUCOSE-CAPILLARY: 255 mg/dL — AB (ref 65–99)
Glucose-Capillary: 236 mg/dL — ABNORMAL HIGH (ref 65–99)
Glucose-Capillary: 176 mg/dL — ABNORMAL HIGH (ref 65–99)

## 2017-05-08 LAB — HEPARIN LEVEL (UNFRACTIONATED): HEPARIN UNFRACTIONATED: 0.34 [IU]/mL (ref 0.30–0.70)

## 2017-05-08 MED ORDER — WARFARIN SODIUM 5 MG PO TABS
5.0000 mg | ORAL_TABLET | Freq: Once | ORAL | Status: AC
  Administered 2017-05-08: 5 mg via ORAL
  Filled 2017-05-08: qty 1

## 2017-05-08 MED ORDER — LOSARTAN POTASSIUM 50 MG PO TABS
25.0000 mg | ORAL_TABLET | Freq: Every day | ORAL | Status: DC
  Administered 2017-05-09 – 2017-05-11 (×3): 25 mg via ORAL
  Filled 2017-05-08 (×3): qty 1

## 2017-05-08 NOTE — Progress Notes (Addendum)
Physical Therapy Note  Patient Details  Name: Devery Murgia MRN: [id number] Date of Birth: 1946/05/07 Today's Date: 05/08/2017    Time: [phone] 55 minutes  1:1 No c/o pain.  Pt requests to use toilet.  Pt performs gait supervision with RW to bathroom.  Performs clothing management and hygiene with supervision.  Gait with RW 200' with supervision.  Gait without RW 50' x 2, 20' x2 with close supervision without LOB in home and controlled environments. Pt fatigues quickly without use of RW but is showing improved balance.  Discussed with pt and daughter possibility of using RW for community and ambulation without AD at home.  Standing therex hip abd, ext, HS curl, mini squats all 2 x 10.  nustep x 10 minutes level 4 for UE/LE endurance. Pt states he feels fine, just "tired".  Time: 1300-1340 40 minutes  1:1 No c/o pain.  Gait without RW with supervision 180' x 2.  Step ups to 4'' step for strength and balance 3 x 10 steps with min A for balance.  Standing ball kick with min A for balance when standing on Rt LE, supervision for balance when standing on Lt LE.  Standing ball toss with supervision.  Stair negotiation for LE strengthening with1 handrail with close supervision, bilat LE weakness noted with descending stairs.   Cedrick Partain 05/08/2017, 10:40 AM

## 2017-05-08 NOTE — Progress Notes (Signed)
[place] PHYSICAL MEDICINE & REHABILITATION     PROGRESS NOTE  Subjective/Complaints:  Pt seen sitting up in bed this AM.  He slept well overnight.  Limited communication due to language.  No family at bedside.   ROS: Denies CP, SOB, N/V/D.  Objective: Vital Signs: Blood pressure 91/67, pulse 79, temperature 97.9 F (36.6 C), temperature source Oral, resp. rate 18, height 6' (1.829 m), weight 84 kg (185 lb 3 oz), SpO2 99 %. Dg Chest 2 View  Result Date: 05/06/2017 CLINICAL DATA:  Shortness of breath EXAM: CHEST  2 VIEW COMPARISON:  04/27/2017 FINDINGS: Post sternotomy changes. Cardiomegaly. Mild increased opacity posteriorly on the lateral view. Trace pleural effusions. Overall improved aeration since the 04/27/2017 study. IMPRESSION: 1. Cardiomegaly 2. Increased posterior basilar opacity could reflect residual edema or infiltrate with overall improved aeration of the lungs since the comparison radiograph. Trace pleural effusions. Electronically Signed   By: Jasmine Pang M.D.   On: 05/06/2017 21:49    Recent Labs  05/07/17 0523 05/08/17 0500  WBC 7.5 7.0  HGB 12.4* 12.0*  HCT 34.5* 32.9*  PLT 283 260    Recent Labs  05/05/17 1928  NA 132*  K 4.3  CL 97*  GLUCOSE 179*  BUN 32*  CREATININE 1.39*  CALCIUM 9.7   CBG (last 3)   Recent Labs  05/06/17 2126 05/07/17 0630 05/07/17 1206  GLUCAP 188* 212* 208*    Wt Readings from Last 3 Encounters:  05/08/17 84 kg (185 lb 3 oz)  05/05/17 79.6 kg (175 lb 6.4 oz)  04/23/17 85.5 kg (188 lb 6.4 oz)    Physical Exam:  BP 91/67 (BP Location: Left Arm)   Pulse 79   Temp 97.9 F (36.6 C) (Oral)   Resp 18   Ht 6' (1.829 m)   Wt 84 kg (185 lb 3 oz)   SpO2 99%   BMI 25.12 kg/m  Constitutional: He appears well-developed and well-nourished.  HENT: Normocephalic and atraumatic.  Eyes: EOM are normal. No discharge.  Cardiovascular: RRR.  No JVD. Respiratory: Effort normal and breath sounds normal.  GI: Bowel sounds are  normal. He exhibits no distension.  Musculoskeletal: He exhibits tenderness. He exhibits no edema.  Neurological: He is alert.  Patient makes eye contact with examiner.  Limited English-speaking.  Follows simple commands.  RUE 4+/5 (stable) LUE 4+/5 prox to distal. (stable) RLE: 4/5 proximal to distal LLE: 4-4+/5 prox to distal.   Skin: Skin is warm and dry.  Staples to RLE c/d/i Psychiatric:  Unable to assess due to language   Assessment/Plan: 1. 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 Physiatrist is providing close team supervision and 24 hour management of active medical problems listed below. Physiatrist and rehab team continue to assess barriers to discharge/monitor patient progress toward functional and medical goals.  Function:  Bathing Bathing position   Position: Wheelchair/chair at sink  Bathing parts Body parts bathed by patient: Right arm, Left arm, Chest, Abdomen, Front perineal area, Buttocks, Right upper leg, Left upper leg, Right lower leg, Left lower leg Body parts bathed by helper: Back  Bathing assist Assist Level: Touching or steadying assistance(Pt > 75%)      Upper Body Dressing/Undressing Upper body dressing   What is the patient wearing?: Pull over shirt/dress     Pull over shirt/dress - Perfomed by patient: Put head through opening, Thread/unthread right sleeve, Thread/unthread left sleeve,  Pull shirt over trunk          Upper body assist Assist Level: Set up      Lower Body Dressing/Undressing Lower body dressing   What is the patient wearing?: Pants, Non-skid slipper socks     Pants- Performed by patient: Thread/unthread right pants leg, Thread/unthread left pants leg, Pull pants up/down   Non-skid slipper socks- Performed by patient: Don/doff right sock, Don/doff left sock                    Lower body assist  Assist for lower body dressing: Touching or steadying assistance (Pt > 75%)      Toileting Toileting   Toileting steps completed by patient: Adjust clothing prior to toileting, Performs perineal hygiene, Adjust clothing after toileting   Toileting Assistive Devices: Grab bar or rail  Toileting assist Assist level: Touching or steadying assistance (Pt.75%)   Transfers Chair/bed transfer   Chair/bed transfer method: Ambulatory Chair/bed transfer assist level: Touching or steadying assistance (Pt > 75%) Chair/bed transfer assistive device: Armrests     Locomotion Ambulation     Max distance: 50 Assist level: Touching or steadying assistance (Pt > 75%)   Wheelchair   Type: Manual Max wheelchair distance: 15 ft Assist Level: Touching or steadying assistance (Pt > 75%)  Cognition Comprehension Comprehension assist level: Understands basic 90% of the time/cues < 10% of the time  Expression Expression assist level: Expresses basic 90% of the time/requires cueing < 10% of the time.  Social Interaction Social Interaction assist level: Interacts appropriately 90% of the time - Needs monitoring or encouragement for participation or interaction.  Problem Solving Problem solving assist level: Solves basic 90% of the time/requires cueing < 10% of the time  Memory Memory assist level: Requires cues to use assistive device    Medical Problem List and Plan: 1.  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  DVT Prophylaxis/Anticoagulation: Heparin ggt bridge to Coumadin therapy. Monitor for any bleeding episodes  INR therapeutic 9/20 3. Pain Management: Tylenol as needed  Gabapentin 100 TID started 9/18 4. Mood: Provide Emotional support 5. Neuropsych: This patient is capable of making decisions on his own behalf. 6. Skin/Wound Care: Routine skin checks 7.  Fluids/Electrolytes/Nutrition: Routine I&Os 8. 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 No chest pain or shortness of breath. Continue aspirin therapy. Follow-up cardiology services 9. Diabetes mellitus with peripheral neuropathy. Hemoglobin A1c 8.8-.SSI. Check blood sugars before meals and at bedtime. Patient on Lantus insulin 5 units daily at bedtime as well as Glucophage 1000 mg daily prior to admission.    Lantus 5 unit qhs started 9/19  Elevated, consider further increase tomorrow 10. Systolic congestive heart failure. Lasix 80 mg daily. Monitor for any signs of fluid overload Filed Weights   05/06/17 0357 05/07/17 0616 05/08/17 0503  Weight: 79.2 kg (174 lb 11.2 oz) 79.5 kg (175 lb 2.5 oz) 84 kg (185 lb 3 oz)  11. Hypertension. Aldactone 50 mg daily  Running low, Cozaar 50 decreased to  on 9/20 12. Hyperlipidemia. Lipitor 13. Acute on chronic anemia.   Hb 12.0 on 9/20  Cont to monitor 14. Questionable medical compliance. Counseling 15. Hyponatremia  Na+ 132 on 9/17  Labs ordered for tomorrow  Cont to monitor 16. AKI  Cr 1.39 on 9/17  Labs ordered for tomorrow  Encourage fluids 17. Intermittent SOB  CXR reviewed showing some edema  Cont lasix  LOS (Days) 3 A FACE TO FACE EVALUATION WAS PERFORMED  Shaynah Hund Karis Juba 05/08/2017 8:35 AM

## 2017-05-08 NOTE — Progress Notes (Signed)
ANTICOAGULATION CONSULT NOTE - Follow up Consult  Pharmacy Consult for Heparin and Warfarin Indication: stroke and SFA thrombus  No Known Allergies  Patient Measurements: Height: 6' (182.9 cm) Weight: 185 lb 3 oz (84 kg) IBW/kg (Calculated) : 77.6 Heparin Dosing Weight: 87.7kg  Vital Signs: Temp: 97.9 F (36.6 C) (09/20 0503) Temp Source: Oral (09/20 0503) BP: 91/67 (09/20 0503) Pulse Rate: 79 (09/20 0503)  Labs:  Recent Labs  05/05/17 1928  05/06/17 0803 05/06/17 2032 05/07/17 0523 05/08/17 0500  HGB 12.5*  --   --   --  12.4* 12.0*  HCT 34.4*  --   --   --  34.5* 32.9*  PLT 274  --   --   --  283 260  LABPROT  --   --  21.1*  --  21.2* 24.0*  INR  --   --  1.84  --  1.85 2.17  HEPARINUNFRC  --   < > 0.64 0.51 0.48 0.34  CREATININE 1.39*  --   --   --   --   --   < > = values in this interval not displayed.  Estimated Creatinine Clearance: 54.3 mL/min (A) (by C-G formula based on SCr of 1.39 mg/dL (H)).   Assessment: 14 yom with recent acute stroke s/p TPA (9/7) with SFA thrombus. 9/7 s/p right femoropopliteal thromboembolectomy and right below the knee popliteal artery endarterectomy. Continues on IV heparin bridge to coumadin. Heparin level 0.34 on 1000 units/hr, INR 2.17. Coumadin started on 04/28/17 First day of therapeutic INR.  Will need INR >2 for >24 hours before we discontinue IV heparin . Hgb 12.0, pltc 260k. No bleeding noted.    9/10 MRI without hemorrhagic conversion  Goal of Therapy:  INR 2-3 Heparin level 0.3-0.5 units/ml Monitor platelets by anticoagulation protocol: Yes   Plan:  - Continue heparin 1000 units/hr - Warfarin  tonight - Daily heparin level, INR, CBC   Noah Delaine, RPh Clinical Pharmacist Pager: [phone] 05/08/2017 8:12 AM

## 2017-05-08 NOTE — Plan of Care (Signed)
Problem: RH Ambulation Goal: LTG Patient will ambulate in community environment (PT) LTG: Patient will ambulate in community environment, # of feet with assistance (PT).  Outcome: Not Applicable Date Met: 62/83/66 n/a

## 2017-05-08 NOTE — Progress Notes (Signed)
Physical Therapy Note  Patient Details  Name: Houston Zapien MRN: [id number] Date of Birth: 02-15-1946 Today's Date: 05/08/2017  0800-0830, 30 min individual tx Pain:none per pt  Pt's dtr not present to translate.  Pt used some [place] and some [place] words to attempt to communicate.  He conveyed that he had difficulty breathing last night.  PT conveyed to Gavin Pound, Charity fundraiser.  Pt sat bedside to eat breakfast, using L hand spontaneously and successfully to cut up food.  He coughed a few times during eating, but declined liquids. He needed help opening containers.  Gait with RW on level tile x 50'.  Pt left resting in bed, alarm set and all needs within reach.  See function navigator for current status.  Lizzet Hendley 05/08/2017, 7:50 AM

## 2017-05-08 NOTE — Progress Notes (Signed)
Occupational Therapy Session Note  Patient Details  Name: [name] MRN: [id number] Date of Birth: May 19, 1946  Today's Date: 05/08/2017 OT Individual Time: 7829-5621 OT Individual Time Calculation (min): 54 min    Short Term Goals: Week 1:  OT Short Term Goal 1 (Week 1): STG = LTGs due to ELOS  Skilled Therapeutic Interventions/Progress Updates:    Treatment session with focus on increased use of LUE in table top tasks.  Pt reports SOB over night, resolving spontaneously, and no pain.  Pt declined bathing this session attributing cultural reasoning (will ask for male therapist).  Utilized tele interpreter throughout session.  Completed stand pivot transfer bed > recliner with min guard.  Engaged in table top task with focus on problem solving and utilization of LUE.  Utilized LUE as gross assist during task, requiring mod cues for problem solving and sequencing of task.  Pt continues to require increased time and cues, ?cognitive impairments vs cultural/language barrier.  Pt reports increased fatigue, assessed vitals 92/63.  Left semi-reclined in recliner with all needs in reach.  Therapy Documentation Precautions:  Precautions Precautions: Fall Precaution Comments: Rt post op shoe - pt refused use.  Restrictions Weight Bearing Restrictions: No Pain:  Pt with no c/o pain  See Function Navigator for Current Functional Status.   Therapy/Group: Individual Therapy  Rosalio Loud 05/08/2017, 11:35 AM

## 2017-05-09 ENCOUNTER — Inpatient Hospital Stay (HOSPITAL_COMMUNITY): Payer: Self-pay | Admitting: Physical Therapy

## 2017-05-09 ENCOUNTER — Inpatient Hospital Stay (HOSPITAL_COMMUNITY): Payer: Self-pay | Admitting: Occupational Therapy

## 2017-05-09 LAB — CBC WITH DIFFERENTIAL/PLATELET
BASOS PCT: 1 %
Basophils Absolute: 0.1 10*3/uL (ref 0.0–0.1)
EOS PCT: 3 %
Eosinophils Absolute: 0.3 10*3/uL (ref 0.0–0.7)
HEMATOCRIT: 32.8 % — AB (ref 39.0–52.0)
HEMOGLOBIN: 11.8 g/dL — AB (ref 13.0–17.0)
LYMPHS PCT: 44 %
Lymphs Abs: 3.6 10*3/uL (ref 0.7–4.0)
MCH: 25.2 pg — ABNORMAL LOW (ref 26.0–34.0)
MCHC: 36 g/dL (ref 30.0–36.0)
MCV: 70.1 fL — AB (ref 78.0–100.0)
MONOS PCT: 10 %
Monocytes Absolute: 0.9 10*3/uL (ref 0.1–1.0)
NEUTROS PCT: 42 %
Neutro Abs: 3.6 10*3/uL (ref 1.7–7.7)
PLATELETS: 256 10*3/uL (ref 150–400)
RBC: 4.68 MIL/uL (ref 4.22–5.81)
RDW: 14.9 % (ref 11.5–15.5)
WBC: 8.5 10*3/uL (ref 4.0–10.5)

## 2017-05-09 LAB — GLUCOSE, CAPILLARY
GLUCOSE-CAPILLARY: 239 mg/dL — AB (ref 65–99)
GLUCOSE-CAPILLARY: 252 mg/dL — AB (ref 65–99)
GLUCOSE-CAPILLARY: 172 mg/dL — AB (ref 65–99)
GLUCOSE-CAPILLARY: 236 mg/dL — AB (ref 65–99)
Glucose-Capillary: 279 mg/dL — ABNORMAL HIGH (ref 65–99)
Glucose-Capillary: 343 mg/dL — ABNORMAL HIGH (ref 65–99)

## 2017-05-09 LAB — HEPARIN LEVEL (UNFRACTIONATED)
HEPARIN UNFRACTIONATED: 0.33 [IU]/mL (ref 0.30–0.70)
Heparin Unfractionated: 1.8 IU/mL — ABNORMAL HIGH (ref 0.30–0.70)

## 2017-05-09 LAB — PROTIME-INR
INR: 2.19
Prothrombin Time: 24.2 seconds — ABNORMAL HIGH (ref 11.4–15.2)

## 2017-05-09 MED ORDER — WARFARIN SODIUM 7.5 MG PO TABS
7.5000 mg | ORAL_TABLET | Freq: Once | ORAL | Status: AC
  Administered 2017-05-09: 7.5 mg via ORAL
  Filled 2017-05-09: qty 1

## 2017-05-09 NOTE — Progress Notes (Signed)
ANTICOAGULATION CONSULT NOTE - Follow up Consult  Pharmacy Consult for Heparin and Warfarin Indication: stroke and SFA thrombus  No Known Allergies  Patient Measurements: Height: 6' (182.9 cm) Weight: 189 lb 6 oz (85.9 kg) IBW/kg (Calculated) : 77.6 Heparin Dosing Weight: 87.7kg  Vital Signs: Temp: 98.5 F (36.9 C) (09/21 0440) Temp Source: Oral (09/21 0440) BP: 110/59 (09/21 0440) Pulse Rate: 79 (09/21 0440)  Labs:  Recent Labs  05/07/17 0523 05/08/17 0500 05/09/17 0428 05/09/17 0726  HGB 12.4* 12.0* 11.8*  --   HCT 34.5* 32.9* 32.8*  --   PLT 283 260 256  --   LABPROT 21.2* 24.0* 24.2*  --   INR 1.85 2.17 2.19  --   HEPARINUNFRC 0.48 0.34 1.80* 0.33    Estimated Creatinine Clearance: 54.3 mL/min (A) (by C-G formula based on SCr of 1.39 mg/dL (H)).  Medications:  Heparin @ 1400 units/hr  Assessment: 71 yom with recent acute stroke and subsequent SFA thrombus. Now on heparin bridge to coumadin. Heparin level 0.33 and INR 2.19 this morning.   9/10 MRI without hemorrhagic conversion  Goal of Therapy:  INR 2-3 Monitor platelets by anticoagulation protocol: Yes   Plan:  - D/c IV heparin since INR is > 2 for > 24 hrs - Daily PT/INR  Bayard Hugger, PharmD, BCPS  Clinical Pharmacist  Pager: [phone]    05/09/2017 1:13 PM

## 2017-05-09 NOTE — Progress Notes (Signed)
Social Work Patient ID: [name], male   DOB: 08-20-1945, 71 y.o.   MRN: [id number]   Alerted by therapies today that they feel pt's d/c date could be moved up earlier and recommended change to 9/24.  MD aware and in agreement.  Pt and daughter made aware and also in agreement.  Discussed follow up therapy and have arranged with [facility].  Continue to follow.  [name], LCSW

## 2017-05-09 NOTE — Care Management (Signed)
Inpatient Rehabilitation Center Individual Statement of Services  Patient Name:  [name]  Date:  05/08/2017  Welcome to the Inpatient Rehabilitation Center.  Our goal is to provide you with an individualized program based on your diagnosis and situation, designed to meet your specific needs.  With this comprehensive rehabilitation program, you will be expected to participate in at least 3 hours of rehabilitation therapies Monday-Friday, with modified therapy programming on the weekends.  Your rehabilitation program will include the following services:  Physical Therapy (PT), Occupational Therapy (OT), 24 hour per day rehabilitation nursing, Case Management (Social Worker), Rehabilitation Medicine, Nutrition Services and Pharmacy Services  Weekly team conferences will be held on Wednesdays to discuss your progress.  Your Social Worker will talk with you frequently to get your input and to update you on team discussions.  Team conferences with you and your family in attendance may also be held.  Expected length of stay: 8-10 days  Overall anticipated outcome: supervision  Depending on your progress and recovery, your program may change. Your Social Worker will coordinate services and will keep you informed of any changes. Your Social Worker's name and contact numbers are listed  below.  The following services may also be recommended but are not provided by the Inpatient Rehabilitation Center:   Driving Evaluations  Home Health Rehabiltiation Services  Outpatient Rehabilitation Services    Arrangements will be made to provide these services after discharge if needed.  Arrangements include referral to agencies that provide these services.  Your insurance has been verified to be:  None Your primary doctor is:  [facility]  Pertinent information will be shared with your doctor and your insurance company.  Social Worker:  [place], [place] [phone] or ([phone]    Information discussed with and copy given to patient by: Amada Jupiter, 9/210/2018, 4:42 PM

## 2017-05-09 NOTE — Progress Notes (Signed)
Occupational Therapy Session Note  Patient Details  Name: [name] MRN: [id number] Date of Birth: 1946/02/03  Today's Date: 05/09/2017 OT Individual Time: 0930-1030 OT Individual Time Calculation (min): 60 min    Short Term Goals: Week 1:  OT Short Term Goal 1 (Week 1): STG = LTGs due to ELOS  Skilled Therapeutic Interventions/Progress Updates:    Treatment session with focus on functional mobility, dynamic standing balance, and functional use of LUE.  Pt received supine in bed with daughter present to provide interpretation.  Pt's daughter reports she assisted him with bathing yesterday.  Discussed current goals and recommendation for supervision with bathing/dressing.  Pt declined bathing/dressing this session.  Ambulated to toilet with supervision without AD, completing toileting tasks with supervision.  Ambulated ~200' without AD with supervision, min cues for Lt awareness as pt fatigued with increased veering to Lt.  Engaged in trunk rotation and reaching with LUE outside BOS to increase functional use of nondominant LUE.  Increased challenge to standing on foam pad with pt requiring min guard when reaching outside BOS.  Engaged in bending to retrieve items from floor with pt reporting fatigue and "heavy head" with bending.  Vitals assessed 109/65.  Engaged in ball toss with focus on increased use of LUE with bimanual catching and throwing task with min guard.  Pt passed off to primary PT.   Therapy Documentation Precautions:  Precautions Precautions: Fall Precaution Comments: Rt post op shoe - pt refused use.  Restrictions Weight Bearing Restrictions: No Pain: Pain Assessment Pain Assessment: No/denies pain  See Function Navigator for Current Functional Status.   Therapy/Group: Individual Therapy  Rosalio Loud 05/09/2017, 11:43 AM

## 2017-05-09 NOTE — Progress Notes (Addendum)
Physical Therapy Note  Patient Details  Name: Fabrice Dyal MRN: [id number] Date of Birth: 04-19-1946 Today's Date: 05/09/2017    Time: 1030-1130 60 minutes  1:1 No c/o pain.  Nustep per pt request x 12 minutes level 4 for UE/LE strength and endurance.  Gait with and without RW outdoors. Both with supervision up to 50'. Pt states he prefers without RW outdoors. Pt's daughter educated to provide close supervision for gait outdoors and in community without RW.  Pt performed gait in controlled environment x 100' with supervision without AD.  Pt provided with HEP for standing LE exercises. Pt verbalizes understanding. Pt too fatigued to complete therex now, states he will perform exercise program in afternoon session. Pt missed 30 minutes skilled PT due to fatigue.   Time 2: 1300-1340 40 minutes  1:1 No c/o pain. Gait throughout unit without AD with supervision.  Stair negotiation x 12 stairs with bilat handrails with supervision.  Standing therex with 2# ankle wts mini squats, HS curls, hip abd, hip ext, marching all 2 x 10.  Step ups to 4'' step without AD x 10 bilat with min A. Reciprocal taps for balance training 2 x 10 bilat with min A.  Stormy Connon 05/09/2017, 11:29 AM

## 2017-05-09 NOTE — Progress Notes (Signed)
PHYSICAL MEDICINE & REHABILITATION     PROGRESS NOTE  Subjective/Complaints:  Up in bed. No new complaints. Appears comfortable  ROS: Limited due to language  Objective: Vital Signs: Blood pressure (!) 110/59, pulse 79, temperature 98.5 F (36.9 C), temperature source Oral, resp. rate 18, height 6' (1.829 m), weight 85.9 kg (189 lb 6 oz), SpO2 100 %. No results found.  Recent Labs  05/08/17 0500 05/09/17 0428  WBC 7.0 8.5  HGB 12.0* 11.8*  HCT 32.9* 32.8*  PLT 260 256   No results for input(s): NA, K, CL, GLUCOSE, BUN, CREATININE, CALCIUM in the last 72 hours.  Invalid input(s): CO CBG (last 3)   Recent Labs  05/07/17 2146 05/08/17 0616 05/08/17 1148  GLUCAP 236* 176* 255*    Wt Readings from Last 3 Encounters:  05/09/17 85.9 kg (189 lb 6 oz)  05/05/17 79.6 kg (175 lb 6.4 oz)  04/23/17 85.5 kg (188 lb 6.4 oz)    Physical Exam:  BP (!) 110/59 (BP Location: Left Arm)   Pulse 79   Temp 98.5 F (36.9 C) (Oral)   Resp 18   Ht 6' (1.829 m)   Wt 85.9 kg (189 lb 6 oz)   SpO2 100%   BMI 25.68 kg/m  Constitutional: He appears well-developed and well-nourished.  HENT: Normocephalic and atraumatic.  Eyes: EOM are normal. No discharge.  Cardiovascular: RRR without murmur. No JVD . Respiratory: CTA Bilaterally without wheezes or rales. Normal effort   GI: Bowel sounds are normal. He exhibits no distension.  Musculoskeletal: He exhibits tenderness. He exhibits no edema.  Neurological: He is alert.  Patient makes eye contact with examiner.  Limited English-speaking.  Follows simple commands.  RUE 4+/5 (stable) LUE 4+/5 prox to distal. No changes RLE: 4/5 proximal to distal LLE: 4-4+/5 prox to distal.   Skin: Skin is warm and dry.  Staples to RLE clean/dry Psychiatric:  Pleasant and cooperative   Assessment/Plan: 1. 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 Physiatrist is providing close team supervision and 24 hour management of active medical problems listed below. Physiatrist and rehab team continue to assess barriers to discharge/monitor patient progress toward functional and medical goals.  Function:  Bathing Bathing position   Position: Wheelchair/chair at sink  Bathing parts Body parts bathed by patient: Right arm, Left arm, Chest, Abdomen, Front perineal area, Buttocks, Right upper leg, Left upper leg, Right lower leg, Left lower leg Body parts bathed by helper: Back  Bathing assist Assist Level: Touching or steadying assistance(Pt > 75%)      Upper Body Dressing/Undressing Upper body dressing   What is the patient wearing?: Pull over shirt/dress     Pull over shirt/dress - Perfomed by patient: Put head through opening, Thread/unthread right sleeve, Thread/unthread left sleeve, Pull shirt over trunk          Upper body assist Assist Level: Set up      Lower Body Dressing/Undressing Lower body dressing   What is the patient wearing?: Pants, Non-skid slipper socks     Pants- Performed by patient: Thread/unthread right pants leg, Thread/unthread left pants leg, Pull pants up/down   Non-skid slipper socks- Performed by patient: Don/doff right sock, Don/doff left sock                    Lower body assist Assist for lower body dressing: Touching or steadying assistance (Pt >  75%)      Toileting Toileting   Toileting steps completed by patient: Adjust clothing prior to toileting, Performs perineal hygiene, Adjust clothing after toileting   Toileting Assistive Devices: Grab bar or rail  Toileting assist Assist level: Touching or steadying assistance (Pt.75%)   Transfers Chair/bed transfer   Chair/bed transfer method: Ambulatory Chair/bed transfer assist level: Touching or steadying assistance (Pt > 75%) Chair/bed transfer assistive device: Armrests      Locomotion Ambulation     Max distance: 50 Assist level: Touching or steadying assistance (Pt > 75%)   Wheelchair   Type: Manual Max wheelchair distance: 15 ft Assist Level: Touching or steadying assistance (Pt > 75%)  Cognition Comprehension Comprehension assist level: Follows basic conversation/direction with extra time/assistive device  Expression Expression assist level: Expresses basic needs/ideas: With extra time/assistive device  Social Interaction Social Interaction assist level: Interacts appropriately with others with medication or extra time (anti-anxiety, antidepressant).  Problem Solving Problem solving assist level: Solves basic 90% of the time/requires cueing < 10% of the time  Memory Memory assist level: More than reasonable amount of time    Medical Problem List and Plan: 1.  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  DVT Prophylaxis/Anticoagulation: Heparin ggt bridge to Coumadin therapy. Monitor for any bleeding episodes  INR therapeutic 9/21 3. Pain Management: Tylenol as needed  Gabapentin 100 TID started 9/18--appears to be helping 4. Mood: Provide Emotional support 5. Neuropsych: This patient is capable of making decisions on his own behalf. 6. Skin/Wound Care: Routine skin checks 7. Fluids/Electrolytes/Nutrition: Routine I&Os 8. 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 No chest pain or shortness of breath. Continue aspirin therapy. Follow-up cardiology services 9. Diabetes mellitus with peripheral neuropathy. Hemoglobin A1c 8.8-.SSI. Check blood sugars before meals and at bedtime. Patient on Lantus insulin 5 units daily at bedtime as well as Glucophage 1000 mg daily prior to admission.    Lantus 5 unit qhs started 9/19---increase to 10u tonight 10. Systolic congestive heart failure. Lasix 80 mg daily. Monitor for any signs of fluid overload Filed Weights    05/07/17 0616 05/08/17 0503 05/09/17 0440  Weight: 79.5 kg (175 lb 2.5 oz) 84 kg (185 lb 3 oz) 85.9 kg (189 lb 6 oz)  11. Hypertension. Aldactone 50 mg daily  Some improvement of hypotension after Cozaar 50 decreased to  on 9/20 12. Hyperlipidemia. Lipitor 13. Acute on chronic anemia.   Hb 12.0 on 9/20--11.8 9/21  Cont to monitor 14. Questionable medical compliance. Counseling 15. Hyponatremia  Na+ 132 on 9/17  Labs pending for today  Cont to monitor 16. AKI  Cr 1.39 on 9/17  Labs pending  Encourage fluids 17. Intermittent SOB  CXR with mild edema  Cont lasix  LOS (Days) 4 A FACE TO FACE EVALUATION WAS PERFORMED  Kaoir Loree T 05/09/2017 9:15 AM

## 2017-05-09 NOTE — Progress Notes (Signed)
Social Work  Social Work Assessment and Plan  Patient Details  Name: [name] MRN: [id number] Date of Birth: 09-22-1945  Today's Date: 05/08/2017  Problem List:  Patient Active Problem List   Diagnosis Date Noted  . Shortness of breath   . Diabetes mellitus type 2 in nonobese ([name])   . Subtherapeutic anticoagulation   . Neuropathic pain   . AKI (acute kidney injury) ([name])   . 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 Coronary artery disease involving coronary bypass graft of native heart without angina pectoris   . Type 2 diabetes mellitus with peripheral neuropathy ([name])   . Benign essential HTN   . Hyperlipidemia   . Acute blood loss anemia   . Anemia of chronic disease   . History of medication noncompliance   . Ischemia of right lower extremity   . Endotracheally intubated   . Ischemia of left lower extremity   . Respiratory failure ([name])   . Occlusion of left carotid artery   . Urinary retention   . Ischemic cardiomyopathy   . S/P CABG (coronary artery bypass graft)   . Cerebral embolism with cerebral infarction 04/25/2017  . Acute on chronic systolic heart failure ([name]) 04/24/2017  . Hyponatremia 04/24/2017  . CHF (congestive heart failure) ([name]) 04/17/2017   Past Medical History:  Past Medical History:  Diagnosis Date  . CAD (coronary artery disease) 2007   CABG 2007 in [place]  . CHF (congestive heart failure) ([name])   . Diabetes mellitus without complication ([name])   . Hyperlipidemia   . Hypertension   . Pulmonary embolism ([name])   . Stroke [facility])    Past Surgical History:  Past Surgical History:  Procedure Laterality Date  . CARDIAC SURGERY    . CYSTOSCOPY N/A 04/25/2017   Procedure: CYSTOSCOPY FLEXIBLE;  Surgeon: Fransisco Hertz, MD;  Location: [facility] OR;  Service: Vascular;  Laterality: N/A;  . EMBOLECTOMY Right 04/25/2017   Procedure: EMBOLECTOMY RIGHT FEMORAL ARTERY;  Surgeon: Fransisco Hertz, MD;  Location: [facility] OR;  Service: Vascular;   Laterality: Right;  . IR PERCUTANEOUS ART THROMBECTOMY/INFUSION INTRACRANIAL INC DIAG ANGIO  04/25/2017  . PATCH ANGIOPLASTY Right 04/25/2017   Procedure: PATCH ANGIOPLASTY USING Livia Snellen BIOLOGIC PATCH;  Surgeon: Fransisco Hertz, MD;  Location: [facility] OR;  Service: Vascular;  Laterality: Right;  . PROSTATE SURGERY  2002  . RADIOLOGY WITH ANESTHESIA N/A 04/24/2017   Procedure: RADIOLOGY WITH ANESTHESIA Code Stroke;  Surgeon: Gilmer Mor, DO;  Location: [facility] OR;  Service: Anesthesiology;  Laterality: N/A;   Social History:  reports that he has never smoked. He has never used smokeless tobacco. He reports that he does not drink alcohol or use drugs.  Family / Support Systems Marital Status: Married Patient Roles: Spouse, Parent Children: daughter, [name] @ (C) [phone] and son-in-law, [name] @ (C) [phone] Anticipated Caregiver: Daughter Ability/Limitations of Caregiver: Daughter has taken up to 6 weeks FMLA from work Caregiver Availability: 24/7 Family Dynamics: Daughter has been staying with pt at hospital and is very involved and supportive  Social History Preferred language: [place] Religion:  Cultural Background: Pt originally from [place] - wife still living there. Read: Yes (native language) Employment Status: Retired Fish farm manager Issues: None Guardian/Conservator: None - per MD, pt is capable of making decisions on his own behalf   Abuse/Neglect Physical Abuse: Denies Verbal Abuse: Denies Sexual Abuse: Denies Exploitation of patient/patient's resources: Denies Self-Neglect: Denies  Emotional Status Pt's affect, behavior adn adjustment status:  Pt lying in bed and allows daughter to answer questions to complete interview.  He does not appear to be in any distress.  Daughter denies any concerns for pt - will monitor. Recent Psychosocial Issues: None Pyschiatric History: None Substance Abuse History: None  Patient / Family Perceptions, Expectations &  Goals Pt/Family understanding of illness & functional limitations: Daughter with basic understanding of pt's CVA and current functional limitations/ need for CIR. Premorbid pt/family roles/activities: pt active at home and in community Anticipated changes in roles/activities/participation: limited change if pt able to readh mod ind/ supervision goals Pt/family expectations/goals: "We want him stronger."  [employer]: Other (Comment) (followed at [facility]) Premorbid Home Care/DME Agencies: None Transportation available at discharge: yes  Discharge Planning Living Arrangements: Children Support Systems: Children Type of Residence: Private residence Insurance Resources: Customer service manager Resources: Family Support Financial Screen Referred: Previously completed Living Expenses: Lives with family Money Management: Family Does the patient have any problems obtaining your medications?: No (via [facility]) Home Management: daughter Patient/Family Preliminary Plans: Pt to return home with daughter and her family.  Daughter has taken time off from work to assist pt. Social Work Anticipated Follow Up Needs: HH/OP Expected length of stay: 8-11 days  Clinical Impression Unfortunate gentleman here following a CVA - originally from [place] and here living with his daughter.  Daughter staying at hospital most of the time and very supportive.  She is prepared to take time from work to assist her father as needed.  No emotional distress noted - will monitor.  Will follow for support and d/c planning.  [name] 05/08/2017, 4:39 PM

## 2017-05-09 NOTE — Patient Care Conference (Signed)
Inpatient RehabilitationTeam Conference and Plan of Care Update Date: 05/07/2017   Time: 2:45 PM    Patient Name: [name]      Medical Record Number: [id number]  Date of Birth: 04-24-1946 Sex: Male         Room/Bed: 4W01C/4W01C-01 Payor Info: Payor: MEDICAID POTENTIAL / Plan: MEDICAID POTENTIAL / Product Type: *No Product type* /    Admitting Diagnosis: R CVA  Admit Date/Time:  05/05/2017  6:33 PM Admission Comments: No comment available   Primary Diagnosis:  <principal problem not specified> Principal Problem: <principal problem not specified>  Patient Active Problem List   Diagnosis Date Noted  . Shortness of breath   . Diabetes mellitus type 2 in nonobese ([name])   . Subtherapeutic anticoagulation   . Neuropathic pain   . AKI (acute kidney injury) ([name])   . 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 Coronary artery disease involving coronary bypass graft of native heart without angina pectoris   . Type 2 diabetes mellitus with peripheral neuropathy ([name])   . Benign essential HTN   . Hyperlipidemia   . Acute blood loss anemia   . Anemia of chronic disease   . History of medication noncompliance   . Ischemia of right lower extremity   . Endotracheally intubated   . Ischemia of left lower extremity   . Respiratory failure ([name])   . Occlusion of left carotid artery   . Urinary retention   . Ischemic cardiomyopathy   . S/P CABG (coronary artery bypass graft)   . Cerebral embolism with cerebral infarction 04/25/2017  . Acute on chronic systolic heart failure ([name]) 04/24/2017  . Hyponatremia 04/24/2017  . CHF (congestive heart failure) ([name]) 04/17/2017    Expected Discharge Date: Expected Discharge Date: 05/14/17  Team Members Present: Physician leading conference: Dr. [name] Social Worker Present: Amada Jupiter, LCSW Nurse Present: Allayne Stack, RN PT Present: Wanda Plump, PT OT Present: Rosalio Loud, OT PPS Coordinator present : Tora Duck, RN,  CRRN     Current Status/Progress Goal Weekly Team Focus  Medical   Left side weakness secondary to right insular infarction after right lower extremity thromboembolectomy/endarterectomy for ischemic occlusion  Improve mobility, safety, neuropathic pain, DM  See above   Bowel/Bladder   Continent of B&B  Maintain continence  Assisst with toileting needs.   Swallow/Nutrition/ Hydration             ADL's   Min assist  Mod I overall, supervision shower transfers  LUE NMR, ADL retraining, transfers, dynamic standing balance, pt/family education   Mobility   min assist transfers and gait x 50'  modified independent basic tranfers, supervision car, modified independent gait x 50' in controlled env, and 25' home,supervision gait x 200' in community, min assist 12 steps  neuro re-ed, activity tolerance, safety, gait training, family ed   Communication             Safety/Cognition/ Behavioral Observations            Pain   Occassional toe pain from 4-7.  <3  Assess for pain, treat, and reassess   Skin   Surgical inscion with staples- clean with no dng and well approximated.          Rehab Goals Patient on target to meet rehab goals: Yes *See Care Plan and progress notes for long and short-term goals.     Barriers to Discharge  Current Status/Progress Possible Resolutions Date Resolved   Physician  Medical stability;Decreased caregiver support;Other (comments)  anticoagulation  See above  Therapies, workup for SOB, neuropathic pain meds, optimize DM meds, transition to oral anticoagulation      Nursing  Wound Care;Medication compliance               PT                    OT                  SLP                SW                Discharge Planning/Teaching Needs:  home with daughter who can provide 24/7 assistance.  Teaching has been ongoing with daughter who is here most of the time.   Team Discussion:  Low BP but asymptomatic.  Amb overall with min assist;  Needs cues  for safety.  Difficult to determine if any cognition goals.  Goals overall mod independent to supervision.  Revisions to Treatment Plan:  None    Continued Need for Acute Rehabilitation Level of Care: The patient requires daily medical management by a physician with specialized training in physical medicine and rehabilitation for the following conditions: Daily direction of a multidisciplinary physical rehabilitation program to ensure safe treatment while eliciting the highest outcome that is of practical value to the patient.: Yes Daily medical management of patient stability for increased activity during participation in an intensive rehabilitation regime.: Yes Daily analysis of laboratory values and/or radiology reports with any subsequent need for medication adjustment of medical intervention for : Other;Diabetes problems;Neurological problems  [name] 05/09/2017, 11:08 AM

## 2017-05-10 ENCOUNTER — Inpatient Hospital Stay (HOSPITAL_COMMUNITY): Payer: Self-pay

## 2017-05-10 DIAGNOSIS — M79671 Pain in right foot: Secondary | ICD-10-CM

## 2017-05-10 LAB — PROTIME-INR
INR: 1.9
PROTHROMBIN TIME: 21.6 s — AB (ref 11.4–15.2)

## 2017-05-10 LAB — GLUCOSE, CAPILLARY
GLUCOSE-CAPILLARY: 221 mg/dL — AB (ref 65–99)
GLUCOSE-CAPILLARY: 226 mg/dL — AB (ref 65–99)
Glucose-Capillary: 166 mg/dL — ABNORMAL HIGH (ref 65–99)
Glucose-Capillary: 242 mg/dL — ABNORMAL HIGH (ref 65–99)

## 2017-05-10 MED ORDER — TRAMADOL HCL 50 MG PO TABS
50.0000 mg | ORAL_TABLET | Freq: Four times a day (QID) | ORAL | Status: DC | PRN
  Administered 2017-05-10 – 2017-05-11 (×3): 50 mg via ORAL
  Filled 2017-05-10 (×3): qty 1

## 2017-05-10 MED ORDER — INSULIN ASPART 100 UNIT/ML ~~LOC~~ SOLN
0.0000 [IU] | Freq: Three times a day (TID) | SUBCUTANEOUS | Status: DC
  Administered 2017-05-10 (×3): 2 [IU] via SUBCUTANEOUS
  Administered 2017-05-11: 4 [IU] via SUBCUTANEOUS
  Administered 2017-05-11 – 2017-05-12 (×2): 3 [IU] via SUBCUTANEOUS

## 2017-05-10 MED ORDER — GABAPENTIN 100 MG PO CAPS
200.0000 mg | ORAL_CAPSULE | Freq: Three times a day (TID) | ORAL | Status: DC
  Administered 2017-05-10 – 2017-05-12 (×7): 200 mg via ORAL
  Filled 2017-05-10 (×7): qty 2

## 2017-05-10 MED ORDER — WARFARIN SODIUM 7.5 MG PO TABS
7.5000 mg | ORAL_TABLET | Freq: Once | ORAL | Status: AC
  Administered 2017-05-10: 7.5 mg via ORAL
  Filled 2017-05-10: qty 1

## 2017-05-10 NOTE — Progress Notes (Signed)
ANTICOAGULATION CONSULT NOTE - Follow up Consult  Pharmacy Consult for Heparin and Warfarin Indication: stroke and SFA thrombus  No Known Allergies  Patient Measurements: Height: 6' (182.9 cm) Weight: 171 lb 14.4 oz (78 kg) IBW/kg (Calculated) : 77.6 Heparin Dosing Weight: 87.7kg  Vital Signs: Temp: 98 F (36.7 C) (09/22 0800) Temp Source: Oral (09/22 0800) BP: 108/70 (09/22 0800) Pulse Rate: 81 (09/22 0800)  Labs:  Recent Labs  05/08/17 0500 05/09/17 0428 05/09/17 0726 05/10/17 0603  HGB 12.0* 11.8*  --   --   HCT 32.9* 32.8*  --   --   PLT 260 256  --   --   LABPROT 24.0* 24.2*  --  21.6*  INR 2.17 2.19  --  1.90  HEPARINUNFRC 0.34 1.80* 0.33  --     Estimated Creatinine Clearance: 54.3 mL/min (A) (by C-G formula based on SCr of 1.39 mg/dL (H)).   Assessment: 46 yom with recent acute stroke and subsequent SFA thrombus. INR this morning is slightly below goal at 1.90. Hemoglobin is low, stable and platelets are within normal limits.    Goal of Therapy:  INR 2-3 Monitor platelets by anticoagulation protocol: Yes   Plan:  Warfarin 7.5mg  x 1 PO  Daily PT/INR Monitor for s/sx of bleeding   Blake Divine, Pharm.D. PGY1 Pharmacy Resident 05/10/2017 11:07 AM Main Pharmacy: [phone]

## 2017-05-10 NOTE — Progress Notes (Signed)
[facility] PHYSICAL MEDICINE & REHABILITATION     PROGRESS NOTE  Subjective/Complaints:  Pain in right foot still. Wakes him up at night. Shooting/sharp  ROS: limited due to language/communication   Objective: Vital Signs: Blood pressure 108/70, pulse 81, temperature 98 F (36.7 C), temperature source Oral, resp. rate 16, height 6' (1.829 m), weight 78 kg (171 lb 14.4 oz), SpO2 100 %. No results found.  Recent Labs  05/08/17 0500 05/09/17 0428  WBC 7.0 8.5  HGB 12.0* 11.8*  HCT 32.9* 32.8*  PLT 260 256   No results for input(s): NA, K, CL, GLUCOSE, BUN, CREATININE, CALCIUM in the last 72 hours.  Invalid input(s): CO CBG (last 3)   Recent Labs  05/09/17 1714 05/09/17 2030 05/10/17 0646  GLUCAP 279* 343* 166*    Wt Readings from Last 3 Encounters:  05/10/17 78 kg (171 lb 14.4 oz)  05/05/17 79.6 kg (175 lb 6.4 oz)  04/23/17 85.5 kg (188 lb 6.4 oz)    Physical Exam:  BP 108/70 (BP Location: Right Arm)   Pulse 81   Temp 98 F (36.7 C) (Oral)   Resp 16   Ht 6' (1.829 m)   Wt 78 kg (171 lb 14.4 oz)   SpO2 100%   BMI 23.31 kg/m  Constitutional: He appears well-developed and well-nourished.  HENT: Normocephalic and atraumatic.  Eyes: EOM are normal. No discharge.  Cardiovascular: RRR without murmur. No JVD  . Respiratory: CTA Bilaterally without wheezes or rales. Normal effort   GI: Bowel sounds are normal. He exhibits no distension.  Musculoskeletal: He exhibits tenderness. He exhibits no edema.  Neurological: He is alert.  Patient makes eye contact with examiner.  Limited English-speaking.  Follows simple commands.  RUE 4+/5 (stable) LUE 4+/5 prox to distal. No changes RLE: 4/5 proximal to distal LLE: 4-4+/5 prox to distal.   Skin: Skin is warm and dry.  Staples to RLE intact 3-5 toes with mild swelling, tender to touch. Foot warm. No obvious breakdown Psychiatric:  Pleasant and cooperative   Assessment/Plan: 1. 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 Physiatrist is providing close team supervision and 24 hour management of active medical problems listed below. Physiatrist and rehab team continue to assess barriers to discharge/monitor patient progress toward functional and medical goals.  Function:  Bathing Bathing position   Position: Wheelchair/chair at sink  Bathing parts Body parts bathed by patient: Right arm, Left arm, Chest, Abdomen, Front perineal area, Buttocks, Right upper leg, Left upper leg, Right lower leg, Left lower leg Body parts bathed by helper: Back  Bathing assist Assist Level: Touching or steadying assistance(Pt > 75%)      Upper Body Dressing/Undressing Upper body dressing   What is the patient wearing?: Pull over shirt/dress     Pull over shirt/dress - Perfomed by patient: Put head through opening, Thread/unthread right sleeve, Thread/unthread left sleeve, Pull shirt over trunk          Upper body assist Assist Level: Set up      Lower Body Dressing/Undressing Lower body dressing   What is the patient wearing?: Pants, Non-skid slipper socks     Pants- Performed by patient: Thread/unthread right pants leg, Thread/unthread left pants leg, Pull pants up/down   Non-skid slipper socks- Performed by patient: Don/doff right sock, Don/doff left sock  Lower body assist Assist for lower body dressing: Touching or steadying assistance (Pt > 75%)      Toileting Toileting   Toileting steps completed by patient: Adjust clothing prior to toileting, Performs perineal hygiene, Adjust clothing after toileting   Toileting Assistive Devices: Grab bar or rail  Toileting assist Assist level: Touching or steadying assistance (Pt.75%)   Transfers Chair/bed transfer   Chair/bed transfer method: Ambulatory Chair/bed transfer assist level: Supervision or verbal  cues Chair/bed transfer assistive device: Armrests     Locomotion Ambulation     Max distance: 100 Assist level: Supervision or verbal cues   Wheelchair   Type: Manual Max wheelchair distance: 15 ft Assist Level: Touching or steadying assistance (Pt > 75%)  Cognition Comprehension Comprehension assist level: Follows complex conversation/direction with extra time/assistive device  Expression Expression assist level: Expresses basic needs/ideas: With extra time/assistive device  Social Interaction Social Interaction assist level: Interacts appropriately with others with medication or extra time (anti-anxiety, antidepressant).  Problem Solving Problem solving assist level: Solves basic problems with no assist  Memory Memory assist level: More than reasonable amount of time    Medical Problem List and Plan: 1.  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  DVT Prophylaxis/Anticoagulation: Heparin ggt bridge to Coumadin therapy. Monitor for any bleeding episodes  INR therapeutic 9/21 3. Pain Management: Tylenol as needed  Gabapentin 100 TID started 9/18--increase to  TID for neuropathic pain  -suspect a lot of pain is due to ischemic insult to right foot prior to surgery. Daughter states pain has been in the foot since his acute admission   -protect foot, add tramadol for more severe pain   -continue to observe foot/toes. Appears viable   -surgery follow up prn 4. Mood: Provide Emotional support 5. Neuropsych: This patient is capable of making decisions on his own behalf. 6. Skin/Wound Care: Routine skin checks 7. Fluids/Electrolytes/Nutrition: Routine I&Os 8. 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 No chest pain or shortness of breath. Continue aspirin therapy. Follow-up cardiology services 9. Diabetes mellitus with peripheral neuropathy. Hemoglobin A1c 8.8-.SSI. Check blood sugars  before meals and at bedtime. Patient on Lantus insulin 5 units daily at bedtime as well as Glucophage 1000 mg daily prior to admission.    Lantus 5 unit qhs started 9/19---increased to 10u qhs on 9/21   -needs SSI during day 10. Systolic congestive heart failure. Lasix 80 mg daily. Monitor for any signs of fluid overload Filed Weights   05/08/17 0503 05/09/17 0440 05/10/17 0700  Weight: 84 kg (185 lb 3 oz) 85.9 kg (189 lb 6 oz) 78 kg (171 lb 14.4 oz)  11. Hypertension. Aldactone 50 mg daily  Some improvement of hypotension after Cozaar 50 decreased to  on 9/20 12. Hyperlipidemia. Lipitor 13. Acute on chronic anemia.   Hb 12.0 on 9/20--11.8 9/21  Cont to monitor 14. Questionable medical compliance. Counseling 15. Hyponatremia  Na+ 132 on 9/17  -recheck monday 16. AKI  Cr 1.39 on 9/17 17. Intermittent SOB  CXR with mild edema  Cont lasix  LOS (Days) 5 A FACE TO FACE EVALUATION WAS PERFORMED  Bernedette Auston T 05/10/2017 8:59 AM

## 2017-05-10 NOTE — Progress Notes (Signed)
Occupational Therapy Session Note  Patient Details  Name: [name] MRN: [id number] Date of Birth: 1946/02/13  Today's Date: 05/10/2017 OT Individual Time: 4098-1191 OT Individual Time Calculation (min): 45 min    Short Term Goals: Week 1:  OT Short Term Goal 1 (Week 1): STG = LTGs due to ELOS  Skilled Therapeutic Interventions/Progress Updates:    1:1. Pt with no c/o pain. Son present to interpret. Pt ambulate to/from tx destination without AD with supervision. Focus of session on FMC, balance and weight shifting during leisure activity. Pt completes palm<>finger translocation with VC to decrease compensatory movements for LUE NMR. Pt threads small beads onto string using LUE to improve [facility] required for ADLs. Pt stands to play soccer and kick soccer ball in between chair legs as target to facilitate weight shifting to L and improve balance with CGA and 1 LOB L with MOD A to recover.Exited session with pt seated in recliner, son and NT present.  Therapy Documentation Precautions:  Precautions Precautions: Fall Precaution Comments: Rt post op shoe - pt refused use.  Restrictions Weight Bearing Restrictions: No  See Function Navigator for Current Functional Status.   Therapy/Group: Individual Therapy  Shon Hale 05/10/2017, 4:46 PM

## 2017-05-11 ENCOUNTER — Inpatient Hospital Stay (HOSPITAL_COMMUNITY): Payer: Self-pay

## 2017-05-11 DIAGNOSIS — I739 Peripheral vascular disease, unspecified: Secondary | ICD-10-CM

## 2017-05-11 LAB — GLUCOSE, CAPILLARY
GLUCOSE-CAPILLARY: 198 mg/dL — AB (ref 65–99)
Glucose-Capillary: 264 mg/dL — ABNORMAL HIGH (ref 65–99)
Glucose-Capillary: 311 mg/dL — ABNORMAL HIGH (ref 65–99)
Glucose-Capillary: 194 mg/dL — ABNORMAL HIGH (ref 65–99)

## 2017-05-11 LAB — PROTIME-INR
INR: 2.04
PROTHROMBIN TIME: 22.9 s — AB (ref 11.4–15.2)

## 2017-05-11 MED ORDER — WARFARIN SODIUM 7.5 MG PO TABS
7.5000 mg | ORAL_TABLET | Freq: Once | ORAL | Status: AC
  Administered 2017-05-11: 7.5 mg via ORAL
  Filled 2017-05-11: qty 1

## 2017-05-11 MED ORDER — LOSARTAN POTASSIUM 50 MG PO TABS
25.0000 mg | ORAL_TABLET | Freq: Every day | ORAL | Status: DC
  Administered 2017-05-12: 25 mg via ORAL
  Filled 2017-05-11: qty 1

## 2017-05-11 MED ORDER — DOCUSATE SODIUM 100 MG PO CAPS
100.0000 mg | ORAL_CAPSULE | Freq: Every day | ORAL | Status: DC
  Administered 2017-05-12: 100 mg via ORAL
  Filled 2017-05-11: qty 1

## 2017-05-11 MED ORDER — INSULIN GLARGINE 100 UNIT/ML ~~LOC~~ SOLN
10.0000 [IU] | Freq: Every day | SUBCUTANEOUS | Status: DC
  Administered 2017-05-11: 10 [IU] via SUBCUTANEOUS
  Filled 2017-05-11 (×2): qty 0.1

## 2017-05-11 MED ORDER — ASPIRIN EC 81 MG PO TBEC
81.0000 mg | DELAYED_RELEASE_TABLET | Freq: Every day | ORAL | Status: DC
  Administered 2017-05-12: 81 mg via ORAL
  Filled 2017-05-11: qty 1

## 2017-05-11 NOTE — Progress Notes (Signed)
Physical Therapy Session Note  Patient Details  Name: [name] MRN: [id number] Date of Birth: 02-23-1946  Today's Date: 05/11/2017 PT Individual Time: 1000-1059, 59 minutes   and Today's Date: 05/11/2017 PT Missed Time: 30 Minutes Missed Time Reason: Patient fatigue;Patient ill (Comment) (nausea)  Short Term Goals: Week 1:  PT Short Term Goal 1 (Week 1): STG + LTG  Skilled Therapeutic Interventions/Progress Updates:   Session 1: Pt supine in bed upon PT arrival, agreeable to therapy tx and denies pain. Pt transferred supine to sitting EOB with supervision and ambulated to the dayroom x 150 ft without AD and with min guard assist. Pt used nustep x 10 minutes on workload 5 for UE/LE strengthening. Pt ambulated from dayroom to the gym x 150 ft without AD with min assist to stabilize for occasional LOB. Session focused on dynamic standing balance: standing on foam 2 x 1 min, standing on foam while matching cards x 2 trials, navigating obstacles to go around cones and step over objects x 2 trials, toe taps outside BOS on colored cups. Pt ambulated back to room x 150 ft with min assist, left supine in bed with needs in reach.    Session 2: Pt missed 30 minutes this session due to nausea and an episode of vomiting just prior to PT arrival.   Therapy Documentation Precautions:  Precautions Precautions: Fall Precaution Comments: Rt post op shoe - pt refused use.  Restrictions Weight Bearing Restrictions: No   See Function Navigator for Current Functional Status.   Therapy/Group: Individual Therapy  Cresenciano Genre, PT, DPT 05/11/2017, 10:15 AM

## 2017-05-11 NOTE — Progress Notes (Signed)
Occupational Therapy Session Note  Patient Details  Name: [name] MRN: [id number] Date of Birth: [birth year]  Today's Date: 05/11/2017 OT Individual Time: 1300-1340 OT Individual Time Calculation (min): 40 min    Short Term Goals: Week 1:  OT Short Term Goal 1 (Week 1): STG = LTGs due to ELOS  Skilled Therapeutic Interventions/Progress Updates:    1:1. Daughter present to translate. RN administering pain medication upon arrival. Pt reporting feeling weak and requesting to complete tx in room. With max VC for choosing correct pieces, pt assembles pipe tree according to picture crossing midline with LUE to obtain pieces and manipulate in hand to improve [facility] and [facility] of LUE required for ADLs. Pt completes 1x15 of exercises with 2# dumbbells in B hands in all planes of motion of shoulder and elbow to improve strength required for BADLs with instructional cues for proper motion. Exited session with pt seated EOB eating with daughter present.  Therapy Documentation Precautions:  Precautions Precautions: Fall Precaution Comments: Rt post op shoe - pt refused use.  Restrictions Weight Bearing Restrictions: No  See Function Navigator for Current Functional Status.   Therapy/Group: Individual Therapy  Shon Hale 05/11/2017, 1:40 PM

## 2017-05-11 NOTE — Progress Notes (Signed)
Occupational Therapy Session Note  Patient Details  Name: [name] MRN: [id number] Date of Birth: Sep 02, 1945  Today's Date: 05/11/2017 OT Individual Time: 1071-2524 OT Individual Time Calculation (min): 57 min    Short Term Goals: Week 1:  OT Short Term Goal 1 (Week 1): STG = LTGs due to ELOS  Skilled Therapeutic Interventions/Progress Updates:    1:1. Pt dons socks with supervision/set up and ambulates to stand over toilet and void urine. Pt ambulates to/from all tx destinations with supervision and Vc to not veer L when fatifued. Pt requesting to "ride bicycle" and pt performs nustep on work load 5 for 11 min with supervision and VC to keep steps per min >45 to improve functional endurance required for community reintegration. Pt stands on biodex to complete maze and ball toss weight shifting game and wii lateral weight shifting game of soccer. Pt demonstrates difficulty weight shifting forward and backward with 13% accuracy for maze game and requiring 2.47 seconds to weight shift to the correct position with ball toss game. Pt requires sueprvision-CGA for all weight bearing and VC for proper weight shift technique as pt attempts to bend at hips to shift weight around. Pt completes wii ski jumping game requiring pt to assume squat position to activate LLE and facilitate equal weight bearing in BLE with CGA for squat. Exited session with pt supine in bed with call light in reach and all needs met.  Therapy Documentation Precautions:  Precautions Precautions: Fall Precaution Comments: Rt post op shoe - pt refused use.  Restrictions Weight Bearing Restrictions: No General:   See Function Navigator for Current Functional Status.   Therapy/Group: Individual Therapy  Tonny Branch 05/11/2017, 9:11 AM

## 2017-05-11 NOTE — Progress Notes (Signed)
[place] PHYSICAL MEDICINE & REHABILITATION     PROGRESS NOTE  Subjective/Complaints:  Right foot feeling better today. Slept well last night.   ROS: pt denies nausea, vomiting, diarrhea, cough, shortness of breath or chest pain   Objective: Vital Signs: Blood pressure 99/64, pulse 80, temperature 97.7 F (36.5 C), temperature source Oral, resp. rate 18, height 6' (1.829 m), weight 74.4 kg (164 lb 0.4 oz), SpO2 100 %. No results found.  Recent Labs  05/09/17 0428  WBC 8.5  HGB 11.8*  HCT 32.8*  PLT 256   No results for input(s): NA, K, CL, GLUCOSE, BUN, CREATININE, CALCIUM in the last 72 hours.  Invalid input(s): CO CBG (last 3)   Recent Labs  05/10/17 1652 05/10/17 2024 05/11/17 0635  GLUCAP 221* 226* 198*    Wt Readings from Last 3 Encounters:  05/11/17 74.4 kg (164 lb 0.4 oz)  05/05/17 79.6 kg (175 lb 6.4 oz)  04/23/17 85.5 kg (188 lb 6.4 oz)    Physical Exam:  BP 99/64 (BP Location: Left Arm)   Pulse 80   Temp 97.7 F (36.5 C) (Oral)   Resp 18   Ht 6' (1.829 m)   Wt 74.4 kg (164 lb 0.4 oz)   SpO2 100%   BMI 22.25 kg/m  Constitutional: He appears well-developed and well-nourished.  HENT: Normocephalic and atraumatic.  Eyes: EOM are normal. No discharge.  Cardiovascular: RRR without murmur. No JVD   . Respiratory: CTA Bilaterally without wheezes or rales. Normal effort    GI: Bowel sounds are normal. He exhibits no distension.  Musculoskeletal: He exhibits tenderness. He exhibits no edema.  Neurological: He is alert.  Patient makes eye contact with examiner.  Limited English-speaking.  Follows simple commands.  RUE 4+/5 (stable) LUE 4+/5 prox to distal. No changes RLE: 4/5 proximal to distal LLE: 4-4+/5 prox to distal.   Skin: Skin is warm and dry.  RLE wound intact 3-5 toes with mild swelling, remain somewhat tender to touch. Right leg and foot warm. No obvious breakdown Psychiatric:  Pleasant and cooperative   Assessment/Plan: 1.  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 Physiatrist is providing close team supervision and 24 hour management of active medical problems listed below. Physiatrist and rehab team continue to assess barriers to discharge/monitor patient progress toward functional and medical goals.  Function:  Bathing Bathing position   Position: Wheelchair/chair at sink  Bathing parts Body parts bathed by patient: Right arm, Left arm, Chest, Abdomen, Front perineal area, Buttocks, Right upper leg, Left upper leg, Right lower leg, Left lower leg Body parts bathed by helper: Back  Bathing assist Assist Level: Touching or steadying assistance(Pt > 75%)      Upper Body Dressing/Undressing Upper body dressing   What is the patient wearing?: Pull over shirt/dress     Pull over shirt/dress - Perfomed by patient: Put head through opening, Thread/unthread right sleeve, Thread/unthread left sleeve, Pull shirt over trunk          Upper body assist Assist Level: Set up      Lower Body Dressing/Undressing Lower body dressing   What is the patient wearing?: Pants, Non-skid slipper socks     Pants- Performed by patient: Thread/unthread right pants leg, Thread/unthread left pants leg, Pull pants up/down   Non-skid slipper socks- Performed by patient: Don/doff right sock, Don/doff left sock  Lower body assist Assist for lower body dressing: Touching or steadying assistance (Pt > 75%)      Toileting Toileting   Toileting steps completed by patient: Adjust clothing prior to toileting, Performs perineal hygiene, Adjust clothing after toileting   Toileting Assistive Devices: Grab bar or rail  Toileting assist Assist level: Touching or steadying assistance (Pt.75%)   Transfers Chair/bed transfer   Chair/bed transfer method: Ambulatory Chair/bed transfer assist  level: Supervision or verbal cues Chair/bed transfer assistive device: Armrests     Locomotion Ambulation     Max distance: 100 Assist level: Supervision or verbal cues   Wheelchair   Type: Manual Max wheelchair distance: 15 ft Assist Level: Touching or steadying assistance (Pt > 75%)  Cognition Comprehension Comprehension assist level: Understands basic 90% of the time/cues < 10% of the time  Expression Expression assist level: Expresses basic needs/ideas: With no assist  Social Interaction Social Interaction assist level: Interacts appropriately 90% of the time - Needs monitoring or encouragement for participation or interaction.  Problem Solving Problem solving assist level: Solves basic problems with no assist  Memory Memory assist level: Recognizes or recalls 90% of the time/requires cueing < 10% of the time    Medical Problem List and Plan: 1.  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  DVT Prophylaxis/Anticoagulation: Heparin ggt bridge to Coumadin therapy. Monitor for any bleeding episodes  INR therapeutic 9/21 3. Pain Management: Tylenol as needed  Gabapentin 100 TID started 9/18--increase to  TID for neuropathic pain  -suspect a lot of pain is due to ischemic insult to right foot prior to surgery. Daughter states pain has been in the foot since his acute admission   -continue protection and observation of foot   -tramadol for more severe pain   - Appears viable   -surgery follow up prn 4. Mood: Provide Emotional support 5. Neuropsych: This patient is capable of making decisions on his own behalf. 6. Skin/Wound Care: Routine skin checks 7. Fluids/Electrolytes/Nutrition: Routine I&Os 8. 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 No chest pain or shortness of breath. Continue aspirin therapy. Follow-up cardiology services 9. Diabetes mellitus with peripheral neuropathy.  Hemoglobin A1c 8.8-.SSI. Check blood sugars before meals and at bedtime. Patient on Lantus insulin 5 units daily at bedtime as well as Glucophage 1000 mg daily prior to admission.    Lantus 5 unit qhs started 9/19---increase to 10u tonight   -daytime SSI insulin initiated yesterday 10. Systolic congestive heart failure. Lasix 80 mg daily. Monitor for any signs of fluid overload Filed Weights   05/09/17 0440 05/10/17 0700 05/11/17 0551  Weight: 85.9 kg (189 lb 6 oz) 78 kg (171 lb 14.4 oz) 74.4 kg (164 lb 0.4 oz)   -monitor weights---trending down 11. Hypertension. Aldactone 50 mg daily  Some improvement of hypotension after Cozaar 50 decreased to  on 9/20 12. Hyperlipidemia. Lipitor 13. Acute on chronic anemia.   Hb 12.0 on 9/20--11.8 9/21  Cont to monitor 14. Questionable medical compliance. Counseling 15. Hyponatremia  Na+ 132 on 9/17  -recheck monday 16. AKI  Cr 1.39 on 9/17 17. Intermittent SOB  CXR with mild edema  Cont lasix  LOS (Days) 6 A FACE TO FACE EVALUATION WAS PERFORMED  SWARTZ,ZACHARY T 05/11/2017 8:43 AM

## 2017-05-11 NOTE — Progress Notes (Signed)
ANTICOAGULATION CONSULT NOTE - Follow up Consult  Pharmacy Consult for Heparin and Warfarin Indication: stroke and SFA thrombus  No Known Allergies  Patient Measurements: Height: 6' (182.9 cm) Weight: 164 lb 0.4 oz (74.4 kg) IBW/kg (Calculated) : 77.6 Heparin Dosing Weight: 87.7kg  Vital Signs: Temp: 97.7 F (36.5 C) (09/23 0551) Temp Source: Oral (09/23 0551) BP: 99/64 (09/23 0551) Pulse Rate: 80 (09/23 0551)  Labs:  Recent Labs  05/09/17 0428 05/09/17 0726 05/10/17 0603 05/11/17 0528  HGB 11.8*  --   --   --   HCT 32.8*  --   --   --   PLT 256  --   --   --   LABPROT 24.2*  --  21.6* 22.9*  INR 2.19  --  1.90 2.04  HEPARINUNFRC 1.80* 0.33  --   --     Estimated Creatinine Clearance: 52 mL/min (A) (by C-G formula based on SCr of 1.39 mg/dL (H)).   Assessment: 89 yom with recent acute stroke and subsequent SFA thrombus. INR this morning is therapeutic at 2.04. Hemoglobin is low, stable and platelets are within normal limits.    Goal of Therapy:  INR 2-3 Monitor platelets by anticoagulation protocol: Yes   Plan:  Warfarin 7.5mg  x 1 PO  Daily PT/INR Monitor for s/sx of bleeding   Blake Divine, Pharm.D. PGY1 Pharmacy Resident 05/11/2017 10:24 AM Main Pharmacy: [phone]

## 2017-05-12 ENCOUNTER — Inpatient Hospital Stay (HOSPITAL_COMMUNITY): Payer: Self-pay | Admitting: Occupational Therapy

## 2017-05-12 ENCOUNTER — Inpatient Hospital Stay (HOSPITAL_COMMUNITY): Payer: Self-pay | Admitting: Physical Therapy

## 2017-05-12 DIAGNOSIS — I5022 Chronic systolic (congestive) heart failure: Secondary | ICD-10-CM

## 2017-05-12 DIAGNOSIS — Z7901 Long term (current) use of anticoagulants: Secondary | ICD-10-CM

## 2017-05-12 LAB — CBC WITH DIFFERENTIAL/PLATELET
BASOS PCT: 0 %
Basophils Absolute: 0 10*3/uL (ref 0.0–0.1)
Eosinophils Absolute: 0.1 10*3/uL (ref 0.0–0.7)
Eosinophils Relative: 2 %
HCT: 32.8 % — ABNORMAL LOW (ref 39.0–52.0)
HEMOGLOBIN: 11.9 g/dL — AB (ref 13.0–17.0)
LYMPHS PCT: 47 %
Lymphs Abs: 3 10*3/uL (ref 0.7–4.0)
MCH: 25.1 pg — AB (ref 26.0–34.0)
MCHC: 36.3 g/dL — ABNORMAL HIGH (ref 30.0–36.0)
MCV: 69.2 fL — AB (ref 78.0–100.0)
MONOS PCT: 10 %
Monocytes Absolute: 0.6 10*3/uL (ref 0.1–1.0)
NEUTROS ABS: 2.6 10*3/uL (ref 1.7–7.7)
NEUTROS PCT: 41 %
Platelets: 260 10*3/uL (ref 150–400)
RBC: 4.74 MIL/uL (ref 4.22–5.81)
RDW: 15 % (ref 11.5–15.5)
WBC: 6.3 10*3/uL (ref 4.0–10.5)

## 2017-05-12 LAB — BASIC METABOLIC PANEL
ANION GAP: 10 (ref 5–15)
Anion gap: 11 (ref 5–15)
BUN: 30 mg/dL — ABNORMAL HIGH (ref 6–20)
BUN: 30 mg/dL — AB (ref 6–20)
CHLORIDE: 93 mmol/L — AB (ref 101–111)
CHLORIDE: 91 mmol/L — AB (ref 101–111)
CO2: 21 mmol/L — AB (ref 22–32)
CO2: 21 mmol/L — ABNORMAL LOW (ref 22–32)
CREATININE: 1.18 mg/dL (ref 0.61–1.24)
Calcium: 9.7 mg/dL (ref 8.9–10.3)
Calcium: 9.7 mg/dL (ref 8.9–10.3)
Creatinine, Ser: 1.14 mg/dL (ref 0.61–1.24)
GFR calc Af Amer: >60 mL/min (ref >60)
GFR calc non Af Amer: >60 mL/min (ref >60)
GLUCOSE: 182 mg/dL — AB (ref 65–99)
Glucose, Bld: 252 mg/dL — ABNORMAL HIGH (ref 65–99)
POTASSIUM: 4.8 mmol/L (ref 3.5–5.1)
POTASSIUM: 5 mmol/L (ref 3.5–5.1)
SODIUM: 123 mmol/L — AB (ref 135–145)
Sodium: 124 mmol/L — ABNORMAL LOW (ref 135–145)

## 2017-05-12 LAB — SODIUM, URINE, RANDOM: SODIUM UR: 63 mmol/L

## 2017-05-12 LAB — GLUCOSE, CAPILLARY
GLUCOSE-CAPILLARY: 195 mg/dL — AB (ref 65–99)
GLUCOSE-CAPILLARY: 278 mg/dL — AB (ref 65–99)

## 2017-05-12 LAB — PROTIME-INR
INR: 2.02
Prothrombin Time: 22.7 seconds — ABNORMAL HIGH (ref 11.4–15.2)

## 2017-05-12 MED ORDER — SPIRONOLACTONE 50 MG PO TABS: 50.0000 mg | ORAL_TABLET | Freq: Every day | ORAL | 5 refills | Status: DC

## 2017-05-12 MED ORDER — LOSARTAN POTASSIUM 25 MG PO TABS: 25.0000 mg | ORAL_TABLET | Freq: Every day | ORAL | 0 refills | Status: DC

## 2017-05-12 MED ORDER — GLUCOSE BLOOD VI STRP: ORAL_STRIP | 12 refills | Status: DC

## 2017-05-12 MED ORDER — FUROSEMIDE 80 MG PO TABS: 80.0000 mg | ORAL_TABLET | Freq: Every day | ORAL | 5 refills | Status: DC

## 2017-05-12 MED ORDER — POTASSIUM CHLORIDE CRYS ER 20 MEQ PO TBCR: 20.0000 meq | EXTENDED_RELEASE_TABLET | Freq: Two times a day (BID) | ORAL | 5 refills | Status: DC

## 2017-05-12 MED ORDER — WARFARIN SODIUM 7.5 MG PO TABS: 7.5000 mg | ORAL_TABLET | Freq: Every day | ORAL | Status: DC

## 2017-05-12 MED ORDER — GABAPENTIN 100 MG PO CAPS: 200.0000 mg | ORAL_CAPSULE | Freq: Three times a day (TID) | ORAL | 0 refills | Status: DC

## 2017-05-12 MED ORDER — TRAMADOL HCL 50 MG PO TABS: 50.0000 mg | ORAL_TABLET | Freq: Four times a day (QID) | ORAL | 0 refills | Status: DC | PRN

## 2017-05-12 MED ORDER — INSULIN GLARGINE 100 UNITS/ML SOLOSTAR PEN
10.0000 [IU] | PEN_INJECTOR | Freq: Every day | SUBCUTANEOUS | 11 refills | Status: DC
Start: 2017-05-12 — End: 2017-07-15

## 2017-05-12 MED ORDER — PANTOPRAZOLE SODIUM 40 MG PO TBEC: 40.0000 mg | DELAYED_RELEASE_TABLET | Freq: Every day | ORAL | 5 refills | Status: AC

## 2017-05-12 MED ORDER — WARFARIN SODIUM 7.5 MG PO TABS
7.5000 mg | ORAL_TABLET | Freq: Every day | ORAL | 1 refills | Status: DC
Start: 2017-05-12 — End: 2017-07-18

## 2017-05-12 MED ORDER — ATORVASTATIN CALCIUM 80 MG PO TABS: 80.0000 mg | ORAL_TABLET | Freq: Every day | ORAL | 11 refills | Status: AC

## 2017-05-12 MED ORDER — FUROSEMIDE 80 MG PO TABS: 40.0000 mg | ORAL_TABLET | Freq: Every day | ORAL | 5 refills | Status: DC

## 2017-05-12 MED ORDER — TRUE METRIX METER DEVI: 1.0000 | Freq: Four times a day (QID) | 0 refills | Status: DC

## 2017-05-12 MED ORDER — DOCUSATE SODIUM 100 MG PO CAPS: 100.0000 mg | ORAL_CAPSULE | Freq: Every day | ORAL | 0 refills | Status: DC

## 2017-05-12 MED ORDER — POTASSIUM CHLORIDE CRYS ER 20 MEQ PO TBCR: 20.0000 meq | EXTENDED_RELEASE_TABLET | Freq: Every day | ORAL | 5 refills | Status: DC

## 2017-05-12 MED FILL — LOSARTAN POTASSIUM 25 MG TA: 25 | 30 days supply | Qty: 30 | Fill #0

## 2017-05-12 MED FILL — GABAPENTIN 100 MG CAPSULE: 100 | 15 days supply | Qty: 90 | Fill #0

## 2017-05-12 MED FILL — WARFARIN SODIUM 7.5 MG TAB: 7.5 | 30 days supply | Qty: 30 | Fill #0

## 2017-05-12 MED FILL — POTASSIUM CL ER 20 MEQ TAB: 20 | 30 days supply | Qty: 30 | Fill #0

## 2017-05-12 MED FILL — traMADol HCL 50 MG TABS: 50 | 5 days supply | Qty: 20 | Fill #0

## 2017-05-12 MED FILL — TRUE METRIX TEST STRIP: 25 days supply | Qty: 100 | Fill #1

## 2017-05-12 MED FILL — ?FUROSEMIDE 80MG TABLET: 80 | 30 days supply | Qty: 15 | Fill #0

## 2017-05-12 MED FILL — LANTUS SOLOSTAR 100 UNITS/M: 100 | 28 days supply | Qty: 3 | Fill #0

## 2017-05-12 NOTE — Progress Notes (Signed)
Patient and family discussed all the discharged instructions with Dan A,PA before they left. 

## 2017-05-12 NOTE — Progress Notes (Signed)
ANTICOAGULATION CONSULT NOTE - Follow up Consult  Pharmacy Consult for Heparin and Warfarin Indication: stroke and SFA thrombus  No Known Allergies  Patient Measurements: Height: 6' (182.9 cm) Weight: 174 lb 1.6 oz (79 kg) IBW/kg (Calculated) : 77.6 Heparin Dosing Weight: 87.7kg  Vital Signs: Temp: 97.7 F (36.5 C) (09/24 0527) Temp Source: Oral (09/24 0527) BP: 113/72 (09/24 0956) Pulse Rate: 86 (09/24 0956)  Labs:  Recent Labs  05/10/17 0603 05/11/17 0528 05/12/17 0634  HGB  --   --  11.9*  HCT  --   --  32.8*  PLT  --   --  260  LABPROT 21.6* 22.9* 22.7*  INR 1.90 2.04 2.02  CREATININE  --   --  1.14    Estimated Creatinine Clearance: 66.2 mL/min (by C-G formula based on SCr of 1.14 mg/dL).   Assessment: 42 yom with recent acute stroke and subsequent SFA thrombus, on coumadin. INR this morning is therapeutic at 2.02. Hemoglobin and pltc are stable. No bleeding noted per chart. Likely discharge soon.  Goal of Therapy:  INR 2-3 Monitor platelets by anticoagulation protocol: Yes   Plan:  Warfarin 7.5mg  daily Daily PT/INR Monitor for s/sx of bleeding  If discharge, recommend coumadin 7.5 mg daily with outpatient INR follow up later this week.  Bayard Hugger, PharmD, BCPS  Clinical Pharmacist  Pager: [phone]   05/12/2017 11:33 AM Main Pharmacy: [phone]

## 2017-05-12 NOTE — Progress Notes (Signed)
Social Work  Discharge Note  The overall goal for the admission was met for:   Discharge location: Yes - home with daughter and her family to provide 24/7 assistance  Length of Stay: Yes - 7 days  Discharge activity level: Yes - supervision to min assist overall  Home/community participation: Yes  Services provided included: MD, RD, PT, OT, RN, Pharmacy and SW  Financial Services: None  Follow-up services arranged: Home Health: RN, PT, OT via [facility], DME: 18x18 lightweight w/c, cushion, rolling walker via [facility] and Patient/Family has no preference for HH/DME agencies  Comments (or additional information):  Patient/Family verbalized understanding of follow-up arrangements: Yes  Individual responsible for coordination of the follow-up plan: daughter  Confirmed correct DME delivered: Lennart Pall 05/12/2017    Dwanda Tufano

## 2017-05-12 NOTE — Progress Notes (Signed)
Occupational Therapy Session Note  Patient Details  Name: [name] MRN: [id number] Date of Birth: May 12, 1946  Today's Date: 05/12/2017 OT Individual Time: 1100-1158 OT Individual Time Calculation (min): 58 min    Short Term Goals: Week 1:  OT Short Term Goal 1 (Week 1): STG = LTGs due to ELOS  Skilled Therapeutic Interventions/Progress Updates:    Treatment session with focus on d/c planning and education with pt and pt's daughter.  Pt reports "heaviness", declining bathing/dressing tasks due to fatigue and heaviness.  Completed toilet transfer at Mod I level, pt's daughter reports he has been toileting without assist all weekend.  Discussed typical routine with bathing/dressing with pt's daughter reporting he is able to complete all tasks without assist other than setup for towel post bathing and to wash his back.  Pt declined out of room activity but willing to engage in fine motor control task.  Completed 9 hole peg test with Rt: 1:05 and Lt: 1:32 - suspect slower times secondary to overall fatigue and decreased focus during session.  Completed puzzle activity in sitting at EOB with mod-max cues for sequencing and problem solving.  Pt's daughter reports only questions regarding medical/current condition, otherwise ready for d/c home at overall Mod I/Supervision level.  Therapy Documentation Precautions:  Precautions Precautions: Fall Precaution Comments: Rt post op shoe - pt refused use.  Restrictions Weight Bearing Restrictions: No General:   Vital Signs: Therapy Vitals Pulse Rate: 86 BP: 113/72 Pain: Pain Assessment Pain Assessment: No/denies pain Pain Score: 0-No pain  See Function Navigator for Current Functional Status.   Therapy/Group: Individual Therapy  Rosalio Loud 05/12/2017, 12:17 PM

## 2017-05-12 NOTE — Progress Notes (Signed)
Occupational Therapy Discharge Summary  Patient Details  Name: [name] MRN: [id number] Date of Birth: [birth year]  Patient has met 8 of 9 long term goals due to improved balance, ability to compensate for deficits, functional use of  LEFT upper and LEFT lower extremity and improved awareness.  Patient to discharge at overall Modified Independent- Supervision level.  Patient's care partner is independent to provide the necessary intermittent supervision assistance at discharge.  Patient's daughter has been present for majority of treatment sessions and has observed as well as completed hands on care and reports ability to provide necessary intermittent supervision at d/c.  Reasons goals not met: Pt continues to require min assist with stepping over tub ledge for shower.    Recommendation:  Patient will benefit from ongoing skilled OT services in home health setting to continue to advance functional skills in the area of BADL and Reduce care partner burden.  Equipment: No equipment provided  Reasons for discharge: treatment goals met and discharge from hospital  Patient/family agrees with progress made and goals achieved: Yes  OT Discharge Precautions/Restrictions  Precautions Precautions: Fall Restrictions Weight Bearing Restrictions: No Vital Signs Therapy Vitals Pulse Rate: 86 BP: 113/72 Pain Pain Assessment Pain Assessment: No/denies pain Pain Score: 0-No pain ADL  See Function Navigator Vision Baseline Vision/History: Wears glasses Wears Glasses: At all times Patient Visual Report: No change from baseline Vision Assessment?: No apparent visual deficits Cognition Overall Cognitive Status: Within Functional Limits for tasks assessed Arousal/Alertness: Awake/alert Sensation Coordination Gross Motor Movements are Fluid and Coordinated: Yes Fine Motor Movements are Fluid and Coordinated: No Finger Nose Finger Test: decreased coordination with LUE, mild intention  tremor  Fine Motor Movements are Fluid and Coordinated: No Motor  Motor Motor - Discharge Observations: improving Lt LE strength Trunk/Postural Assessment  Cervical Assessment Cervical Assessment: Within Functional Limits Thoracic Assessment Thoracic Assessment: Within Functional Limits Lumbar Assessment Lumbar Assessment: Within Functional Limits Postural Control Postural Control:  (improving balance reactions)  Balance Static Standing Balance Static Standing - Level of Assistance: 6: Modified independent (Device/Increase time) Dynamic Standing Balance Dynamic Standing - Level of Assistance: 6: Modified independent (Device/Increase time) Extremity/Trunk Assessment RUE Assessment RUE Assessment: Within Functional Limits LUE Assessment LUE Assessment: Exceptions to WFL (ROM WFL, strength grossly 4/5)   See Function Navigator for Current Functional Status.  Simonne Come 05/12/2017, 3:18 PM

## 2017-05-12 NOTE — Discharge Summary (Signed)
[name], [name]                ACCOUNT NO.:  [id number]  MEDICAL RECORD NO.:  [id number]  LOCATION:                                 FACILITY:  PHYSICIAN:  [name], MD        DATE OF BIRTH:  06-04-1946  DATE OF ADMISSION:  05/05/2017 DATE OF DISCHARGE:  05/12/2017                              DISCHARGE SUMMARY   DISCHARGE DIAGNOSES: 1. 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 2. Chronic Coumadin therapy. 3. Pain management. 4. Coronary artery disease with CABG in 2007. 5. Diabetes mellitus. 6. Systolic congestive heart failure. 7. Hypertension. 8. Hyperlipidemia. 9. Acute kidney injury. 10. Hyponatremia 11.Questionable medical compliance.  HISTORY OF PRESENT ILLNESS:  This is a 71 year old right-handed, limited English-speaking male with history of coronary artery disease with CABG in 2007 in [place], diabetes mellitus, systolic congestive heart failure, hypertension, and CVA, as well as questionable medical compliance.  The patient lives with family, independent with a cane prior to admission.  He presented on April 24, 2017 with increasing shortness of breath as well as ischemic right leg.  He did require intubation.  Chest x-ray showed cardiac enlargement without failure.  CT of the head showed small focus of hypoattenuation within the right insula compatible with acute infarction.  Small chronic lacunar infarction in right superior thalamus.  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  CT angiogram showed occlusion of the right SFA just after the origin.  He underwent a right femoral- popliteal thromboembolectomy and right below-the-knee popliteal artery endarterectomy on April 25, 2017 per Dr. Imogene Burn.  POSTOPERATIVE COURSE:  Bladder neck contracture placing Foley catheter tube:  Urology was consulted and underwent cystoscopy for placement of Foley tube.  Postoperative left-sided weakness:   Neurology was consulted.  MRI showed faint ischemic changes involving the right insula and basal ganglia.  Echocardiogram with ejection fraction of 25% diffuse hypokinesis.  No emboli.  Acute on chronic anemia of 9.6 to 12.0 and monitored.  Maintained on heparin as well as Coumadin therapy.  Physical and occupational therapies are ongoing.  The patient was admitted for a comprehensive rehab program.  PAST MEDICAL HISTORY:  See discharge diagnoses.  SOCIAL HISTORY:  He lives with family, independent with a cane prior to admission.  Functional status upon admission to Rehab Services was moderate assist, ambulated 200 feet with rolling walker; moderate assist for stand pivot transfers; and mod-to-max assist for activities of daily living.  PHYSICAL EXAMINATION:  VITAL SIGNS:  Blood pressure was 95/61, pulse was 88, temperature was 98, and respirations were 18. GENERAL:  An alert male, limited English-speaking. EYES:  EOMs intact. NECK:  Supple.  Nontender.  No JVD. CARDIAC:  Rate controlled. ABDOMEN:  Soft and nontender.  Good bowel sounds. LUNGS:  Clear to auscultation without wheeze. EXTREMITIES:  Staples are intact to right lower extremity.  REHABILITATION HOSPITAL COURSE:  The patient was admitted to Inpatient Rehab Services, where therapies were initiated on a 3-hour daily basis consisting of physical therapy, occupational therapy, and rehabilitation nursing.  The following issues were addressed during the patient's rehabilitation stay:  Pertaining to Mr. Petties  right insular infarction, right lower extremity thromboembolectomy/endarterectomy remained stable.  He would follow up with Neurology Services. Maintained on aspirin as well as Coumadin therapy.  Current plan was for patient to follow up at [facility] for ongoing Coumadin monitoring.  A Home Health nurse had been arranged as well as the necessary medical followups.  There were no bleeding episodes.   Pain management was with the use of Neurontin.  Blood pressure was controlled on present regimen.  Diabetes mellitus and peripheral neuropathy, hemoglobin A1c of 8.8.  Full diabetic teaching, he was on insulin therapy with Lantus insulin. His home Glucophage will not resumed just yet due to some initial AKI that had improved.  He continued on Lipitor for hyperlipidemia.  Acute on chronic anemia is stable with latest hemoglobin of 12.  There was some question of medical noncompliance, this again was discussed with family. Noted bouts of hyponatremia 123-129. Urine sodium 63. Plan was to reduce his Lasix to 40 mg daily follow-[facility].  The patient received weekly collaborative interdisciplinary team conferences to discuss estimated length of stay, family teaching, and any barriers to discharge.  The patient was ambulating 150 feet without assistive device and minimal guard when needed.  He ambulates to the day room with minimal assist.  He can ambulate back to his room with minimal assist around obstacles and navigations.  He gathered belongings for activities of daily living and homemaking, dressing, grooming, and homemaking. Full family teaching was completed.  PLAN:  Discharge to home.  DISCHARGE MEDICATIONS:  Included 1. Aspirin 81 mg p.o. daily. 2. Lipitor 80 mg p.o. daily. 3. Colace 100 mg p.o. daily. 4. Lasix 40 mg p.o. daily. 5. Neurontin 200 mg p.o. t.i.d. 6. Lantus insulin 10 units at bedtime. 7. Cozaar 25 mg p.o. daily. 8. Protonix 40 mg p.o. daily. 9. Potassium chloride 20 mEq p.o. Daily 10.Aldactone 50 mg p.o. daily. 11.Coumadin, latest dose of 7.5 mg. 12.Ultram 50 mg p.o. every six hours as needed for pain.   The patient would follow up with Dr. [name] at the outpatient rehab service office as advised; Dr. Leonides Sake of Vascular Surgery, call for appointment; Dr. Olga Millers, Cardiology Service, call for appointment; Dr.  Roda Shutters, Neurology Services, call for appointment in 6 weeks; and Dr. Retta Diones, Urology Services.  A Home Health nurse has been arranged to draw INR on May 15, 2017.  Results to [facility] at [phone] and fax number [phone].  Follow-up within one week at the [facility] Center for monitoring of sodium level.   Mariam Dollar, P.A.   ______________________________ [name], MD    DA/MEDQ  D:  05/12/2017  T:  05/12/2017  Job:  [id number]  cc:   DR. Wandra Arthurs, MD Bertram Millard. Dahlstedt, M.D. Madolyn Frieze Jens Som, MD, [facility] [name], MD

## 2017-05-12 NOTE — Discharge Summary (Signed)
Discharge summary job [id number]

## 2017-05-12 NOTE — Progress Notes (Signed)
PHYSICAL MEDICINE & REHABILITATION     PROGRESS NOTE  Subjective/Complaints:  Pt seen sitting up in his chair this AM.  Family not at bedside.  He slept well overnight and denies pain.   ROS: Limited by language, but denies pain   Objective: Vital Signs: Blood pressure 108/81, pulse 94, temperature 97.7 F (36.5 C), temperature source Oral, resp. rate 18, height 6' (1.829 m), weight 79 kg (174 lb 1.6 oz), SpO2 100 %. No results found.  Recent Labs  05/12/17 0634  WBC 6.3  HGB 11.9*  HCT 32.8*  PLT 260    Recent Labs  05/12/17 0634  NA 124*  K 4.8  CL 93*  GLUCOSE 182*  BUN 30*  CREATININE 1.14  CALCIUM 9.7   CBG (last 3)   Recent Labs  05/11/17 1640 05/11/17 2114 05/12/17 0629  GLUCAP 311* 194* 195*    Wt Readings from Last 3 Encounters:  05/12/17 79 kg (174 lb 1.6 oz)  05/05/17 79.6 kg (175 lb 6.4 oz)  04/23/17 85.5 kg (188 lb 6.4 oz)    Physical Exam:  BP 108/81 (BP Location: Left Arm)   Pulse 94   Temp 97.7 F (36.5 C) (Oral)   Resp 18   Ht 6' (1.829 m)   Wt 79 kg (174 lb 1.6 oz)   SpO2 100%   BMI 23.61 kg/m  Constitutional: He appears well-developed and well-nourished.  HENT: Normocephalic and atraumatic.  Eyes: EOM are normal. No discharge.  Cardiovascular: RRR. No JVD. Respiratory: CTA Bilaterally. Normal effort    GI: Bowel sounds are normal. He exhibits no distension.  Musculoskeletal: He exhibits tenderness. He exhibits no edema.  Neurological: He is alert.  Patient makes eye contact with examiner.  Limited English-speaking.  Follows simple commands.  RUE 4+/5 (unchanged) LUE 4+/5 prox to distal. No changes RLE: 4/5 proximal to distal LLE: 4-4+/5 prox to distal.   Skin: Skin is warm and dry. RLE wound intact Psychiatric:  Pleasant and cooperative   Assessment/Plan: 1. Functional deficits secondary to right insular infarction which require 3+ hours per day of interdisciplinary therapy in a comprehensive inpatient  rehab setting. Physiatrist is providing close team supervision and 24 hour management of active medical problems listed below. Physiatrist and rehab team continue to assess barriers to discharge/monitor patient progress toward functional and medical goals.  Function:  Bathing Bathing position   Position: Wheelchair/chair at sink  Bathing parts Body parts bathed by patient: Right arm, Left arm, Chest, Abdomen, Front perineal area, Buttocks, Right upper leg, Left upper leg, Right lower leg, Left lower leg Body parts bathed by helper: Back  Bathing assist Assist Level: Touching or steadying assistance(Pt > 75%)      Upper Body Dressing/Undressing Upper body dressing   What is the patient wearing?: Pull over shirt/dress     Pull over shirt/dress - Perfomed by patient: Put head through opening, Thread/unthread right sleeve, Thread/unthread left sleeve, Pull shirt over trunk          Upper body assist Assist Level: Set up      Lower Body Dressing/Undressing Lower body dressing   What is the patient wearing?: Pants, Non-skid slipper socks     Pants- Performed by patient: Thread/unthread right pants leg, Thread/unthread left pants leg, Pull pants up/down   Non-skid slipper socks- Performed by patient: Don/doff right sock, Don/doff left sock                    Lower body  assist Assist for lower body dressing: Touching or steadying assistance (Pt > 75%)      Toileting Toileting   Toileting steps completed by patient: Adjust clothing prior to toileting, Performs perineal hygiene, Adjust clothing after toileting   Toileting Assistive Devices: Grab bar or rail  Toileting assist Assist level: Touching or steadying assistance (Pt.75%)   Transfers Chair/bed transfer   Chair/bed transfer method: Ambulatory Chair/bed transfer assist level: Touching or steadying assistance (Pt > 75%) Chair/bed transfer assistive device: Armrests     Locomotion Ambulation     Max distance:  150 ft Assist level: Touching or steadying assistance (Pt > 75%)   Wheelchair   Type: Manual Max wheelchair distance: 15 ft Assist Level: Touching or steadying assistance (Pt > 75%)  Cognition Comprehension Comprehension assist level: Follows basic conversation/direction with no assist  Expression Expression assist level: Expresses basic 90% of the time/requires cueing < 10% of the time.  Social Interaction Social Interaction assist level: Interacts appropriately 90% of the time - Needs monitoring or encouragement for participation or interaction.  Problem Solving Problem solving assist level: Solves basic 90% of the time/requires cueing < 10% of the time  Memory Memory assist level: Recognizes or recalls 90% of the time/requires cueing < 10% of the time    Medical Problem List and Plan: 1.  Left side weakness secondary to right insular infarction after right lower extremity thromboembolectomy/endarterectomy for ischemic occlusion  Cont CIR  Plans for D/c today pending electrolytes with close follow up  Will see patient for transitional care management in 1-2 weeks 2.  DVT Prophylaxis/Anticoagulation: Coumadin therapy. Monitor for any bleeding episodes  INR therapeutic 9/24 3. Pain Management: Tylenol as needed  Gabapentin 100 TID started 9/18, increased to  TID for neuropathic pain   -continue protection and observation of foot   -tramadol for more severe pain 4. Mood: Provide Emotional support 5. Neuropsych: This patient is capable of making decisions on his own behalf. 6. Skin/Wound Care: Routine skin checks 7. Fluids/Electrolytes/Nutrition: Routine I&Os 8. 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 No chest pain or shortness of breath. Continue aspirin therapy. Follow-up cardiology services 9. Diabetes mellitus with peripheral neuropathy. Hemoglobin A1c 8.8-.SSI. Check blood sugars before meals and at bedtime. Patient on Lantus insulin 5 units daily at bedtime as well as Glucophage 1000 mg  daily prior to admission.    Lantus 5 unit qhs started 9/19---increase to 10u tonight   -daytime SSI insulin initiated   Will need ambulatory adjustment 10. Systolic congestive heart failure. Lasix 80 mg daily. Monitor for any signs of fluid overload Filed Weights   05/10/17 0700 05/11/17 0551 05/12/17 0527  Weight: 78 kg (171 lb 14.4 oz) 74.4 kg (164 lb 0.4 oz) 79 kg (174 lb 1.6 oz)  11. Hypertension. Aldactone 50 mg daily  Some improvement of hypotension after Cozaar 50 decreased to  on 9/20  Controlled 9/24 12. Hyperlipidemia. Lipitor 13. Acute on chronic anemia.   Hb 11.9 9/24  Cont to monitor 14. Questionable medical compliance. Counseling 15. Hyponatremia  Na+ 124 on 9/24, repeat stat ordered  Will need close follow up 16. AKI  Cr 1.14 on 9/24 17. Intermittent SOB  CXR with mild edema  Cont lasix  LOS (Days) 7 A FACE TO FACE EVALUATION WAS PERFORMED  Amato Sevillano Karis Juba 05/12/2017 9:03 AM

## 2017-05-12 NOTE — Progress Notes (Signed)
Physical Therapy Discharge Summary  Patient Details  Name: [name] MRN: [id number] Date of Birth: [birth year]  Today's Date: 05/12/2017 PT Individual Time: 0930-0955 PT Individual Time Calculation (min): 25 min   Pt reports he feels "heavy" but no c/o pain.  With encouragement pt agreeable to perform gait with PT.  Pt performs gait in home and controlled environments with mod I.  Bathroom mobility and toilet transfers with mod I. Pt unable to complete full session due to '' heaviness ".  Pt's daughter states she feels safe and ready to take pt home at this level of care.  Patient has met 8 of 8 long term goals due to improved activity tolerance, improved balance, increased strength and ability to compensate for deficits.  Patient to discharge at an ambulatory level Modified Independent.   Patient's care partner is independent to provide the necessary intermittent supervision assistance at discharge.  Reasons goals not met: n/a  Recommendation:  Patient will benefit from ongoing skilled PT services in home health setting to continue to advance safe functional mobility, address ongoing impairments in strength, gait, balance, and minimize fall risk.  Equipment: RW, w/c  Reasons for discharge: treatment goals met and discharge from hospital  Patient/family agrees with progress made and goals achieved: Yes  PT Discharge Precautions/Restrictions Precautions Precautions: Fall Restrictions Weight Bearing Restrictions: No Pain Pain Assessment Pain Assessment: No/denies pain  Cognition Overall Cognitive Status: Within Functional Limits for tasks assessed Arousal/Alertness: Awake/alert Sensation Sensation Light Touch: Appears Intact Proprioception: Appears Intact Coordination Gross Motor Movements are Fluid and Coordinated: Yes Motor  Motor Motor - Discharge Observations: improving Lt LE strength    Trunk/Postural Assessment  Cervical Assessment Cervical Assessment: Within  Functional Limits Thoracic Assessment Thoracic Assessment: Within Functional Limits Lumbar Assessment Lumbar Assessment: Within Functional Limits Postural Control Postural Control:  (improving balance reactions)  Balance Static Standing Balance Static Standing - Level of Assistance: 6: Modified independent (Device/Increase time) Dynamic Standing Balance Dynamic Standing - Level of Assistance: 6: Modified independent (Device/Increase time) Extremity Assessment      RLE Assessment RLE Assessment: Within Functional Limits LLE Strength LLE Overall Strength Comments: grossly 4/5   See Function Navigator for Current Functional Status.  [name] 05/12/2017, 9:59 AM

## 2017-05-14 ENCOUNTER — Other Ambulatory Visit: Payer: Self-pay

## 2017-05-14 NOTE — Patient Outreach (Addendum)
[facility]) Care Management  05/14/2017  [name] [birth year] [id number]     EMMI-STROKE RED ON EMMI ALERT Day # 1 Date: 05/13/17 Red Alert Reason: "Questions/problems with meds? Yes"   Outreach attempt # 1 to patient. Spoke with dtr(ROI on file). She voices that patient speaks limited [place]. She has been answering automated calls. Reviewed and addressed red alert with dtr. She reports that she had some questions and concerns regarding his insulin. She called MD office yesterday and got issue resolved. Dtr states patient was taken off oral med and placed on insulin at bedtime. She was wondering what to do if cbgs were elevated during the day. Advised dtr to discuss this with MD. Encouraged her to keep blood sugar log and to relay abnormal values to MD. She voiced understanding She has d/c paperwork and is aware of multiple MD f/u appts patient has. She has taken some time off work to provide care to him and will be taking him to appts. She reports HH RN is coming out tomorrow to see patient and draw labs. She reports patient had some constipation(no Bm x4days) but took prune juice last night. No further RN CM needs or concerns voiced at this time. Advised dtr that they would continue to get automated EMMI-Stroke post discharge calls to assess how they are doing following recent hospitalization and will receive a call from a nurse if any of their responses were abnormal. She voiced understanding and was appreciative of f/u call.       Plan: RN CM will notify [facility] administrative assistant of case status.    Antionette Fairy, RN,BSN,CCM [facility] Care Management Telephonic Care Management Coordinator Direct Phone: [phone] Toll Free: [phone] Fax: [phone]

## 2017-05-15 ENCOUNTER — Telehealth: Payer: Self-pay

## 2017-05-15 LAB — PROTIME-INR: INR: 1.7 — AB (ref <=1.1)

## 2017-05-15 LAB — POCT INR: INR: 1.7

## 2017-05-15 NOTE — Telephone Encounter (Signed)
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  Total dose over 7 days (9/20-9/26) was 50 mg (7.5mg  qd x 5 mg x 1).  Was d/c with 7.5 mg qd instructions.

## 2017-05-15 NOTE — Telephone Encounter (Signed)
Lupita Leash from [facility] called to report start of care today and needs to verify if Dr. Allena Katz will be signing orders.

## 2017-05-15 NOTE — Telephone Encounter (Signed)
S/w Donita the [facility] nurse she states that she went to take the pt's PT/NR and her machine malfunctioned. She states that [facility] will be faxing the results tomorrow

## 2017-05-15 NOTE — Telephone Encounter (Signed)
Nurse from [facility] notified, Dr. Allena Katz will sign the orders.

## 2017-05-16 ENCOUNTER — Ambulatory Visit (INDEPENDENT_AMBULATORY_CARE_PROVIDER_SITE_OTHER): Payer: Self-pay | Admitting: Pharmacist

## 2017-05-16 DIAGNOSIS — Z7901 Long term (current) use of anticoagulants: Secondary | ICD-10-CM

## 2017-05-16 DIAGNOSIS — I63411 Cerebral infarction due to embolism of right middle cerebral artery: Secondary | ICD-10-CM

## 2017-05-20 ENCOUNTER — Other Ambulatory Visit: Payer: Self-pay | Admitting: *Deleted

## 2017-05-20 ENCOUNTER — Ambulatory Visit (INDEPENDENT_AMBULATORY_CARE_PROVIDER_SITE_OTHER): Payer: Self-pay | Admitting: Pharmacist Clinician (PhC)/ Clinical Pharmacy Specialist

## 2017-05-20 DIAGNOSIS — Z7901 Long term (current) use of anticoagulants: Secondary | ICD-10-CM

## 2017-05-20 DIAGNOSIS — I63411 Cerebral infarction due to embolism of right middle cerebral artery: Secondary | ICD-10-CM

## 2017-05-20 LAB — PROTIME-INR: INR: 1.6 — AB (ref <=1.1)

## 2017-05-20 NOTE — Telephone Encounter (Signed)
Donita, RN, [facility] left a message asking for verbal orders for medical social work.  Patient is medicaid pending. Nurse feels social work would help patient with community resource. Contacted HH RN and gave verbal orders per office protocol

## 2017-05-21 ENCOUNTER — Telehealth: Payer: Self-pay

## 2017-05-21 NOTE — Telephone Encounter (Signed)
Janine PT home health request verbal order to see patient 1wk 6. Approval given.

## 2017-05-22 ENCOUNTER — Encounter: Payer: Self-pay | Admitting: Physical Medicine & Rehabilitation

## 2017-05-22 ENCOUNTER — Encounter: Payer: Self-pay | Attending: Physical Medicine & Rehabilitation | Admitting: Physical Medicine & Rehabilitation

## 2017-05-22 VITALS — BP 89/54 | HR 78 | Resp 14 | Wt 177.4 lb

## 2017-05-22 DIAGNOSIS — I959 Hypotension, unspecified: Secondary | ICD-10-CM | POA: Insufficient documentation

## 2017-05-22 DIAGNOSIS — E871 Hypo-osmolality and hyponatremia: Secondary | ICD-10-CM | POA: Insufficient documentation

## 2017-05-22 DIAGNOSIS — I10 Essential (primary) hypertension: Secondary | ICD-10-CM

## 2017-05-22 DIAGNOSIS — M792 Neuralgia and neuritis, unspecified: Secondary | ICD-10-CM

## 2017-05-22 DIAGNOSIS — Z8249 Family history of ischemic heart disease and other diseases of the circulatory system: Secondary | ICD-10-CM | POA: Insufficient documentation

## 2017-05-22 DIAGNOSIS — Z7901 Long term (current) use of anticoagulants: Secondary | ICD-10-CM

## 2017-05-22 DIAGNOSIS — I63411 Cerebral infarction due to embolism of right middle cerebral artery: Secondary | ICD-10-CM

## 2017-05-22 DIAGNOSIS — Z9889 Other specified postprocedural states: Secondary | ICD-10-CM | POA: Insufficient documentation

## 2017-05-22 DIAGNOSIS — I110 Hypertensive heart disease with heart failure: Secondary | ICD-10-CM | POA: Insufficient documentation

## 2017-05-22 DIAGNOSIS — N179 Acute kidney failure, unspecified: Secondary | ICD-10-CM | POA: Insufficient documentation

## 2017-05-22 DIAGNOSIS — E785 Hyperlipidemia, unspecified: Secondary | ICD-10-CM | POA: Insufficient documentation

## 2017-05-22 DIAGNOSIS — I5020 Unspecified systolic (congestive) heart failure: Secondary | ICD-10-CM | POA: Insufficient documentation

## 2017-05-22 DIAGNOSIS — Z86711 Personal history of pulmonary embolism: Secondary | ICD-10-CM | POA: Insufficient documentation

## 2017-05-22 DIAGNOSIS — E1142 Type 2 diabetes mellitus with diabetic polyneuropathy: Secondary | ICD-10-CM | POA: Insufficient documentation

## 2017-05-22 DIAGNOSIS — Z951 Presence of aortocoronary bypass graft: Secondary | ICD-10-CM | POA: Insufficient documentation

## 2017-05-22 DIAGNOSIS — I63511 Cerebral infarction due to unspecified occlusion or stenosis of right middle cerebral artery: Secondary | ICD-10-CM | POA: Insufficient documentation

## 2017-05-22 DIAGNOSIS — Z5181 Encounter for therapeutic drug level monitoring: Secondary | ICD-10-CM

## 2017-05-22 DIAGNOSIS — Z8673 Personal history of transient ischemic attack (TIA), and cerebral infarction without residual deficits: Secondary | ICD-10-CM | POA: Insufficient documentation

## 2017-05-22 DIAGNOSIS — R269 Unspecified abnormalities of gait and mobility: Secondary | ICD-10-CM | POA: Insufficient documentation

## 2017-05-22 DIAGNOSIS — I5023 Acute on chronic systolic (congestive) heart failure: Secondary | ICD-10-CM

## 2017-05-22 NOTE — Progress Notes (Addendum)
Subjective:    Patient ID: [name], male    DOB: 01-Nov-1945, 71 y.o.   MRN: [id number]  HPI 71 year old right-handed, limited English-speaking male with history of coronary artery disease with CABG in 2007 in [place], diabetes mellitus, systolic congestive heart failure, hypertension, and CVA, as well as questionable medical compliance presents for hospital follow up after receiving CIR for right insular infarct.    DATE OF ADMISSION:  05/05/2017 DATE OF DISCHARGE:  05/12/2017  Daughter present, who provides history.  At discharge, he was instructed to follow up with Vascular Surgery next week. He sees Cards in 1 month.  He seen Neurology in 1 month.  He does not have an appointment with Urology. He goes to PCP next week. His INR is being monitored.  Denies pain today. He has been checking his CBGs, running ~120-260.  His BP is low.  He has not had a BMP since discharge. Denies falls.  Therapies: 1/week PT and OT Mobility: Cane/Walker DME: None required  Pain Inventory Average Pain 7 Pain Right Now 7 My pain is sharp, burning and stabbing  In the last 24 hours, has pain interfered with the following? General activity 7 Relation with others 2 Enjoyment of life 1 What TIME of day is your pain at its worst? evening Sleep (in general) Poor  Pain is worse with: some activites Pain improves with: medication Relief from Meds: no selection  Mobility walk without assistance walk with assistance use a cane use a walker ability to climb steps?  yes do you drive?  no  Function not employed: date last employed .  Neuro/Psych confusion  Prior Studies Any changes since last visit?  no  Physicians involved in your care Any changes since last visit?  no   Family History  Problem Relation Age of Onset  . Hypertension Mother   . Hypertension Sister    Social History   Social History  . Marital status: Married    Spouse name: N/A  . Number of children: N/A  . Years  of education: N/A   Social History Main Topics  . Smoking status: Never Smoker  . Smokeless tobacco: Never Used  . Alcohol use No  . Drug use: No  . Sexual activity: Not Asked   Other Topics Concern  . None   Social History Narrative   Pt is from [place] and speaks [place]. He came to the [place] in 02/2017.   He has a daughter and son-in-law that speak [place].    Past Surgical History:  Procedure Laterality Date  . CARDIAC SURGERY    . CYSTOSCOPY N/A 04/25/2017   Procedure: CYSTOSCOPY FLEXIBLE;  Surgeon: Fransisco Hertz, MD;  Location: [facility] OR;  Service: Vascular;  Laterality: N/A;  . EMBOLECTOMY Right 04/25/2017   Procedure: EMBOLECTOMY RIGHT FEMORAL ARTERY;  Surgeon: Fransisco Hertz, MD;  Location: [facility] OR;  Service: Vascular;  Laterality: Right;  . IR PERCUTANEOUS ART THROMBECTOMY/INFUSION INTRACRANIAL INC DIAG ANGIO  04/25/2017  . PATCH ANGIOPLASTY Right 04/25/2017   Procedure: PATCH ANGIOPLASTY USING Livia Snellen BIOLOGIC PATCH;  Surgeon: Fransisco Hertz, MD;  Location[facility] OR;  Service: Vascular;  Laterality: Right;  . PROSTATE SURGERY  2002  . RADIOLOGY WITH ANESTHESIA N/A 04/24/2017   Procedure: RADIOLOGY WITH ANESTHESIA Code Stroke;  Surgeon: Gilmer Mor, DO;  Location: [facility] OR;  Service: Anesthesiology;  Laterality: N/A;   Past Medical History:  Diagnosis Date  . CAD (coronary artery disease) 2007   CABG 2007 in [place]  .  CHF (congestive heart failure) ([name])   . Diabetes mellitus without complication ([name])   . Hyperlipidemia   . Hypertension   . Pulmonary embolism ([name])   . Stroke ([name])    BP (!) 89/54 (BP Location: Left Arm, Patient Position: Sitting, Cuff Size: Normal)   Pulse 78   Resp 14   SpO2 98%   Opioid Risk Score:   Fall Risk Score:  `1  Depression screen PHQ 2/9  No flowsheet data found.  Review of Systems  Constitutional: Negative.   HENT: Negative.   Eyes: Negative.   Respiratory: Negative.   Cardiovascular: Negative.   Gastrointestinal: Negative.   Endocrine:  Negative.   Genitourinary: Negative.   Musculoskeletal: Positive for gait problem.  Skin: Negative.   Allergic/Immunologic: Negative.   Hematological: Negative.   Psychiatric/Behavioral: Negative.   All other systems reviewed and are negative.      Objective:   Physical Exam Constitutional: He appears well-developed and well-nourished.  HENT: Normocephalic and atraumatic.  Eyes: EOM are normal. No discharge.  Cardiovascular: RRR. No JVD. Respiratory: CTA Bilaterally. Normal effort    GI: Bowel sounds are normal. He exhibits no distension.  Musculoskeletal: He exhibits tenderness. He exhibits no edema.  Neurological: He is alert.  Patient makes eye contact with examiner.  Limited English-speaking.  Follows simple commands.  RUE 5/5 proximal to distal LUE 5/5 proximal to distal.  RLE: 4+/5 proximal to distal LLE: 4/5 prox to distal.   Skin: Skin is warm and dry.  Psychiatric: Pleasant and cooperative     Assessment & Plan:  71 year old right-handed, limited English-speaking male with history of coronary artery disease with CABG in 2007 in [place], diabetes mellitus, systolic congestive heart failure, hypertension, and CVA, as well as questionable medical compliance presents for hospital follow up after receiving CIR for right insular infarct.     1.  Left side weakness secondary to right insular infarction after right lower extremity thromboembolectomy/endarterectomy for ischemic occlusion             Cont CIR  Follow up Neurology  Cont INR checks with adjustment, recent level subtherapeutic, being adjusted  2. Pain Management  Controlled at present  3. 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   Follow up Cardiology  4. Diabetes mellitus with peripheral neuropathy.   Hemoglobin A1c 8.8  Cont meds  CBGs elevated  Follow up with PCP for adjustments, mainly at night time  5. Systolic congestive heart failure.   Follow up Cards  Cont meds  6. Hypotension  Today in office,  however daughter states in 110s at home  7. Hyponatremia  No labs drawn at discharge as instructed, will order  8. AKI  Labs ordered  9. Gait abnormality  Cont cane/walker for safety  Addendum: Spoke with daughter on the phone regarding results of BMP/BP.  Encouraged reducing spironolactone to  and follow up with Cards next week.  Also spoke to OT on the phone and provided verbal orders for [facility] OT.

## 2017-05-23 LAB — BASIC METABOLIC PANEL
BUN/Creatinine Ratio: 18 (ref 10–24)
BUN: 21 mg/dL (ref 8–27)
CALCIUM: 9.3 mg/dL (ref 8.6–10.2)
CO2: 20 mmol/L (ref 20–29)
CREATININE: 1.2 mg/dL (ref 0.76–1.27)
Chloride: 87 mmol/L — ABNORMAL LOW (ref 96–106)
GFR calc Af Amer: 70 mL/min/{1.73_m2} (ref >59)
GFR, EST NON AFRICAN AMERICAN: 61 mL/min/{1.73_m2} (ref >59)
Glucose: 170 mg/dL — ABNORMAL HIGH (ref 65–99)
Potassium: 5.4 mmol/L — ABNORMAL HIGH (ref 3.5–5.2)
Sodium: 123 mmol/L — ABNORMAL LOW (ref 134–144)

## 2017-05-23 NOTE — Addendum Note (Signed)
Addended by: [name] A on: 05/23/2017 12:36 PM   Modules accepted: Level of Service

## 2017-05-26 NOTE — Progress Notes (Signed)
Cardiology Office Note    Date:  05/27/2017   ID:  [name], DOB 1946-02-03, MRN [id number]  PCP:  Arnoldo Morale, MD  Cardiologist: Dr. Stanford Breed   Chief Complaint  Patient presents with  . Hospitalization Follow-up    History of Present Illness:    [name] is a 71 y.o. male with past medical history of CAD (s/p CABG in 2007), ischemic cardiomyopathy, HTN, HLD, and Type 2 DM who presents to the office today for hospital follow-up.   He was recently admitted to [facility] from 9/6 - 05/05/2017 for acute on chronic combined systolic and diastolic CHF. On the second day of his hospitalization, he developed acute AMS and right facial droop with CODE STROKE being activated and him being found to have an acute right MCA occlusion and right popliteal embolic occlusion. He underwent M1 mechanical thrombectomy along with thrombectomy of the right femoropopliteal artery with proximal endarterectomy of the right tibioperoneal trunk and peroneal artery. He was started on Coumadin with Heparin bridge. EF was known to be reduced to 20-25% with recent NST in 03/2017 showing prior infarct but no ischemia. Was started on medical therapy for nonischemic cardiomyopathy with Spironolactone and Losartan (no BB due to episodes of bradycardia). He was admitted to CIR from 9/17 - 05/12/2017 and participated in physical and occupation therapy multiple times per day. At the time of discharge, weight was stable at 164 lbs.   In talking with the patient today, he is accompanied by his daughter who is the primary translator for this encounter. She reports that the patient is currently residing with her and has home health PT, OT, and an RN coming to the home multiple times per week. They are currently following his INR at home. He denies any recent chest pain, palpitations, PND, or lower extremity edema. Has been experiencing orthopnea. He is gradually increasing his exercise and is able to ambulate around the home  with a walker or cane. His main concern today is pain along with right foot which has been present since his hospitalization. He has been weighing regularly and weights have been stable at 174-176 lbs on their home scales.  Blood pressure has been checked by the home health providers with systolic readings in the 40'J to 90's. His daughter reports he quickly falls asleep at times and is unsure if this is due to blood pressure or his recent CVA.   Past Medical History:  Diagnosis Date  . CAD (coronary artery disease) 2007   CABG 2007 in [place]  . Chronic combined systolic and diastolic heart failure ([place])    a. EF 20-25% by echo in 03/2017 with NST showing prior infarct but no ischemia.   . Diabetes mellitus without complication ([place])   . Hyperlipidemia   . Hypertension   . Pulmonary embolism ([place])   . Stroke [facility])     Past Surgical History:  Procedure Laterality Date  . CARDIAC SURGERY    . CYSTOSCOPY N/A 04/25/2017   Procedure: CYSTOSCOPY FLEXIBLE;  Surgeon: Conrad Guernsey, MD;  Location: [place];  Service: Vascular;  Laterality: N/A;  . EMBOLECTOMY Right 04/25/2017   Procedure: EMBOLECTOMY RIGHT FEMORAL ARTERY;  Surgeon: Conrad Milltown, MD;  Location: [place];  Service: Vascular;  Laterality: Right;  . IR PERCUTANEOUS ART THROMBECTOMY/INFUSION INTRACRANIAL INC DIAG ANGIO  04/25/2017  . PATCH ANGIOPLASTY Right 04/25/2017   Procedure: PATCH ANGIOPLASTY USING [place];  Surgeon: Conrad Ephrata, MD;  Location: [place];  Service:  Vascular;  Laterality: Right;  . PROSTATE SURGERY  2002  . RADIOLOGY WITH ANESTHESIA N/A 04/24/2017   Procedure: RADIOLOGY WITH ANESTHESIA Code Stroke;  Surgeon: Corrie Mckusick, DO;  Location: [place];  Service: Anesthesiology;  Laterality: N/A;    Current Medications: Outpatient Medications Prior to Visit  Medication Sig Dispense Refill  . acetaminophen (TYLENOL) 325 MG tablet Take 2 tablets (650 mg total) by mouth every 4 (four) hours as needed for headache or  mild pain.    Marland Kitchen aspirin EC 81 MG EC tablet Take 1 tablet (81 mg total) by mouth daily.    Marland Kitchen atorvastatin (LIPITOR) 80 MG tablet Take 1 tablet (80 mg total) by mouth daily at 6 PM. 30 tablet 11  . Blood Glucose Monitoring Suppl (TRUE METRIX METER) DEVI 1 kit by Does not apply route 4 (four) times daily. 1 Device 0  . docusate sodium (COLACE) 100 MG capsule Take 1 capsule (100 mg total) by mouth daily. 10 capsule 0  . furosemide (LASIX) 80 MG tablet Take 0.5 tablets (40 mg total) by mouth daily. 30 tablet 5  . glucose blood (TRUE METRIX BLOOD GLUCOSE TEST) test strip Use as instructed 100 each 12  . insulin glargine (LANTUS) 100 unit/mL SOPN Inject 0.1 mLs (10 Units total) into the skin at bedtime. 15 mL 11  . pantoprazole (PROTONIX) 40 MG tablet Take 1 tablet (40 mg total) by mouth daily. 30 tablet 5  . traMADol (ULTRAM) 50 MG tablet Take 1 tablet (50 mg total) by mouth every 6 (six) hours as needed for severe pain. 20 tablet 0  . TRUEPLUS LANCETS 28G MISC 28 g by Does not apply route 4 (four) times daily. 120 each 11  . warfarin (COUMADIN) 7.5 MG tablet Take 1 tablet (7.5 mg total) by mouth daily at 6 PM. 30 tablet 1  . gabapentin (NEURONTIN) 100 MG capsule Take 2 capsules (200 mg total) by mouth 3 (three) times daily. 90 capsule 0  . losartan (COZAAR) 25 MG tablet Take 1 tablet (25 mg total) by mouth daily. 30 tablet 0  . potassium chloride SA (K-DUR,KLOR-CON) 20 MEQ tablet Take 1 tablet (20 mEq total) by mouth daily. 60 tablet 5  . spironolactone (ALDACTONE) 50 MG tablet Take 1 tablet (50 mg total) by mouth daily. 30 tablet 5   No facility-administered medications prior to visit.      Allergies:   Patient has no known allergies.   Social History   Social History  . Marital status: Married    Spouse name: N/A  . Number of children: N/A  . Years of education: N/A   Social History Main Topics  . Smoking status: Never Smoker  . Smokeless tobacco: Never Used  . Alcohol use No  . Drug  use: No  . Sexual activity: Not Asked   Other Topics Concern  . None   Social History Narrative   Pt is from [place] and speaks [place]. He came to the [place] in 02/2017.   He has a daughter and son-in-law that speak [place].      Family History:  The patient's family history includes Hypertension in his mother and sister.   Review of Systems:   Please see the history of present illness.     General:  No chills, fever, night sweats or weight changes. Positive for fatigue.  Cardiovascular:  No chest pain, dyspnea on exertion, edema, palpitations, paroxysmal nocturnal dyspnea. Positive for orthopnea.  Dermatological: No rash, lesions/masses Respiratory: No cough, dyspnea Urologic:  No hematuria, dysuria Abdominal:   No nausea, vomiting, diarrhea, bright red blood per rectum, melena, or hematemesis Neurologic:  No visual changes, changes in mental status. Positive for weakness.   All other systems reviewed and are otherwise negative except as noted above.   Physical Exam:    VS:  BP (!) 90/56   Pulse 75   Ht 6' (1.829 m)   Wt 178 lb 9.6 oz (81 kg)   BMI 24.22 kg/m    General: Well developed, well nourished,male appearing in no acute distress. Head: Normocephalic, atraumatic, sclera non-icteric, no xanthomas, nares are without discharge.  Neck: No carotid bruits. JVD not elevated.  Lungs: Respirations regular and unlabored, without wheezes or rales.  Heart: Regular rate and rhythm. No S3 or S4.  No murmur, no rubs, or gallops appreciated. Abdomen: Soft, non-tender, non-distended with normoactive bowel sounds. No hepatomegaly. No rebound/guarding. No obvious abdominal masses. Msk:  Strength and tone appear normal for age. No joint deformities or effusions. Extremities: No clubbing or cyanosis. No lower extremity edema.  Distal pedal pulses are 2+ bilaterally. Dressings in place along right metatarsals.  Neuro: Alert and oriented X 3. Moves all extremities spontaneously. No focal deficits  noted. Psych:  Responds to questions appropriately with a normal affect. Skin: No rashes or lesions noted  Wt Readings from Last 3 Encounters:  05/27/17 178 lb 9.6 oz (81 kg)  05/22/17 177 lb 6.4 oz (80.5 kg)  05/12/17 174 lb 1.6 oz (79 kg)     Studies/Labs Reviewed:   EKG:  EKG is not ordered today.   Recent Labs: 05/03/2017: Magnesium 1.7 05/05/2017: ALT 27 05/12/2017: Hemoglobin 11.9; Platelets 260 05/27/2017: BNP WILL FOLLOW; BUN 21; Creatinine, Ser 1.13; Potassium 5.4; Sodium 121   Lipid Panel    Component Value Date/Time   CHOL 92 04/25/2017 2317   TRIG 82 04/25/2017 2317   HDL 35 (L) 04/25/2017 2317   CHOLHDL 2.6 04/25/2017 2317   VLDL 16 04/25/2017 2317   LDLCALC 41 04/25/2017 2317    Additional studies/ records that were reviewed today include:   Echocardiogram: 04/2017 Study Conclusions  - Left ventricle: The cavity size was moderately dilated. Systolic   function was severely reduced. The estimated ejection fraction   was in the range of 20% to 25%. Diffuse hypokinesis. The study is   not technically sufficient to allow evaluation of LV diastolic   function. Acoustic contrast opacification revealed no evidence   ofthrombus. - Ventricular septum: Septal motion showed abnormal function and   dyssynergy. - Mitral valve: Calcified annulus. Mildly thickened leaflets .   There was moderate regurgitation. - Left atrium: The atrium was mildly dilated. - Right ventricle: The cavity size was mildly dilated. Wall   thickness was normal. - Right atrium: The atrium was mildly dilated. - Tricuspid valve: There was mild regurgitation.  Impressions:  - No cardiac source of emboli was indentified.  NST: 03/2017  There was no ST segment deviation noted during stress.  Defect 1: There is a medium defect of severe severity present in the basal inferolateral, mid inferior, mid inferolateral and apical inferior location.  Findings consistent with prior myocardial  infarction.  This is a high risk study due to reduced systolic function.  The left ventricular ejection fraction is severely decreased (<30%).  No ischemia  Assessment:    1. Chronic combined systolic and diastolic heart failure ([place])   2. Ischemic cardiomyopathy   3. Orthopnea   4. Coronary artery disease involving native coronary artery  of native heart without angina pectoris   5. Medication management   6. Benign essential HTN   7. Ischemia of right lower extremity   8. Right middle cerebral artery stroke [facility])      Plan:   In order of problems listed above:  1. Chronic Combined Systolic and Diastolic CHF/ Ischemic Cardiomyopathy - the patient has a known reduced EF of 20-25% echo in 03/2017 with NST at that time showing a prior infarction but no ischemia.  - recently admitted for acute on chronic CHF. Weights have been stable at 174-178 lbs on his home scales. Does report some orthopnea.  - will check BNP and BMET today. Continue Lasix 43m daily. He is not on BB therapy due to history of bradycardia. Continue Spironolactone 258mdaily. Currently on Losartan 256maily but SBP has been in the 80's - 90's, therefore will reduce Losartan to 12.5mg35mily. Will need to continue to titrate medications as BP allows and reassess EF in 3-4 months. If EF remains reduced, consider EP referral for ICD placement at that time.   2. CAD - s/p CABG in 2007. Recent NST showed prior infarct but no ischemia.  - he denies any recent chest pain.  - continue ASA and statin therapy. Consider stopping ASA once further out from CVA due to the need for Coumadin.   3. HTN - BP is soft at 90/56 during today's visit with SBP in the 80's when checked at home.  - will reduce Losartan to 12.5mg 57mly. Continue Spironolactone (dosing recently reduced from 50mg 75my to 25mg d16m just last week).  4. Right popliteal embolic occlusion - s/p thrombectomy of the right femoropopliteal artery with proximal  endarterectomy of the right tibioperoneal trunk and peroneal artery. Has been started on Coumadin for anticoagulation. Followed by Vascular Surgery.  - he has been experiencing pain most consistent with neuropathy along his right lower extremity. Distal pulses are palpable. He only has 2 days left of Gabapentin and does not follow-up with his PCP until next week, therefore will provide with Rx today. Further refills will need to come from his PCP.   5. Right Middle Cerebral CVA - diagnosed during recent admission with M1 mechanical thrombectomy performed at that time.  - continues to work with [facility].  - per Neurology.    Medication Adjustments/Labs and Tests Ordered: Current medicines are reviewed at length with the patient today.  Concerns regarding medicines are outlined above.  Medication changes, Labs and Tests ordered today are listed in the Patient Instructions below. Patient Instructions  Medication Instructions: DECREASE the Losartan to 12.5 mg daily (half of the 25 mg tablet) INCREASE the Gabapentin to 300 mg (one capsule) three times daily.  If you need a refill on your cardiac medications before your next appointment, please call your pharmacy.   Labwork: Your provider would like for you to have a BMET and BNP today.  Follow-Up: Your physician wants you to keep your appointment with Dr. CrenshaStanford Breed10.  Thank you for choosing [facility] at [facility] Signed, BrittanErma Heritage 05/27/2017 8:21 PM    [facility] [id number] [id number] [phone] [id number] [phone]

## 2017-05-27 ENCOUNTER — Ambulatory Visit (INDEPENDENT_AMBULATORY_CARE_PROVIDER_SITE_OTHER): Payer: Self-pay | Admitting: Student

## 2017-05-27 ENCOUNTER — Encounter: Payer: Self-pay | Admitting: Student

## 2017-05-27 ENCOUNTER — Ambulatory Visit: Payer: Self-pay | Admitting: Student

## 2017-05-27 ENCOUNTER — Ambulatory Visit (INDEPENDENT_AMBULATORY_CARE_PROVIDER_SITE_OTHER): Payer: Self-pay | Admitting: Pharmacist Clinician (PhC)/ Clinical Pharmacy Specialist

## 2017-05-27 VITALS — BP 90/56 | HR 75 | Ht 72.0 in | Wt 178.6 lb

## 2017-05-27 DIAGNOSIS — Z7901 Long term (current) use of anticoagulants: Secondary | ICD-10-CM

## 2017-05-27 DIAGNOSIS — I5042 Chronic combined systolic (congestive) and diastolic (congestive) heart failure: Secondary | ICD-10-CM | POA: Insufficient documentation

## 2017-05-27 DIAGNOSIS — Z79899 Other long term (current) drug therapy: Secondary | ICD-10-CM

## 2017-05-27 DIAGNOSIS — R0601 Orthopnea: Secondary | ICD-10-CM

## 2017-05-27 DIAGNOSIS — I10 Essential (primary) hypertension: Secondary | ICD-10-CM

## 2017-05-27 DIAGNOSIS — I998 Other disorder of circulatory system: Secondary | ICD-10-CM

## 2017-05-27 DIAGNOSIS — I63411 Cerebral infarction due to embolism of right middle cerebral artery: Secondary | ICD-10-CM

## 2017-05-27 DIAGNOSIS — I255 Ischemic cardiomyopathy: Secondary | ICD-10-CM

## 2017-05-27 DIAGNOSIS — I63511 Cerebral infarction due to unspecified occlusion or stenosis of right middle cerebral artery: Secondary | ICD-10-CM

## 2017-05-27 DIAGNOSIS — I2510 Atherosclerotic heart disease of native coronary artery without angina pectoris: Secondary | ICD-10-CM

## 2017-05-27 LAB — POCT INR: INR: 2

## 2017-05-27 MED ORDER — GABAPENTIN 300 MG PO CAPS: 300.0000 mg | ORAL_CAPSULE | Freq: Three times a day (TID) | ORAL | 0 refills | Status: DC

## 2017-05-27 MED ORDER — LOSARTAN POTASSIUM 25 MG PO TABS: 12.5000 mg | ORAL_TABLET | Freq: Every day | ORAL | 0 refills | Status: DC

## 2017-05-27 MED FILL — GABAPENTIN 300 MG CAPSULE: 300 | 10 days supply | Qty: 30 | Fill #0

## 2017-05-27 NOTE — Patient Instructions (Signed)
Medication Instructions: DECREASE the Losartan to 12.5 mg daily (half of the 25 mg tablet) INCREASE the Gabapentin to 300 mg (one capsule) three times daily.  If you need a refill on your cardiac medications before your next appointment, please call your pharmacy.   Labwork: Your provider would like for you to have a BMET and BNP today.  Follow-Up: Your physician wants you to keep your appointment with Dr. Jens Som on 12/10.  Thank you for choosing Heartcare at [facility]!!

## 2017-05-28 LAB — BASIC METABOLIC PANEL
BUN/Creatinine Ratio: 19 (ref 10–24)
BUN: 21 mg/dL (ref 8–27)
CALCIUM: 9.2 mg/dL (ref 8.6–10.2)
CHLORIDE: 85 mmol/L — AB (ref 96–106)
CO2: 19 mmol/L — ABNORMAL LOW (ref 20–29)
CREATININE: 1.13 mg/dL (ref 0.76–1.27)
GFR, EST AFRICAN AMERICAN: 76 mL/min/{1.73_m2} (ref >59)
GFR, EST NON AFRICAN AMERICAN: 65 mL/min/{1.73_m2} (ref >59)
Glucose: 138 mg/dL — ABNORMAL HIGH (ref 65–99)
Potassium: 5.4 mmol/L — ABNORMAL HIGH (ref 3.5–5.2)
Sodium: 121 mmol/L — ABNORMAL LOW (ref 134–144)

## 2017-05-28 LAB — BRAIN NATRIURETIC PEPTIDE: BNP: 1040.4 pg/mL — ABNORMAL HIGH (ref 0.0–100.0)

## 2017-05-29 ENCOUNTER — Telehealth: Payer: Self-pay | Admitting: Student

## 2017-05-29 NOTE — Telephone Encounter (Signed)
Patient's daughter has been made aware of results and verbalized her understanding. The patient has an appointment with his PCP tomorrow.  Notes recorded by Ellsworth Lennox, PA-C on 05/29/2017 at 7:38 AM EDT Please let the patient know his fluid level was elevated, but sodium remains significantly low at 121 (normal range 134-144). Would normally increase Lasix dosing but with his hyponatremia I am worried this will worsen his sodium levels and his daughter reported the Lasix dosing was just recently reduced by his PCP. With his breathing being at baseline and not volume overloaded on examination, continue Lasix at current dosing. He should follow-up with his PCP within the next 1-2 weeks for further assessment of his hyponatremia and repeat blood work at that time. Thank you.

## 2017-05-29 NOTE — Telephone Encounter (Signed)
°  Follow Up   Returning call regarding lab results. Please call.

## 2017-05-30 ENCOUNTER — Telehealth: Payer: Self-pay | Admitting: Cardiology

## 2017-05-30 ENCOUNTER — Ambulatory Visit (INDEPENDENT_AMBULATORY_CARE_PROVIDER_SITE_OTHER): Payer: Self-pay | Admitting: Pharmacist Clinician (PhC)/ Clinical Pharmacy Specialist

## 2017-05-30 DIAGNOSIS — I63411 Cerebral infarction due to embolism of right middle cerebral artery: Secondary | ICD-10-CM

## 2017-05-30 DIAGNOSIS — Z7901 Long term (current) use of anticoagulants: Secondary | ICD-10-CM

## 2017-05-30 LAB — POCT INR: INR: 2.4

## 2017-05-30 NOTE — Telephone Encounter (Signed)
See anticoag note

## 2017-05-30 NOTE — Telephone Encounter (Signed)
Returned call to [employer]. Explained that gabapentin was increased by PA on 10/9 to help with pain but further refills/med adjustments should be made at discretion of PCP. Advised OK to decrease gabapentin from  TID to  TID. BP 100/60 and HR 71 and regular. Patient is just fatigued. Patient sees PCP 10/15

## 2017-05-30 NOTE — Telephone Encounter (Signed)
New message    Donita from [facility]- [phone], reports patient seems for tired than usual, thinks the Gabapentin is the issue. Please call  Pt c/o medication issue:  1. Name of Medication: gabapentin (NEURONTIN) 300 MG capsule 2. How are you currently taking this medication (dosage and times per day)? Take 1 capsule (300 mg total) by mouth 3 (three) times daily.  3. Are you having a reaction (difficulty breathing--STAT)? no  4. What is your medication issue? Really tired, increased tremors

## 2017-05-30 NOTE — Telephone Encounter (Signed)
°  New Prob  Calling to report PT-INR readings.  PT: 29.2 INR: 2.4

## 2017-06-02 ENCOUNTER — Encounter: Payer: Self-pay | Admitting: Family Medicine

## 2017-06-02 ENCOUNTER — Ambulatory Visit: Payer: Self-pay | Attending: Family Medicine | Admitting: Family Medicine

## 2017-06-02 ENCOUNTER — Telehealth: Payer: Self-pay | Admitting: Family Medicine

## 2017-06-02 VITALS — BP 93/59 | HR 86 | Temp 98.1°F | Ht 72.0 in | Wt 179.6 lb

## 2017-06-02 DIAGNOSIS — I6389 Other cerebral infarction: Secondary | ICD-10-CM | POA: Insufficient documentation

## 2017-06-02 DIAGNOSIS — E1142 Type 2 diabetes mellitus with diabetic polyneuropathy: Secondary | ICD-10-CM | POA: Insufficient documentation

## 2017-06-02 DIAGNOSIS — M79671 Pain in right foot: Secondary | ICD-10-CM

## 2017-06-02 DIAGNOSIS — Z86711 Personal history of pulmonary embolism: Secondary | ICD-10-CM | POA: Insufficient documentation

## 2017-06-02 DIAGNOSIS — Z7902 Long term (current) use of antithrombotics/antiplatelets: Secondary | ICD-10-CM | POA: Insufficient documentation

## 2017-06-02 DIAGNOSIS — Z9889 Other specified postprocedural states: Secondary | ICD-10-CM | POA: Insufficient documentation

## 2017-06-02 DIAGNOSIS — Z951 Presence of aortocoronary bypass graft: Secondary | ICD-10-CM | POA: Insufficient documentation

## 2017-06-02 DIAGNOSIS — Z794 Long term (current) use of insulin: Secondary | ICD-10-CM | POA: Insufficient documentation

## 2017-06-02 DIAGNOSIS — Z8673 Personal history of transient ischemic attack (TIA), and cerebral infarction without residual deficits: Secondary | ICD-10-CM | POA: Insufficient documentation

## 2017-06-02 DIAGNOSIS — I2510 Atherosclerotic heart disease of native coronary artery without angina pectoris: Secondary | ICD-10-CM | POA: Insufficient documentation

## 2017-06-02 DIAGNOSIS — I63511 Cerebral infarction due to unspecified occlusion or stenosis of right middle cerebral artery: Secondary | ICD-10-CM

## 2017-06-02 DIAGNOSIS — I5042 Chronic combined systolic (congestive) and diastolic (congestive) heart failure: Secondary | ICD-10-CM | POA: Insufficient documentation

## 2017-06-02 DIAGNOSIS — R29810 Facial weakness: Secondary | ICD-10-CM | POA: Insufficient documentation

## 2017-06-02 DIAGNOSIS — I110 Hypertensive heart disease with heart failure: Secondary | ICD-10-CM | POA: Insufficient documentation

## 2017-06-02 DIAGNOSIS — Z7982 Long term (current) use of aspirin: Secondary | ICD-10-CM | POA: Insufficient documentation

## 2017-06-02 DIAGNOSIS — E785 Hyperlipidemia, unspecified: Secondary | ICD-10-CM | POA: Insufficient documentation

## 2017-06-02 DIAGNOSIS — E119 Type 2 diabetes mellitus without complications: Secondary | ICD-10-CM

## 2017-06-02 DIAGNOSIS — I10 Essential (primary) hypertension: Secondary | ICD-10-CM

## 2017-06-02 LAB — GLUCOSE, POCT (MANUAL RESULT ENTRY): POC GLUCOSE: 179 mg/dL — AB (ref 70–99)

## 2017-06-02 MED ORDER — CEPHALEXIN 500 MG PO CAPS: 500.0000 mg | ORAL_CAPSULE | Freq: Two times a day (BID) | ORAL | 0 refills | Status: DC

## 2017-06-02 MED ORDER — TRAMADOL HCL 50 MG PO TABS: 50.0000 mg | ORAL_TABLET | Freq: Two times a day (BID) | ORAL | 1 refills | Status: DC | PRN

## 2017-06-02 MED ORDER — LOSARTAN POTASSIUM 25 MG PO TABS: 12.5000 mg | ORAL_TABLET | Freq: Every day | ORAL | 3 refills | Status: DC

## 2017-06-02 MED ORDER — GABAPENTIN 100 MG PO CAPS: 200.0000 mg | ORAL_CAPSULE | Freq: Three times a day (TID) | ORAL | 3 refills | Status: DC

## 2017-06-02 MED FILL — CEPHALEXIN 500 MG CAPSULE: 500 | 10 days supply | Qty: 20 | Fill #0

## 2017-06-02 MED FILL — traMADol HCL 50 MG TABS: 50 | 30 days supply | Qty: 60 | Fill #0

## 2017-06-02 MED FILL — WARFARIN SODIUM 7.5 MG TAB: 7.5 | 30 days supply | Qty: 30 | Fill #1

## 2017-06-02 MED FILL — ?SPIRONOLACTONE 50 MG TABLE: 50 | 30 days supply | Qty: 30 | Fill #1

## 2017-06-02 MED FILL — GABAPENTIN 100 MG CAPSULE: 100 | 30 days supply | Qty: 180 | Fill #0

## 2017-06-02 MED FILL — ?PANTOPRAZOLE SOD DR 40MG: 40 MG | 30 days supply | Qty: 30 | Fill #1

## 2017-06-02 MED FILL — ?FUROSEMIDE 80MG TABLET: 80 | 30 days supply | Qty: 15 | Fill #1

## 2017-06-02 MED FILL — LOSARTAN POTASSIUM 25 MG TA: 25 | 30 days supply | Qty: 30 | Fill #1

## 2017-06-02 NOTE — Telephone Encounter (Signed)
Noted  

## 2017-06-02 NOTE — Progress Notes (Signed)
Subjective:  Patient ID: [name], male    DOB: February 08, 1946  Age: 71 y.o. MRN: [id number]  CC: Hospitalization Follow-up   HPI [name] is a 71 year old male with a history of type 2 diabetes mellitus (A1c 8.8), previous CVA, CAD (status post 2 vessel CABG in 2007) visiting from [place] who is accompanied by his daughter who does the translation today.  His history is notable for hospitalization for CHF exacerbation on 04/17/17 secondary to being off all his medications at which time, his Myoview to was negative for ischemia, EF was 20-25% his management was optimized and he was subsequently discharged only to be readmitted again on 04/24/17 with exertional dyspnea and orthopnea. Noticed to develop right facial droop and left weakness during second hospitalization and found to have an acute right MCA occlusion felt to be embolic as well as left popliteal acute embolic occlusion for which a code stroke was activated. Seen by neurology and he is status post right M1 mechanical thrombectomy with TICI3 reperfusion by vascular after which was commenced on Coumadin and heparin bridge. He was subsequently discharged to rehabilitation.  He underwent physical, occupational rehabilitation with improvement in his functional status.  Last week he had a visit with cardiology at which time the dose of losartan was decreased due to soft blood pressure. His last INR was 2.4, three days ago (managed by the Coumadin clinic) He complains of right foot pain and swelling today. His daughter informs me he bumped his right foot against a table. He currently takes gabapentin 200 mg 3 times daily (the dose had to be decreased due to somnolence with the higher dose)  Past Medical History:  Diagnosis Date  . CAD (coronary artery disease) 2007   CABG 2007 in [place]  . Chronic combined systolic and diastolic heart failure ([place])    a. EF 20-25% by echo in 03/2017 with NST showing prior infarct but no ischemia.   .  Diabetes mellitus without complication ([place])   . Hyperlipidemia   . Hypertension   . Pulmonary embolism ([place])   . Stroke [facility])     Past Surgical History:  Procedure Laterality Date  . CARDIAC SURGERY    . CYSTOSCOPY N/A 04/25/2017   Procedure: CYSTOSCOPY FLEXIBLE;  Surgeon: Conrad Le[place], MD;  Location: [place];  Service: Vascular;  Laterality: N/A;  . EMBOLECTOMY Right 04/25/2017   Procedure: EMBOLECTOMY RIGHT FEMORAL ARTERY;  Surgeon: Conrad [place], MD;  Location: [place];  Service: Vascular;  Laterality: Right;  . IR PERCUTANEOUS ART THROMBECTOMY/INFUSION INTRACRANIAL INC DIAG ANGIO  04/25/2017  . PATCH ANGIOPLASTY Right 04/25/2017   Procedure: PATCH ANGIOPLASTY USING [place];  Surgeon: Conrad [place], MD;  Location: [place];  Service: Vascular;  Laterality: Right;  . PROSTATE SURGERY  2002  . RADIOLOGY WITH ANESTHESIA N/A 04/24/2017   Procedure: RADIOLOGY WITH ANESTHESIA Code Stroke;  Surgeon: Corrie Mckusick, DO;  Location: [place];  Service: Anesthesiology;  Laterality: N/A;    No Known Allergies   Outpatient Medications Prior to Visit  Medication Sig Dispense Refill  . acetaminophen (TYLENOL) 325 MG tablet Take 2 tablets (650 mg total) by mouth every 4 (four) hours as needed for headache or mild pain.    Marland Kitchen aspirin EC 81 MG EC tablet Take 1 tablet (81 mg total) by mouth daily.    Marland Kitchen atorvastatin (LIPITOR) 80 MG tablet Take 1 tablet (80 mg total) by mouth daily at 6 PM. 30 tablet 11  . Blood Glucose Monitoring  Suppl (TRUE METRIX METER) DEVI 1 kit by Does not apply route 4 (four) times daily. 1 Device 0  . furosemide (LASIX) 80 MG tablet Take 0.5 tablets (40 mg total) by mouth daily. 30 tablet 5  . glucose blood (TRUE METRIX BLOOD GLUCOSE TEST) test strip Use as instructed 100 each 12  . insulin glargine (LANTUS) 100 unit/mL SOPN Inject 0.1 mLs (10 Units total) into the skin at bedtime. 15 mL 11  . losartan (COZAAR) 25 MG tablet Take 0.5 tablets (12.5 mg total) by mouth daily. 30  tablet 0  . NOVOLOG 100 UNIT/ML injection Inject 5 Units into the skin at bedtime.  11  . pantoprazole (PROTONIX) 40 MG tablet Take 1 tablet (40 mg total) by mouth daily. 30 tablet 5  . spironolactone (ALDACTONE) 50 MG tablet Take 25 mg by mouth daily.  5  . TRUEPLUS LANCETS 28G MISC 28 g by Does not apply route 4 (four) times daily. 120 each 11  . warfarin (COUMADIN) 7.5 MG tablet Take 1 tablet (7.5 mg total) by mouth daily at 6 PM. 30 tablet 1  . gabapentin (NEURONTIN) 300 MG capsule Take 1 capsule (300 mg total) by mouth 3 (three) times daily. 30 capsule 0  . traMADol (ULTRAM) 50 MG tablet Take 1 tablet (50 mg total) by mouth every 6 (six) hours as needed for severe pain. 20 tablet 0  . docusate sodium (COLACE) 100 MG capsule Take 1 capsule (100 mg total) by mouth daily. (Patient not taking: Reported on 06/02/2017) 10 capsule 0   No facility-administered medications prior to visit.     ROS Review of Systems  Constitutional: Negative for activity change and appetite change.  HENT: Negative for sinus pressure and sore throat.   Eyes: Negative for visual disturbance.  Respiratory: Negative for cough, chest tightness and shortness of breath.   Cardiovascular: Negative for chest pain and leg swelling.  Gastrointestinal: Negative for abdominal distention, abdominal pain, constipation and diarrhea.  Endocrine: Negative.   Genitourinary: Negative for dysuria.  Musculoskeletal:       See hpi  Skin: Negative for rash.  Allergic/Immunologic: Negative.   Neurological: Negative for weakness, light-headedness and numbness.  Psychiatric/Behavioral: Negative for dysphoric mood and suicidal ideas.    Objective:  BP (!) 93/59   Pulse 86   Temp 98.1 F (36.7 C) (Oral)   Ht 6' (1.829 m)   Wt 179 lb 9.6 oz (81.5 kg)   SpO2 99%   BMI 24.36 kg/m   BP/Weight 06/02/2017 05/27/2017 23/12/5730  Systolic BP 93 90 89  Diastolic BP 59 56 54  Wt. (Lbs) 179.6 178.6 177.4  BMI 24.36 24.22 24.06       Physical Exam  Constitutional: He is oriented to person, place, and time. He appears well-developed and well-nourished.  Cardiovascular: Normal rate, normal heart sounds and intact distal pulses.   No murmur heard. Pulmonary/Chest: Effort normal and breath sounds normal. He has no wheezes. He has no rales. He exhibits no tenderness.  Abdominal: Soft. Bowel sounds are normal. He exhibits no distension and no mass. There is no tenderness.  Musculoskeletal: He exhibits edema (edema of anterior dorsum of right foot) and tenderness (tenderness of right middle phalanx and nail which is yellowish and loosely attached to bed).  Neurological: He is alert and oriented to person, place, and time.  Skin: Skin is warm and dry.  Psychiatric: He has a normal mood and affect.    Lab Results  Component Value Date   HGBA1C  8.8 (H) 04/25/2017    Assessment & Plan:   1. Type 2 diabetes mellitus with peripheral neuropathy ([name]) Uncontrolled with A1c of 8.8 Blood sugar log reveals improvement No regimen change at this time Emphasized the need to comply with a diabetic diet and lifestyle modification - POCT glucose (manual entry) - gabapentin (NEURONTIN) 100 MG capsule; Take 2 capsules (200 mg total) by mouth 3 (three) times daily.  Dispense: 180 capsule; Refill: 3 - CMP14+EGFR  2. Right middle cerebral artery stroke ([name]) Mild residual left-sided weakness Continue Coumadin and aspirin Risk factor modification Keep appointment with neurology and PT  3. Chronic combined systolic and diastolic heart failure ([name]) EF 20-25% from 2-D echo 04/2017 Euvolemic Continue Lasix, spironolactone, ARB No beta blocker due to soft blood pressure and previous bradycardia EP evaluation down the road Daily weights, heart healthy, low-sodium diet  4. Coronary artery disease involving native coronary artery of native heart without angina pectoris Status post two-vessel coronary artery bypass graft in  2007 Risk factor modification  5. Foot pain, right Secondary to possible trauma of right third toe We'll treat with Keflex to cover for possible bacterial superinfection - traMADol (ULTRAM) 50 MG tablet; Take 1 tablet (50 mg total) by mouth every 12 (twelve) hours as needed for severe pain.  Dispense: 60 tablet; Refill: 1 - Uric acid  6. Benign essential HTN Soft blood pressure Continue antihypertensives We'll titrate down the dose if blood pressure remains on the low side   Meds ordered this encounter  Medications  . gabapentin (NEURONTIN) 100 MG capsule    Sig: Take 2 capsules (200 mg total) by mouth 3 (three) times daily.    Dispense:  180 capsule    Refill:  3  . traMADol (ULTRAM) 50 MG tablet    Sig: Take 1 tablet (50 mg total) by mouth every 12 (twelve) hours as needed for severe pain.    Dispense:  60 tablet    Refill:  1    Follow-up: Return in about 1 month (around 07/03/2017) for follow up on chronic medical conditions.   Arnoldo Morale MD

## 2017-06-02 NOTE — Patient Instructions (Signed)
Diabetes Mellitus and Food It is important for you to manage your blood sugar (glucose) level. Your blood glucose level can be greatly affected by what you eat. Eating healthier foods in the appropriate amounts throughout the day at about the same time each day will help you control your blood glucose level. It can also help slow or prevent worsening of your diabetes mellitus. Healthy eating may even help you improve the level of your blood pressure and reach or maintain a healthy weight. General recommendations for healthful eating and cooking habits include:  Eating meals and snacks regularly. Avoid going long periods of time without eating to lose weight.  Eating a diet that consists mainly of plant-based foods, such as fruits, vegetables, nuts, legumes, and whole grains.  Using low-heat cooking methods, such as baking, instead of high-heat cooking methods, such as deep frying.  Work with your dietitian to make sure you understand how to use the Nutrition Facts information on food labels. How can food affect me? Carbohydrates Carbohydrates affect your blood glucose level more than any other type of food. Your dietitian will help you determine how many carbohydrates to eat at each meal and teach you how to count carbohydrates. Counting carbohydrates is important to keep your blood glucose at a healthy level, especially if you are using insulin or taking certain medicines for diabetes mellitus. Alcohol Alcohol can cause sudden decreases in blood glucose (hypoglycemia), especially if you use insulin or take certain medicines for diabetes mellitus. Hypoglycemia can be a life-threatening condition. Symptoms of hypoglycemia (sleepiness, dizziness, and disorientation) are similar to symptoms of having too much alcohol. If your health care provider has given you approval to drink alcohol, do so in moderation and use the following guidelines:  Women should not have more than one drink per day, and men  should not have more than two drinks per day. One drink is equal to: ? 12 oz of beer. ? 5 oz of wine. ? 1 oz of hard liquor.  Do not drink on an empty stomach.  Keep yourself hydrated. Have water, diet soda, or unsweetened iced tea.  Regular soda, juice, and other mixers might contain a lot of carbohydrates and should be counted.  What foods are not recommended? As you make food choices, it is important to remember that all foods are not the same. Some foods have fewer nutrients per serving than other foods, even though they might have the same number of calories or carbohydrates. It is difficult to get your body what it needs when you eat foods with fewer nutrients. Examples of foods that you should avoid that are high in calories and carbohydrates but low in nutrients include:  Trans fats (most processed foods list trans fats on the Nutrition Facts label).  Regular soda.  Juice.  Candy.  Sweets, such as cake, pie, doughnuts, and cookies.  Fried foods.  What foods can I eat? Eat nutrient-rich foods, which will nourish your body and keep you healthy. The food you should eat also will depend on several factors, including:  The calories you need.  The medicines you take.  Your weight.  Your blood glucose level.  Your blood pressure level.  Your cholesterol level.  You should eat a variety of foods, including:  Protein. ? Lean cuts of meat. ? Proteins low in saturated fats, such as fish, egg whites, and beans. Avoid processed meats.  Fruits and vegetables. ? Fruits and vegetables that may help control blood glucose levels, such as apples,   mangoes, and yams.  Dairy products. ? Choose fat-free or low-fat dairy products, such as milk, yogurt, and cheese.  Grains, bread, pasta, and rice. ? Choose whole grain products, such as multigrain bread, whole oats, and brown rice. These foods may help control blood pressure.  Fats. ? Foods containing healthful fats, such as  nuts, avocado, olive oil, canola oil, and fish.  Does everyone with diabetes mellitus have the same meal plan? Because every person with diabetes mellitus is different, there is not one meal plan that works for everyone. It is very important that you meet with a dietitian who will help you create a meal plan that is just right for you. This information is not intended to replace advice given to you by your health care provider. Make sure you discuss any questions you have with your health care provider. Document Released: 05/02/2005 Document Revised: 01/11/2016 Document Reviewed: 07/02/2013 Elsevier Interactive Patient Education  2017 Elsevier Inc.  

## 2017-06-02 NOTE — Telephone Encounter (Signed)
Pt. Dropped off disability parking placard to be filled out by PCP. Form will be put in PCP box. Please f/u

## 2017-06-03 LAB — CMP14+EGFR
ALK PHOS: 121 IU/L — AB (ref 39–117)
ALT: 24 IU/L (ref 0–44)
AST: 19 IU/L (ref 0–40)
Albumin/Globulin Ratio: 2.2 (ref 1.2–2.2)
Albumin: 4.1 g/dL (ref 3.5–4.8)
BILIRUBIN TOTAL: 0.7 mg/dL (ref 0.0–1.2)
BUN/Creatinine Ratio: 15 (ref 10–24)
BUN: 17 mg/dL (ref 8–27)
CHLORIDE: 83 mmol/L — AB (ref 96–106)
CO2: 20 mmol/L (ref 20–29)
Calcium: 9.3 mg/dL (ref 8.6–10.2)
Creatinine, Ser: 1.14 mg/dL (ref 0.76–1.27)
GFR calc Af Amer: 75 mL/min/{1.73_m2} (ref >59)
GFR calc non Af Amer: 65 mL/min/{1.73_m2} (ref >59)
GLUCOSE: 174 mg/dL — AB (ref 65–99)
Globulin, Total: 1.9 g/dL (ref 1.5–4.5)
Potassium: 4.4 mmol/L (ref 3.5–5.2)
Sodium: 120 mmol/L — ABNORMAL LOW (ref 134–144)
Total Protein: 6 g/dL (ref 6.0–8.5)

## 2017-06-03 LAB — URIC ACID: URIC ACID: 5.3 mg/dL (ref 3.7–8.6)

## 2017-06-03 NOTE — Progress Notes (Signed)
    Postoperative Visit   History of Present Illness   [name] is a 71 y.o. year old male who presents for postoperative follow-up for: TE R fem-pop artery, R BK pop EA w/ BPA, prox EA R TPT and AT  (04/25/17).  This was done in the setting of an IR mech thrombectomy of R MCA.  IR felt this patient had an acute embolus to the R popliteal artery.  There was lack of proper documentation of the pulses in this patient pre-op, which skewed the decision making toward exploration.  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  The patient's recovery post-op was consistent with such.  The patient's wounds are  Healing well.  The patient notes moderate resolution of lower extremity symptoms.  He continues to have burning type pain and has developed a thrid toe ulcer from a nail coming off with moisture between the 3erd and 4th toes. The patient is unable to complete their activities of daily living due to CVA and right sided weakness.  The patient's current symptoms are: S/P stroke, right UE/LE weakness.  Pain is being managed with tramadol and Gabapentin.  He is on Coumadin 7.5 mg daily, aspirin and Lipitor.     For VQI Use Only   PRE-ADM LIVING: Home  AMB STATUS: Wheelchair   Physical Examination   RLE: Incisions are  Healing well, new third toe nail bed ulcer, right pedal pulses are not palpable, feet are warm with DP left pedal palpable.  Edema right LE.  NEURO: Palpable radial pulses B, weakness right UE/LE.  Medical Decision Making   [name] is a 71 y.o. 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    The patient's bypass incisions are healing appropriately with resolution of pre-operative symptoms. I discussed in depth with the patient the nature of atherosclerosis, and emphasized the importance of maximal medical management including strict control of blood pressure, blood glucose,  and lipid levels, obtaining regular exercise, and cessation of smoking.  The patient is aware that without maximal medical management the underlying atherosclerotic disease process will progress, limiting the benefit of any interventions. The patient's surveillance will included ABI and bypass duplex studies which will be completed in: 2 months, at which time the patient will be re-evaluated.   I emphasized the importance of routine surveillance of the patient's bypass, as the vascular surgery literature emphasize the improved patency possible with assisted primary patency procedures versus secondary patency procedures. The patient agrees to participate in their maximal medical care and routine surveillance. Thank you for allowing [place] to participate in this patient's care. Referral to wound care center for right third toe ulcer.  Dry guaze placed between the toes for protection.  COLLINS, EMMA MAUREEN PA-C  The patient was seen in conjunction with Dr. Imogene Burn  Addendum  I have independently interviewed and examined the patient, and I agree with the physician assistant's findings.    Leonides Sake, MD, FACS [facility] Office: [phone] Pager: [phone]  06/06/2017, 2:34 PM

## 2017-06-06 ENCOUNTER — Encounter: Payer: Self-pay | Admitting: Vascular Surgery

## 2017-06-06 ENCOUNTER — Telehealth: Payer: Self-pay | Admitting: Cardiology

## 2017-06-06 ENCOUNTER — Telehealth: Payer: Self-pay | Admitting: Family Medicine

## 2017-06-06 ENCOUNTER — Ambulatory Visit (INDEPENDENT_AMBULATORY_CARE_PROVIDER_SITE_OTHER): Payer: Self-pay | Admitting: Pharmacist Clinician (PhC)/ Clinical Pharmacy Specialist

## 2017-06-06 ENCOUNTER — Ambulatory Visit (INDEPENDENT_AMBULATORY_CARE_PROVIDER_SITE_OTHER): Payer: Self-pay | Admitting: Vascular Surgery

## 2017-06-06 VITALS — BP 88/65 | HR 73 | Temp 97.1°F | Resp 16 | Ht 70.0 in | Wt 178.0 lb

## 2017-06-06 DIAGNOSIS — Z7901 Long term (current) use of anticoagulants: Secondary | ICD-10-CM

## 2017-06-06 DIAGNOSIS — I739 Peripheral vascular disease, unspecified: Secondary | ICD-10-CM

## 2017-06-06 DIAGNOSIS — E1142 Type 2 diabetes mellitus with diabetic polyneuropathy: Secondary | ICD-10-CM

## 2017-06-06 DIAGNOSIS — I63411 Cerebral infarction due to embolism of right middle cerebral artery: Secondary | ICD-10-CM

## 2017-06-06 DIAGNOSIS — I743 Embolism and thrombosis of arteries of the lower extremities: Secondary | ICD-10-CM

## 2017-06-06 LAB — POCT INR: INR: 1.7

## 2017-06-06 MED ORDER — LACTULOSE 10 GM/15ML PO SOLN: 10.0000 g | Freq: Three times a day (TID) | ORAL | 1 refills | Status: DC

## 2017-06-06 MED ORDER — GABAPENTIN 300 MG PO CAPS: 300.0000 mg | ORAL_CAPSULE | Freq: Every day | ORAL | 3 refills | Status: DC

## 2017-06-06 NOTE — Addendum Note (Signed)
Addended by: Jaclyn ShaggyAMAO, Karisha Marlin on: 06/06/2017 01:16 PM   Modules accepted: Orders

## 2017-06-06 NOTE — Telephone Encounter (Signed)
[facility] called because the patient has not had a bowel movement in 8 days and needs something to help the Murelax is not working. The daughter wants the gabapentin reduced to 100 mg 3 times a day because of him sleeping a lot.

## 2017-06-06 NOTE — Telephone Encounter (Signed)
I have changed his gabapentin to 300 mg only at bedtime. [facility] also [facility]. Please advise him to increase fiber intake, water, ingest prunes, activia yoghurt and probiotics.

## 2017-06-06 NOTE — Telephone Encounter (Signed)
Will route to PCP 

## 2017-06-09 ENCOUNTER — Telehealth: Payer: Self-pay

## 2017-06-09 ENCOUNTER — Ambulatory Visit: Payer: Self-pay | Attending: Family Medicine

## 2017-06-09 NOTE — Telephone Encounter (Signed)
[facility] with [facility] calling to check on status of PT order, and also has an OT order to fax.

## 2017-06-13 ENCOUNTER — Telehealth: Payer: Self-pay | Admitting: Family Medicine

## 2017-06-13 ENCOUNTER — Ambulatory Visit (INDEPENDENT_AMBULATORY_CARE_PROVIDER_SITE_OTHER): Payer: Self-pay | Admitting: Pharmacist Clinician (PhC)/ Clinical Pharmacy Specialist

## 2017-06-13 DIAGNOSIS — Z7982 Long term (current) use of aspirin: Secondary | ICD-10-CM

## 2017-06-13 DIAGNOSIS — I82402 Acute embolism and thrombosis of unspecified deep veins of left lower extremity: Secondary | ICD-10-CM

## 2017-06-13 DIAGNOSIS — D649 Anemia, unspecified: Secondary | ICD-10-CM

## 2017-06-13 DIAGNOSIS — I5020 Unspecified systolic (congestive) heart failure: Secondary | ICD-10-CM

## 2017-06-13 DIAGNOSIS — I69354 Hemiplegia and hemiparesis following cerebral infarction affecting left non-dominant side: Secondary | ICD-10-CM

## 2017-06-13 DIAGNOSIS — I2510 Atherosclerotic heart disease of native coronary artery without angina pectoris: Secondary | ICD-10-CM

## 2017-06-13 DIAGNOSIS — E875 Hyperkalemia: Secondary | ICD-10-CM

## 2017-06-13 DIAGNOSIS — Z794 Long term (current) use of insulin: Secondary | ICD-10-CM

## 2017-06-13 DIAGNOSIS — N179 Acute kidney failure, unspecified: Secondary | ICD-10-CM

## 2017-06-13 DIAGNOSIS — Z5181 Encounter for therapeutic drug level monitoring: Secondary | ICD-10-CM

## 2017-06-13 DIAGNOSIS — Z951 Presence of aortocoronary bypass graft: Secondary | ICD-10-CM

## 2017-06-13 DIAGNOSIS — Z86711 Personal history of pulmonary embolism: Secondary | ICD-10-CM

## 2017-06-13 DIAGNOSIS — Z7901 Long term (current) use of anticoagulants: Secondary | ICD-10-CM

## 2017-06-13 DIAGNOSIS — E1140 Type 2 diabetes mellitus with diabetic neuropathy, unspecified: Secondary | ICD-10-CM

## 2017-06-13 DIAGNOSIS — I63411 Cerebral infarction due to embolism of right middle cerebral artery: Secondary | ICD-10-CM

## 2017-06-13 DIAGNOSIS — I110 Hypertensive heart disease with heart failure: Secondary | ICD-10-CM

## 2017-06-13 LAB — POCT INR: INR: 2.5

## 2017-06-13 NOTE — Telephone Encounter (Signed)
[facility] ([facility]) called to request a an order home visit once a week for 3 weeks, please follow up

## 2017-06-13 NOTE — Telephone Encounter (Signed)
Okay to give verbal order.

## 2017-06-16 ENCOUNTER — Encounter: Payer: Self-pay | Attending: Surgery | Admitting: Surgery

## 2017-06-16 DIAGNOSIS — L97512 Non-pressure chronic ulcer of other part of right foot with fat layer exposed: Secondary | ICD-10-CM | POA: Insufficient documentation

## 2017-06-16 DIAGNOSIS — Z86718 Personal history of other venous thrombosis and embolism: Secondary | ICD-10-CM | POA: Insufficient documentation

## 2017-06-16 DIAGNOSIS — I2510 Atherosclerotic heart disease of native coronary artery without angina pectoris: Secondary | ICD-10-CM | POA: Insufficient documentation

## 2017-06-16 DIAGNOSIS — I739 Peripheral vascular disease, unspecified: Secondary | ICD-10-CM | POA: Insufficient documentation

## 2017-06-16 DIAGNOSIS — I110 Hypertensive heart disease with heart failure: Secondary | ICD-10-CM | POA: Insufficient documentation

## 2017-06-16 DIAGNOSIS — E11621 Type 2 diabetes mellitus with foot ulcer: Secondary | ICD-10-CM | POA: Insufficient documentation

## 2017-06-16 DIAGNOSIS — E1140 Type 2 diabetes mellitus with diabetic neuropathy, unspecified: Secondary | ICD-10-CM | POA: Insufficient documentation

## 2017-06-16 DIAGNOSIS — I509 Heart failure, unspecified: Secondary | ICD-10-CM | POA: Insufficient documentation

## 2017-06-16 DIAGNOSIS — Z794 Long term (current) use of insulin: Secondary | ICD-10-CM | POA: Insufficient documentation

## 2017-06-16 DIAGNOSIS — Z79899 Other long term (current) drug therapy: Secondary | ICD-10-CM | POA: Insufficient documentation

## 2017-06-16 MED FILL — LACTULOSE Solution 10g/15ml: 10 | 21 days supply | Qty: 946 | Fill #0

## 2017-06-16 NOTE — Progress Notes (Addendum)
HERSCHELL, VIRANI ([id number]) Visit Report for 06/16/2017 Chief Complaint Document Details Patient Name: [name], [name] Date of Service: 06/16/2017 9:30 AM Medical Record Number: [id number] Patient Account Number: [id number] Date of Birth/Sex: 10-09-1945 (71 y.o. Male) Treating RN: Huel Coventry Primary Care Provider: Jaclyn Shaggy Other Clinician: Referring Provider: Leonides Sake Treating Provider/Extender: Rudene Re in Treatment: 0 Information Obtained from: Patient Chief Complaint Patient presents to the wound care center today with an open arterial ulcer to the right third toe which has been there for about 2 weeks Electronic Signature(s) Signed: 06/16/2017 10:59:02 AM By: Evlyn Kanner MD, FACS Entered By: Evlyn Kanner on 06/16/2017 10:59:02 [name] ([id number]) -------------------------------------------------------------------------------- HPI Details Patient Name: [name] Date of Service: 06/16/2017 9:30 AM Medical Record Number: [id number] Patient Account Number: [id number] Date of Birth/Sex: [birth year] (71 y.o. Male) Treating RN: Huel Coventry Primary Care Provider: Jaclyn Shaggy Other Clinician: Referring Provider: Leonides Sake Treating Provider/Extender: Rudene Re in Treatment: 0 History of Present Illness Location: right third toe Quality: Patient reports experiencing a sharp pain to affected area(s). Severity: Patient states wound are getting worse. Duration: Patient has had the wound for < 2 weeks prior to presenting for treatment Timing: Pain in wound is Intermittent (comes and goes Context: The wound would happen gradually Modifying Factors: Other treatment(s) tried include:had arterial surgery recently Associated Signs and Symptoms: Patient reports having increase discharge. HPI Description: 71 year old diabetic who has recently been reviewed by Dr. Leonides Sake for a postop follow-up for a right femoropopliteal artery, right BK popliteal with  BPA, and anastomosis to the right PT and AT. 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 The patient is having a third toe ulcer and there is some distal foot issues. the patient was recommended to have a duplex study in 2 months and at that time would be reevaluated. The patient was referred to our center for wound care. Past medical history significant for type 2 diabetes mellitus last hemoglobin A1c was 8.8%, previous CVA, coronary artery disease status post 2 vessel CABG in 2007, CHF,hyperlipidemia, hypertension, pulmonary embolus. The last ABI study done 04/27/2017 showed a right ABI of 0.55 and a left ABI of 0.94 with monophasic flow on the right and biphasic flow on the left Electronic Signature(s) Signed: 06/16/2017 11:01:58 AM By: Evlyn Kanner MD, FACS Previous Signature: 06/16/2017 10:08:26 AM Version By: Evlyn Kanner MD, FACS Previous Signature: 06/16/2017 10:05:33 AM Version By: Evlyn Kanner MD, FACS Previous Signature: 06/16/2017 10:05:24 AM Version By: Evlyn Kanner MD, FACS Entered By: Evlyn Kanner on 06/16/2017 11:01:57 [name] ([id number]) -------------------------------------------------------------------------------- Physical Exam Details Patient Name: [name] Date of Service: 06/16/2017 9:30 AM Medical Record Number: [id number] Patient Account Number: [id number] Date of Birth/Sex: 09/13/1945 (71 y.o. Male) Treating RN: Huel Coventry Primary Care Provider: Jaclyn Shaggy Other Clinician: Referring Provider: Leonides Sake Treating Provider/Extender: Rudene Re in Treatment: 0 Constitutional . Pulse regular. Respirations normal and unlabored. Afebrile. . Eyes Nonicteric. Reactive to light. Ears, Nose, Mouth, and Throat Lips, teeth, and gums WNL.Marland Kitchen Moist mucosa without lesions. Neck supple and nontender. No palpable supraclavicular or cervical adenopathy. Normal sized without  goiter. Respiratory WNL. No retractions.. Cardiovascular Pedal Pulses WNL. ABI could not be measured. No clubbing, cyanosis or edema. Gastrointestinal (GI) Abdomen without masses or tenderness.. No liver or spleen enlargement or tenderness.. Lymphatic No adneopathy. No adenopathy. No adenopathy. Musculoskeletal Adexa without tenderness or enlargement.. Digits and nails w/o clubbing, cyanosis, infection, petechiae, ischemia, or inflammatory conditions.. Integumentary (  Hair, Skin) No suspicious lesions. No crepitus or fluctuance. No peri-wound warmth or erythema. No masses.Marland Kitchen Psychiatric Judgement and insight Intact.. No evidence of depression, anxiety, or agitation.. Notes the patient has some moisture between on his toes and also has minimal slough over the nailbed on the right third toe and this is very tender. No sharp debridement was attempted. Electronic Signature(s) Signed: 06/16/2017 11:02:39 AM By: Evlyn Kanner MD, FACS Entered By: Evlyn Kanner on 06/16/2017 11:02:38 [name] ([id number]) -------------------------------------------------------------------------------- Physician Orders Details Patient Name: [name] Date of Service: 06/16/2017 9:30 AM Medical Record Number: [id number] Patient Account Number: [id number] Date of Birth/Sex: 02-16-1946 (71 y.o. Male) Treating RN: Huel Coventry Primary Care Provider: Jaclyn Shaggy Other Clinician: Referring Provider: Leonides Sake Treating Provider/Extender: Rudene Re in Treatment: 0 Verbal / Phone Orders: No Diagnosis Coding Wound Cleansing Wound #1 Right Toe Third o Clean wound with Normal Saline. o Cleanse wound with mild soap and water o May Shower, gently pat wound dry prior to applying new dressing. o No tub bath. Anesthetic Wound #1 Right Toe Third o Topical Lidocaine 4% cream applied to wound bed prior to debridement Primary Wound Dressing Wound #1 Right Toe Third o Other: - Silvercell  rope Secondary Dressing Wound #1 Right Toe Third o Conform/Kerlix - stretch net Dressing Change Frequency Wound #1 Right Toe Third o Change dressing every other day. Follow-up Appointments Wound #1 Right Toe Third o Return Appointment in 1 week. Electronic Signature(s) Signed: 06/16/2017 12:32:19 PM By: Evlyn Kanner MD, FACS Signed: 06/16/2017 5:39:55 PM By: Elliot Gurney BSN, RN, CWS, Kim RN, BSN Entered By: Elliot Gurney, BSN, RN, CWS, Kim on 06/16/2017 10:58:23 [name] ([id number]) -------------------------------------------------------------------------------- Problem List Details Patient Name: [name] Date of Service: 06/16/2017 9:30 AM Medical Record Number: [id number] Patient Account Number: [id number] Date of Birth/Sex: [birth year] (71 y.o. Male) Treating RN: Huel Coventry Primary Care Provider: Jaclyn Shaggy Other Clinician: Referring Provider: Leonides Sake Treating Provider/Extender: Rudene Re in Treatment: 0 Active Problems ICD-10 Encounter Code Description Active Date Diagnosis E11.621 Type 2 diabetes mellitus with foot ulcer 06/16/2017 Yes I73.9 Peripheral vascular disease, unspecified 06/16/2017 Yes L97.512 Non-pressure chronic ulcer of other part of right foot with fat layer 06/16/2017 Yes exposed Inactive Problems Resolved Problems Electronic Signature(s) Signed: 06/16/2017 10:58:20 AM By: Evlyn Kanner MD, FACS Entered By: Evlyn Kanner on 06/16/2017 10:58:20 [name] ([id number]) -------------------------------------------------------------------------------- Progress Note Details Patient Name: [name] Date of Service: 06/16/2017 9:30 AM Medical Record Number: [id number] Patient Account Number: [id number] Date of Birth/Sex: [birth year] (71 y.o. Male) Treating RN: Huel Coventry Primary Care Provider: Jaclyn Shaggy Other Clinician: Referring Provider: Leonides Sake Treating Provider/Extender: Rudene Re in Treatment:  0 Subjective Chief Complaint Information obtained from Patient Patient presents to the wound care center today with an open arterial ulcer to the right third toe which has been there for about 2 weeks History of Present Illness (HPI) The following HPI elements were documented for the patient's wound: Location: right third toe Quality: Patient reports experiencing a sharp pain to affected area(s). Severity: Patient states wound are getting worse. Duration: Patient has had the wound for < 2 weeks prior to presenting for treatment Timing: Pain in wound is Intermittent (comes and goes Context: The wound would happen gradually Modifying Factors: Other treatment(s) tried include:had arterial surgery recently Associated Signs and Symptoms: Patient reports having increase discharge. 71 year old diabetic who has recently been reviewed by Dr. Leonides Sake for a postop follow-up for a right femoropopliteal artery, right BK popliteal with BPA, and  anastomosis to the right PT and AT. 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 The patient is having a third toe ulcer and there is some distal foot issues. the patient was recommended to have a duplex study in 2 months and at that time would be reevaluated. The patient was referred to our center for wound care. Past medical history significant for type 2 diabetes mellitus last hemoglobin A1c was 8.8%, previous CVA, coronary artery disease status post 2 vessel CABG in 2007, CHF,hyperlipidemia, hypertension, pulmonary embolus. The last ABI study done 04/27/2017 showed a right ABI of 0.55 and a left ABI of 0.94 with monophasic flow on the right and biphasic flow on the left Wound History Patient presents with 1 open wound that has been present for approximately months. Patient has been treating wound in the following manner: Antibiotics. Laboratory tests have been performed in the last month. Patient  reportedly has not tested positive for an antibiotic resistant organism. Patient reportedly has had testing performed to evaluate circulation in the legs. Patient History Information obtained from Caregiver, Chart. Allergies No Known Drug Allergies Social History Never smoker, Marital Status - Married, Alcohol Use - Never, Drug Use - No History, Caffeine Use - Daily. Medical History Eyes Denies history of Cataracts, Glaucoma, Optic Neuritis Ear/Nose/Mouth/Throat [name], [name] ([id number]) Denies history of Chronic sinus problems/congestion, Middle ear problems Hematologic/Lymphatic Denies history of Anemia, Hemophilia, Human Immunodeficiency Virus, Lymphedema, Sickle Cell Disease Respiratory Denies history of Aspiration, Asthma, Chronic Obstructive Pulmonary Disease (COPD), Pneumothorax, Sleep Apnea, Tuberculosis Cardiovascular Patient has history of Congestive Heart Failure, Coronary Artery Disease, Deep Vein Thrombosis, Hypertension - controlled with meds, Peripheral Arterial Disease Denies history of Angina, Arrhythmia, Hypotension, Myocardial Infarction, Peripheral Venous Disease, Phlebitis, Vasculitis Gastrointestinal Denies history of Cirrhosis , Colitis, Crohn s, Hepatitis A, Hepatitis B, Hepatitis C Endocrine Patient has history of Type II Diabetes Denies history of Type I Diabetes Genitourinary Denies history of End Stage Renal Disease Immunological Denies history of Lupus Erythematosus, Raynaud s, Scleroderma Integumentary (Skin) Denies history of History of Burn, History of pressure wounds Musculoskeletal Denies history of Gout, Rheumatoid Arthritis, Osteoarthritis, Osteomyelitis Neurologic Patient has history of Neuropathy Denies history of Dementia, Quadriplegia, Paraplegia, Seizure Disorder Oncologic Denies history of Received Chemotherapy, Received Radiation Psychiatric Denies history of Anorexia/bulimia, Confinement Anxiety Patient is treated with Insulin.  Blood sugar is tested. Blood sugar results noted at the following times: Breakfast - 131, Bedtime - 230. Hospitalization/Surgery History - 04/28/2017, [place], SOB. Review of Systems (ROS) Constitutional Symptoms (General Health) Complains or has symptoms of Fatigue. Denies complaints or symptoms of Fever, Chills, Marked Weight Change. Eyes Complains or has symptoms of Vision Changes. Denies complaints or symptoms of Dry Eyes, Glasses / Contacts. Ear/Nose/Mouth/Throat The patient has no complaints or symptoms. Hematologic/Lymphatic The patient has no complaints or symptoms. Respiratory Complains or has symptoms of Shortness of Breath. Denies complaints or symptoms of Chronic or frequent coughs. Cardiovascular The patient has no complaints or symptoms. Gastrointestinal The patient has no complaints or symptoms. Endocrine The patient has no complaints or symptoms. Genitourinary The patient has no complaints or symptoms. Immunological The patient has no complaints or symptoms. [name], [name] ([id number]) Integumentary (Skin) Complains or has symptoms of Wounds. Denies complaints or symptoms of Bleeding or bruising tendency, Breakdown, Swelling. Musculoskeletal Complains or has symptoms of Muscle Weakness, Left size stroke Neurologic The patient has no complaints or symptoms. Oncologic The patient has no complaints or symptoms. Psychiatric The patient has  no complaints or symptoms. Medications acetaminophen 325 mg tablet oral tablet oral tramadol 50 mg tablet oral tablet oral warfarin 7.5 mg tablet oral tablet oral atorvastatin 80 mg tablet oral tablet oral losartan 25 mg tablet oral tablet oral cephalexin 500 mg capsule oral capsule oral Lantus U-100 Insulin 100 unit/mL subcutaneous solution subcutaneous solution subcutaneous Novolog U-100 Insulin aspart 100 unit/mL subcutaneous solution subcutaneous solution subcutaneous docusate sodium 100 mg capsule oral capsule  oral furosemide 80 mg tablet oral tablet oral aspirin 81 mg tablet,delayed release oral tablet,delayed release (DR/EC) oral gabapentin ER 300 mg tablet,extended release 24 hr oral tablet extended release 24 hr oral Aldactone 25 mg tablet oral one half tablet oral 12.5 mg.) pantoprazole 40 mg tablet,delayed release oral tablet,delayed release (DR/EC) oral Objective Constitutional Pulse regular. Respirations normal and unlabored. Afebrile. Vitals Time Taken: 9:54 AM, Height: 70 in, Source: Stated, Weight: 177 lbs, Source: Stated, BMI: 25.4, Temperature: 98.2 F, Pulse: 94 bpm, Respiratory Rate: 20 breaths/min, Blood Pressure: 111/65 mmHg. Eyes Nonicteric. Reactive to light. Ears, Nose, Mouth, and Throat Lips, teeth, and gums WNL.Marland Kitchen Moist mucosa without lesions. Neck supple and nontender. No palpable supraclavicular or cervical adenopathy. Normal sized without goiter. Respiratory WNL. No retractions.Marland Kitchen [name], [name] ([id number]) Cardiovascular Pedal Pulses WNL. ABI could not be measured. No clubbing, cyanosis or edema. Gastrointestinal (GI) Abdomen without masses or tenderness.. No liver or spleen enlargement or tenderness.. Lymphatic No adneopathy. No adenopathy. No adenopathy. Musculoskeletal Adexa without tenderness or enlargement.. Digits and nails w/o clubbing, cyanosis, infection, petechiae, ischemia, or inflammatory conditions.Marland Kitchen Psychiatric Judgement and insight Intact.. No evidence of depression, anxiety, or agitation.. General Notes: the patient has some moisture between on his toes and also has minimal slough over the nailbed on the right third toe and this is very tender. No sharp debridement was attempted. Integumentary (Hair, Skin) No suspicious lesions. No crepitus or fluctuance. No peri-wound warmth or erythema. No masses.. Wound #1 status is Open. Original cause of wound was Trauma. The wound is located on the Right Toe Third. The wound measures 1.2cm length x 1.5cm  width x 0.1cm depth; 1.414cm^2 area and 0.141cm^3 volume. The wound is limited to skin breakdown. There is no tunneling or undermining noted. There is a medium amount of serous drainage noted. The wound margin is indistinct and nonvisible. There is no granulation within the wound bed. There is a large (67-100%) amount of necrotic tissue within the wound bed including Adherent Slough. General Notes: Patient's toenail is missing on 3rd toe. Assessment Active Problems ICD-10 E11.621 - Type 2 diabetes mellitus with foot ulcer I73.9 - Peripheral vascular disease, unspecified L97.512 - Non-pressure chronic ulcer of other part of right foot with fat layer exposed This 71 year old diabetic who has significant peripheral arterial disease has had workup and surgery done recently and is here for a nonhealing wound on the third toe which is very tender due to his arterial pain. After review and discussing with the daughter who is the caregiver and translates from friends language I have recommended: 1. Silver alginate to be placed between his toes with special emphasis on the right third toe. 2. adequate control of his diabetes mellitus as treated and managed by his PCP 3. adequate protein, vitamin A, vitamin C and zinc 4. Regular visits to the wound center [place], Amandeep ([id number]) Plan Wound Cleansing: Wound #1 Right Toe Third: Clean wound with Normal Saline. Cleanse wound with mild soap and water May Shower, gently pat wound dry prior to applying new dressing. No tub bath. Anesthetic:  Wound #1 Right Toe Third: Topical Lidocaine 4% cream applied to wound bed prior to debridement Primary Wound Dressing: Wound #1 Right Toe Third: Other: - Silvercell rope Secondary Dressing: Wound #1 Right Toe Third: Conform/Kerlix - stretch net Dressing Change Frequency: Wound #1 Right Toe Third: Change dressing every other day. Follow-up Appointments: Wound #1 Right Toe Third: Return Appointment in 1  week. This 71 year old diabetic who has significant peripheral arterial disease has had workup and surgery done recently and is here for a nonhealing wound on the third toe which is very tender due to his arterial pain. After review and discussing with the daughter who is the caregiver and translates from friends language I have recommended: 1. Silver alginate to be placed between his toes with special emphasis on the right third toe. 2. adequate control of his diabetes mellitus as treated and managed by his PCP 3. adequate protein, vitamin A, vitamin C and zinc 4. Regular visits to the wound center Electronic Signature(s) Signed: 06/16/2017 11:05:21 AM By: Evlyn Kanner MD, FACS Entered By: Evlyn Kanner on 06/16/2017 11:05:21 [name] ([id number]) -------------------------------------------------------------------------------- ROS/PFSH Details Patient Name: [name] Date of Service: 06/16/2017 9:30 AM Medical Record Number: [id number] Patient Account Number: [id number] Date of Birth/Sex: June 24, 1946 (71 y.o. Male) Treating RN: Huel Coventry Primary Care Provider: Jaclyn Shaggy Other Clinician: Referring Provider: Leonides Sake Treating Provider/Extender: Rudene Re in Treatment: 0 Information Obtained From Caregiver Chart Wound History Do you currently have one or more open woundso Yes How many open wounds do you currently haveo 1 Approximately how long have you had your woundso months How have you been treating your wound(s) until nowo Antibiotics Has your wound(s) ever healed and then re-openedo No Have you had any lab work done in the past montho Yes Have you tested positive for an antibiotic resistant organism (MRSA, VRE)o No Have you had any tests for circulation on your legso Yes Constitutional Symptoms (General Health) Complaints and Symptoms: Positive for: Fatigue Negative for: Fever; Chills; Marked Weight Change Eyes Complaints and Symptoms: Positive for:  Vision Changes Negative for: Dry Eyes; Glasses / Contacts Medical History: Negative for: Cataracts; Glaucoma; Optic Neuritis Respiratory Complaints and Symptoms: Positive for: Shortness of Breath Negative for: Chronic or frequent coughs Medical History: Negative for: Aspiration; Asthma; Chronic Obstructive Pulmonary Disease (COPD); Pneumothorax; Sleep Apnea; Tuberculosis Integumentary (Skin) Complaints and Symptoms: Positive for: Wounds Negative for: Bleeding or bruising tendency; Breakdown; Swelling Medical History: Negative for: History of Burn; History of pressure wounds Musculoskeletal Complaints and Symptoms: Positive for: Muscle Weakness [name], [name] ([id number]) Review of System Notes: Left size stroke Medical History: Negative for: Gout; Rheumatoid Arthritis; Osteoarthritis; Osteomyelitis Ear/Nose/Mouth/Throat Complaints and Symptoms: No Complaints or Symptoms Medical History: Negative for: Chronic sinus problems/congestion; Middle ear problems Hematologic/Lymphatic Complaints and Symptoms: No Complaints or Symptoms Medical History: Negative for: Anemia; Hemophilia; Human Immunodeficiency Virus; Lymphedema; Sickle Cell Disease Cardiovascular Complaints and Symptoms: No Complaints or Symptoms Medical History: Positive for: Congestive Heart Failure; Coronary Artery Disease; Deep Vein Thrombosis; Hypertension - controlled with meds; Peripheral Arterial Disease Negative for: Angina; Arrhythmia; Hypotension; Myocardial Infarction; Peripheral Venous Disease; Phlebitis; Vasculitis Gastrointestinal Complaints and Symptoms: No Complaints or Symptoms Medical History: Negative for: Cirrhosis ; Colitis; Crohnos; Hepatitis A; Hepatitis B; Hepatitis C Endocrine Complaints and Symptoms: No Complaints or Symptoms Medical History: Positive for: Type II Diabetes Negative for: Type I Diabetes Time with diabetes: 10 years Treated with: Insulin Blood sugar tested every day:  Yes Tested : 2-3 Blood sugar testing results: Breakfast: 131; Bedtime:  230 Genitourinary Complaints and Symptoms: No Complaints or Symptoms [name], [name] ([id number]) Medical History: Negative for: End Stage Renal Disease Immunological Complaints and Symptoms: No Complaints or Symptoms Medical History: Negative for: Lupus Erythematosus; Raynaudos; Scleroderma Neurologic Complaints and Symptoms: No Complaints or Symptoms Medical History: Positive for: Neuropathy Negative for: Dementia; Quadriplegia; Paraplegia; Seizure Disorder Oncologic Complaints and Symptoms: No Complaints or Symptoms Medical History: Negative for: Received Chemotherapy; Received Radiation Psychiatric Complaints and Symptoms: No Complaints or Symptoms Medical History: Negative for: Anorexia/bulimia; Confinement Anxiety Immunizations Pneumococcal Vaccine: Received Pneumococcal Vaccination: Yes Implantable Devices Hospitalization / Surgery History Name of Hospital Purpose of Hospitalization/Surgery Date [facility] SOB 04/28/2017 Family and Social History Never smoker; Marital Status - Married; Alcohol Use: Never; Drug Use: No History; Caffeine Use: Daily; Advanced Directives: No; Patient does not want information on Advanced Directives; Do not resuscitate: No; Living Will: No; Medical Power of Attorney: No Physician Affirmation I have reviewed and agree with the above information. Electronic Signature(s) Signed: 06/16/2017 12:32:19 PM By: Evlyn KannerBritto, Tyric Rodeheaver MD, FACS Signed: 06/16/2017 5:39:55 PM By: Elliot GurneyWoody, BSN, RN, CWS, Kim RN, BSN [place], [name] ([id number]) Entered By: Evlyn KannerBritto, Damaris Abeln on 06/16/2017 10:57:24 Devin RhymeSANGARE, [name] ([id number]) -------------------------------------------------------------------------------- SuperBill Details Patient Name: Devin RhymeSANGARE, Galvin Date of Service: 06/16/2017 Medical Record Number: [id number] Patient Account Number: [id number] Date of Birth/Sex: 1946-06-16 (71 y.o. Male) Treating  RN: Huel CoventryWoody, Kim Primary Care Provider: Jaclyn ShaggyAMAO, ENOBONG Other Clinician: Referring Provider: Leonides SakeHEN, BRIAN Treating Provider/Extender: Rudene ReBritto, Katessa Attridge Weeks in Treatment: 0 Diagnosis Coding ICD-10 Codes Code Description E11.621 Type 2 diabetes mellitus with foot ulcer I73.9 Peripheral vascular disease, unspecified L97.512 Non-pressure chronic ulcer of other part of right foot with fat layer exposed Facility Procedures CPT4 Code: [id number] Description: 99214 - WOUND CARE VISIT-LEV 4 EST PT Modifier: Quantity: 1 Physician Procedures CPT4 Code: [id number] Description: 99204 - WC PHYS LEVEL 4 - NEW PT ICD-10 Diagnosis Description E11.621 Type 2 diabetes mellitus with foot ulcer I73.9 Peripheral vascular disease, unspecified L97.512 Non-pressure 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

## 2017-06-17 NOTE — Progress Notes (Signed)
CIARAN, BEGAY ([id number]) Visit Report for 06/16/2017 Abuse/Suicide Risk Screen Details Patient Name: [name], [name] Date of Service: 06/16/2017 9:30 AM Medical Record Number: [id number] Patient Account Number: [id number] Date of Birth/Sex: 1946/02/22 (71 y.o. Male) Treating RN: Huel Coventry Primary Care Cabrini Ruggieri: Jaclyn Shaggy Other Clinician: Referring Taariq Leitz: Leonides Sake Treating Pippa Hanif/Extender: Rudene Re in Treatment: 0 Abuse/Suicide Risk Screen Items Answer ABUSE/SUICIDE RISK SCREEN: Has anyone close to you tried to hurt or harm you recentlyo No Do you feel uncomfortable with anyone in your familyo No Has anyone forced you do things that you didnot want to doo No Do you have any thoughts of harming yourselfo No Patient displays signs or symptoms of abuse and/or neglect. No Electronic Signature(s) Signed: 06/16/2017 5:39:55 PM By: Elliot Gurney, BSN, RN, CWS, Kim RN, BSN Entered By: Elliot Gurney, BSN, RN, CWS, Kim on 06/16/2017 10:17:37 [name] ([id number]) -------------------------------------------------------------------------------- Activities of Daily Living Details Patient Name: [name] Date of Service: 06/16/2017 9:30 AM Medical Record Number: [id number] Patient Account Number: [id number] Date of Birth/Sex: 27-Jun-1946 (71 y.o. Male) Treating RN: Huel Coventry Primary Care Cordae Mccarey: Jaclyn Shaggy Other Clinician: Referring Huey Scalia: Leonides Sake Treating Latina Frank/Extender: Rudene Re in Treatment: 0 Activities of Daily Living Items Answer Activities of Daily Living (Please select one for each item) Drive Automobile Not Able Take Medications Not Able Use Telephone Not Able Care for Appearance Not Able Use Toilet Need Assistance Bath / Shower Need Assistance Dress Self Need Assistance Feed Self Need Assistance Walk Need Assistance Get In / Out Bed Need Assistance Housework Need Assistance Prepare Meals Need Assistance Handle Money Not Able Shop for  Self Not Able Electronic Signature(s) Signed: 06/16/2017 5:39:55 PM By: Elliot Gurney, BSN, RN, CWS, Kim RN, BSN Entered By: Elliot Gurney, BSN, RN, CWS, Kim on 06/16/2017 10:18:27 [name] ([id number]) -------------------------------------------------------------------------------- Education Assessment Details Patient Name: [name] Date of Service: 06/16/2017 9:30 AM Medical Record Number: [id number] Patient Account Number: [id number] Date of Birth/Sex: 1946-07-06 (71 y.o. Male) Treating RN: Huel Coventry Primary Care Esdras Delair: Jaclyn Shaggy Other Clinician: Referring Trystian Crisanto: Leonides Sake Treating Lora Glomski/Extender: Rudene Re in Treatment: 0 Primary Learner Assessed: Caregiver Learning Preferences/Education Level/Primary Language Learning Preference: Explanation, Demonstration Highest Education Level: College or Above Preferred Language: [place] Cognitive Barrier Assessment/Beliefs Language Barrier: Corporate investment banker Needed: Yes Memory Deficit: Yes Emotional Barrier: No Cultural/Religious Beliefs Affecting Medical Care: No Physical Barrier Assessment Impaired Vision: Yes Glasses Impaired Hearing: No Decreased Hand dexterity: No Knowledge/Comprehension Assessment Knowledge Level: High Comprehension Level: High Ability to understand written High instructions: Ability to understand verbal High instructions: Motivation Assessment Anxiety Level: Calm Cooperation: Cooperative Education Importance: Acknowledges Need Interest in Health Problems: Asks Questions Perception: Coherent Willingness to Engage in Self- High Management Activities: Readiness to Engage in Self- High Management Activities: Electronic Signature(s) Signed: 06/16/2017 5:39:55 PM By: Elliot Gurney, BSN, RN, CWS, Kim RN, BSN Entered By: Elliot Gurney, BSN, RN, CWS, Kim on 06/16/2017 10:19:24 [name] ([id number]) -------------------------------------------------------------------------------- Fall Risk Assessment  Details Patient Name: [name] Date of Service: 06/16/2017 9:30 AM Medical Record Number: [id number] Patient Account Number: [id number] Date of Birth/Sex: September 19, 1945 (71 y.o. Male) Treating RN: Huel Coventry Primary Care Lilianne Delair: Jaclyn Shaggy Other Clinician: Referring Leidi Astle: Leonides Sake Treating Panayiota Larkin/Extender: Rudene Re in Treatment: 0 Fall Risk Assessment Items Have you had 2 or more falls in the last 12 monthso 0 No Have you had any fall that resulted in injury in the last 12 monthso 0 No FALL RISK ASSESSMENT: History of falling - immediate or within 3 months 0 No Secondary  diagnosis 15 Yes Ambulatory aid None/bed rest/wheelchair/nurse 0 Yes Crutches/cane/walker 15 Yes Furniture 0 No IV Access/Saline Lock 0 No Gait/Training Normal/bed rest/immobile 0 Yes Weak 10 Yes Impaired 0 No Mental Status Oriented to own ability 0 No Electronic Signature(s) Signed: 06/16/2017 5:39:55 PM By: Elliot GurneyWoody, BSN, RN, CWS, Kim RN, BSN Entered By: Elliot GurneyWoody, BSN, RN, CWS, Kim on 06/16/2017 10:19:57 Devin RhymeSANGARE, Calvin ([id number]) -------------------------------------------------------------------------------- Foot Assessment Details Patient Name: [name] Date of Service: 06/16/2017 9:30 AM Medical Record Number: [id number] Patient Account Number: [id number] Date of Birth/Sex: 13-Sep-1945 (71 y.o. Male) Treating RN: Huel CoventryWoody, Kim Primary Care Lenford Beddow: Jaclyn ShaggyAMAO, ENOBONG Other Clinician: Referring Njeri Vicente: Leonides SakeHEN, BRIAN Treating Secundino Ellithorpe/Extender: Rudene ReBritto, Errol Weeks in Treatment: 0 Foot Assessment Items [x]  Unable to perform due to altered mental status Site Locations + = Sensation present, - = Sensation absent, C = Callus, U = Ulcer R = Redness, W = Warmth, M = Maceration, PU = Pre-ulcerative lesion F = Fissure, S = Swelling, D = Dryness Assessment Right: Left: Other Deformity: No No Prior Foot Ulcer: No No Prior Amputation: No No Charcot Joint: No No Ambulatory Status: Ambulatory  With Help Assistance Device: Wheelchair Gait: Surveyor, miningUnsteady Electronic Signature(s) Signed: 06/16/2017 5:39:55 PM By: Elliot GurneyWoody, BSN, RN, CWS, Kim RN, BSN Entered By: Elliot GurneyWoody, BSN, RN, CWS, Kim on 06/16/2017 10:20:44 Devin RhymeSANGARE, Hadriel ([id number]) -------------------------------------------------------------------------------- Nutrition Risk Assessment Details Patient Name: Devin RhymeSANGARE, Dylon Date of Service: 06/16/2017 9:30 AM Medical Record Number: [id number] Patient Account Number: [id number] Date of Birth/Sex: 13-Sep-1945 (71 y.o. Male) Treating RN: Huel CoventryWoody, Kim Primary Care Niklaus Mamaril: Jaclyn ShaggyAMAO, ENOBONG Other Clinician: Referring Deaun Rocha: Leonides SakeHEN, BRIAN Treating Helem Reesor/Extender: Rudene ReBritto, Errol Weeks in Treatment: 0 Height (in): 70 Weight (lbs): 177 Body Mass Index (BMI): 25.4 Nutrition Risk Assessment Items NUTRITION RISK SCREEN: I have an illness or condition that made me change the kind and/or amount of 0 No food I eat I eat fewer than two meals per day 0 No I eat few fruits and vegetables, or milk products 2 Yes I have three or more drinks of beer, liquor or wine almost every day 0 No I have tooth or mouth problems that make it hard for me to eat 0 No I don't always have enough money to buy the food I need 0 No I eat alone most of the time 0 No I take three or more different prescribed or over-the-counter drugs a day 1 Yes Without wanting to, I have lost or gained 10 pounds in the last six months 0 No I am not always physically able to shop, cook and/or feed myself 0 No Nutrition Protocols Good Risk Protocol Provide education on elevated blood sugars and Moderate Risk Protocol 0 impact on wound healing, as applicable Electronic Signature(s) Signed: 06/16/2017 5:39:55 PM By: Elliot GurneyWoody, BSN, RN, CWS, Kim RN, BSN Entered By: Elliot GurneyWoody, BSN, RN, CWS, Kim on 06/16/2017 01:02:7210:20:22

## 2017-06-17 NOTE — Progress Notes (Signed)
[name], [name] ([id number]) Visit Report for 06/16/2017 Allergy List Details Patient Name: [name], [name] Date of Service: 06/16/2017 9:30 AM Medical Record Number: [id number] Patient Account Number: [id number] Date of Birth/Sex: 08-25-1945 (71 y.o. Male) Treating RN: Huel Coventry Primary Care Ermal Haberer: Jaclyn Shaggy Other Clinician: Referring Mattheus Rauls: Leonides Sake Treating Denishia Citro/Extender: Rudene Re in Treatment: 0 Allergies Active Allergies No Known Drug Allergies Allergy Notes Electronic Signature(s) Signed: 06/16/2017 5:39:55 PM By: Elliot Gurney, BSN, RN, CWS, Kim RN, BSN Entered By: Elliot Gurney, BSN, RN, CWS, Kim on 06/16/2017 10:07:54 [name] ([id number]) -------------------------------------------------------------------------------- Arrival Information Details Patient Name: [name] Date of Service: 06/16/2017 9:30 AM Medical Record Number: [id number] Patient Account Number: [id number] Date of Birth/Sex: [birth year] (71 y.o. Male) Treating RN: Huel Coventry Primary Care Babacar Haycraft: Jaclyn Shaggy Other Clinician: Referring Masin Shatto: Leonides Sake Treating Annett Boxwell/Extender: Rudene Re in Treatment: 0 Visit Information Patient Arrived: Wheel Chair Arrival Time: 09:50 Accompanied By: daughter Transfer Assistance: Manual Patient Identification Verified: Yes Secondary Verification Process Yes Completed: Patient Requires Transmission-Based No Precautions: Patient Has Alerts: Yes Patient Alerts: Patient on Blood Thinner Translator Required TYPE II DIABETIC ABI: inaudible pulse Electronic Signature(s) Signed: 06/16/2017 5:39:55 PM By: Elliot Gurney, BSN, RN, CWS, Kim RN, BSN Entered By: Elliot Gurney, BSN, RN, CWS, Kim on 06/16/2017 10:22:21 [name] ([id number]) -------------------------------------------------------------------------------- Clinic Level of Care Assessment Details Patient Name: [name] Date of Service: 06/16/2017 9:30 AM Medical Record Number:  [id number] Patient Account Number: [id number] Date of Birth/Sex: 1945-10-04 (71 y.o. Male) Treating RN: Huel Coventry Primary Care Lailoni Baquera: Jaclyn Shaggy Other Clinician: Referring Trellis Vanoverbeke: Leonides Sake Treating Jaynee Winters/Extender: Rudene Re in Treatment: 0 Clinic Level of Care Assessment Items TOOL 2 Quantity Score []  - Use when only an EandM is performed on the INITIAL visit 0 ASSESSMENTS - Nursing Assessment / Reassessment X - General Physical Exam (combine w/ comprehensive assessment (listed just below) when 1 20 performed on new pt. evals) X- 1 25 Comprehensive Assessment (HX, ROS, Risk Assessments, Wounds Hx, etc.) ASSESSMENTS - Wound and Skin Assessment / Reassessment X - Simple Wound Assessment / Reassessment - one wound 1 5 []  - 0 Complex Wound Assessment / Reassessment - multiple wounds []  - 0 Dermatologic / Skin Assessment (not related to wound area) ASSESSMENTS - Ostomy and/or Continence Assessment and Care []  - Incontinence Assessment and Management 0 []  - 0 Ostomy Care Assessment and Management (repouching, etc.) PROCESS - Coordination of Care X - Simple Patient / Family Education for ongoing care 1 15 []  - 0 Complex (extensive) Patient / Family Education for ongoing care []  - 0 Staff obtains Chiropractor, Records, Test Results / Process Orders []  - 0 Staff telephones HHA, Nursing Homes / Clarify orders / etc []  - 0 Routine Transfer to another Facility (non-emergent condition) []  - 0 Routine Hospital Admission (non-emergent condition) X- 1 15 New Admissions / Manufacturing engineer / Ordering NPWT, Apligraf, etc. []  - 0 Emergency Hospital Admission (emergent condition) X- 1 10 Simple Discharge Coordination []  - 0 Complex (extensive) Discharge Coordination PROCESS - Special Needs []  - Pediatric / Minor Patient Management 0 []  - 0 Isolation Patient Management [name], [name] ([id number]) []  - 0 Hearing / Language / Visual special needs []  -  0 Assessment of Community assistance (transportation, D/C planning, etc.) []  - 0 Additional assistance / Altered mentation []  - 0 Support Surface(s) Assessment (bed, cushion, seat, etc.) INTERVENTIONS - Wound Cleansing / Measurement X - Wound Imaging (photographs - any number of wounds) 1 5 []  - 0 Wound Tracing (instead of photographs) X-  1 5 Simple Wound Measurement - one wound []  - 0 Complex Wound Measurement - multiple wounds X- 1 5 Simple Wound Cleansing - one wound []  - 0 Complex Wound Cleansing - multiple wounds INTERVENTIONS - Wound Dressings []  - Small Wound Dressing one or multiple wounds 0 X- 1 15 Medium Wound Dressing one or multiple wounds []  - 0 Large Wound Dressing one or multiple wounds []  - 0 Application of Medications - injection INTERVENTIONS - Miscellaneous []  - External ear exam 0 []  - 0 Specimen Collection (cultures, biopsies, blood, body fluids, etc.) []  - 0 Specimen(s) / Culture(s) sent or taken to Lab for analysis []  - 0 Patient Transfer (multiple staff / Nurse, adult / Similar devices) []  - 0 Simple Staple / Suture removal (25 or less) []  - 0 Complex Staple / Suture removal (26 or more) []  - 0 Hypo / Hyperglycemic Management (close monitor of Blood Glucose) []  - 0 Ankle / Brachial Index (ABI) - do not check if billed separately Has the patient been seen at the hospital within the last three years: Yes Total Score: 120 Level Of Care: New/Established - Level 4 Electronic Signature(s) Signed: 06/16/2017 5:39:55 PM By: Elliot Gurney, BSN, RN, CWS, Kim RN, BSN Entered By: Elliot Gurney, BSN, RN, CWS, Kim on 06/16/2017 12:05:59 [name] ([id number]) -------------------------------------------------------------------------------- Encounter Discharge Information Details Patient Name: [name] Date of Service: 06/16/2017 9:30 AM Medical Record Number: [id number] Patient Account Number: [id number] Date of Birth/Sex: 25-Jun-1946 (71 y.o. Male) Treating RN:  Huel Coventry Primary Care Dyllan Kats: Jaclyn Shaggy Other Clinician: Referring Rebecka Oelkers: Leonides Sake Treating Rebeka Kimble/Extender: Rudene Re in Treatment: 0 Encounter Discharge Information Items Discharge Condition: Stable Ambulatory Status: Wheelchair Discharge Destination: Home Transportation: Private Auto Accompanied By: daughter Schedule Follow-up Appointment: Yes Medication Reconciliation completed and Yes provided to Patient/Care Monifah Freehling: Provided on Clinical Summary of Care: 06/16/2017 Form Type Recipient Paper Patient IS Electronic Signature(s) Signed: 06/16/2017 12:50:08 PM By: Elliot Gurney, BSN, RN, CWS, Kim RN, BSN Entered By: Elliot Gurney, BSN, RN, CWS, Kim on 06/16/2017 12:50:08 [name] ([id number]) -------------------------------------------------------------------------------- Lower Extremity Assessment Details Patient Name: [name] Date of Service: 06/16/2017 9:30 AM Medical Record Number: [id number] Patient Account Number: [id number] Date of Birth/Sex: Dec 28, 1945 (71 y.o. Male) Treating RN: Huel Coventry Primary Care Hamdi Vari: Jaclyn Shaggy Other Clinician: Referring Mikinzie Maciejewski: Leonides Sake Treating Mia Milan/Extender: Rudene Re in Treatment: 0 Edema Assessment Assessed: [Left: No] [Right: No] Edema: [Left: No] [Right: No] Vascular Assessment Claudication: Claudication Assessment [Left:Rest Pain] [Right:Rest Pain] Pulses: Dorsalis Pedis Palpable: [Left:No] [Right:No] Doppler Audible: [Left:Inaudible] [Right:Inaudible] Posterior Tibial Palpable: [Left:No] [Right:No] Doppler Audible: [Left:Inaudible] [Right:Inaudible] Extremity colors, hair growth, and conditions: Extremity Color: [Left:Normal] [Right:Normal] Hair Growth on Extremity: [Left:Yes] [Right:Yes] Temperature of Extremity: [Left:Cool] [Right:Warm] Capillary Refill: [Left:> 3 seconds] [Right:> 3 seconds] Toe Nail Assessment Left: Right: Thick: No No Discolored: No No Deformed: No  No Improper Length and Hygiene: No No Notes 3rd right toenail missing; Unable to do ABI could not doppler pulses. Electronic Signature(s) Signed: 06/16/2017 5:39:55 PM By: Elliot Gurney, BSN, RN, CWS, Kim RN, BSN Entered By: Elliot Gurney, BSN, RN, CWS, Kim on 06/16/2017 10:07:38 [name] ([id number]) -------------------------------------------------------------------------------- Multi Wound Chart Details Patient Name: [name] Date of Service: 06/16/2017 9:30 AM Medical Record Number: [id number] Patient Account Number: [id number] Date of Birth/Sex: 02-07-1946 (71 y.o. Male) Treating RN: Huel Coventry Primary Care Renetta Suman: Jaclyn Shaggy Other Clinician: Referring Lunna Vogelgesang: Leonides Sake Treating Samya Siciliano/Extender: Rudene Re in Treatment: 0 Vital Signs Height(in): 70 Pulse(bpm): 94 Weight(lbs): 177 Blood Pressure(mmHg): 111/65 Body Mass Index(BMI): 25  Temperature(F): 98.2 Respiratory Rate 20 (breaths/min): Photos: [N/A:N/A] Wound Location: Right Toe Third N/A N/A Wounding Event: Trauma N/A N/A Primary Etiology: Arterial Insufficiency Ulcer N/A N/A Secondary Etiology: Trauma, Other N/A N/A Date Acquired: 03/16/2017 N/A N/A Weeks of Treatment: 0 N/A N/A Wound Status: Open N/A N/A Measurements L x W x D 1.2x1.5x0.1 N/A N/A (cm) Area (cm) : 1.414 N/A N/A Volume (cm) : 0.141 N/A N/A % Reduction in Area: 0.00% N/A N/A % Reduction in Volume: 0.00% N/A N/A Classification: Partial Thickness N/A N/A Exudate Amount: Medium N/A N/A Exudate Type: Serous N/A N/A Exudate Color: amber N/A N/A Wound Margin: Indistinct, nonvisible N/A N/A Granulation Amount: None Present (0%) N/A N/A Necrotic Amount: Large (67-100%) N/A N/A Exposed Structures: Fascia: No N/A N/A Fat Layer (Subcutaneous Tissue) Exposed: No Tendon: No Muscle: No Joint: No Bone: No Limited to Skin Breakdown Epithelialization: Small (1-33%) N/A N/A Periwound Skin Texture: No Abnormalities Noted N/A  N/A Periwound Skin Moisture: No Abnormalities Noted N/A N/A [name], [name] ([id number]) Periwound Skin Color: No Abnormalities Noted N/A N/A Tenderness on Palpation: No N/A N/A Wound Preparation: Ulcer Cleansing: N/A N/A Rinsed/Irrigated with Saline Topical Anesthetic Applied: Other: lidocaine 4% Assessment Notes: Patient's toenail is missing on N/A N/A 3rd toe. Treatment Notes Electronic Signature(s) Signed: 06/16/2017 12:03:27 PM By: Elliot Gurney, BSN, RN, CWS, Kim RN, BSN Previous Signature: 06/16/2017 10:58:27 AM Version By: Evlyn Kanner MD, FACS Entered By: Elliot Gurney BSN, RN, CWS, Kim on 06/16/2017 12:03:27 [name] ([id number]) -------------------------------------------------------------------------------- Multi-Disciplinary Care Plan Details Patient Name: [name] Date of Service: 06/16/2017 9:30 AM Medical Record Number: [id number] Patient Account Number: [id number] Date of Birth/Sex: 1945-11-26 (71 y.o. Male) Treating RN: Huel Coventry Primary Care Emonte Dieujuste: Jaclyn Shaggy Other Clinician: Referring Henchy Mccauley: Leonides Sake Treating Jorgen Wolfinger/Extender: Rudene Re in Treatment: 0 Active Inactive ` Abuse / Safety / Falls / Self Care Management Nursing Diagnoses: Impaired physical mobility Potential for falls Goals: Patient will not experience any injury related to falls Date Initiated: 06/16/2017 Target Resolution Date: 07/17/2017 Goal Status: Active Interventions: Assess Activities of Daily Living upon admission and as needed Assess fall risk on admission and as needed Notes: ` Nutrition Nursing Diagnoses: Impaired glucose control: actual or potential Goals: Patient/caregiver will maintain therapeutic glucose control Date Initiated: 06/16/2017 Target Resolution Date: 07/17/2017 Goal Status: Active Interventions: Assess patient nutrition upon admission and as needed per policy Provide education on elevated blood sugars and impact on wound  healing Notes: ` Orientation to the Wound Care Program Nursing Diagnoses: Knowledge deficit related to the wound healing center program Goals: Patient/caregiver will verbalize understanding of the Wound Healing Center Program Date Initiated: 06/16/2017 Target Resolution Date: 07/17/2017 [name], [name] ([id number]) Goal Status: Active Interventions: Provide education on orientation to the wound center Notes: ` Tissue Oxygenation Nursing Diagnoses: Actual ineffective tissue perfusion; peripheral (select once diagnosis is confirmed) Goals: Patient/caregiver will verbalize understanding of disease process and disease management Date Initiated: 06/16/2017 Target Resolution Date: 07/17/2017 Goal Status: Active Interventions: Assess patient understanding of disease process and management upon diagnosis and as needed Assess peripheral arterial status upon admission and as needed Treatment Activities: Ankle Brachial Index (ABI) : 06/16/2017 Notes: ` Wound/Skin Impairment Nursing Diagnoses: Impaired tissue integrity Knowledge deficit related to ulceration/compromised skin integrity Goals: Ulcer/skin breakdown will heal within 14 weeks Date Initiated: 06/16/2017 Target Resolution Date: 10/16/2017 Goal Status: Active Interventions: Assess patient/caregiver ability to obtain necessary supplies Assess patient/caregiver ability to perform ulcer/skin care regimen upon admission and as needed Treatment Activities: Topical wound management initiated : 06/16/2017 Notes:  Electronic Signature(s) Signed: 06/16/2017 12:02:09 PM By: Elliot GurneyWoody, BSN, RN, CWS, Kim RN, BSN Previous Signature: 06/16/2017 11:59:51 AM Version By: Elliot GurneyWoody, BSN, RN, CWS, Kim RN, BSN Entered By: Elliot GurneyWoody, BSN, RN, CWS, Kim on 06/16/2017 12:02:08 Devin RhymeSANGARE, [name] ([id number]) -------------------------------------------------------------------------------- Pain Assessment Details Patient Name: Devin RhymeSANGARE, [name] Date of Service:  06/16/2017 9:30 AM Medical Record Number: [id number] Patient Account Number: [id number] Date of Birth/Sex: 1945-11-10 (71 y.o. Male) Treating RN: Huel CoventryWoody, Kim Primary Care Christalyn Goertz: Jaclyn ShaggyAMAO, ENOBONG Other Clinician: Referring Chase Knebel: Leonides SakeHEN, BRIAN Treating Duyen Beckom/Extender: Rudene ReBritto, Errol Weeks in Treatment: 0 Active Problems Location of Pain Severity and Description of Pain Patient Has Paino Patient Unable to Respond Site Locations Pain Management and Medication Current Pain Management: Goals for Pain Management Topical or injectable lidocaine is offered to patient for acute pain when surgical debridement is performed. If needed, Patient is instructed to use over the counter pain medication for the following 24-48 hours after debridement. Wound care MDs do not prescribed pain medications. Patient has chronic pain or uncontrolled pain. Patient has been instructed to make an appointment with their Primary Care Physician for pain management. Electronic Signature(s) Signed: 06/16/2017 5:39:55 PM By: Elliot GurneyWoody, BSN, RN, CWS, Kim RN, BSN Entered By: Elliot GurneyWoody, BSN, RN, CWS, Kim on 06/16/2017 09:58:02 Devin RhymeSANGARE, [name] ([id number]) -------------------------------------------------------------------------------- Patient/Caregiver Education Details Patient Name: Devin RhymeSANGARE, [name] Date of Service: 06/16/2017 9:30 AM Medical Record Number: [id number] Patient Account Number: [id number] Date of Birth/Gender: 1945-11-10 (71 y.o. Male) Treating RN: Huel CoventryWoody, Kim Primary Care Physician: Jaclyn ShaggyAMAO, ENOBONG Other Clinician: Referring Physician: Leonides SakeHEN, BRIAN Treating Physician/Extender: Rudene ReBritto, Errol Weeks in Treatment: 0 Education Assessment Education Provided To: Caregiver Education Topics Provided Elevated Blood Sugar/ Impact on Healing: Handouts: Elevated Blood Sugars: How Do They Affect Wound Healing Methods: Explain/Verbal Responses: State content correctly Welcome To The Wound Care Center: Wound/Skin  Impairment: Handouts: Caring for Your Ulcer, Other: wound care as prescribed Methods: Demonstration, Explain/Verbal Responses: State content correctly Electronic Signature(s) Signed: 06/16/2017 5:39:55 PM By: Elliot GurneyWoody, BSN, RN, CWS, Kim RN, BSN Entered By: Elliot GurneyWoody, BSN, RN, CWS, Kim on 06/16/2017 12:51:59 Devin RhymeSANGARE, [name] ([id number]) -------------------------------------------------------------------------------- Wound Assessment Details Patient Name: Devin RhymeSANGARE, [name] Date of Service: 06/16/2017 9:30 AM Medical Record Number: [id number] Patient Account Number: [id number] Date of Birth/Sex: 1945-11-10 (71 y.o. Male) Treating RN: Huel CoventryWoody, Kim Primary Care Hartlee Amedee: Jaclyn ShaggyAMAO, ENOBONG Other Clinician: Referring Aislyn Hayse: Leonides SakeHEN, BRIAN Treating Jaelan Rasheed/Extender: Rudene ReBritto, Errol Weeks in Treatment: 0 Wound Status Wound Number: 1 Primary Etiology: Arterial Insufficiency Ulcer Wound Location: Right Toe Third Secondary Etiology: Trauma, Other Wounding Event: Trauma Wound Status: Open Date Acquired: 03/16/2017 Weeks Of Treatment: 0 Clustered Wound: No Photos Wound Measurements Length: (cm) 1.2 Width: (cm) 1.5 Depth: (cm) 0.1 Area: (cm) 1.414 Volume: (cm) 0.141 % Reduction in Area: 0% % Reduction in Volume: 0% Epithelialization: Small (1-33%) Tunneling: No Undermining: No Wound Description Classification: Partial Thickness Foul O Wound Margin: Indistinct, nonvisible Slough Exudate Amount: Medium Exudate Type: Serous Exudate Color: amber dor After Cleansing: No /Fibrino No Wound Bed Granulation Amount: None Present (0%) Exposed Structure Necrotic Amount: Large (67-100%) Fascia Exposed: No Necrotic Quality: Adherent Slough Fat Layer (Subcutaneous Tissue) Exposed: No Tendon Exposed: No Muscle Exposed: No Joint Exposed: No Bone Exposed: No Limited to Skin Breakdown Periwound Skin Texture Texture Color No Abnormalities Noted: No No Abnormalities Noted: No Moisture [name], [name]  ([id number]) No Abnormalities Noted: No Wound Preparation Ulcer Cleansing: Rinsed/Irrigated with Saline Topical Anesthetic Applied: Other: lidocaine 4%, Assessment Notes Patient's toenail is missing on 3rd toe. Treatment Notes Wound #1 (Right Toe Third) 1. Cleansed with: Clean wound  with Normal Saline 2. Anesthetic Topical Lidocaine 4% cream to wound bed prior to debridement 4. Dressing Applied: Other dressing (specify in notes) 5. Secondary Dressing Applied ABD and Kerlix/Conform 7. Secured with Tape Notes Silvercell rope between toes Electronic Signature(s) Signed: 06/16/2017 5:39:55 PM By: Elliot Gurney, BSN, RN, CWS, Kim RN, BSN Entered By: Elliot Gurney, BSN, RN, CWS, Kim on 06/16/2017 10:04:58 [name] ([id number]) -------------------------------------------------------------------------------- Vitals Details Patient Name: [name] Date of Service: 06/16/2017 9:30 AM Medical Record Number: [id number] Patient Account Number: [id number] Date of Birth/Sex: 05-11-1946 (71 y.o. Male) Treating RN: Huel Coventry Primary Care Malakhi Markwood: Jaclyn Shaggy Other Clinician: Referring Dekari Bures: Leonides Sake Treating Breiona Couvillon/Extender: Rudene Re in Treatment: 0 Vital Signs Time Taken: 09:54 Temperature (F): 98.2 Height (in): 70 Pulse (bpm): 94 Source: Stated Respiratory Rate (breaths/min): 20 Weight (lbs): 177 Blood Pressure (mmHg): 111/65 Source: Stated Reference Range: 80 - 120 mg / dl Body Mass Index (BMI): 25.4 Electronic Signature(s) Signed: 06/16/2017 5:39:55 PM By: Elliot Gurney, BSN, RN, CWS, Kim RN, BSN Entered By: Elliot Gurney, BSN, RN, CWS, Kim on 06/16/2017 09:58:08

## 2017-06-18 ENCOUNTER — Telehealth: Payer: Self-pay

## 2017-06-18 NOTE — Addendum Note (Signed)
Addended by: Burton ApleyPETTY, Markel Mergenthaler A on: 06/18/2017 01:41 PM   Modules accepted: Orders

## 2017-06-18 NOTE — Telephone Encounter (Signed)
[facility] 1 X[facility] X 3wks, called Natalia LeatherwoodKatherine back and approved verbal orders.

## 2017-06-19 ENCOUNTER — Encounter: Payer: Self-pay | Attending: Physical Medicine & Rehabilitation | Admitting: Physical Medicine & Rehabilitation

## 2017-06-19 ENCOUNTER — Encounter: Payer: Self-pay | Admitting: Physical Medicine & Rehabilitation

## 2017-06-19 VITALS — BP 97/64 | HR 89

## 2017-06-19 DIAGNOSIS — R269 Unspecified abnormalities of gait and mobility: Secondary | ICD-10-CM | POA: Insufficient documentation

## 2017-06-19 DIAGNOSIS — I5020 Unspecified systolic (congestive) heart failure: Secondary | ICD-10-CM | POA: Insufficient documentation

## 2017-06-19 DIAGNOSIS — L97511 Non-pressure chronic ulcer of other part of right foot limited to breakdown of skin: Secondary | ICD-10-CM

## 2017-06-19 DIAGNOSIS — E1142 Type 2 diabetes mellitus with diabetic polyneuropathy: Secondary | ICD-10-CM

## 2017-06-19 DIAGNOSIS — I63411 Cerebral infarction due to embolism of right middle cerebral artery: Secondary | ICD-10-CM

## 2017-06-19 DIAGNOSIS — Z86711 Personal history of pulmonary embolism: Secondary | ICD-10-CM | POA: Insufficient documentation

## 2017-06-19 DIAGNOSIS — Z8249 Family history of ischemic heart disease and other diseases of the circulatory system: Secondary | ICD-10-CM | POA: Insufficient documentation

## 2017-06-19 DIAGNOSIS — M792 Neuralgia and neuritis, unspecified: Secondary | ICD-10-CM

## 2017-06-19 DIAGNOSIS — I959 Hypotension, unspecified: Secondary | ICD-10-CM | POA: Insufficient documentation

## 2017-06-19 DIAGNOSIS — I63511 Cerebral infarction due to unspecified occlusion or stenosis of right middle cerebral artery: Secondary | ICD-10-CM

## 2017-06-19 DIAGNOSIS — Z951 Presence of aortocoronary bypass graft: Secondary | ICD-10-CM

## 2017-06-19 DIAGNOSIS — Z9889 Other specified postprocedural states: Secondary | ICD-10-CM | POA: Insufficient documentation

## 2017-06-19 DIAGNOSIS — Z7901 Long term (current) use of anticoagulants: Secondary | ICD-10-CM

## 2017-06-19 DIAGNOSIS — Z8673 Personal history of transient ischemic attack (TIA), and cerebral infarction without residual deficits: Secondary | ICD-10-CM | POA: Insufficient documentation

## 2017-06-19 DIAGNOSIS — N179 Acute kidney failure, unspecified: Secondary | ICD-10-CM | POA: Insufficient documentation

## 2017-06-19 DIAGNOSIS — I110 Hypertensive heart disease with heart failure: Secondary | ICD-10-CM | POA: Insufficient documentation

## 2017-06-19 DIAGNOSIS — E871 Hypo-osmolality and hyponatremia: Secondary | ICD-10-CM

## 2017-06-19 DIAGNOSIS — E785 Hyperlipidemia, unspecified: Secondary | ICD-10-CM | POA: Insufficient documentation

## 2017-06-19 NOTE — Progress Notes (Signed)
Subjective:    Patient ID: [name], male    DOB: 02/27/1946, 71 y.o.   MRN: [id number]  HPI 71 year old right-handed, limited English-speaking male with history of coronary artery disease with CABG in 2007 in [place], diabetes mellitus, systolic congestive heart failure, hypertension, and CVA, as well as questionable medical compliance presents for follow up for right insular infarct.    Last clinic visit 05/22/17.  Daughter presents, who provides history. Since last visit, she states they seen Neurology soon.  He is getting his INR checked.  He complains of toe pain.  He is following up with wound care, for right toe wound, which is slow to heal.  He sees Cards next month.  His CBGs are overall controlled. His BP remains low.  His Na+ remains low on last check per PCP.  Continues to use walker/wheelchair for safety.  Denies falls.    Pain Inventory Average Pain 7 Pain Right Now 5 My pain is burning and stabbing  In the last 24 hours, has pain interfered with the following? General activity 2 Relation with others 2 Enjoyment of life 1 What TIME of day is your pain at its worst? evening Sleep (in general) Poor  Pain is worse with: some activites Pain improves with: medication Relief from Meds: no selection  Mobility walk without assistance walk with assistance use a cane use a walker ability to climb steps?  yes do you drive?  no  Function not employed: date last employed .  Neuro/Psych confusion  Prior Studies Any changes since last visit?  no  Physicians involved in your care Any changes since last visit?  no   Family History  Problem Relation Age of Onset  . Hypertension Mother   . Hypertension Sister    Social History   Social History  . Marital status: Married    Spouse name: N/A  . Number of children: N/A  . Years of education: N/A   Social History Main Topics  . Smoking status: Never Smoker  . Smokeless tobacco: Never Used  . Alcohol use No    . Drug use: No  . Sexual activity: Not Asked   Other Topics Concern  . None   Social History Narrative   Pt is from [place] and speaks [place]. He came to the [place] in 02/2017.   He has a daughter and son-in-law that speak [place].    Past Surgical History:  Procedure Laterality Date  . CARDIAC SURGERY    . CYSTOSCOPY N/A 04/25/2017   Procedure: CYSTOSCOPY FLEXIBLE;  Surgeon: Fransisco Hertz, MD;  Location: [facility] OR;  Service: Vascular;  Laterality: N/A;  . EMBOLECTOMY Right 04/25/2017   Procedure: EMBOLECTOMY RIGHT FEMORAL ARTERY;  Surgeon: Fransisco Hertz, MD;  Location: [facility] OR;  Service: Vascular;  Laterality: Right;  . IR PERCUTANEOUS ART THROMBECTOMY/INFUSION INTRACRANIAL INC DIAG ANGIO  04/25/2017  . PATCH ANGIOPLASTY Right 04/25/2017   Procedure: PATCH ANGIOPLASTY USING Livia Snellen BIOLOGIC PATCH;  Surgeon: Fransisco Hertz, MD;  Location: [facility] OR;  Service: Vascular;  Laterality: Right;  . PROSTATE SURGERY  2002  . RADIOLOGY WITH ANESTHESIA N/A 04/24/2017   Procedure: RADIOLOGY WITH ANESTHESIA Code Stroke;  Surgeon: Gilmer Mor, DO;  Location: [facility] OR;  Service: Anesthesiology;  Laterality: N/A;   Past Medical History:  Diagnosis Date  . CAD (coronary artery disease) 2007   CABG 2007 in [place]  . Chronic combined systolic and diastolic heart failure ([name])    a. EF 20-25% by echo in 03/2017 with  NST showing prior infarct but no ischemia.   . Diabetes mellitus without complication ([name])   . Hyperlipidemia   . Hypertension   . Pulmonary embolism ([name])   . Stroke ([name])    BP 97/64   Pulse 89   SpO2 96%   Opioid Risk Score:   Fall Risk Score:  `1  Depression screen PHQ 2/9  No flowsheet data found.  Review of Systems  Constitutional: Positive for unexpected weight change.  HENT: Negative.   Eyes: Negative.   Respiratory: Negative.   Cardiovascular: Negative.   Gastrointestinal: Positive for constipation and vomiting.  Endocrine: Negative.   Genitourinary: Negative.   Musculoskeletal:  Positive for arthralgias, gait problem and myalgias.  Skin: Negative.   Allergic/Immunologic: Negative.   Hematological: Negative.   Psychiatric/Behavioral: Negative.   All other systems reviewed and are negative.      Objective:   Physical Exam Constitutional: He appears well-developed and well-nourished.  HENT: Normocephalic and atraumatic.  Eyes: EOM are normal. No discharge.  Cardiovascular: RRR. No JVD. Respiratory: CTA Bilaterally. Normal effort    GI: Bowel sounds are normal. He exhibits no distension.  Musculoskeletal: He exhibits tenderness. He exhibits no edema.  Neurological: He is alert.  Patient makes eye contact with examiner.  Limited English-speaking.  Follows simple commands.  RUE 5/5 proximal to distal LUE 5/5 proximal to distal.  RLE: 4+/5 proximal to distal LLE: 4+/5 prox to distal.   Skin: Skin is warm and dry.  Psychiatric: Pleasant and cooperative     Assessment & Plan:  71 year old right-handed, limited English-speaking male with history of coronary artery disease with CABG in 2007 in [place], diabetes mellitus, systolic congestive heart failure, hypertension, and CVA, as well as questionable medical compliance presents for follow for right insular infarct.    1.  Left side weakness secondary to right insular infarction after right lower extremity thromboembolectomy/endarterectomy for ischemic occlusion  Cont therapies  Follow up Neurology  Cont INR checks with adjustment, recent level subtherapeutic, being adjusted  2. CAD / CABG 2007 in [place].   Follow up Cardiology, appointment next month  3. Diabetes mellitus with peripheral neuropathy.   Hemoglobin A1c 8.8  Cont meds  CBGs appear controlled at present  4. Systolic congestive heart failure.   Follow up Cards  Cont meds  5. Hypotension  Remains low  Meds reviewed with daughter, Cozaar 1/2 tab per PCP, but pt taking 1 tab  Follow up with Cards  6. Hyponatremia  Remains low and  ?trending down per last PCP draw  Will refer to Nephro  7. Gait abnormality  Cont cane/walker/wheelchair for safety  Cont therapies  8. Right toe ulcer  Cont follow up wound care

## 2017-06-20 ENCOUNTER — Telehealth: Payer: Self-pay | Admitting: Family Medicine

## 2017-06-20 ENCOUNTER — Ambulatory Visit (INDEPENDENT_AMBULATORY_CARE_PROVIDER_SITE_OTHER): Payer: Self-pay | Admitting: Pharmacist Clinician (PhC)/ Clinical Pharmacy Specialist

## 2017-06-20 DIAGNOSIS — Z7901 Long term (current) use of anticoagulants: Secondary | ICD-10-CM

## 2017-06-20 DIAGNOSIS — I63411 Cerebral infarction due to embolism of right middle cerebral artery: Secondary | ICD-10-CM

## 2017-06-20 LAB — POCT INR: INR: 2.9

## 2017-06-20 NOTE — Telephone Encounter (Signed)
Donita from [facility] called wanting to know if Tramadol could be given to pt more frequently. Pt is currently taking every 12 hours but its not holding the pain and would like to start taking every 8 hours if needed.

## 2017-06-23 ENCOUNTER — Encounter: Payer: Self-pay | Attending: Surgery | Admitting: Surgery

## 2017-06-23 ENCOUNTER — Telehealth: Payer: Self-pay

## 2017-06-23 DIAGNOSIS — I739 Peripheral vascular disease, unspecified: Secondary | ICD-10-CM | POA: Insufficient documentation

## 2017-06-23 DIAGNOSIS — Z951 Presence of aortocoronary bypass graft: Secondary | ICD-10-CM | POA: Insufficient documentation

## 2017-06-23 DIAGNOSIS — L97512 Non-pressure chronic ulcer of other part of right foot with fat layer exposed: Secondary | ICD-10-CM | POA: Insufficient documentation

## 2017-06-23 DIAGNOSIS — Z794 Long term (current) use of insulin: Secondary | ICD-10-CM | POA: Insufficient documentation

## 2017-06-23 DIAGNOSIS — I2510 Atherosclerotic heart disease of native coronary artery without angina pectoris: Secondary | ICD-10-CM | POA: Insufficient documentation

## 2017-06-23 DIAGNOSIS — Z86711 Personal history of pulmonary embolism: Secondary | ICD-10-CM | POA: Insufficient documentation

## 2017-06-23 DIAGNOSIS — E1140 Type 2 diabetes mellitus with diabetic neuropathy, unspecified: Secondary | ICD-10-CM | POA: Insufficient documentation

## 2017-06-23 DIAGNOSIS — I110 Hypertensive heart disease with heart failure: Secondary | ICD-10-CM | POA: Insufficient documentation

## 2017-06-23 DIAGNOSIS — E11621 Type 2 diabetes mellitus with foot ulcer: Secondary | ICD-10-CM | POA: Insufficient documentation

## 2017-06-23 DIAGNOSIS — Z8673 Personal history of transient ischemic attack (TIA), and cerebral infarction without residual deficits: Secondary | ICD-10-CM | POA: Insufficient documentation

## 2017-06-23 DIAGNOSIS — I509 Heart failure, unspecified: Secondary | ICD-10-CM | POA: Insufficient documentation

## 2017-06-23 DIAGNOSIS — E785 Hyperlipidemia, unspecified: Secondary | ICD-10-CM | POA: Insufficient documentation

## 2017-06-23 NOTE — Telephone Encounter (Signed)
Will route to PCP 

## 2017-06-23 NOTE — Telephone Encounter (Signed)
[facility] for 1 time for 3 weeks.

## 2017-06-23 NOTE — Telephone Encounter (Signed)
Notified. 

## 2017-06-23 NOTE — Telephone Encounter (Signed)
We may place the order.  Thanks.

## 2017-06-24 ENCOUNTER — Telehealth: Payer: Self-pay | Admitting: *Deleted

## 2017-06-24 NOTE — Progress Notes (Signed)
[name], [name] ([id number]) Visit Report for 06/23/2017 Arrival Information Details Patient Name: [name], [name] Date of Service: 06/23/2017 10:15 AM Medical Record Number: [id number] Patient Account Number: [id number] Date of Birth/Sex: 1946/04/26 (71 y.o. Male) Treating RN: Ashok CordiaPinkerton, Debi Primary Care Ceria Suminski: Jaclyn ShaggyAMAO, ENOBONG Other Clinician: Referring Sheyli Horwitz: Jaclyn ShaggyAMAO, ENOBONG Treating Alizia Greif/Extender: Rudene ReBritto, Errol Weeks in Treatment: 1 Visit Information History Since Last Visit All ordered tests and consults were completed: No Patient Arrived: Wheel Chair Added or deleted any medications: No Arrival Time: 10:52 Any new allergies or adverse reactions: No Accompanied By: daughter Had a fall or experienced change in No Transfer Assistance: EasyPivot Patient activities of daily living that may affect Lift risk of falls: Patient Identification Verified: Yes Signs or symptoms of abuse/neglect since last visito No Secondary Verification Process Yes Hospitalized since last visit: No Completed: Has Dressing in Place as Prescribed: Yes Patient Requires Transmission-Based No Precautions: Pain Present Now: Yes Patient Has Alerts: Yes Patient Alerts: Patient on Blood Thinner Translator Required TYPE II DIABETIC ABI: inaudible pulse Electronic Signature(s) Signed: 06/23/2017 4:55:44 PM By: Alejandro MullingPinkerton, Debra Entered By: Alejandro MullingPinkerton, Debra on 06/23/2017 10:55:25 [name], [name] ([id number]) -------------------------------------------------------------------------------- Clinic Level of Care Assessment Details Patient Name: [name], [name] Date of Service: 06/23/2017 10:15 AM Medical Record Number: [id number] Patient Account Number: [id number] Date of Birth/Sex: 1946/04/26 (71 y.o. Male) Treating RN: Ashok CordiaPinkerton, Debi Primary Care Deaysia Grigoryan: Jaclyn ShaggyAMAO, ENOBONG Other Clinician: Referring Talmage Teaster: Jaclyn ShaggyAMAO, ENOBONG Treating Tywaun Hiltner/Extender: Rudene ReBritto, Errol Weeks in Treatment: 1 Clinic Level of Care  Assessment Items TOOL 4 Quantity Score X - Use when only an EandM is performed on FOLLOW-UP visit 1 0 ASSESSMENTS - Nursing Assessment / Reassessment X - Reassessment of Co-morbidities (includes updates in patient status) 1 10 X- 1 5 Reassessment of Adherence to Treatment Plan ASSESSMENTS - Wound and Skin Assessment / Reassessment X - Simple Wound Assessment / Reassessment - one wound 1 5 []  - 0 Complex Wound Assessment / Reassessment - multiple wounds []  - 0 Dermatologic / Skin Assessment (not related to wound area) ASSESSMENTS - Focused Assessment []  - Circumferential Edema Measurements - multi extremities 0 []  - 0 Nutritional Assessment / Counseling / Intervention []  - 0 Lower Extremity Assessment (monofilament, tuning fork, pulses) []  - 0 Peripheral Arterial Disease Assessment (using hand held doppler) ASSESSMENTS - Ostomy and/or Continence Assessment and Care []  - Incontinence Assessment and Management 0 []  - 0 Ostomy Care Assessment and Management (repouching, etc.) PROCESS - Coordination of Care X - Simple Patient / Family Education for ongoing care 1 15 []  - 0 Complex (extensive) Patient / Family Education for ongoing care []  - 0 Staff obtains ChiropractorConsents, Records, Test Results / Process Orders []  - 0 Staff telephones HHA, Nursing Homes / Clarify orders / etc []  - 0 Routine Transfer to another Facility (non-emergent condition) []  - 0 Routine Hospital Admission (non-emergent condition) []  - 0 New Admissions / Manufacturing engineernsurance Authorizations / Ordering NPWT, Apligraf, etc. []  - 0 Emergency Hospital Admission (emergent condition) X- 1 10 Simple Discharge Coordination [place], [name] ([id number]) []  - 0 Complex (extensive) Discharge Coordination PROCESS - Special Needs []  - Pediatric / Minor Patient Management 0 []  - 0 Isolation Patient Management []  - 0 Hearing / Language / Visual special needs []  - 0 Assessment of Community assistance (transportation, D/C planning,  etc.) []  - 0 Additional assistance / Altered mentation []  - 0 Support Surface(s) Assessment (bed, cushion, seat, etc.) INTERVENTIONS - Wound Cleansing / Measurement X - Simple Wound Cleansing - one wound 1 5 []  - 0 Complex Wound  Cleansing - multiple wounds X- 1 5 Wound Imaging (photographs - any number of wounds) []  - 0 Wound Tracing (instead of photographs) X- 1 5 Simple Wound Measurement - one wound []  - 0 Complex Wound Measurement - multiple wounds INTERVENTIONS - Wound Dressings X - Small Wound Dressing one or multiple wounds 1 10 []  - 0 Medium Wound Dressing one or multiple wounds []  - 0 Large Wound Dressing one or multiple wounds X- 1 5 Application of Medications - topical []  - 0 Application of Medications - injection INTERVENTIONS - Miscellaneous []  - External ear exam 0 []  - 0 Specimen Collection (cultures, biopsies, blood, body fluids, etc.) []  - 0 Specimen(s) / Culture(s) sent or taken to Lab for analysis []  - 0 Patient Transfer (multiple staff / Nurse, adult / Similar devices) []  - 0 Simple Staple / Suture removal (25 or less) []  - 0 Complex Staple / Suture removal (26 or more) []  - 0 Hypo / Hyperglycemic Management (close monitor of Blood Glucose) []  - 0 Ankle / Brachial Index (ABI) - do not check if billed separately X- 1 5 Vital Signs [place], [name] ([id number]) Has the patient been seen at the hospital within the last three years: Yes Total Score: 80 Level Of Care: New/Established - Level 3 Electronic Signature(s) Signed: 06/23/2017 4:55:44 PM By: Alejandro Mulling Entered By: Alejandro Mulling on 06/23/2017 11:38:46 Devin Rhyme ([id number]) -------------------------------------------------------------------------------- Encounter Discharge Information Details Patient Name: Devin Rhyme Date of Service: 06/23/2017 10:15 AM Medical Record Number: [id number] Patient Account Number: [id number] Date of Birth/Sex: 05/14/1946 (71 y.o. Male) Treating RN:  Huel Coventry Primary Care Marinell Igarashi: Jaclyn Shaggy Other Clinician: Referring Turrell Severt: Jaclyn Shaggy Treating Curtistine Pettitt/Extender: Rudene Re in Treatment: 1 Encounter Discharge Information Items Discharge Pain Level: 0 Discharge Condition: Stable Ambulatory Status: Wheelchair Discharge Destination: Home Transportation: Private Auto Accompanied By: daughter Schedule Follow-up Appointment: Yes Medication Reconciliation completed and No provided to Patient/Care Akyla Vavrek: Provided on Clinical Summary of Care: 06/23/2017 Form Type Recipient Paper Patient IS Electronic Signature(s) Signed: 06/23/2017 11:36:37 AM By: Alejandro Mulling Entered By: Alejandro Mulling on 06/23/2017 11:36:37 Devin Rhyme ([id number]) -------------------------------------------------------------------------------- Lower Extremity Assessment Details Patient Name: Devin Rhyme Date of Service: 06/23/2017 10:15 AM Medical Record Number: [id number] Patient Account Number: [id number] Date of Birth/Sex: Oct 09, 1945 (71 y.o. Male) Treating RN: Ashok Cordia, Debi Primary Care Nizhoni Parlow: Jaclyn Shaggy Other Clinician: Referring Drucilla Cumber: Jaclyn Shaggy Treating Connee Ikner/Extender: Rudene Re in Treatment: 1 Vascular Assessment Pulses: Dorsalis Pedis Palpable: [Right:No] Doppler Audible: [Right:Inaudible] Posterior Tibial Extremity colors, hair growth, and conditions: Extremity Color: [Right:Normal] Temperature of Extremity: [Right:Warm] Capillary Refill: [Right:> 3 seconds] Toe Nail Assessment Left: Right: Thick: No Discolored: No Deformed: No Improper Length and Hygiene: No Electronic Signature(s) Signed: 06/23/2017 4:55:44 PM By: Alejandro Mulling Entered By: Alejandro Mulling on 06/23/2017 11:00:34 [place], Emmie Niemann ([id number]) -------------------------------------------------------------------------------- Multi Wound Chart Details Patient Name: Devin Rhyme Date of Service: 06/23/2017 10:15  AM Medical Record Number: [id number] Patient Account Number: [id number] Date of Birth/Sex: Feb 20, 1946 (72 y.o. Male) Treating RN: Ashok Cordia, Debi Primary Care Ashanti Littles: Jaclyn Shaggy Other Clinician: Referring Sinclair Arrazola: Jaclyn Shaggy Treating Waino Mounsey/Extender: Rudene Re in Treatment: 1 Vital Signs Height(in): 70 Pulse(bpm): 97 Weight(lbs): 177 Blood Pressure(mmHg): 119/61 Body Mass Index(BMI): 25 Temperature(F): 98.2 Respiratory Rate 20 (breaths/min): Photos: [1:No Photos] [N/A:N/A] Wound Location: [1:Right Toe Third] [N/A:N/A] Wounding Event: [1:Trauma] [N/A:N/A] Primary Etiology: [1:Arterial Insufficiency Ulcer] [N/A:N/A] Secondary Etiology: [1:Trauma, Other] [N/A:N/A] Comorbid History: [1:Congestive Heart Failure, Coronary Artery Disease, Deep Vein Thrombosis, Hypertension, Peripheral Arterial Disease, Type II Diabetes, Neuropathy] [N/A:N/A] Date Acquired: [  1:03/16/2017] [N/A:N/A] Weeks of Treatment: [1:1] [N/A:N/A] Wound Status: [1:Open] [N/A:N/A] Measurements L x W x D [1:0.5x0.5x0.1] [N/A:N/A] (cm) Area (cm) : [1:0.196] [N/A:N/A] Volume (cm) : [1:0.02] [N/A:N/A] % Reduction in Area: [1:86.10%] [N/A:N/A] % Reduction in Volume: [1:85.80%] [N/A:N/A] Classification: [1:Partial Thickness] [N/A:N/A] Exudate Amount: [1:Medium] [N/A:N/A] Exudate Type: [1:Serous] [N/A:N/A] Exudate Color: [1:amber] [N/A:N/A] Wound Margin: [1:Indistinct, nonvisible] [N/A:N/A] Granulation Amount: [1:None Present (0%)] [N/A:N/A] Necrotic Amount: [1:Large (67-100%)] [N/A:N/A] Necrotic Tissue: [1:Eschar, Adherent Slough] [N/A:N/A] Exposed Structures: [1:Fascia: No Fat Layer (Subcutaneous Tissue) Exposed: No Tendon: No Muscle: No Joint: No Bone: No Limited to Skin Breakdown] [N/A:N/A] Epithelialization: [1:Small (1-33%)] [N/A:N/A] Periwound Skin Texture: No Abnormalities Noted N/A N/A Periwound Skin Moisture: No Abnormalities Noted N/A N/A Periwound Skin Color: No Abnormalities Noted  N/A N/A Tenderness on Palpation: No N/A N/A Wound Preparation: Ulcer Cleansing: N/A N/A Rinsed/Irrigated with Saline Topical Anesthetic Applied: Other: lidocaine 4% Treatment Notes Electronic Signature(s) Signed: 06/23/2017 11:16:26 AM By: Evlyn Kanner MD, FACS Previous Signature: 06/23/2017 11:03:39 AM Version By: Alejandro Mulling Entered By: Evlyn Kanner on 06/23/2017 11:16:26 Devin Rhyme ([id number]) -------------------------------------------------------------------------------- Multi-Disciplinary Care Plan Details Patient Name: Devin Rhyme Date of Service: 06/23/2017 10:15 AM Medical Record Number: [id number] Patient Account Number: [id number] Date of Birth/Sex: [birth year] (71 y.o. Male) Treating RN: Ashok Cordia, Debi Primary Care Emillie Chasen: Jaclyn Shaggy Other Clinician: Referring Theona Muhs: Jaclyn Shaggy Treating Emelee Rodocker/Extender: Rudene Re in Treatment: 1 Active Inactive ` Abuse / Safety / Falls / Self Care Management Nursing Diagnoses: Impaired physical mobility Potential for falls Goals: Patient will not experience any injury related to falls Date Initiated: 06/16/2017 Target Resolution Date: 07/17/2017 Goal Status: Active Interventions: Assess Activities of Daily Living upon admission and as needed Assess fall risk on admission and as needed Notes: ` Nutrition Nursing Diagnoses: Impaired glucose control: actual or potential Goals: Patient/caregiver will maintain therapeutic glucose control Date Initiated: 06/16/2017 Target Resolution Date: 07/17/2017 Goal Status: Active Interventions: Assess patient nutrition upon admission and as needed per policy Provide education on elevated blood sugars and impact on wound healing Notes: ` Orientation to the Wound Care Program Nursing Diagnoses: Knowledge deficit related to the wound healing center program Goals: Patient/caregiver will verbalize understanding of the Wound Healing Center Program Date  Initiated: 06/16/2017 Target Resolution Date: 07/17/2017 CLEARENCE, VITUG ([id number]) Goal Status: Active Interventions: Provide education on orientation to the wound center Notes: ` Tissue Oxygenation Nursing Diagnoses: Actual ineffective tissue perfusion; peripheral (select once diagnosis is confirmed) Goals: Patient/caregiver will verbalize understanding of disease process and disease management Date Initiated: 06/16/2017 Target Resolution Date: 07/17/2017 Goal Status: Active Interventions: Assess patient understanding of disease process and management upon diagnosis and as needed Assess peripheral arterial status upon admission and as needed Treatment Activities: Ankle Brachial Index (ABI) : 06/16/2017 Notes: ` Wound/Skin Impairment Nursing Diagnoses: Impaired tissue integrity Knowledge deficit related to ulceration/compromised skin integrity Goals: Ulcer/skin breakdown will heal within 14 weeks Date Initiated: 06/16/2017 Target Resolution Date: 10/16/2017 Goal Status: Active Interventions: Assess patient/caregiver ability to obtain necessary supplies Assess patient/caregiver ability to perform ulcer/skin care regimen upon admission and as needed Treatment Activities: Topical wound management initiated : 06/16/2017 Notes: Electronic Signature(s) Signed: 06/23/2017 11:03:26 AM By: Alejandro Mulling Entered By: Alejandro Mulling on 06/23/2017 11:03:24 Devin Rhyme ([id number]) -------------------------------------------------------------------------------- Pain Assessment Details Patient Name: Devin Rhyme Date of Service: 06/23/2017 10:15 AM Medical Record Number: [id number] Patient Account Number: [id number] Date of Birth/Sex: 1946-06-05 (71 y.o. Male) Treating RN: Ashok Cordia, Debi Primary Care Haleigh Desmith: Jaclyn Shaggy Other Clinician: Referring Amaliya Whitelaw: Jaclyn Shaggy Treating Ines Rebel/Extender: Rudene Re  in Treatment: 1 Active Problems Location of Pain  Severity and Description of Pain Patient Has Paino Yes Site Locations Rate the pain. Current Pain Level: 2 Character of Pain Describe the Pain: Aching Pain Management and Medication Current Pain Management: Electronic Signature(s) Signed: 06/23/2017 4:55:44 PM By: Alejandro MullingPinkerton, Debra Entered By: Alejandro MullingPinkerton, Debra on 06/23/2017 10:55:45 [name], [name] ([id number]) -------------------------------------------------------------------------------- Patient/Caregiver Education Details Patient Name: [name], Cai Date of Service: 06/23/2017 10:15 AM Medical Record Number: [id number] Patient Account Number: [id number] Date of Birth/Gender: 09/18/1945 ([age] y.o. Male) Treating RN: Ashok CordiaPinkerton, Debi Primary Care Physician: Jaclyn ShaggyAMAO, ENOBONG Other Clinician: Referring Physician: Jaclyn ShaggyAMAO, ENOBONG Treating Physician/Extender: Rudene ReBritto, Errol Weeks in Treatment: 1 Education Assessment Education Provided To: Patient Education Topics Provided Wound/Skin Impairment: Handouts: Other: change dressing as ordered Methods: Demonstration, Explain/Verbal Responses: State content correctly Electronic Signature(s) Signed: 06/23/2017 4:55:44 PM By: Alejandro MullingPinkerton, Debra Entered By: Alejandro MullingPinkerton, Debra on 06/23/2017 11:36:55 [place], [place] ([id number]) -------------------------------------------------------------------------------- Wound Assessment Details Patient Name: [name], Earmon Date of Service: 06/23/2017 10:15 AM Medical Record Number: [id number] Patient Account Number: [id number] Date of Birth/Sex: 09/18/1945 (71 y.o. Male) Treating RN: Ashok CordiaPinkerton, Debi Primary Care Kaliya Shreiner: Jaclyn ShaggyAMAO, ENOBONG Other Clinician: Referring Ozzie Knobel: Jaclyn ShaggyAMAO, ENOBONG Treating Jeraline Marcinek/Extender: Rudene ReBritto, Errol Weeks in Treatment: 1 Wound Status Wound Number: 1 Primary Arterial Insufficiency Ulcer Etiology: Wound Location: Right Toe Third Secondary Trauma, Other Wounding Event: Trauma Etiology: Date Acquired: 03/16/2017 Wound Open Weeks Of  Treatment: 1 Status: Clustered Wound: No Comorbid Congestive Heart Failure, Coronary Artery History: Disease, Deep Vein Thrombosis, Hypertension, Peripheral Arterial Disease, Type II Diabetes, Neuropathy Photos Photo Uploaded By: Alejandro MullingPinkerton, Debra on 06/23/2017 16:12:50 Wound Measurements Length: (cm) 0.5 Width: (cm) 0.5 Depth: (cm) 0.1 Area: (cm) 0.196 Volume: (cm) 0.02 % Reduction in Area: 86.1% % Reduction in Volume: 85.8% Epithelialization: Small (1-33%) Tunneling: No Undermining: No Wound Description Classification: Partial Thickness Wound Margin: Indistinct, nonvisible Exudate Amount: Medium Exudate Type: Serous Exudate Color: amber Foul Odor After Cleansing: No Slough/Fibrino Yes Wound Bed Granulation Amount: None Present (0%) Exposed Structure Necrotic Amount: Large (67-100%) Fascia Exposed: No Necrotic Quality: Eschar, Adherent Slough Fat Layer (Subcutaneous Tissue) Exposed: No Tendon Exposed: No Muscle Exposed: No Joint Exposed: No Bassett, Shlome ([id number]) Bone Exposed: No Limited to Skin Breakdown Periwound Skin Texture Texture Color No Abnormalities Noted: No No Abnormalities Noted: No Moisture No Abnormalities Noted: No Wound Preparation Ulcer Cleansing: Rinsed/Irrigated with Saline Topical Anesthetic Applied: Other: lidocaine 4%, Treatment Notes Wound #1 (Right Toe Third) 1. Cleansed with: Clean wound with Normal Saline 2. Anesthetic Topical Lidocaine 4% cream to wound bed prior to debridement 4. Dressing Applied: Other dressing (specify in notes) 5. Secondary Dressing Applied Kerlix/Conform 7. Secured with Tape Notes Silvercell rope between toes Electronic Signature(s) Signed: 06/23/2017 4:55:44 PM By: Alejandro MullingPinkerton, Debra Entered By: Alejandro MullingPinkerton, Debra on 06/23/2017 11:10:17 [name], Zaevion ([id number]) -------------------------------------------------------------------------------- Vitals Details Patient Name: [name], Verlyn Date of  Service: 06/23/2017 10:15 AM Medical Record Number: [id number] Patient Account Number: [id number] Date of Birth/Sex: 09/18/1945 (71 y.o. Male) Treating RN: Ashok CordiaPinkerton, Debi Primary Care Letasha Kershaw: Jaclyn ShaggyAMAO, ENOBONG Other Clinician: Referring Jamone Garrido: Jaclyn ShaggyAMAO, ENOBONG Treating Azelie Noguera/Extender: Rudene ReBritto, Errol Weeks in Treatment: 1 Vital Signs Time Taken: 10:56 Temperature (F): 98.2 Height (in): 70 Pulse (bpm): 97 Weight (lbs): 177 Respiratory Rate (breaths/min): 20 Body Mass Index (BMI): 25.4 Blood Pressure (mmHg): 119/61 Reference Range: 80 - 120 mg / dl Electronic Signature(s) Signed: 06/23/2017 4:55:44 PM By: Alejandro MullingPinkerton, Debra Entered By: Alejandro MullingPinkerton, Debra on 06/23/2017 10:57:18

## 2017-06-24 NOTE — Progress Notes (Signed)
[name], [name] ([id number]) Visit Report for 06/23/2017 Chief Complaint Document Details Patient Name: [name], [name] Date of Service: 06/23/2017 10:15 AM Medical Record Number: [id number] Patient Account Number: [id number] Date of Birth/Sex: March 11, 1946 (71 y.o. Male) Treating RN: Huel CoventryWoody, Kim Primary Care Provider: Jaclyn ShaggyAMAO, ENOBONG Other Clinician: Referring Provider: Jaclyn ShaggyAMAO, ENOBONG Treating Provider/Extender: Rudene ReBritto, Braiden Presutti Weeks in Treatment: 1 Information Obtained from: Patient Chief Complaint Patient presents to the wound care center today with an open arterial ulcer to the right third toe which has been there for about 2 weeks Electronic Signature(s) Signed: 06/23/2017 11:16:44 AM By: Evlyn KannerBritto, Myleen Brailsford MD, FACS Entered By: Evlyn KannerBritto, Brittanni Cariker on 06/23/2017 11:16:43 [name], [name] ([id number]) -------------------------------------------------------------------------------- HPI Details Patient Name: [name], [name] Date of Service: 06/23/2017 10:15 AM Medical Record Number: [id number] Patient Account Number: [id number] Date of Birth/Sex: March 11, 1946 (71 y.o. Male) Treating RN: Huel CoventryWoody, Kim Primary Care Provider: Jaclyn ShaggyAMAO, ENOBONG Other Clinician: Referring Provider: Jaclyn ShaggyAMAO, ENOBONG Treating Provider/Extender: Rudene ReBritto, Pricilla Moehle Weeks in Treatment: 1 History of Present Illness Location: right third toe Quality: Patient reports experiencing a sharp pain to affected area(s). Severity: Patient states wound are getting worse. Duration: Patient has had the wound for < 2 weeks prior to presenting for treatment Timing: Pain in wound is Intermittent (comes and goes Context: The wound would happen gradually Modifying Factors: Other treatment(s) tried include:had arterial surgery recently Associated Signs and Symptoms: Patient reports having increase discharge. HPI Description: 71 year old diabetic who has recently been reviewed by Dr. Leonides SakeBrian Chen for a postop follow-up for a right femoropopliteal artery, right BK popliteal with  BPA, and anastomosis to the right PT and AT. 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 The patient is having a third toe ulcer and there is some distal foot issues. the patient was recommended to have a duplex study in 2 months and at that time would be reevaluated. The patient was referred to our center for wound care. Past medical history significant for type 2 diabetes mellitus last hemoglobin A1c was 8.8%, previous CVA, coronary artery disease status post 2 vessel CABG in 2007, CHF,hyperlipidemia, hypertension, pulmonary embolus. The last ABI study done 04/27/2017 showed a right ABI of 0.55 and a left ABI of 0.94 with monophasic flow on the right and biphasic flow on the left Electronic Signature(s) Signed: 06/23/2017 11:17:48 AM By: Evlyn KannerBritto, Vasilia Dise MD, FACS Entered By: Evlyn KannerBritto, Odel Schmid on 06/23/2017 11:17:48 [name], [name] ([id number]) -------------------------------------------------------------------------------- Physical Exam Details Patient Name: [name], [name] Date of Service: 06/23/2017 10:15 AM Medical Record Number: [id number] Patient Account Number: [id number] Date of Birth/Sex: March 11, 1946 6(71 y.o. Male) Treating RN: Huel CoventryWoody, Kim Primary Care Provider: Jaclyn ShaggyAMAO, ENOBONG Other Clinician: Referring Provider: Jaclyn ShaggyAMAO, ENOBONG Treating Provider/Extender: Rudene ReBritto, Maloni Musleh Weeks in Treatment: 1 Constitutional . Pulse regular. Respirations normal and unlabored. Afebrile. . Eyes Nonicteric. Reactive to light. Ears, Nose, Mouth, and Throat Lips, teeth, and gums WNL.Marland Kitchen. Moist mucosa without lesions. Neck supple and nontender. No palpable supraclavicular or cervical adenopathy. Normal sized without goiter. Respiratory WNL. No retractions.. Cardiovascular Pedal Pulses WNL. No clubbing, cyanosis or edema. Lymphatic No adneopathy. No adenopathy. No adenopathy. Musculoskeletal Adexa without tenderness or enlargement.. Digits and nails w/o  clubbing, cyanosis, infection, petechiae, ischemia, or inflammatory conditions.. Integumentary (Hair, Skin) No suspicious lesions. No crepitus or fluctuance. No peri-wound warmth or erythema. No masses.Marland Kitchen. Psychiatric Judgement and insight Intact.. No evidence of depression, anxiety, or agitation.. Notes The maceration between his toes much better in general and the third toe dorsum and nailbed continues to have some eschar and it  is too tender to touch. No sharp debridement was attempted and we will await demarcation of this toe Electronic Signature(s) Signed: 06/23/2017 11:18:57 AM By: Evlyn Kanner MD, FACS Entered By: Evlyn Kanner on 06/23/2017 11:18:54 Devin Rhyme ([id number]) -------------------------------------------------------------------------------- Physician Orders Details Patient Name: Devin Rhyme Date of Service: 06/23/2017 10:15 AM Medical Record Number: [id number] Patient Account Number: [id number] Date of Birth/Sex: 30-Mar-1946 (71 y.o. Male) Treating RN: Ashok Cordia, Debi Primary Care Provider: Jaclyn Shaggy Other Clinician: Referring Provider: Jaclyn Shaggy Treating Provider/Extender: Rudene Re in Treatment: 1 Verbal / Phone Orders: Yes Clinician: Pinkerton, Debi Read Back and Verified: Yes Diagnosis Coding Wound Cleansing Wound #1 Right Toe Third o Clean wound with Normal Saline. o Cleanse wound with mild soap and water o May Shower, gently pat wound dry prior to applying new dressing. o No tub bath. Anesthetic Wound #1 Right Toe Third o Topical Lidocaine 4% cream applied to wound bed prior to debridement Primary Wound Dressing Wound #1 Right Toe Third o Other: - Silvercell rope Secondary Dressing Wound #1 Right Toe Third o Conform/Kerlix - stretch net Dressing Change Frequency Wound #1 Right Toe Third o Change dressing every other day. Follow-up Appointments Wound #1 Right Toe Third o Return Appointment in 1  week. Electronic Signature(s) Signed: 06/23/2017 12:44:08 PM By: Evlyn Kanner MD, FACS Signed: 06/23/2017 4:55:44 PM By: Alejandro Mulling Entered By: Alejandro Mulling on 06/23/2017 11:15:49 Devin Rhyme ([id number]) -------------------------------------------------------------------------------- Problem List Details Patient Name: Devin Rhyme Date of Service: 06/23/2017 10:15 AM Medical Record Number: [id number] Patient Account Number: [id number] Date of Birth/Sex: Jul 04, 1946 (71 y.o. Male) Treating RN: Huel Coventry Primary Care Provider: Jaclyn Shaggy Other Clinician: Referring Provider: Jaclyn Shaggy Treating Provider/Extender: Rudene Re in Treatment: 1 Active Problems ICD-10 Encounter Code Description Active Date Diagnosis E11.621 Type 2 diabetes mellitus with foot ulcer 06/16/2017 Yes I73.9 Peripheral vascular disease, unspecified 06/16/2017 Yes L97.512 Non-pressure chronic ulcer of other part of right foot with fat layer 06/16/2017 Yes exposed Inactive Problems Resolved Problems Electronic Signature(s) Signed: 06/23/2017 11:16:14 AM By: Evlyn Kanner MD, FACS Entered By: Evlyn Kanner on 06/23/2017 11:16:12 Devin Rhyme ([id number]) -------------------------------------------------------------------------------- Progress Note Details Patient Name: Devin Rhyme Date of Service: 06/23/2017 10:15 AM Medical Record Number: [id number] Patient Account Number: [id number] Date of Birth/Sex: [birth year] (71 y.o. Male) Treating RN: Huel Coventry Primary Care Provider: Jaclyn Shaggy Other Clinician: Referring Provider: Jaclyn Shaggy Treating Provider/Extender: Rudene Re in Treatment: 1 Subjective Chief Complaint Information obtained from Patient Patient presents to the wound care center today with an open arterial ulcer to the right third toe which has been there for about 2 weeks History of Present Illness (HPI) The following HPI elements were documented for the  patient's wound: Location: right third toe Quality: Patient reports experiencing a sharp pain to affected area(s). Severity: Patient states wound are getting worse. Duration: Patient has had the wound for < 2 weeks prior to presenting for treatment Timing: Pain in wound is Intermittent (comes and goes Context: The wound would happen gradually Modifying Factors: Other treatment(s) tried include:had arterial surgery recently Associated Signs and Symptoms: Patient reports having increase discharge. 71 year old diabetic who has recently been reviewed by Dr. Leonides Sake for a postop follow-up for a right femoropopliteal artery, right BK popliteal with BPA, and anastomosis to the right PT and AT. 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 The patient is having a third toe ulcer and there is some distal foot issues. the patient was recommended to  have a duplex study in 2 months and at that time would be reevaluated. The patient was referred to our center for wound care. Past medical history significant for type 2 diabetes mellitus last hemoglobin A1c was 8.8%, previous CVA, coronary artery disease status post 2 vessel CABG in 2007, CHF,hyperlipidemia, hypertension, pulmonary embolus. The last ABI study done 04/27/2017 showed a right ABI of 0.55 and a left ABI of 0.94 with monophasic flow on the right and biphasic flow on the left Patient History Information obtained from Patient. Social History Never smoker, Marital Status - Married, Alcohol Use - Never, Drug Use - No History, Caffeine Use - Daily. Medical History Hospitalization/Surgery History - 04/28/2017, [place], [facility]. [place], [place] ([id number]) Objective Constitutional Pulse regular. Respirations normal and unlabored. Afebrile. Vitals Time Taken: 10:56 AM, Height: 70 in, Weight: 177 lbs, BMI: 25.4, Temperature: 98.2 F, Pulse: 97 bpm, Respiratory Rate: 20 breaths/min, Blood  Pressure: 119/61 mmHg. Eyes Nonicteric. Reactive to light. Ears, Nose, Mouth, and Throat Lips, teeth, and gums WNL.Marland Kitchen Moist mucosa without lesions. Neck supple and nontender. No palpable supraclavicular or cervical adenopathy. Normal sized without goiter. Respiratory WNL. No retractions.. Cardiovascular Pedal Pulses WNL. No clubbing, cyanosis or edema. Lymphatic No adneopathy. No adenopathy. No adenopathy. Musculoskeletal Adexa without tenderness or enlargement.. Digits and nails w/o clubbing, cyanosis, infection, petechiae, ischemia, or inflammatory conditions.Marland Kitchen Psychiatric Judgement and insight Intact.. No evidence of depression, anxiety, or agitation.. General Notes: The maceration between his toes much better in general and the third toe dorsum and nailbed continues to have some eschar and it is too tender to touch. No sharp debridement was attempted and we will await demarcation of this toe Integumentary (Hair, Skin) No suspicious lesions. No crepitus or fluctuance. No peri-wound warmth or erythema. No masses.. Wound #1 status is Open. Original cause of wound was Trauma. The wound is located on the Right Toe Third. The wound measures 0.5cm length x 0.5cm width x 0.1cm depth; 0.196cm^2 area and 0.02cm^3 volume. The wound is limited to skin breakdown. There is no tunneling or undermining noted. There is a medium amount of serous drainage noted. The wound margin is indistinct and nonvisible. There is no granulation within the wound bed. There is a large (67-100%) amount of necrotic tissue within the wound bed including Eschar and Adherent Slough. Assessment Active Problems ICD-10 E11.621 - Type 2 diabetes mellitus with foot ulcer I73.9 - Peripheral vascular disease, unspecified Witthuhn, Eben ([id number]) L97.512 - Non-pressure chronic ulcer of other part of right foot with fat layer exposed Plan Wound Cleansing: Wound #1 Right Toe Third: Clean wound with Normal Saline. Cleanse  wound with mild soap and water May Shower, gently pat wound dry prior to applying new dressing. No tub bath. Anesthetic: Wound #1 Right Toe Third: Topical Lidocaine 4% cream applied to wound bed prior to debridement Primary Wound Dressing: Wound #1 Right Toe Third: Other: - Silvercell rope Secondary Dressing: Wound #1 Right Toe Third: Conform/Kerlix - stretch net Dressing Change Frequency: Wound #1 Right Toe Third: Change dressing every other day. Follow-up Appointments: Wound #1 Right Toe Third: Return Appointment in 1 week. After review and discussing with the daughter, who is the caregiver and translates from the [place] language, I have recommended: 1. Silver alginate to be placed between his toes with special emphasis on the right third toe. 2. appropriate management of the hygiene between his toes 3. adequate control of his diabetes mellitus as treated and managed by his PCP 4. adequate protein, vitamin A, vitamin C and  zinc 5. Regular visits to the wound center Electronic Signature(s) Signed: 06/23/2017 11:20:25 AM By: Evlyn Kanner MD, FACS Entered By: Evlyn Kanner on 06/23/2017 11:20:25 Devin Rhyme ([id number]) -------------------------------------------------------------------------------- ROS/PFSH Details Patient Name: Devin Rhyme Date of Service: 06/23/2017 10:15 AM Medical Record Number: [id number] Patient Account Number: [id number] Date of Birth/Sex: Dec 20, 1945 (71 y.o. Male) Treating RN: Huel Coventry Primary Care Provider: Jaclyn Shaggy Other Clinician: Referring Provider: Jaclyn Shaggy Treating Provider/Extender: Rudene Re in Treatment: 1 Information Obtained From Patient Wound History Do you currently have one or more open woundso Yes How many open wounds do you currently haveo 1 Approximately how long have you had your woundso months How have you been treating your wound(s) until nowo Antibiotics Has your wound(s) ever healed and then re-openedo  No Have you had any lab work done in the past montho Yes Have you tested positive for an antibiotic resistant organism (MRSA, VRE)o No Have you had any tests for circulation on your legso Yes Eyes Medical History: Negative for: Cataracts; Glaucoma; Optic Neuritis Ear/Nose/Mouth/Throat Medical History: Negative for: Chronic sinus problems/congestion; Middle ear problems Hematologic/Lymphatic Medical History: Negative for: Anemia; Hemophilia; Human Immunodeficiency Virus; Lymphedema; Sickle Cell Disease Respiratory Medical History: Negative for: Aspiration; Asthma; Chronic Obstructive Pulmonary Disease (COPD); Pneumothorax; Sleep Apnea; Tuberculosis Cardiovascular Medical History: Positive for: Congestive Heart Failure; Coronary Artery Disease; Deep Vein Thrombosis; Hypertension - controlled with meds; Peripheral Arterial Disease Negative for: Angina; Arrhythmia; Hypotension; Myocardial Infarction; Peripheral Venous Disease; Phlebitis; Vasculitis Gastrointestinal Medical History: Negative for: Cirrhosis ; Colitis; Crohnos; Hepatitis A; Hepatitis B; Hepatitis C Endocrine Medical History: Positive for: Type II Diabetes Negative for: Type I Diabetes EGNOR, Tripp ([id number]) Time with diabetes: 10 years Treated with: Insulin Blood sugar tested every day: Yes Tested : 2-3 Blood sugar testing results: Breakfast: 131; Bedtime: 230 Genitourinary Medical History: Negative for: End Stage Renal Disease Immunological Medical History: Negative for: Lupus Erythematosus; Raynaudos; Scleroderma Integumentary (Skin) Medical History: Negative for: History of Burn; History of pressure wounds Musculoskeletal Medical History: Negative for: Gout; Rheumatoid Arthritis; Osteoarthritis; Osteomyelitis Neurologic Medical History: Positive for: Neuropathy Negative for: Dementia; Quadriplegia; Paraplegia; Seizure Disorder Oncologic Medical History: Negative for: Received Chemotherapy;  Received Radiation Psychiatric Medical History: Negative for: Anorexia/bulimia; Confinement Anxiety Immunizations Pneumococcal Vaccine: Received Pneumococcal Vaccination: Yes Implantable Devices Hospitalization / Surgery History Name of Hospital Purpose of Hospitalization/Surgery Date [place] SOB 04/28/2017 Family and Social History Never smoker; Marital Status - Married; Alcohol Use: Never; Drug Use: No History; Caffeine Use: Daily; Advanced Directives: No; Patient does not want information on Advanced Directives; Do not resuscitate: No; Living Will: No; Medical Power of Attorney: No Physician Damarcus Reggio, Kushal ([id number]) I have reviewed and agree with the above information. Electronic Signature(s) Signed: 06/23/2017 12:44:08 PM By: Evlyn Kanner MD, FACS Signed: 06/23/2017 4:55:21 PM By: Elliot Gurney BSN, RN, CWS, Kim RN, BSN Entered By: Evlyn Kanner on 06/23/2017 11:18:02 Devin Rhyme ([id number]) -------------------------------------------------------------------------------- SuperBill Details Patient Name: Devin Rhyme Date of Service: 06/23/2017 Medical Record Number: [id number] Patient Account Number: [id number] Date of Birth/Sex: 14-Jul-1946 (71 y.o. Male) Treating RN: Huel Coventry Primary Care Provider: Jaclyn Shaggy Other Clinician: Referring Provider: Jaclyn Shaggy Treating Provider/Extender: Rudene Re in Treatment: 1 Diagnosis Coding ICD-10 Codes Code Description E11.621 Type 2 diabetes mellitus with foot ulcer I73.9 Peripheral vascular disease, unspecified L97.512 Non-pressure chronic ulcer of other part of right foot with fat layer exposed Facility Procedures CPT4 Code: [id number] Description: 99213 - WOUND CARE VISIT-LEV 3 EST PT Modifier: Quantity: 1 Physician Procedures CPT4 Code: [id number] Description:  99213 - WC PHYS LEVEL 3 - EST PT ICD-10 Diagnosis Description E11.621 Type 2 diabetes mellitus with foot ulcer I73.9 Peripheral vascular disease,  unspecified L97.512 Non-pressure chronic ulcer of other part of right foot with fat Modifier: layer exposed Quantity: 1 Electronic Signature(s) Signed: 06/23/2017 11:39:01 AM By: Alejandro MullingPinkerton, Debra Signed: 06/23/2017 12:44:08 PM By: Evlyn KannerBritto, Jahayra Mazo MD, FACS Previous Signature: 06/23/2017 11:20:50 AM Version By: Evlyn KannerBritto, Finas Delone MD, FACS Entered By: Alejandro MullingPinkerton, Debra on 06/23/2017 11:39:00

## 2017-06-24 NOTE — Telephone Encounter (Signed)
Daughter of patient called with concerns that patient is losing weight. At last OV  In October  Pt weight was 179 lbs. He is not at 170 lbs.  Pt f/u with wound care doctor. He does not have an appetite. He drinks protein shakes daily and may eat a few bites of rice, beans or eggs. He does not have an appetite.   Daughter adds that pt slipped on the floor in the bathroom last week.Pt sustained a cut on his finger. He will go to wound care doctor this week.  Advised that patient should have been evaluated since he is taking coumadin. Pt daughter verbalized understanding but denies LOC or deformities in arms or legs.  Denies patient being lethargic or disoriented.

## 2017-06-30 ENCOUNTER — Other Ambulatory Visit: Payer: Self-pay | Admitting: Surgery

## 2017-06-30 ENCOUNTER — Other Ambulatory Visit: Payer: Self-pay

## 2017-06-30 ENCOUNTER — Other Ambulatory Visit: Payer: Self-pay | Admitting: Physical Medicine & Rehabilitation

## 2017-06-30 ENCOUNTER — Ambulatory Visit
Admission: RE | Admit: 2017-06-30 | Discharge: 2017-06-30 | Disposition: A | Discharge status: Home / Self Care | Payer: Self-pay | Source: Ambulatory Visit | Attending: Surgery | Admitting: Surgery

## 2017-06-30 ENCOUNTER — Encounter: Payer: Self-pay | Admitting: Surgery

## 2017-06-30 ENCOUNTER — Encounter: Payer: Self-pay | Admitting: Physician Assistant

## 2017-06-30 ENCOUNTER — Emergency Department (HOSPITAL_COMMUNITY): Payer: Self-pay

## 2017-06-30 ENCOUNTER — Inpatient Hospital Stay (HOSPITAL_COMMUNITY)
Admission: EM | Admit: 2017-06-30 | Discharge: 2017-07-15 | DRG: 193 | Disposition: A | Discharge status: Hospice | Payer: Self-pay | Attending: Internal Medicine | Admitting: Internal Medicine

## 2017-06-30 ENCOUNTER — Ambulatory Visit: Payer: Self-pay | Attending: Family Medicine | Admitting: Physician Assistant

## 2017-06-30 ENCOUNTER — Encounter (HOSPITAL_COMMUNITY): Payer: Self-pay

## 2017-06-30 VITALS — BP 93/62 | HR 90 | Temp 97.4°F | Resp 16 | Wt 169.2 lb

## 2017-06-30 DIAGNOSIS — Z794 Long term (current) use of insulin: Secondary | ICD-10-CM

## 2017-06-30 DIAGNOSIS — I471 Supraventricular tachycardia: Secondary | ICD-10-CM | POA: Diagnosis present

## 2017-06-30 DIAGNOSIS — S81801A Unspecified open wound, right lower leg, initial encounter: Secondary | ICD-10-CM

## 2017-06-30 DIAGNOSIS — I493 Ventricular premature depolarization: Secondary | ICD-10-CM | POA: Diagnosis present

## 2017-06-30 DIAGNOSIS — M79671 Pain in right foot: Secondary | ICD-10-CM

## 2017-06-30 DIAGNOSIS — I10 Essential (primary) hypertension: Secondary | ICD-10-CM | POA: Diagnosis present

## 2017-06-30 DIAGNOSIS — N390 Urinary tract infection, site not specified: Secondary | ICD-10-CM

## 2017-06-30 DIAGNOSIS — R0602 Shortness of breath: Secondary | ICD-10-CM

## 2017-06-30 DIAGNOSIS — Z951 Presence of aortocoronary bypass graft: Secondary | ICD-10-CM | POA: Insufficient documentation

## 2017-06-30 DIAGNOSIS — X58XXXA Exposure to other specified factors, initial encounter: Secondary | ICD-10-CM | POA: Insufficient documentation

## 2017-06-30 DIAGNOSIS — Z8673 Personal history of transient ischemic attack (TIA), and cerebral infarction without residual deficits: Secondary | ICD-10-CM

## 2017-06-30 DIAGNOSIS — I5043 Acute on chronic combined systolic (congestive) and diastolic (congestive) heart failure: Secondary | ICD-10-CM | POA: Diagnosis present

## 2017-06-30 DIAGNOSIS — R634 Abnormal weight loss: Secondary | ICD-10-CM | POA: Insufficient documentation

## 2017-06-30 DIAGNOSIS — R404 Transient alteration of awareness: Secondary | ICD-10-CM

## 2017-06-30 DIAGNOSIS — R069 Unspecified abnormalities of breathing: Secondary | ICD-10-CM

## 2017-06-30 DIAGNOSIS — J9601 Acute respiratory failure with hypoxia: Secondary | ICD-10-CM | POA: Diagnosis present

## 2017-06-30 DIAGNOSIS — I5023 Acute on chronic systolic (congestive) heart failure: Secondary | ICD-10-CM | POA: Diagnosis present

## 2017-06-30 DIAGNOSIS — E1165 Type 2 diabetes mellitus with hyperglycemia: Secondary | ICD-10-CM | POA: Insufficient documentation

## 2017-06-30 DIAGNOSIS — Z7982 Long term (current) use of aspirin: Secondary | ICD-10-CM | POA: Insufficient documentation

## 2017-06-30 DIAGNOSIS — I2510 Atherosclerotic heart disease of native coronary artery without angina pectoris: Secondary | ICD-10-CM | POA: Insufficient documentation

## 2017-06-30 DIAGNOSIS — E785 Hyperlipidemia, unspecified: Secondary | ICD-10-CM | POA: Insufficient documentation

## 2017-06-30 DIAGNOSIS — E440 Moderate protein-calorie malnutrition: Secondary | ICD-10-CM | POA: Diagnosis present

## 2017-06-30 DIAGNOSIS — Z515 Encounter for palliative care: Secondary | ICD-10-CM

## 2017-06-30 DIAGNOSIS — R0600 Dyspnea, unspecified: Secondary | ICD-10-CM

## 2017-06-30 DIAGNOSIS — R531 Weakness: Secondary | ICD-10-CM

## 2017-06-30 DIAGNOSIS — Z6823 Body mass index (BMI) 23.0-23.9, adult: Secondary | ICD-10-CM

## 2017-06-30 DIAGNOSIS — G9340 Encephalopathy, unspecified: Secondary | ICD-10-CM

## 2017-06-30 DIAGNOSIS — I5042 Chronic combined systolic (congestive) and diastolic (congestive) heart failure: Secondary | ICD-10-CM | POA: Insufficient documentation

## 2017-06-30 DIAGNOSIS — Z7189 Other specified counseling: Secondary | ICD-10-CM

## 2017-06-30 DIAGNOSIS — G9341 Metabolic encephalopathy: Secondary | ICD-10-CM | POA: Diagnosis present

## 2017-06-30 DIAGNOSIS — Z79899 Other long term (current) drug therapy: Secondary | ICD-10-CM

## 2017-06-30 DIAGNOSIS — Z86711 Personal history of pulmonary embolism: Secondary | ICD-10-CM

## 2017-06-30 DIAGNOSIS — R8281 Pyuria: Secondary | ICD-10-CM

## 2017-06-30 DIAGNOSIS — Z7901 Long term (current) use of anticoagulants: Secondary | ICD-10-CM

## 2017-06-30 DIAGNOSIS — I110 Hypertensive heart disease with heart failure: Secondary | ICD-10-CM | POA: Diagnosis present

## 2017-06-30 DIAGNOSIS — E1151 Type 2 diabetes mellitus with diabetic peripheral angiopathy without gangrene: Secondary | ICD-10-CM | POA: Diagnosis present

## 2017-06-30 DIAGNOSIS — E119 Type 2 diabetes mellitus without complications: Secondary | ICD-10-CM

## 2017-06-30 DIAGNOSIS — E1142 Type 2 diabetes mellitus with diabetic polyneuropathy: Secondary | ICD-10-CM | POA: Diagnosis present

## 2017-06-30 DIAGNOSIS — I959 Hypotension, unspecified: Secondary | ICD-10-CM | POA: Diagnosis not present

## 2017-06-30 DIAGNOSIS — R079 Chest pain, unspecified: Secondary | ICD-10-CM

## 2017-06-30 DIAGNOSIS — R1310 Dysphagia, unspecified: Secondary | ICD-10-CM | POA: Diagnosis present

## 2017-06-30 DIAGNOSIS — G8929 Other chronic pain: Secondary | ICD-10-CM | POA: Diagnosis present

## 2017-06-30 DIAGNOSIS — E11649 Type 2 diabetes mellitus with hypoglycemia without coma: Secondary | ICD-10-CM | POA: Diagnosis present

## 2017-06-30 DIAGNOSIS — J189 Pneumonia, unspecified organism: Principal | ICD-10-CM | POA: Diagnosis present

## 2017-06-30 DIAGNOSIS — Z8249 Family history of ischemic heart disease and other diseases of the circulatory system: Secondary | ICD-10-CM

## 2017-06-30 DIAGNOSIS — I509 Heart failure, unspecified: Secondary | ICD-10-CM

## 2017-06-30 DIAGNOSIS — S91104A Unspecified open wound of right lesser toe(s) without damage to nail, initial encounter: Secondary | ICD-10-CM | POA: Insufficient documentation

## 2017-06-30 DIAGNOSIS — I63511 Cerebral infarction due to unspecified occlusion or stenosis of right middle cerebral artery: Secondary | ICD-10-CM | POA: Diagnosis present

## 2017-06-30 DIAGNOSIS — I472 Ventricular tachycardia: Secondary | ICD-10-CM | POA: Diagnosis present

## 2017-06-30 DIAGNOSIS — R627 Adult failure to thrive: Secondary | ICD-10-CM | POA: Diagnosis present

## 2017-06-30 DIAGNOSIS — R4182 Altered mental status, unspecified: Secondary | ICD-10-CM | POA: Insufficient documentation

## 2017-06-30 HISTORY — DX: Cerebrovascular disease, unspecified: I67.9

## 2017-06-30 HISTORY — DX: Embolism and thrombosis of arteries of the lower extremities: I74.3

## 2017-06-30 HISTORY — DX: Thoracic aortic aneurysm, without rupture, unspecified: I71.20

## 2017-06-30 HISTORY — DX: Ventricular premature depolarization: I49.3

## 2017-06-30 HISTORY — DX: Thoracic aortic aneurysm, without rupture: I71.2

## 2017-06-30 HISTORY — DX: Chronic systolic (congestive) heart failure: I50.22

## 2017-06-30 LAB — I-STAT ARTERIAL BLOOD GAS, ED
Acid-base deficit: 1 mmol/L (ref 0.0–2.0)
Bicarbonate: 22 mmol/L (ref 20.0–28.0)
O2 Saturation: 97 %
PH ART: 7.484 — AB (ref 7.350–7.450)
PO2 ART: 79 mmHg — AB (ref 83.0–108.0)
TCO2: 23 mmol/L (ref 22–32)
pCO2 arterial: 29.3 mmHg — ABNORMAL LOW (ref 32.0–48.0)

## 2017-06-30 LAB — CBC
HEMATOCRIT: 33.2 % — AB (ref 39.0–52.0)
HEMOGLOBIN: 12.2 g/dL — AB (ref 13.0–17.0)
MCH: 25.3 pg — AB (ref 26.0–34.0)
MCHC: 36.7 g/dL — AB (ref 30.0–36.0)
MCV: 68.7 fL — ABNORMAL LOW (ref 78.0–100.0)
Platelets: 244 10*3/uL (ref 150–400)
RBC: 4.83 MIL/uL (ref 4.22–5.81)
RDW: 15.1 % (ref 11.5–15.5)
WBC: 6.3 10*3/uL (ref 4.0–10.5)

## 2017-06-30 LAB — POCT URINALYSIS DIPSTICK
BILIRUBIN UA: NEGATIVE
Blood, UA: NEGATIVE
Glucose, UA: NEGATIVE
KETONES UA: NEGATIVE
Nitrite, UA: NEGATIVE
PH UA: 5.5 (ref 5.0–8.0)
Protein, UA: 30
Spec Grav, UA: 1.01 (ref 1.010–1.025)
Urobilinogen, UA: 1 E.U./dL

## 2017-06-30 LAB — URINALYSIS, ROUTINE W REFLEX MICROSCOPIC
Bilirubin Urine: NEGATIVE
Glucose, UA: NEGATIVE mg/dL
Hgb urine dipstick: NEGATIVE
Ketones, ur: NEGATIVE mg/dL
Nitrite: NEGATIVE
PROTEIN: NEGATIVE mg/dL
Specific Gravity, Urine: 1.018 (ref 1.005–1.030)
pH: 5 (ref 5.0–8.0)

## 2017-06-30 LAB — GLUCOSE, POCT (MANUAL RESULT ENTRY): POC Glucose: 169 mg/dl — AB (ref 70–99)

## 2017-06-30 LAB — I-STAT TROPONIN, ED
TROPONIN I, POC: 0.05 ng/mL (ref 0.00–0.08)
Troponin i, poc: 0.02 ng/mL (ref 0.00–0.08)

## 2017-06-30 LAB — BASIC METABOLIC PANEL
ANION GAP: 9 (ref 5–15)
BUN: 22 mg/dL — ABNORMAL HIGH (ref 6–20)
CALCIUM: 9.4 mg/dL (ref 8.9–10.3)
CO2: 24 mmol/L (ref 22–32)
Chloride: 100 mmol/L — ABNORMAL LOW (ref 101–111)
Creatinine, Ser: 1.04 mg/dL (ref 0.61–1.24)
GFR calc Af Amer: >60 mL/min (ref >60)
GFR calc non Af Amer: >60 mL/min (ref >60)
GLUCOSE: 153 mg/dL — AB (ref 65–99)
POTASSIUM: 4.1 mmol/L (ref 3.5–5.1)
Sodium: 133 mmol/L — ABNORMAL LOW (ref 135–145)

## 2017-06-30 LAB — HEPATIC FUNCTION PANEL
ALT: 23 U/L (ref 17–63)
AST: 21 U/L (ref 15–41)
Albumin: 3.7 g/dL (ref 3.5–5.0)
Alkaline Phosphatase: 104 U/L (ref 38–126)
BILIRUBIN DIRECT: 0.3 mg/dL (ref 0.1–0.5)
Indirect Bilirubin: 0.9 mg/dL (ref 0.3–0.9)
Total Bilirubin: 1.2 mg/dL (ref 0.3–1.2)
Total Protein: 6.4 g/dL — ABNORMAL LOW (ref 6.5–8.1)

## 2017-06-30 LAB — BRAIN NATRIURETIC PEPTIDE: B NATRIURETIC PEPTIDE 5: 2434.3 pg/mL — AB (ref 0.0–100.0)

## 2017-06-30 LAB — CBG MONITORING, ED: Glucose-Capillary: 149 mg/dL — ABNORMAL HIGH (ref 65–99)

## 2017-06-30 LAB — I-STAT CG4 LACTIC ACID, ED
LACTIC ACID, VENOUS: 1.53 mmol/L (ref 0.5–1.9)
Lactic Acid, Venous: 1.34 mmol/L (ref 0.5–1.9)

## 2017-06-30 LAB — AMMONIA: AMMONIA: 39 umol/L — AB (ref 9–35)

## 2017-06-30 MED ORDER — FUROSEMIDE 10 MG/ML IJ SOLN
40.0000 mg | Freq: Once | INTRAMUSCULAR | Status: AC
  Administered 2017-06-30: 40 mg via INTRAVENOUS
  Filled 2017-06-30: qty 4

## 2017-06-30 NOTE — ED Notes (Signed)
Patient returned from CT. Patient made aware of need for urine sample - states he is going to try and give one.

## 2017-06-30 NOTE — ED Notes (Signed)
Pt unable to ambulate at this time; patient c/o of weakness;EDP made aware

## 2017-06-30 NOTE — ED Provider Notes (Signed)
[facility] EMERGENCY DEPARTMENT Provider Note  CSN: [id number] Arrival date & time: 06/30/17  1623 History   Chief Complaint Chief Complaint  Patient presents with  . Dehydration   HPI [name] is a 71 y.o. male with PMH of CHF and CAD who presented with worsening generalized weakness and confusion over the past couple of weeks. The patient's daughter is at the patient's bedside and states that he had a stoke about 2 months ago, at that time he presented with left sided weakness. The patient has been getting home rehab however continues to become weaker. Today the patient presented at to his PCP's clinic with increased somnolence, 11 lb weight lost since last month, poor appetite and, worsening generalized weakness. Daughter states that the patient has also been having difficulty with short term memory over the past 2 weeks. The patient endorses worsening shortness of breath with exertion and having to use multiple pillows to sleep at night due to inability to lay flat. Patient daughter states that the patient's symptoms wax and wane, do not appear to be caused by anything, do not appear to be made better by anything. He has not had any recent fever or chills.   HPI  Past Medical History:  Diagnosis Date  . CAD (coronary artery disease) 2007   CABG 2007 in [place]  . Chronic combined systolic and diastolic heart failure ([place])    a. EF 20-25% by echo in 03/2017 with NST showing prior infarct but no ischemia.   . Diabetes mellitus without complication ([place])   . Hyperlipidemia   . Hypertension   . Pulmonary embolism ([place])   . Stroke [facility])    Patient Active Problem List   Diagnosis Date Noted  . CAD (coronary artery disease) 05/27/2017  . Chronic combined systolic and diastolic heart failure ([place]) 05/27/2017  . Long term (current) use of anticoagulants [Z79.01] 05/16/2017  . Chronic anticoagulation   . Shortness of breath   . Diabetes mellitus type 2 in nonobese  ([name])   . Subtherapeutic anticoagulation   . Neuropathic pain   . AKI (acute kidney injury) ([place])   . 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 Coronary artery disease involving coronary bypass graft of native heart without angina pectoris   . Type 2 diabetes mellitus with peripheral neuropathy ([name])   . Benign essential HTN   . Hyperlipidemia   . Acute blood loss anemia   . Anemia of chronic disease   . History of medication noncompliance   . Ischemia of right lower extremity   . Endotracheally intubated   . Ischemia of left lower extremity   . Respiratory failure ([place])   . Occlusion of left carotid artery   . Urinary retention   . Ischemic cardiomyopathy   . S/P CABG (coronary artery bypass graft)   . Cerebral embolism with cerebral infarction 04/25/2017  . Acute on chronic systolic heart failure ([place]) 04/24/2017  . Hyponatremia 04/24/2017  . CHF (congestive heart failure) ([place]) 04/17/2017   Past Surgical History:  Procedure Laterality Date  . CARDIAC SURGERY    . IR PERCUTANEOUS ART THROMBECTOMY/INFUSION INTRACRANIAL INC DIAG ANGIO  04/25/2017  . PROSTATE SURGERY  2002    Home Medications    Prior to Admission medications   Medication Sig Start Date End Date Taking? Authorizing Provider  acetaminophen (TYLENOL) 325 MG tablet Take 2 tablets (650 mg total) by mouth every 4 (four) hours as needed for headache or mild pain. 05/05/17  Rosita Fire, Brittainy M, PA-C  aspirin EC 81 MG EC tablet Take 1 tablet (81 mg total) by mouth daily. 05/05/17   Lyda Jester M, PA-C  atorvastatin (LIPITOR) 80 MG tablet Take 1 tablet (80 mg total) by mouth daily at 6 PM. 05/12/17   Angiulli, Lavon Paganini, PA-C  Blood Glucose Monitoring Suppl (TRUE METRIX METER) DEVI 1 kit by Does not apply route 4 (four) times daily. 05/12/17   Angiulli, Lavon Paganini, PA-C  cephALEXin (KEFLEX) 500 MG capsule Take 1 capsule (500 mg total) by mouth 2 (two) times daily. Patient not taking: Reported on  06/30/2017 06/02/17   Arnoldo Morale, MD  docusate sodium (COLACE) 100 MG capsule Take 1 capsule (100 mg total) by mouth daily. Patient not taking: Reported on 06/30/2017 05/12/17   Angiulli, Lavon Paganini, PA-C  furosemide (LASIX) 80 MG tablet Take 0.5 tablets (40 mg total) by mouth daily. 05/12/17   Angiulli, Lavon Paganini, PA-C  gabapentin (NEURONTIN) 300 MG capsule Take 1 capsule (300 mg total) by mouth at bedtime. Patient taking differently: Take 200 mg by mouth at bedtime.  06/06/17   Arnoldo Morale, MD  glucose blood (TRUE METRIX BLOOD GLUCOSE TEST) test strip Use as instructed 05/12/17   Angiulli, Lavon Paganini, PA-C  insulin glargine (LANTUS) 100 unit/mL SOPN Inject 0.1 mLs (10 Units total) into the skin at bedtime. 05/12/17   Angiulli, Lavon Paganini, PA-C  lactulose (CHRONULAC) 10 GM/15ML solution Take 15 mLs (10 g total) by mouth 3 (three) times daily. 06/06/17   Arnoldo Morale, MD  losartan (COZAAR) 25 MG tablet Take 0.5 tablets (12.5 mg total) by mouth daily. 06/02/17   Arnoldo Morale, MD  NOVOLOG 100 UNIT/ML injection Inject 5 Units into the skin at bedtime. 05/06/17   [provider]  pantoprazole (PROTONIX) 40 MG tablet Take 1 tablet (40 mg total) by mouth daily. 05/12/17   Angiulli, Lavon Paganini, PA-C  spironolactone (ALDACTONE) 12.5 mg TABS tablet Take 12.5 mg by mouth daily.    [provider]  traMADol (ULTRAM) 50 MG tablet Take 1 tablet (50 mg total) by mouth every 12 (twelve) hours as needed for severe pain. 06/02/17   Arnoldo Morale, MD  TRUEPLUS LANCETS 28G MISC 28 g by Does not apply route 4 (four) times daily. 04/23/17   Brayton Caves, PA-C  warfarin (COUMADIN) 7.5 MG tablet Take 1 tablet (7.5 mg total) by mouth daily at 6 PM. 05/12/17   Angiulli, Lavon Paganini, PA-C   Family History Family History  Problem Relation Age of Onset  . Hypertension Mother   . Hypertension Sister    Social History Social History   Tobacco Use  . Smoking status: Never Smoker  . Smokeless tobacco: Never Used    Substance Use Topics  . Alcohol use: No  . Drug use: No   Allergies   Patient has no known allergies.  Review of Systems Review of Systems  Constitutional: Positive for fatigue. Negative for chills and fever.  HENT: Negative for ear pain and sore throat.   Eyes: Negative for pain and visual disturbance.  Respiratory: Positive for shortness of breath. Negative for cough.   Cardiovascular: Negative for chest pain and palpitations.  Gastrointestinal: Negative for abdominal pain and vomiting.  Genitourinary: Negative for dysuria and hematuria.  Musculoskeletal: Negative for arthralgias and back pain.  Skin: Negative for color change and rash.  Neurological: Positive for weakness. Negative for seizures and syncope.  Psychiatric/Behavioral: Positive for confusion.  All other systems reviewed and are negative.  Physical Exam Updated Vital Signs BP 112/90   Pulse 87   Temp 97.7 F (36.5 C) (Oral)   Resp 12   Ht '5\' 10"'$  (1.778 m)   Wt 76.7 kg (169 lb)   SpO2 94%   BMI 24.25 kg/m   Physical Exam  Constitutional: He is oriented to person, place, and time. He appears well-developed and well-nourished.  HENT:  Head: Normocephalic and atraumatic.  Eyes: Conjunctivae and EOM are normal. Pupils are equal, round, and reactive to light.  Neck: Normal range of motion. Neck supple.  Cardiovascular: Normal rate and regular rhythm.  No murmur heard. Pulmonary/Chest: Effort normal. No respiratory distress. He has rales.  Abdominal: Soft. There is no tenderness.  Musculoskeletal: Normal range of motion. He exhibits no edema, tenderness or deformity.  Neurological: He is alert and oriented to person, place, and time.  Skin: Skin is warm and dry.  Psychiatric: He has a normal mood and affect.  Nursing note and vitals reviewed.  ED Treatments / Results  Labs (all labs ordered are listed, but only abnormal results are displayed) Labs Reviewed  BASIC METABOLIC PANEL - Abnormal; Notable for  the following components:      Result Value   Sodium 133 (*)    Chloride 100 (*)    Glucose, Bld 153 (*)    BUN 22 (*)    All other components within normal limits  CBC - Abnormal; Notable for the following components:   Hemoglobin 12.2 (*)    HCT 33.2 (*)    MCV 68.7 (*)    MCH 25.3 (*)    MCHC 36.7 (*)    All other components within normal limits  CBG MONITORING, ED - Abnormal; Notable for the following components:   Glucose-Capillary 149 (*)    All other components within normal limits  URINALYSIS, ROUTINE W REFLEX MICROSCOPIC  HEPATIC FUNCTION PANEL  I-STAT CG4 LACTIC ACID, ED  I-STAT TROPONIN, ED   EKG  EKG Interpretation None      Radiology Dg Foot Complete Right  Result Date: 06/30/2017 CLINICAL DATA:  Right foot pain.  Nonhealing ulcer. EXAM: RIGHT FOOT COMPLETE - 3+ VIEW COMPARISON:  04/22/2017 FINDINGS: No sign of osteomyelitis. No fracture or dislocation. Ordinary mild midfoot osteoarthritis. Regional arterial calcification is present. IMPRESSION: No sign of osteomyelitis.  No other acute bone or joint finding. Electronically Signed   By: Nelson Chimes M.D.   On: 06/30/2017 13:08   Procedures Procedures (including critical care time)  Medications Ordered in ED Medications - No data to display  Initial Impression / Assessment and Plan / ED Course  I have reviewed the triage vital signs and the nursing notes.  Pertinent labs & imaging results that were available during my care of the patient were reviewed by me and considered in my medical decision making (see chart for details).  [name] is a 71 y.o. male with PMH CHF, CVA, and CAD who presented generalized weakness, increased confusion and worsening shortness of breath.  On my initial assessment the patient is alert and oriented, he appears comfortable. Hemodynamically stable.   Labs studies and imaging ordered for further evaluation of the patient's mental status changes and SOB.   Labs: CBC without  leukocytosis, mild chronic anemia Hgb 12, normal plt 244, ammonia mildly elevated 39, troponin neg, lactic acid normal, BNP markedly elevated 2434 from patient's baseline 800  CXR: Hazy bibasilar opacities suspicious for pneumonia. Cardiomegaly with mild vascular congestion. CT head: No intracranial hemorrhage or CT  evidence of large acute infarct.  On reassessment the patient is sitting up but very somnolent and appears slightly confused.   Other possible causes for the patient's symptoms that were considered include infectious causes (UTI, pneumonia, meningitis) however that patient has been afebrile, tumor/abscess, toxic causes (hypoglycemia, renal failure, hepatic failure, toxic ingestion), other neurologic disorders (seizure, TIA, CVA), however based on history and physical these do not seem to be likley  At this time the patient has been afebrile, does not have a leukocytosis, does not have a cough and without urinary compliants and does not have any other signs or symptoms concerning for infections therefore will not start antibiotics at this time.   Discussed patient case with hospitalist for admission.   ABG ordered at request of admitting hospitalist. Patient to be admitted for CHF exacerbation and delirium with waxing and waning mental status. At this time not requiring any supplemental oxygen.     Final Clinical Impressions(s) / ED Diagnoses   Final diagnoses:  Acute on chronic congestive heart failure, unspecified heart failure type ([place])  Weakness  Transient alteration of awareness  Encephalopathy  Shortness of breath   ED Discharge Orders    None       Fenton Foy, MD 07/03/17 [place], [place], MD 07/04/17 [phone]

## 2017-06-30 NOTE — Progress Notes (Signed)
Patient ID: [name], male   DOB: Nov 12, 1945, 71 y.o.   MRN: [id number]     [name], is a 71 y.o. male  ZDG:[id number]  [id number]  DOB - Nov 09, 1945  Subjective:  Chief Complaint and HPI: [name] is a 71 y.o. male here today for weight loss and confusion.  Patient is brought in by his daughter.  Stroke ~2 months ago.  Still doing rehabilitation but not making a lot of progress.  11 pound weight loss in the last month.  Poor appetite.  Daughter has to make him eat/drink.  He falls asleep often even in the middle of conversations.  Last few days, he is breathing more shallow.  Denies CP or feeling SOB.  She also feels that his memory has worsened over the last 2 weeks.    Blood sugars 76-200  Home health comes out once a week to check INR.    ROS:   Constitutional:  No f/c, No night sweats, + weight loss. EENT:  No vision changes, No blurry vision, No hearing changes. No mouth, throat, or ear problems.  Respiratory: No cough, No SOB Cardiac: No CP, no palpitations GI:  No abd pain, No N/V/D. GU: No Urinary s/sx Musculoskeletal: No joint pain Neuro: No headache, no dizziness, no motor weakness.  Skin: No rash Endocrine:  No polydipsia. No polyuria.  Psych: Denies SI/HI  No problems updated.  ALLERGIES: No Known Allergies  PAST MEDICAL HISTORY: Past Medical History:  Diagnosis Date  . CAD (coronary artery disease) 2007   CABG 2007 in [place]  . Chronic combined systolic and diastolic heart failure ([place])    a. EF 20-25% by echo in 03/2017 with NST showing prior infarct but no ischemia.   . Diabetes mellitus without complication ([place])   . Hyperlipidemia   . Hypertension   . Pulmonary embolism ([place])   . Stroke [facility])     MEDICATIONS AT HOME: Prior to Admission medications   Medication Sig Start Date End Date Taking? Authorizing Provider  acetaminophen (TYLENOL) 325 MG tablet Take 2 tablets (650 mg total) by mouth every 4 (four) hours as needed for headache  or mild pain. 05/05/17  Yes Lyda Jester M, PA-C  aspirin EC 81 MG EC tablet Take 1 tablet (81 mg total) by mouth daily. 05/05/17  Yes Lyda Jester M, PA-C  atorvastatin (LIPITOR) 80 MG tablet Take 1 tablet (80 mg total) by mouth daily at 6 PM. 05/12/17  Yes Angiulli, Lavon Paganini, PA-C  furosemide (LASIX) 80 MG tablet Take 0.5 tablets (40 mg total) by mouth daily. 05/12/17  Yes Angiulli, Lavon Paganini, PA-C  gabapentin (NEURONTIN) 300 MG capsule Take 1 capsule (300 mg total) by mouth at bedtime. Patient taking differently: Take 200 mg by mouth at bedtime.  06/06/17  Yes Amao, Charlane Ferretti, MD  insulin glargine (LANTUS) 100 unit/mL SOPN Inject 0.1 mLs (10 Units total) into the skin at bedtime. 05/12/17  Yes Angiulli, Lavon Paganini, PA-C  lactulose (CHRONULAC) 10 GM/15ML solution Take 15 mLs (10 g total) by mouth 3 (three) times daily. 06/06/17  Yes Arnoldo Morale, MD  losartan (COZAAR) 25 MG tablet Take 0.5 tablets (12.5 mg total) by mouth daily. 06/02/17  Yes Amao, Charlane Ferretti, MD  NOVOLOG 100 UNIT/ML injection Inject 5 Units into the skin at bedtime. 05/06/17  Yes [provider]  pantoprazole (PROTONIX) 40 MG tablet Take 1 tablet (40 mg total) by mouth daily. 05/12/17  Yes Angiulli, Lavon Paganini, PA-C  spironolactone (ALDACTONE) 12.5 mg TABS tablet Take  12.5 mg by mouth daily.   Yes [provider]  traMADol (ULTRAM) 50 MG tablet Take 1 tablet (50 mg total) by mouth every 12 (twelve) hours as needed for severe pain. 06/02/17  Yes Arnoldo Morale, MD  warfarin (COUMADIN) 7.5 MG tablet Take 1 tablet (7.5 mg total) by mouth daily at 6 PM. 05/12/17  Yes Angiulli, Lavon Paganini, PA-C  Blood Glucose Monitoring Suppl (TRUE METRIX METER) DEVI 1 kit by Does not apply route 4 (four) times daily. 05/12/17   Angiulli, Lavon Paganini, PA-C  cephALEXin (KEFLEX) 500 MG capsule Take 1 capsule (500 mg total) by mouth 2 (two) times daily. Patient not taking: Reported on 06/30/2017 06/02/17   Arnoldo Morale, MD  docusate sodium  (COLACE) 100 MG capsule Take 1 capsule (100 mg total) by mouth daily. Patient not taking: Reported on 06/30/2017 05/12/17   Angiulli, Lavon Paganini, PA-C  glucose blood (TRUE METRIX BLOOD GLUCOSE TEST) test strip Use as instructed 05/12/17   Angiulli, Lavon Paganini, PA-C  TRUEPLUS LANCETS 28G MISC 28 g by Does not apply route 4 (four) times daily. 04/23/17   Brayton Caves, PA-C     Objective:  EXAM:   Vitals:   06/30/17 1442  BP: 93/62  Pulse: 90  Resp: 16  Temp: (!) 97.4 F (36.3 C)  TempSrc: Oral  SpO2: 98%  Weight: 169 lb 3.2 oz (76.7 kg)    General appearance : A&OX3. NAD. Non-toxic-appearing, but does appear listless, falls asleep at least 3 times while I'm in the room.  I am able to rouse him.  He is in a wheelchair.   HEENT: Atraumatic and Normocephalic.  PERRLA. EOM intact.  TM clear B. Mouth-MMM, post pharynx WNL w/o erythema, No PND. Neck: supple, no JVD. No cervical lymphadenopathy. No thyromegaly Chest/Lungs:  Breathing-non-labored, but shallow and at 18bpm. Good air entry bilaterally, breath sounds normal without rales, rhonchi, or wheezing  CVS: S1 S2 regular, no murmurs, gallops, rubs  Abdomen: Bowel sounds present, Non tender and not distended with no gaurding, rigidity or rebound. Extremities: Bilateral Lower Ext shows no edema, both legs are warm to touch with = pulse throughout Neurology:  CN II-XII grossly intact Skin:  No Rash  Data Review Lab Results  Component Value Date   HGBA1C 8.8 (H) 04/25/2017   HGBA1C 9.4 (H) 04/20/2017     Assessment & Plan   1. Weight loss Needs work-up.  H/o hyponatremia  2. Diabetes mellitus type 2 in nonobese [facility]) Uncontrolled but continue current regimen while working on diet choices and food intake - Glucose (CBG) - Urinalysis Dipstick  3. Altered mental status, unspecified altered mental status type I discussed the patient with Dr. Carmie Kanner and we decided to send him to the ED for more definitive and thorough work-up  than we are able to perform as an outpatient.  Also, he could likely benefit from IVF.    4.  Pyuria Send urine for culture   Patient have been counseled extensively about nutrition and exercise  Return in about 2 weeks (around 07/14/2017) for Dr Margarita Rana for hospital f/up visit.  The patient was given clear instructions to go to ER or return to medical center if symptoms don't improve, worsen or new problems develop. The patient verbalized understanding. The patient was told to call to get lab results if they haven't heard anything in the next week.     Freeman Caldron, PA-C [facility] [place], [place]   06/30/2017, 3:23  PM   

## 2017-06-30 NOTE — Patient Instructions (Signed)
Go to ED for work up of mental status changes, somnolence, dehydration, and weight loss

## 2017-06-30 NOTE — ED Triage Notes (Signed)
Per Pt's family, pt is coming from PCP with complaints of dehydration and altered mental status that has worsened over the last few weeks. Pt is alert to person, place, and time, but he seems to be confused at times with family. Reported to have lost 11 lbs in two weeks.

## 2017-06-30 NOTE — ED Notes (Signed)
MD at bedside. 

## 2017-06-30 NOTE — ED Notes (Signed)
Patient transported to CT 

## 2017-06-30 NOTE — ED Notes (Signed)
Patient ambulates at home with walker per family member;  Family reports patient has increased weakness today;

## 2017-07-01 ENCOUNTER — Other Ambulatory Visit: Payer: Self-pay

## 2017-07-01 ENCOUNTER — Telehealth: Payer: Self-pay | Admitting: Family Medicine

## 2017-07-01 DIAGNOSIS — I63511 Cerebral infarction due to unspecified occlusion or stenosis of right middle cerebral artery: Secondary | ICD-10-CM

## 2017-07-01 DIAGNOSIS — J189 Pneumonia, unspecified organism: Principal | ICD-10-CM | POA: Diagnosis present

## 2017-07-01 DIAGNOSIS — I10 Essential (primary) hypertension: Secondary | ICD-10-CM

## 2017-07-01 DIAGNOSIS — E1142 Type 2 diabetes mellitus with diabetic polyneuropathy: Secondary | ICD-10-CM

## 2017-07-01 LAB — PROTIME-INR
INR: 2.66
PROTHROMBIN TIME: 28.1 s — AB (ref 11.4–15.2)

## 2017-07-01 LAB — BASIC METABOLIC PANEL
Anion gap: 11 (ref 5–15)
BUN: 22 mg/dL — AB (ref 6–20)
CALCIUM: 9.1 mg/dL (ref 8.9–10.3)
CO2: 22 mmol/L (ref 22–32)
CREATININE: 1.05 mg/dL (ref 0.61–1.24)
Chloride: 100 mmol/L — ABNORMAL LOW (ref 101–111)
GFR calc Af Amer: >60 mL/min (ref >60)
GFR calc non Af Amer: >60 mL/min (ref >60)
GLUCOSE: 168 mg/dL — AB (ref 65–99)
Potassium: 3.8 mmol/L (ref 3.5–5.1)
SODIUM: 133 mmol/L — AB (ref 135–145)

## 2017-07-01 LAB — PROCALCITONIN: Procalcitonin: <0.1 ng/mL

## 2017-07-01 LAB — CBG MONITORING, ED
GLUCOSE-CAPILLARY: 131 mg/dL — AB (ref 65–99)
Glucose-Capillary: 152 mg/dL — ABNORMAL HIGH (ref 65–99)

## 2017-07-01 LAB — GLUCOSE, CAPILLARY
GLUCOSE-CAPILLARY: 139 mg/dL — AB (ref 65–99)
Glucose-Capillary: 141 mg/dL — ABNORMAL HIGH (ref 65–99)

## 2017-07-01 LAB — HIV ANTIBODY (ROUTINE TESTING W REFLEX): HIV Screen 4th Generation wRfx: NONREACTIVE

## 2017-07-01 LAB — STREP PNEUMONIAE URINARY ANTIGEN: Strep Pneumo Urinary Antigen: NEGATIVE

## 2017-07-01 MED ORDER — PANTOPRAZOLE SODIUM 40 MG PO TBEC
40.0000 mg | DELAYED_RELEASE_TABLET | Freq: Every day | ORAL | Status: DC
  Administered 2017-07-02 – 2017-07-15 (×14): 40 mg via ORAL
  Filled 2017-07-01 (×17): qty 1

## 2017-07-01 MED ORDER — ENSURE ENLIVE PO LIQD
237.0000 mL | Freq: Two times a day (BID) | ORAL | Status: DC
  Administered 2017-07-02 – 2017-07-14 (×23): 237 mL via ORAL

## 2017-07-01 MED ORDER — TRAMADOL HCL 50 MG PO TABS
50.0000 mg | ORAL_TABLET | Freq: Two times a day (BID) | ORAL | Status: DC | PRN
  Administered 2017-07-01 – 2017-07-03 (×3): 50 mg via ORAL
  Filled 2017-07-01 (×3): qty 1

## 2017-07-01 MED ORDER — WARFARIN SODIUM 7.5 MG PO TABS
7.5000 mg | ORAL_TABLET | Freq: Every day | ORAL | Status: DC
  Administered 2017-07-01: 7.5 mg via ORAL
  Filled 2017-07-01 (×2): qty 1

## 2017-07-01 MED ORDER — ATORVASTATIN CALCIUM 80 MG PO TABS
80.0000 mg | ORAL_TABLET | Freq: Every day | ORAL | Status: DC
  Administered 2017-07-01 – 2017-07-15 (×15): 80 mg via ORAL
  Filled 2017-07-01 (×16): qty 1

## 2017-07-01 MED ORDER — INSULIN ASPART 100 UNIT/ML ~~LOC~~ SOLN
0.0000 [IU] | Freq: Three times a day (TID) | SUBCUTANEOUS | Status: DC
  Administered 2017-07-01 (×2): 1 [IU] via SUBCUTANEOUS
  Administered 2017-07-02 – 2017-07-03 (×3): 3 [IU] via SUBCUTANEOUS
  Administered 2017-07-03 (×2): 2 [IU] via SUBCUTANEOUS
  Administered 2017-07-04 (×2): 3 [IU] via SUBCUTANEOUS
  Administered 2017-07-04: 5 [IU] via SUBCUTANEOUS
  Filled 2017-07-01: qty 1

## 2017-07-01 MED ORDER — DEXTROSE 5 % IV SOLN
1.0000 g | Freq: Every day | INTRAVENOUS | Status: DC
  Administered 2017-07-01: 1 g via INTRAVENOUS
  Filled 2017-07-01: qty 10

## 2017-07-01 MED ORDER — LOSARTAN POTASSIUM 25 MG PO TABS
12.5000 mg | ORAL_TABLET | Freq: Every day | ORAL | Status: DC
  Administered 2017-07-02 – 2017-07-04 (×3): 12.5 mg via ORAL
  Filled 2017-07-01 (×5): qty 0.5

## 2017-07-01 MED ORDER — AZITHROMYCIN 250 MG PO TABS
500.0000 mg | ORAL_TABLET | Freq: Every day | ORAL | Status: DC
  Administered 2017-07-01: 500 mg via ORAL
  Filled 2017-07-01: qty 2

## 2017-07-01 MED ORDER — LACTULOSE 10 GM/15ML PO SOLN
10.0000 g | Freq: Two times a day (BID) | ORAL | Status: DC
  Administered 2017-07-01 – 2017-07-03 (×5): 10 g via ORAL
  Filled 2017-07-01 (×7): qty 15

## 2017-07-01 MED ORDER — DEXTROSE 5 % IV SOLN
1.0000 g | INTRAVENOUS | Status: DC
  Administered 2017-07-01 – 2017-07-02 (×2): 1 g via INTRAVENOUS
  Filled 2017-07-01 (×2): qty 10

## 2017-07-01 MED ORDER — SPIRONOLACTONE 25 MG PO TABS
12.5000 mg | ORAL_TABLET | Freq: Every day | ORAL | Status: DC
  Administered 2017-07-02 – 2017-07-04 (×3): 12.5 mg via ORAL
  Filled 2017-07-01: qty 0.5
  Filled 2017-07-01 (×4): qty 1

## 2017-07-01 MED ORDER — ASPIRIN EC 81 MG PO TBEC
81.0000 mg | DELAYED_RELEASE_TABLET | Freq: Every day | ORAL | Status: DC
  Administered 2017-07-02 – 2017-07-15 (×14): 81 mg via ORAL
  Filled 2017-07-01 (×17): qty 1

## 2017-07-01 MED ORDER — FUROSEMIDE 10 MG/ML IJ SOLN
40.0000 mg | Freq: Every day | INTRAMUSCULAR | Status: DC
Start: 2017-07-01 — End: 2017-07-04
  Administered 2017-07-01 – 2017-07-03 (×3): 40 mg via INTRAVENOUS
  Filled 2017-07-01 (×4): qty 4

## 2017-07-01 MED ORDER — ACETAMINOPHEN 325 MG PO TABS
650.0000 mg | ORAL_TABLET | ORAL | Status: DC | PRN
  Administered 2017-07-05 – 2017-07-10 (×4): 650 mg via ORAL
  Filled 2017-07-01 (×5): qty 2

## 2017-07-01 MED ORDER — FUROSEMIDE 20 MG PO TABS: 40.0000 mg | ORAL_TABLET | Freq: Every day | ORAL | Status: DC

## 2017-07-01 MED ORDER — WARFARIN - PHARMACIST DOSING INPATIENT
Freq: Every day | Status: DC
  Administered 2017-07-02 – 2017-07-10 (×5)

## 2017-07-01 MED ORDER — INSULIN GLARGINE 100 UNIT/ML ~~LOC~~ SOLN
6.0000 [IU] | Freq: Every day | SUBCUTANEOUS | Status: DC
  Administered 2017-07-01 – 2017-07-03 (×3): 6 [IU] via SUBCUTANEOUS
  Filled 2017-07-01 (×4): qty 0.06

## 2017-07-01 MED ORDER — AZITHROMYCIN 500 MG PO TABS
500.0000 mg | ORAL_TABLET | Freq: Every day | ORAL | Status: AC
  Administered 2017-07-01: 500 mg via ORAL
  Filled 2017-07-01: qty 1

## 2017-07-01 MED ORDER — GABAPENTIN 100 MG PO CAPS
200.0000 mg | ORAL_CAPSULE | Freq: Every day | ORAL | Status: DC
  Administered 2017-07-01: 200 mg via ORAL
  Filled 2017-07-01: qty 2

## 2017-07-01 MED ORDER — AZITHROMYCIN 500 MG PO TABS
250.0000 mg | ORAL_TABLET | Freq: Every day | ORAL | Status: DC
  Administered 2017-07-02 – 2017-07-03 (×2): 250 mg via ORAL
  Filled 2017-07-01 (×2): qty 1

## 2017-07-01 NOTE — Telephone Encounter (Signed)
[facility]([place]) called to ask the provider if she can prescribe a stimulant for appetite since pt has lost 7 pound, please call her back if you can auth or not  At  [phone]

## 2017-07-01 NOTE — ED Notes (Signed)
[facility]. Interpreter via phone. Patient refused PO medications.

## 2017-07-01 NOTE — Progress Notes (Signed)
Pharmacy Antibiotic Note  [name] is a 71 y.o. male admitted on 06/30/2017 with pneumonia.  Pharmacy has been consulted for ceftriaxone dosing. Received 1x doses of ceftriaxone/azithro overnight in the ED.  Plan: Ceftriaxone 1g IV q24h Azithro PO per MD F/u clinical progress, LOT Not renally adjusted - Rx will s/o consult   Height: 5\' 10"  (177.8 cm) Weight: 169 lb (76.7 kg) IBW/kg (Calculated) : 73  Temp (24hrs), Avg:97.6 F (36.4 C), Min:97.4 F (36.3 C), Max:97.7 F (36.5 C)  Recent Labs  Lab 06/30/17 1652 06/30/17 1952 06/30/17 2232 07/01/17 0328  WBC 6.3  --   --   --   CREATININE 1.04  --   --  1.05  LATICACIDVEN  --  1.53 1.34  --     Estimated Creatinine Clearance: 67.6 mL/min (by C-G formula based on SCr of 1.05 mg/dL).    No Known Allergies  Babs BertinHaley Mahlon Gabrielle, PharmD, BCPS Clinical Pharmacist 07/01/2017 10:56 AM

## 2017-07-01 NOTE — ED Notes (Signed)
CBG 152  

## 2017-07-01 NOTE — Progress Notes (Signed)
[name], [name] ([id number]) Visit Report for 06/30/2017 Chief Complaint Document Details Patient Name: [name], [name] Date of Service: 06/30/2017 9:45 AM Medical Record Number: [id number] Patient Account Number: [id number] Date of Birth/Sex: [birth year] (71 y.o. Male) Treating RN: Ashok Cordia, Debi Primary Care Provider: Jaclyn Shaggy Other Clinician: Referring Provider: Jaclyn Shaggy Treating Provider/Extender: Rudene Re in Treatment: 2 Information Obtained from: Patient Chief Complaint Patient presents to the wound care center today with an open arterial ulcer to the right third toe which has been there for about 2 weeks Electronic Signature(s) Signed: 06/30/2017 10:29:27 AM By: Evlyn Kanner MD, FACS Entered By: Evlyn Kanner on 06/30/2017 10:29:27 [name] ([id number]) -------------------------------------------------------------------------------- HPI Details Patient Name: [name] Date of Service: 06/30/2017 9:45 AM Medical Record Number: [id number] Patient Account Number: [id number] Date of Birth/Sex: 10/15/1945 (71 y.o. Male) Treating RN: Ashok Cordia, Debi Primary Care Provider: Jaclyn Shaggy Other Clinician: Referring Provider: Jaclyn Shaggy Treating Provider/Extender: Rudene Re in Treatment: 2 History of Present Illness Location: right third toe Quality: Patient reports experiencing a sharp pain to affected area(s). Severity: Patient states wound are getting worse. Duration: Patient has had the wound for < 2 weeks prior to presenting for treatment Timing: Pain in wound is Intermittent (comes and goes Context: The wound would happen gradually Modifying Factors: Other treatment(s) tried include:had arterial surgery recently Associated Signs and Symptoms: Patient reports having increase discharge. HPI Description: 71 year old diabetic who has recently been reviewed by Dr. Leonides Sake for a postop follow-up for a right femoropopliteal artery, right BK  popliteal with BPA, and anastomosis to the right PT and AT. 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 The patient is having a third toe ulcer and there is some distal foot issues. the patient was recommended to have a duplex study in 2 months and at that time would be reevaluated. The patient was referred to our center for wound care. Past medical history significant for type 2 diabetes mellitus last hemoglobin A1c was 8.8%, previous CVA, coronary artery disease status post 2 vessel CABG in 2007, CHF,hyperlipidemia, hypertension, pulmonary embolus. The last ABI study done 04/27/2017 showed a right ABI of 0.55 and a left ABI of 0.94 with monophasic flow on the right and biphasic flow on the left 06/30/2017 -- the last x-ray of the right foot done on 04/22/2017 -- IMPRESSION: Tiny avulsion along the lateral aspect of the distal portion of the fifth proximal phalanx. No other fracture. No dislocation. 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 No erosive change. Areas of arterial vascular calcification most likely are due to diabetes mellitus. Small calcaneal spurs noted. The dry areaoeschar has increased over the right third toe and we will repeat an x-ray of the foot. Electronic Signature(s) Signed: 06/30/2017 10:29:52 AM By: Evlyn Kanner MD, FACS Previous Signature: 06/30/2017 10:28:45 AM Version By: Evlyn Kanner MD, FACS Entered By: Evlyn Kanner on 06/30/2017 10:29:51 [name] ([id number]) -------------------------------------------------------------------------------- Physical Exam Details Patient Name: [name] Date of Service: 06/30/2017 9:45 AM Medical Record Number: [id number] Patient Account Number: [id number] Date of Birth/Sex: Dec 29, 1945 (71 y.o. Male) Treating RN: Ashok Cordia, Debi Primary Care Provider: Jaclyn Shaggy Other Clinician: Referring Provider: Jaclyn Shaggy Treating  Provider/Extender: Rudene Re in Treatment: 2 Constitutional . Pulse regular. Respirations normal and unlabored. Afebrile. . Eyes Nonicteric. Reactive to light. Ears, Nose, Mouth, and Throat Lips, teeth, and gums WNL.Marland Kitchen Moist mucosa without lesions. Neck supple and nontender. No palpable supraclavicular or cervical adenopathy. Normal sized  without goiter. Respiratory WNL. No retractions.. Cardiovascular Pedal Pulses WNL. No clubbing, cyanosis or edema. Lymphatic No adneopathy. No adenopathy. No adenopathy. Musculoskeletal Adexa without tenderness or enlargement.. Digits and nails w/o clubbing, cyanosis, infection, petechiae, ischemia, or inflammatory conditions.. Integumentary (Hair, Skin) No suspicious lesions. No crepitus or fluctuance. No peri-wound warmth or erythema. No masses.Marland Kitchen Psychiatric Judgement and insight Intact.. No evidence of depression, anxiety, or agitation.. Notes he has had good resolution of the maceration between all his toes and the right third toe continues to be very tender and there is a dry eschar which does not separate. Electronic Signature(s) Signed: 06/30/2017 10:30:37 AM By: Evlyn Kanner MD, FACS Entered By: Evlyn Kanner on 06/30/2017 10:30:36 [name] ([id number]) -------------------------------------------------------------------------------- Physician Orders Details Patient Name: [name] Date of Service: 06/30/2017 9:45 AM Medical Record Number: [id number] Patient Account Number: [id number] Date of Birth/Sex: [birth year] (71 y.o. Male) Treating RN: Ashok Cordia, Debi Primary Care Provider: Jaclyn Shaggy Other Clinician: Referring Provider: Jaclyn Shaggy Treating Provider/Extender: Rudene Re in Treatment: 2 Verbal / Phone Orders: Yes Clinician: Pinkerton, Debi Read Back and Verified: Yes Diagnosis Coding Wound Cleansing Wound #1 Right Toe Third o Clean wound with Normal Saline. o Cleanse wound with mild soap  and water o May Shower, gently pat wound dry prior to applying new dressing. o No tub bath. Anesthetic Wound #1 Right Toe Third o Topical Lidocaine 4% cream applied to wound bed prior to debridement Primary Wound Dressing Wound #1 Right Toe Third o Other: - Silvercell rope Secondary Dressing Wound #1 Right Toe Third o Conform/Kerlix - stretch net Dressing Change Frequency Wound #1 Right Toe Third o Change dressing every other day. Follow-up Appointments Wound #1 Right Toe Third o Return Appointment in 1 week. Radiology o X-ray, foot - right Electronic Signature(s) Signed: 06/30/2017 4:30:10 PM By: Evlyn Kanner MD, FACS Signed: 06/30/2017 5:35:20 PM By: Alejandro Mulling Entered By: Alejandro Mulling on 06/30/2017 10:25:36 [name] ([id number]) -------------------------------------------------------------------------------- Problem List Details Patient Name: [name] Date of Service: 06/30/2017 9:45 AM Medical Record Number: [id number] Patient Account Number: [id number] Date of Birth/Sex: 23-Nov-1945 (71 y.o. 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 Male) Treating RN: Ashok Cordia, Debi Primary Care Provider: Jaclyn Shaggy Other Clinician: Referring Provider: Jaclyn Shaggy Treating Provider/Extender: Rudene Re in Treatment: 2 Subjective Chief Complaint Information obtained from Patient Patient presents to the wound care center today with an open arterial ulcer to the right third toe which has been there for about 2 weeks History of Present Illness (HPI) The following HPI elements were documented for the patient's wound: Location: right third toe Quality: Patient reports experiencing a sharp pain to affected area(s). Severity: Patient states wound are getting worse. Duration: Patient has had the wound for < 2 weeks prior to presenting for treatment Timing: Pain in wound is Intermittent (comes and goes Context: The wound  would happen gradually Modifying Factors: Other treatment(s) tried include:had arterial surgery recently Associated Signs and Symptoms: Patient reports having increase discharge. 71 year old diabetic who has recently been reviewed by Dr. Leonides SakeBrian Chen for a postop follow-up for a right femoropopliteal artery, right BK popliteal with BPA, and anastomosis to the right PT and AT. 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 The patient is having a third toe ulcer and there is some distal foot issues. the patient was recommended to have a duplex study in 2 months and at that time would be reevaluated. The patient was referred to our center for wound care. Past medical history significant for type 2 diabetes mellitus last hemoglobin A1c was 8.8%, previous CVA, coronary artery disease status post 2 vessel CABG in 2007, CHF,hyperlipidemia, hypertension, pulmonary embolus. The last ABI study done 04/27/2017 showed a right ABI of 0.55 and a left ABI of 0.94  with monophasic flow on the right and biphasic flow on the left 06/30/2017 -- the last x-ray of the right foot done on 04/22/2017 -- IMPRESSION: Tiny avulsion along the lateral aspect of the distal portion of the fifth proximal phalanx. No other fracture. No dislocation. 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 No erosive change. Areas of arterial vascular calcification most likely are due to diabetes mellitus. Small calcaneal spurs noted. The dry area eschar has increased over the right third toe and we will repeat an x-ray of the foot. Patient History Information obtained from Patient. Social History Never smoker, Marital Status - Married, Alcohol Use - Never, Drug Use - No History, Caffeine Use - Daily. Medical History Hospitalization/Surgery History - 04/28/2017, [place], [place]. [place], [name] ([id number]) Objective Constitutional Pulse regular. Respirations normal and unlabored. Afebrile. Vitals Time Taken: 10:06 AM, Height: 70 in, Weight: 177 lbs, BMI: 25.4, Temperature: 98.0 F, Pulse: 78 bpm, Respiratory Rate: 18 breaths/min, Blood Pressure: 155/92 mmHg. Eyes Nonicteric. Reactive to light. Ears, Nose, Mouth, and Throat Lips, teeth, and gums WNL.Marland Kitchen. Moist mucosa without lesions. Neck supple and nontender. No palpable supraclavicular or cervical adenopathy. Normal sized without goiter. Respiratory WNL. No retractions.. Cardiovascular Pedal Pulses WNL. No clubbing, cyanosis or edema. Lymphatic No adneopathy. No adenopathy. No adenopathy. Musculoskeletal Adexa without tenderness or enlargement.. Digits and nails w/o clubbing, cyanosis, infection, petechiae, ischemia, or inflammatory conditions.Marland Kitchen. Psychiatric Judgement and insight Intact.. No evidence of depression, anxiety, or agitation.. General Notes: he has had good resolution of the maceration between all his toes and the right third toe continues to be very tender and there is a dry eschar which does  not separate. Integumentary (Hair, Skin) No suspicious lesions. No crepitus or fluctuance. No peri-wound warmth or erythema. No masses.. Wound #1 status is Open. Original cause of wound was Trauma. The wound is located on the Right Toe Third. The wound measures 0.1cm length x 0.1cm width x 0.1cm depth; 0.008cm^2 area and 0.001cm^3 volume. The wound is limited to skin breakdown. There is no tunneling or undermining noted. There is a medium amount of serous drainage noted. The wound margin is indistinct and nonvisible. There is no granulation within the wound bed. There is a large (67-100%) amount of necrotic tissue within the wound bed including Eschar. [place], Devin ([id number]) Assessment Active Problems ICD-10 E11.621 - Type 2 diabetes mellitus with foot ulcer I73.9 - Peripheral vascular disease, unspecified L97.512 - Non-pressure chronic ulcer of other part of right foot with fat layer exposed Plan Wound Cleansing: Wound #1 Right  Toe Third: Clean wound with Normal Saline. Cleanse wound with mild soap and water May Shower, gently pat wound dry prior to applying new dressing. No tub bath. Anesthetic: Wound #1 Right Toe Third: Topical Lidocaine 4% cream applied to wound bed prior to debridement Primary Wound Dressing: Wound #1 Right Toe Third: Other: - Silvercell rope Secondary Dressing: Wound #1 Right Toe Third: Conform/Kerlix - stretch net Dressing Change Frequency: Wound #1 Right Toe Third: Change dressing every other day. Follow-up Appointments: Wound #1 Right Toe Third: Return Appointment in 1 week. Radiology ordered were: X-ray, foot - right After review and discussing with the daughter, who is the caregiver and translates from the [place] language, I have recommended: 1. Silver alginate to be placed between his toes with special emphasis on the right third toe. 2. x-ray of the right foot 3. adequate control of his diabetes mellitus as treated and managed by his PCP 4.  adequate protein, vitamin A, vitamin C and zinc 5. Regular visits to the wound center Electronic Signature(s) Signed: 06/30/2017 10:31:31 AM By: Evlyn KannerBritto, Zhamir Pirro MD, FACS [place], Devin ([id number]) Entered By: Evlyn KannerBritto, Zailyn Thoennes on 06/30/2017 10:31:30 Devin RhymeSANGARE, Devin ([id number]) -------------------------------------------------------------------------------- ROS/PFSH Details Patient Name: Devin RhymeSANGARE, [name] Date of Service: 06/30/2017 9:45 AM Medical Record Number: [id number] Patient Account Number: [id number] Date of Birth/Sex: 03-31-1946 (71 y.o. Male) Treating RN: Ashok CordiaPinkerton, Debi Primary Care Provider: Jaclyn ShaggyAMAO, ENOBONG Other Clinician: Referring Provider: Jaclyn ShaggyAMAO, ENOBONG Treating Provider/Extender: Rudene ReBritto, [place] Passow Weeks in Treatment: 2 Information Obtained From Patient Wound History Do you currently have one or more open woundso Yes How many open wounds do you currently haveo 1 Approximately how long have you had your woundso months How have you been treating your wound(s) until nowo Antibiotics Has your wound(s) ever healed and then re-openedo No Have you had any lab work done in the past montho Yes Have you tested positive for an antibiotic resistant organism (MRSA, VRE)o No Have you had any tests for circulation on your legso Yes Eyes Medical History: Negative for: Cataracts; Glaucoma; Optic Neuritis Ear/Nose/Mouth/Throat Medical History: Negative for: Chronic sinus problems/congestion; Middle ear problems Hematologic/Lymphatic Medical History: Negative for: Anemia; Hemophilia; Human Immunodeficiency Virus; Lymphedema; Sickle Cell Disease Respiratory Medical History: Negative for: Aspiration; Asthma; Chronic Obstructive Pulmonary Disease (COPD); Pneumothorax; Sleep Apnea; Tuberculosis Cardiovascular Medical History: Positive for: Congestive Heart Failure; Coronary Artery Disease; Deep Vein Thrombosis; Hypertension - controlled with meds; Peripheral Arterial Disease Negative for: Angina;  Arrhythmia; Hypotension; Myocardial Infarction; Peripheral Venous Disease; Phlebitis; Vasculitis Gastrointestinal Medical History: Negative for: Cirrhosis ; Colitis; Crohnos; Hepatitis A; Hepatitis B; Hepatitis C Endocrine Medical History: Positive for: Type II Diabetes Negative for: Type I Diabetes Devin OdorSANGARE, Sevon ([id number]) Time with diabetes: 10 years Treated with: Insulin Blood sugar tested every day: Yes Tested : 2-3 Blood sugar testing results: Breakfast: 131; Bedtime: 230 Genitourinary Medical History: Negative for: End Stage Renal Disease Immunological Medical History: Negative for: Lupus Erythematosus; Raynaudos; Scleroderma Integumentary (Skin) Medical History: Negative for: History of Burn; History of pressure wounds Musculoskeletal Medical History: Negative for: Gout; Rheumatoid Arthritis; Osteoarthritis; Osteomyelitis Neurologic Medical History: Positive for: Neuropathy Negative for: Dementia; Quadriplegia; Paraplegia; Seizure Disorder Oncologic Medical History: Negative for: Received Chemotherapy; Received Radiation Psychiatric Medical History: Negative for: Anorexia/bulimia; Confinement Anxiety Immunizations Pneumococcal Vaccine: Received Pneumococcal Vaccination: Yes Implantable Devices Hospitalization / Surgery History Name of Hospital Purpose of Hospitalization/Surgery Date [place] SOB 04/28/2017 Family and Social History Never smoker; Marital Status - Married; Alcohol Use: Never; Drug Use: No History; Caffeine Use: Daily; Advanced Directives: No; Patient does not want  information on Advanced Directives; Do not resuscitate: No; Living Will: No; Medical Power of Attorney: No Physician Mansel Strother, [place] ([id number]) I have reviewed and agree with the above information. Electronic Signature(s) Signed: 06/30/2017 4:30:10 PM By: Evlyn Kanner MD, FACS Signed: 06/30/2017 5:35:20 PM By: Alejandro Mulling Entered By: Evlyn Kanner on 06/30/2017  10:30:02 [name] ([id number]) -------------------------------------------------------------------------------- SuperBill Details Patient Name: [name] Date of Service: 06/30/2017 Medical Record Number: [id number] Patient Account Number: [id number] Date of Birth/Sex: 07-03-1946 (71 y.o. Male) Treating RN: Ashok Cordia, Debi Primary Care Provider: Jaclyn Shaggy Other Clinician: Referring Provider: Jaclyn Shaggy Treating Provider/Extender: Rudene Re in Treatment: 2 Diagnosis Coding ICD-10 Codes Code Description E11.621 Type 2 diabetes mellitus with foot ulcer I73.9 Peripheral vascular disease, unspecified L97.512 Non-pressure chronic ulcer of other part of right foot with fat layer exposed Physician Procedures CPT4 Code: [id number] Description: 99213 - WC PHYS LEVEL 3 - EST PT ICD-10 Diagnosis Description E11.621 Type 2 diabetes mellitus with foot ulcer I73.9 Peripheral vascular disease, unspecified L97.512 Non-pressure chronic ulcer of other part of right foot with fat Modifier: layer exposed Quantity: 1 Electronic Signature(s) Signed: 06/30/2017 10:31:45 AM By: Evlyn Kanner MD, FACS Entered By: Evlyn Kanner on 06/30/2017 10:31:45

## 2017-07-01 NOTE — Telephone Encounter (Signed)
He was hospitalized yesterday.

## 2017-07-01 NOTE — Progress Notes (Signed)
   Follow Up Note  HPI: Please refer to complete H&P for details  Pt admitted earlier this morning due to worsening dyspnea on exertion and acute metabolic encephalopathy of unknown cause.  Patient still noted to be sleepy/lethargic, easily arousable, responds appropriately to questions.  Daughter noted intermittent confusion.  Patient is admitted for further workup  Exam: CV: S1-S2 present, no added heart sounds Lungs: Mild bibasilar crackles noted bilaterally Abd: Soft, nontender, nondistended, bowel sounds present Ext: No pedal edema noted bilaterally, right second toe with nondraining ulcer Neuro: No focal neurologic deficit  Present on Admission: . Acute on chronic systolic heart failure ([name]) . Right middle cerebral artery stroke ([name]) . Type 2 diabetes mellitus with peripheral neuropathy ([name]) . CAP (community acquired pneumonia) . Benign essential HTN   Plan: #Acute metabolic encephalopathy Unknown etiology Chest x-ray showing possible infiltration, will treat as CAP for now UA small WBC, urine culture pending Blood culture pending Lactic acid within normal limits Held patient Neurontin Continue IV Rocephin and azithromycin Closely monitor PT OT once stable  #Likely acute on chronic systolic CHF Trope negative, EKG no acute ST changes Continue IV diuresis, losartan Daily BMP

## 2017-07-01 NOTE — ED Notes (Signed)
Dr. Jared at bedside. 

## 2017-07-01 NOTE — ED Notes (Signed)
  CBG 131  

## 2017-07-01 NOTE — ED Notes (Signed)
Patient instructed via [place] interpreter via phone that when he finishes BM, the tissues are on the bed for him to be able to wipe/clean, and the call bell is beside it. Patient verbalizes understanding.

## 2017-07-01 NOTE — Progress Notes (Signed)
ANTICOAGULATION CONSULT NOTE - Initial Consult  Pharmacy Consult for Coumadin Indication: h/o CVA  No Known Allergies  Patient Measurements: Height: _0  (177.8 cm) Weight: 169 lb (76.7 kg) IBW/kg (Calculated) : 73  Vital Signs: BP: 95/72 (11/13 0400) Pulse Rate: 90 (11/13 0400)  Labs: Recent Labs    06/30/17 1652 07/01/17 0231  HGB 12.2*  --   HCT 33.2*  --   PLT 244  --   LABPROT  --  28.1*  INR  --  2.66  CREATININE 1.04  --     Estimated Creatinine Clearance: 68.2 mL/min (by C-G formula based on SCr of 1.04 mg/dL).   Medical History: Past Medical History:  Diagnosis Date  . CAD (coronary artery disease) 2007   CABG 2007 in [place]  . Chronic combined systolic and diastolic heart failure ([place])    a. EF 20-25% by echo in 03/2017 with NST showing prior infarct but no ischemia.   . Diabetes mellitus without complication ([place])   . Hyperlipidemia   . Hypertension   . Pulmonary embolism ([place])   . Stroke [facility])     Medications:  No current facility-administered medications on file prior to encounter.    Current Outpatient Medications on File Prior to Encounter  Medication Sig Dispense Refill  . acetaminophen (TYLENOL) 325 MG tablet Take 2 tablets (650 mg total) by mouth every 4 (four) hours as needed for headache or mild pain.    Marland Kitchen aspirin EC 81 MG EC tablet Take 1 tablet (81 mg total) by mouth daily.    Marland Kitchen atorvastatin (LIPITOR) 80 MG tablet Take 1 tablet (80 mg total) by mouth daily at 6 PM. 30 tablet 11  . furosemide (LASIX) 80 MG tablet Take 0.5 tablets (40 mg total) by mouth daily. 30 tablet 5  . gabapentin (NEURONTIN) 300 MG capsule Take 1 capsule (300 mg total) by mouth at bedtime. (Patient taking differently: Take 200 mg by mouth at bedtime. ) 30 capsule 3  . insulin glargine (LANTUS) 100 unit/mL SOPN Inject 0.1 mLs (10 Units total) into the skin at bedtime. 15 mL 11  . lactulose (CHRONULAC) 10 GM/15ML solution Take 15 mLs (10 g total) by mouth 3 (three)  times daily. (Patient taking differently: Take 10 g 2 (two) times daily by mouth. ) 946 mL 1  . losartan (COZAAR) 25 MG tablet Take 0.5 tablets (12.5 mg total) by mouth daily. 30 tablet 3  . NOVOLOG 100 UNIT/ML injection Inject 5 Units into the skin at bedtime.  11  . pantoprazole (PROTONIX) 40 MG tablet Take 1 tablet (40 mg total) by mouth daily. 30 tablet 5  . spironolactone (ALDACTONE) 12.5 mg TABS tablet Take 12.5 mg by mouth daily.    . traMADol (ULTRAM) 50 MG tablet Take 1 tablet (50 mg total) by mouth every 12 (twelve) hours as needed for severe pain. 60 tablet 1  . warfarin (COUMADIN) 7.5 MG tablet Take 1 tablet (7.5 mg total) by mouth daily at 6 PM. 30 tablet 1  . Blood Glucose Monitoring Suppl (TRUE METRIX METER) DEVI 1 kit by Does not apply route 4 (four) times daily. 1 Device 0  . glucose blood (TRUE METRIX BLOOD GLUCOSE TEST) test strip Use as instructed 100 each 12  . TRUEPLUS LANCETS 28G MISC 28 g by Does not apply route 4 (four) times daily. 120 each 11     Assessment: 71 y.o. male admitted with AMS, h/o CVA, to continue Coumadin Goal of Therapy:  INR 2-3 Monitor  platelets by anticoagulation protocol: Yes   Plan:  Continue home Coumadin regimen Daily INR  [name], Bronson Curb 07/01/2017,4:51 AM

## 2017-07-01 NOTE — Progress Notes (Signed)
[name] [id number] Admission Data: 07/01/2017 6:58 PM Attending Provider: Briant CedarEzenduka, Nkeiruka J, MD  WUJ:[id number]:Amao, Odette HornsEnobong, MD Consults/ Treatment Team:   [name] is a 71 y.o. male patient admitted from ED awake, alert  & orientated  X 3,  Full Code, VSS - Blood pressure 100/73, pulse 96, temperature 97.7 F (36.5 C), temperature source Oral, resp. rate 16, height 5\' 10"  (1.778 m), weight 76.7 kg (169 lb), SpO2 100 %.,  , no c/o shortness of breath, no c/o chest pain, no distress noted. Tele # 19 placed and pt is currently running:normal sinus rhythm.   IV site WDL:  antecubital right, condition patent and no redness with a transparent dsg that's clean dry and intact.  Allergies:  No Known Allergies   Past Medical History:  Diagnosis Date  . CAD (coronary artery disease) 2007   [place]ABG 2007 in [place]  . Chronic combined systolic and diastolic heart failure ([name])    a. EF 20-25% by echo in 03/2017 with NST showing prior infarct but no ischemia.   . Diabetes mellitus without complication ([name])   . Hyperlipidemia   . Hypertension   . Pulmonary embolism ([name])   . Stroke [facility]([name])     History:  obtained from chart review. Tobacco/alcohol: denied none  Pt orientation to unit, room and routine. Information packet given to patient/family and safety video watched.  Admission INP armband ID verified with patient/family, and in place. SR up x 2, fall risk assessment complete with Patient and family verbalizing understanding of risks associated with falls. Pt verbalizes an understanding of how to use the call bell and to call for help before getting out of bed.  Skin, clean-dry- intact without evidence of bruising, or skin tears.   No evidence of skin break down noted on exam. no rashes, no ecchymoses, no petechiae, no nodules, no jaundice, no purpura, no wounds, scar - lower leg(s) bilateral  Third right toe necrotic. Patient has been followed by the wound center.     Will cont to monitor and  assist as needed.  Camillo FlamingVicki L Malarie Tappen, RN 07/01/2017 6:58 PM

## 2017-07-01 NOTE — ED Notes (Signed)
Assessment via interpreter phone - [place]. Patient sitting on side of bed. Will not lean back into bed. Breakfast set up in front of patient. Patient reports that he plans to eat, but not at this moment.

## 2017-07-01 NOTE — H&P (Signed)
History and Physical    [name] [id number] DOB: 1945-08-25 DOA: 06/30/2017  PCP: Arnoldo Morale, MD  Patient coming from: Home  I have personally briefly reviewed patient's old medical records in [place]  Chief Complaint: AMS, generalized weakness  HPI: [name] is a 71 y.o. 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  Doing rehab still but not making a lot of progress.  11lb weight loss last month, poor appetite.  He presented to PCP today with ongoing confusion and falling asleep mid conversation over the past few days.  Breathing more shallow.  Daughter also feels memory worsened over past 2 weeks.  No recent leg swelling, CP.  ED Course: Work up in ED is significant for bi-basilar infiltrates on CXR which is being read as PNA.  WBC nl, afebrile and no SIRS, BNP 2434 (this is highest on record for patient).  Given 9m IV lasix in ED.  Daughter notes he missed his AM dose today.   Review of Systems: As per HPI otherwise 10 point review of systems negative.   Past Medical History:  Diagnosis Date  . CAD (coronary artery disease) 2007   [place] . [place]    a. EF 20-25% by echo in 03/2017 with NST showing prior infarct but no ischemia.   . [place]   . Hyperlipidemia   . Hypertension   . [place]ism ([place]   . [facility] ([facility]     Past Surgical History:  Procedure Laterality Date  . CARDIAC SURGERY    . IR PERCUTANEOUS ART THROMBECTOMY/INFUSION INTRACRANIAL INC DIAG ANGIO  04/25/2017  . PROSTATE SURGERY  2002     reports that  has never smoked. he has never used smokeless tobacco. He reports that he does not drink alcohol or use drugs.  No Known Allergies  Family History  Problem Relation Age of Onset  . Hypertension Mother   . Hypertension Sister      Prior to Admission  medications   Medication Sig Start Date End Date Taking? Authorizing Provider  acetaminophen (TYLENOL) 325 MG tablet Take 2 tablets (650 mg total) by mouth every 4 (four) hours as needed for headache or mild pain. 05/05/17  Yes SLyda JesterM, PA-C  aspirin EC 81 MG EC tablet Take 1 tablet (81 mg total) by mouth daily. 05/05/17  Yes SLyda JesterM, PA-C  atorvastatin (LIPITOR) 80 MG tablet Take 1 tablet (80 mg total) by mouth daily at 6 PM. 05/12/17  Yes Angiulli, DLavon Paganini PA-C  furosemide (LASIX) 80 MG tablet Take 0.5 tablets (40 mg total) by mouth daily. 05/12/17  Yes Angiulli, DLavon Paganini PA-C  gabapentin (NEURONTIN) 300 MG capsule Take 1 capsule (300 mg total) by mouth at bedtime. Patient taking differently: Take 200 mg by mouth at bedtime.  06/06/17  Yes Amao, ECharlane Ferretti MD  insulin glargine (LANTUS) 100 unit/mL SOPN Inject 0.1 mLs (10 Units total) into the skin at bedtime. 05/12/17  Yes Angiulli, DLavon Paganini PA-C  lactulose (CHRONULAC) 10 GM/15ML solution Take 15 mLs (10 g total) by mouth 3 (three) times daily. Patient taking differently: Take 10 g 2 (two) times daily by mouth.  06/06/17  Yes Amao, Enobong, MD  losartan (COZAAR) 25 MG tablet Take 0.5 tablets (12.5 mg total) by mouth daily. 06/02/17  Yes Amao, ECharlane Ferretti MD  NOVOLOG 100 UNIT/ML injection Inject 5 Units  into the skin at bedtime. 05/06/17  Yes [provider]  pantoprazole (PROTONIX) 40 MG tablet Take 1 tablet (40 mg total) by mouth daily. 05/12/17  Yes Angiulli, Lavon Paganini, PA-C  spironolactone (ALDACTONE) 12.5 mg TABS tablet Take 12.5 mg by mouth daily.   Yes [provider]  traMADol (ULTRAM) 50 MG tablet Take 1 tablet (50 mg total) by mouth every 12 (twelve) hours as needed for severe pain. 06/02/17  Yes Arnoldo Morale, MD  warfarin (COUMADIN) 7.5 MG tablet Take 1 tablet (7.5 mg total) by mouth daily at 6 PM. 05/12/17  Yes Angiulli, Lavon Paganini, PA-C  Blood Glucose Monitoring Suppl (TRUE METRIX METER) DEVI 1 kit by  Does not apply route 4 (four) times daily. 05/12/17   Angiulli, Lavon Paganini, PA-C  glucose blood (TRUE METRIX BLOOD GLUCOSE TEST) test strip Use as instructed 05/12/17   Angiulli, Lavon Paganini, PA-C  TRUEPLUS LANCETS 28G MISC 28 g by Does not apply route 4 (four) times daily. 04/23/17   Brayton Caves, PA-C    Physical Exam: Vitals:   06/30/17 2203 06/30/17 2300 06/30/17 2330 07/01/17 0023  BP: 108/82 105/79 110/69   Pulse: 94 95 (!) 103   Resp: 15 (!) 24 (!) 26   Temp:      TempSrc:      SpO2: 98% 99% 99% 99%  Weight:      Height:        Constitutional: NAD, calm, comfortable Eyes: PERRL, lids and conjunctivae normal ENMT: Mucous membranes are moist. Posterior pharynx clear of any exudate or lesions.Normal dentition.  Neck: normal, supple, no masses, no thyromegaly Respiratory: clear to auscultation bilaterally, no wheezing, no crackles. Normal respiratory effort. No accessory muscle use.  Cardiovascular: Regular rate and rhythm, no murmurs / rubs / gallops. No extremity edema. 2+ pedal pulses. No carotid bruits.  Abdomen: no tenderness, no masses palpated. No hepatosplenomegaly. Bowel sounds positive.  Musculoskeletal: no clubbing / cyanosis. No joint deformity upper and lower extremities. Good ROM, no contractures. Normal muscle tone.  Skin: no rashes, lesions, ulcers. No induration Neurologic: CN 2-12 grossly intact. Sensation intact, DTR normal. Strength 5/5 in all 4.  Psychiatric: Normal judgment and insight. Alert and oriented x 3. Normal mood.    Labs on Admission: I have personally reviewed following labs and imaging studies  CBC: Recent Labs  Lab 06/30/17 1652  WBC 6.3  HGB 12.2*  HCT 33.2*  MCV 68.7*  PLT 166   Basic Metabolic Panel: Recent Labs  Lab 06/30/17 1652  NA 133*  K 4.1  CL 100*  CO2 24  GLUCOSE 153*  BUN 22*  CREATININE 1.04  CALCIUM 9.4   GFR: Estimated Creatinine Clearance: 68.2 mL/min (by C-G formula based on SCr of 1.04 mg/dL). Liver Function  Tests: Recent Labs  Lab 06/30/17 1930  AST 21  ALT 23  ALKPHOS 104  BILITOT 1.2  PROT 6.4*  ALBUMIN 3.7   No results for input(s): LIPASE, AMYLASE in the last 168 hours. Recent Labs  Lab 06/30/17 1932  AMMONIA 39*   Coagulation Profile: No results for input(s): INR, PROTIME in the last 168 hours. Cardiac Enzymes: No results for input(s): CKTOTAL, CKMB, CKMBINDEX, TROPONINI in the last 168 hours. BNP (last 3 results) No results for input(s): PROBNP in the last 8760 hours. HbA1C: No results for input(s): HGBA1C in the last 72 hours. CBG: Recent Labs  Lab 06/30/17 1801  GLUCAP 149*   Lipid Profile: No results for input(s): CHOL, HDL, LDLCALC, TRIG,  CHOLHDL, LDLDIRECT in the last 72 hours. Thyroid Function Tests: No results for input(s): TSH, T4TOTAL, FREET4, T3FREE, THYROIDAB in the last 72 hours. Anemia Panel: No results for input(s): VITAMINB12, FOLATE, FERRITIN, TIBC, IRON, RETICCTPCT in the last 72 hours. Urine analysis:    Component Value Date/Time   COLORURINE YELLOW 06/30/2017 2030   APPEARANCEUR HAZY (A) 06/30/2017 2030   LABSPEC 1.018 06/30/2017 2030   [place] 5.0 06/30/2017 2030   GLUCOSEU NEGATIVE 06/30/2017 2030   [place] NEGATIVE 06/30/2017 2030   [place] NEGATIVE 06/30/2017 2030   BILIRUBINUR negative 06/30/2017 [place] 06/30/2017 2030   [place] NEGATIVE 06/30/2017 2030   UROBILINOGEN 1.0 06/30/2017 1518   NITRITE NEGATIVE 06/30/2017 2030   LEUKOCYTESUR SMALL (A) 06/30/2017 2030    Radiological Exams on Admission: Ct Head Wo Contrast  Result Date: 06/30/2017 CLINICAL DATA:  71 year old hypertensive diabetic male with altered mental status. Confusion and dizziness. History of strokes. Initial encounter. EXAM: CT HEAD WITHOUT CONTRAST TECHNIQUE: Contiguous axial images were obtained from the base of the skull through the vertex without intravenous contrast. COMPARISON:  04/28/2017 brain MR. 04/26/2017 head CT. FINDINGS: Brain:  No intracranial hemorrhage or CT evidence of large acute infarct. Remote right lenticular nucleus, caudate and right thalamic infarct. Remote tiny cerebellar infarcts. Moderate to marked small vessel disease. Global atrophy. No intracranial mass lesion noted on this unenhanced exam. Prominent falx calcifications unchanged. Vascular: Vascular calcifications Skull: No acute abnormality. Sinuses/Orbits: No acute orbital abnormality. Visualized paranasal sinuses are clear. Other: Mastoid air cells and middle ear cavities are clear. IMPRESSION: No intracranial hemorrhage or CT evidence of large acute infarct. Remote right lenticular nucleus, right caudate and right thalamic infarct. Remote tiny bilateral cerebellar infarcts. Moderate to marked small vessel disease. Global atrophy. Electronically Signed   By: Genia Del M.D.   On: 06/30/2017 20:18   Dg Chest Portable 1 View  Result Date: 06/30/2017 CLINICAL DATA:  Confusion EXAM: PORTABLE CHEST 1 VIEW COMPARISON:  05/06/2017 FINDINGS: Post sternotomy changes. Mild cardiomegaly. Hazy bibasilar opacity. No pneumothorax. IMPRESSION: Hazy bibasilar opacities suspicious for pneumonia. Cardiomegaly with mild vascular congestion. Electronically Signed   By: Donavan Foil M.D.   On: 06/30/2017 19:57   Dg Foot Complete Right  Result Date: 06/30/2017 CLINICAL DATA:  Right foot pain.  Nonhealing ulcer. EXAM: RIGHT FOOT COMPLETE - 3+ VIEW COMPARISON:  04/22/2017 FINDINGS: No sign of osteomyelitis. No fracture or dislocation. Ordinary mild midfoot osteoarthritis. Regional arterial calcification is present. IMPRESSION: No sign of osteomyelitis.  No other acute bone or joint finding. Electronically Signed   By: Nelson Chimes M.D.   On: 06/30/2017 13:08    EKG: Independently reviewed.  Assessment/Plan Principal Problem:   CAP (community acquired pneumonia) Active Problems:   Acute on chronic systolic heart failure ([name])   Type 2 diabetes mellitus with peripheral  neuropathy ([name])   Benign essential HTN   Right middle cerebral artery stroke ([place])    1. AMS - secondary to breathing difficulties most likely if anything new medical is going on 1. Mixed picture of CAP vs CHF vs both 2. CXR read and lack of Edema argue for CAP 3. Lack of SIRS, elevated BNP argue for CHF 2. CAP - 1. Will treat as CAP for now 2. pro calcitonin pending 3. Rocephin / azithro 4. Cultures pending 5. PNA pathway 3. Systolic CHF - unclear if chronic vs Acute on chronic 1. Continue home diuretics for CHF. 2. Continue home losartan 3. Strict intake and output 4. Daily  BMP 4. DM2 - 1. Reduce lantus to 6 units QHS 2. Sensitive SSI AC 5. HTN - continue home BP meds  DVT prophylaxis: Coumadin Code Status: Full Family Communication: Daughter at bedside Disposition Plan: TBD Consults called: None Admission status: Admit to inpatient   Etta Quill DO [facility] Pager [phone]  If 7AM-7PM, please contact day team taking care of patient [email or web address] Password [email or web address]  07/01/2017, 12:55 AM

## 2017-07-01 NOTE — ED Notes (Addendum)
Patient placed on hospital bed for comfort; pt able to ambulate to bed using walking and staff assistance

## 2017-07-01 NOTE — Progress Notes (Signed)
Procalcitonin level undetectable.  Favors CHF over PNA.  Will convert home Lasix to IV 40mg  daily. Stopping ABx for now. BMP daily. Just had echo done in Sept so will hold off on further echo for now.

## 2017-07-01 NOTE — Progress Notes (Addendum)
[name], [name] ([id number]) Visit Report for 06/30/2017 Arrival Information Details Patient Name: [name], [name] Date of Service: 06/30/2017 9:45 AM Medical Record Number: [id number] Patient Account Number: [id number] Date of Birth/Sex: 07/11/1946 (71 y.o. Male) Treating RN: Ashok Sanchez, Devin Primary Care [name]: [name] Other Clinician: Referring [name]: [name] Treating [name]/Extender: [name] in Treatment: 2 Visit Information History Since Last Visit All ordered tests and consults were completed: No Patient Arrived: Wheel Chair Added or deleted any medications: No Arrival Time: 10:03 Any new allergies or adverse reactions: No Accompanied By: daughter Had a fall or experienced change in No Transfer Assistance: EasyPivot Patient activities of daily living that may affect Lift risk of falls: Patient Identification Verified: Yes Signs or symptoms of abuse/neglect since last visito No Secondary Verification Process Yes Hospitalized since last visit: No Completed: Has Dressing in Place as Prescribed: Yes Patient Requires Transmission-Based No Precautions: Pain Present Now: Yes Patient Has Alerts: Yes Patient Alerts: Patient on Blood Thinner Translator Required TYPE II DIABETIC ABI: inaudible pulse Electronic Signature(s) Signed: 06/30/2017 5:35:20 PM By: [name] Entered By: [name] on 06/30/2017 10:06:10 [name] ([id number]) -------------------------------------------------------------------------------- Clinic Level of Care Assessment Details Patient Name: [name] Date of Service: 06/30/2017 9:45 AM Medical Record Number: [id number] Patient Account Number: [id number] Date of Birth/Sex: 05-15-1946 (71 y.o. Male) Treating RN: Ashok Sanchez, Devin Primary Care [name]: [name] Other Clinician: Referring Stephie Xu: [name] Treating Kayce Chismar/Extender: [name] in Treatment: 2 Clinic Level of Care  Assessment Items TOOL 4 Quantity Score X - Use when only an EandM is performed on FOLLOW-UP visit 1 0 ASSESSMENTS - Nursing Assessment / Reassessment X - Reassessment of Co-morbidities (includes updates in patient status) 1 10 X- 1 5 Reassessment of Adherence to Treatment Plan ASSESSMENTS - Wound and Skin Assessment / Reassessment X - Simple Wound Assessment / Reassessment - one wound 1 5 []  - 0 Complex Wound Assessment / Reassessment - multiple wounds []  - 0 Dermatologic / Skin Assessment (not related to wound area) ASSESSMENTS - Focused Assessment []  - Circumferential Edema Measurements - multi extremities 0 []  - 0 Nutritional Assessment / Counseling / Intervention []  - 0 Lower Extremity Assessment (monofilament, tuning fork, pulses) []  - 0 Peripheral Arterial Disease Assessment (using hand held doppler) ASSESSMENTS - Ostomy and/or Continence Assessment and Care []  - Incontinence Assessment and Management 0 []  - 0 Ostomy Care Assessment and Management (repouching, etc.) PROCESS - Coordination of Care X - Simple Patient / Family Education for ongoing care 1 15 []  - 0 Complex (extensive) Patient / Family Education for ongoing care []  - 0 Staff obtains Chiropractor, Records, Test Results / Process Orders []  - 0 Staff telephones HHA, Nursing Homes / Clarify orders / etc []  - 0 Routine Transfer to another Facility (non-emergent condition) []  - 0 Routine Hospital Admission (non-emergent condition) []  - 0 New Admissions / Manufacturing engineer / Ordering NPWT, Apligraf, etc. []  - 0 Emergency Hospital Admission (emergent condition) X- 1 10 Simple Discharge Coordination [employer], [name] ([id number]) []  - 0 Complex (extensive) Discharge Coordination PROCESS - Special Needs []  - Pediatric / Minor Patient Management 0 []  - 0 Isolation Patient Management []  - 0 Hearing / Language / Visual special needs []  - 0 Assessment of Community assistance (transportation, D/C planning,  etc.) []  - 0 Additional assistance / Altered mentation []  - 0 Support Surface(s) Assessment (bed, cushion, seat, etc.) INTERVENTIONS - Wound Cleansing / Measurement X - Simple Wound Cleansing - one wound 1 5 []  - 0 Complex Wound  Cleansing - multiple wounds X- 1 5 Wound Imaging (photographs - any number of wounds) []  - 0 Wound Tracing (instead of photographs) X- 1 5 Simple Wound Measurement - one wound []  - 0 Complex Wound Measurement - multiple wounds INTERVENTIONS - Wound Dressings X - Small Wound Dressing one or multiple wounds 1 10 []  - 0 Medium Wound Dressing one or multiple wounds []  - 0 Large Wound Dressing one or multiple wounds X- 1 5 Application of Medications - topical []  - 0 Application of Medications - injection INTERVENTIONS - Miscellaneous []  - External ear exam 0 []  - 0 Specimen Collection (cultures, biopsies, blood, body fluids, etc.) []  - 0 Specimen(s) / Culture(s) sent or taken to Lab for analysis []  - 0 Patient Transfer (multiple staff / Nurse, adult / Similar devices) []  - 0 Simple Staple / Suture removal (25 or less) []  - 0 Complex Staple / Suture removal (26 or more) []  - 0 Hypo / Hyperglycemic Management (close monitor of Blood Glucose) []  - 0 Ankle / Brachial Index (ABI) - do not check if billed separately X- 1 5 Vital Signs [name], [place] ([id number]) Has the patient been seen at the hospital within the last three years: Yes Total Score: 80 Level Of Care: New/Established - Level 3 Electronic Signature(s) Signed: 07/02/2017 4:49:34 PM By: [name] Previous Signature: 06/30/2017 5:35:20 PM Version By: [name] Entered By: [name] on 07/01/2017 09:46:19 [name] ([id number]) -------------------------------------------------------------------------------- Encounter Discharge Information Details Patient Name: [name] Date of Service: 06/30/2017 9:45 AM Medical Record Number: [id number] Patient Account Number:  [id number] Date of Birth/Sex: [birth year] (71 y.o. Male) Treating RN: Ashok Sanchez, Devin Primary Care [name]: [name] Other Clinician: Referring [name]: [name] Treating Kyian Obst/Extender: [name] in Treatment: 2 Encounter Discharge Information Items Discharge Pain Level: 4 Discharge Condition: Stable Ambulatory Status: Walker Discharge Destination: Home Transportation: Private Auto Accompanied By: daughter Schedule Follow-up Appointment: Yes Medication Reconciliation completed and No provided to Patient/Care Honest Vanleer: Provided on Clinical Summary of Care: 06/30/2017 Form Type Recipient Paper Patient IS Electronic Signature(s) Signed: 06/30/2017 5:35:20 PM By: [name] Entered By: [name] on 06/30/2017 10:38:50 [name] ([id number]) -------------------------------------------------------------------------------- Lower Extremity Assessment Details Patient Name: [name] Date of Service: 06/30/2017 9:45 AM Medical Record Number: [id number] Patient Account Number: [id number] Date of Birth/Sex: [birth year] (71 y.o. Male) Treating RN: Ashok Sanchez, Devin Primary Care Marceil Welp: [name] Other Clinician: Referring Giani Betzold: [name] Treating Diasia Henken/Extender: [name] in Treatment: 2 Vascular Assessment Pulses: Dorsalis Pedis Palpable: [Right:No] Doppler Audible: [Right:Inaudible] Posterior Tibial Extremity colors, hair growth, and conditions: Extremity Color: [Right:Normal] Temperature of Extremity: [Right:Cool] Capillary Refill: [Right:> 3 seconds] Toe Nail Assessment Left: Right: Thick: Yes Discolored: Yes Deformed: No Improper Length and Hygiene: No Electronic Signature(s) Signed: 06/30/2017 5:35:20 PM By: [name] Entered By: [name] on 06/30/2017 10:13:56 McAdoo, Kealii ([id number]) -------------------------------------------------------------------------------- Multi Wound Chart  Details Patient Name: [name] Date of Service: 06/30/2017 9:45 AM Medical Record Number: [id number] Patient Account Number: [id number] Date of Birth/Sex: 1945/12/30 (71 y.o. Male) Treating RN: Ashok Sanchez, Devin Primary Care Jonus Coble: [name] Other Clinician: Referring Lyndal Reggio: [name] Treating Emory Gallentine/Extender: [name] in Treatment: 2 Vital Signs Height(in): 70 Pulse(bpm): 78 Weight(lbs): 177 Blood Pressure(mmHg): 155/92 Body Mass Index(BMI): 25 Temperature(F): 98.0 Respiratory Rate 18 (breaths/min): Photos: [1:No Photos] [N/A:N/A] Wound Location: [1:Right Toe Third] [N/A:N/A] Wounding Event: [1:Trauma] [N/A:N/A] Primary Etiology: [1:Arterial Insufficiency Ulcer] [N/A:N/A] Secondary Etiology: [1:Trauma, Other] [N/A:N/A] Comorbid History: [1:Congestive Heart Failure, Coronary Artery Disease, Deep Vein Thrombosis, Hypertension, Peripheral  Arterial Disease, Type II Diabetes, Neuropathy] [N/A:N/A] Date Acquired: [1:03/16/2017] [N/A:N/A] Weeks of Treatment: [1:2] [N/A:N/A] Wound Status: [1:Open] [N/A:N/A] Measurements L x W x D [1:0.1x0.1x0.1] [N/A:N/A] (cm) Area (cm) : [1:0.008] [N/A:N/A] Volume (cm) : [1:0.001] [N/A:N/A] % Reduction in Area: [1:99.40%] [N/A:N/A] % Reduction in Volume: [1:99.30%] [N/A:N/A] Classification: [1:Partial Thickness] [N/A:N/A] Exudate Amount: [1:Medium] [N/A:N/A] Exudate Type: [1:Serous] [N/A:N/A] Exudate Color: [1:amber] [N/A:N/A] Wound Margin: [1:Indistinct, nonvisible] [N/A:N/A] Granulation Amount: [1:None Present (0%)] [N/A:N/A] Necrotic Amount: [1:Large (67-100%)] [N/A:N/A] Necrotic Tissue: [1:Eschar] [N/A:N/A] Exposed Structures: [1:Fascia: No Fat Layer (Subcutaneous Tissue) Exposed: No Tendon: No Muscle: No Joint: No Bone: No Limited to Skin Breakdown] [N/A:N/A] Epithelialization: [1:Small (1-33%)] [N/A:N/A] Periwound Skin Texture: No Abnormalities Noted N/A N/A Periwound Skin Moisture: No Abnormalities  Noted N/A N/A Periwound Skin Color: No Abnormalities Noted N/A N/A Tenderness on Palpation: No N/A N/A Wound Preparation: Ulcer Cleansing: N/A N/A Rinsed/Irrigated with Saline Topical Anesthetic Applied: Other: lidocaine 4% Treatment Notes Electronic Signature(s) Signed: 06/30/2017 10:29:13 AM By: Evlyn KannerBritto, Errol MD, FACS Entered By: Evlyn KannerBritto, Errol on 06/30/2017 10:29:13 Devin RhymeSANGARE, Janie ([id number]) -------------------------------------------------------------------------------- Multi-Disciplinary Care Plan Details Patient Name: Devin RhymeSANGARE, Anthonymichael Date of Service: 06/30/2017 9:45 AM Medical Record Number: [id number] Patient Account Number: [id number] Date of Birth/Sex: 11-08-1945 (71 y.o. Male) Treating RN: Ashok CordiaPinkerton, Devin Primary Care Ashtan Laton: Devin ShaggyAMAO, ENOBONG Other Clinician: Referring Cybele Maule: Devin ShaggyAMAO, ENOBONG Treating Mattheus Rauls/Extender: Devin ReBritto, Errol Weeks in Treatment: 2 Active Inactive Electronic Signature(s) Signed: 07/22/2017 2:15:13 PM By: Elliot GurneyWoody, BSN, RN, CWS, Kim RN, BSN Signed: 08/26/2017 2:58:53 PM By: Devin MullingPinkerton, Debra Previous Signature: 06/30/2017 5:35:20 PM Version By: Devin MullingPinkerton, Debra Entered By: Elliot GurneyWoody, BSN, RN, CWS, Kim on 07/22/2017 14:15:13 Devin RhymeSANGARE, Wenceslaus ([id number]) -------------------------------------------------------------------------------- Pain Assessment Details Patient Name: Devin RhymeSANGARE, Tramel Date of Service: 06/30/2017 9:45 AM Medical Record Number: [id number] Patient Account Number: [id number] Date of Birth/Sex: 11-08-1945 (71 y.o. Male) Treating RN: Ashok CordiaPinkerton, Devin Primary Care Tanis Hensarling: Devin ShaggyAMAO, ENOBONG Other Clinician: Referring Kimothy Kishimoto: Devin ShaggyAMAO, ENOBONG Treating Xayla Puzio/Extender: Devin ReBritto, Errol Weeks in Treatment: 2 Active Problems Location of Pain Severity and Description of Pain Patient Has Paino Yes Site Locations Rate the pain. Current Pain Level: 6 Character of Pain Describe the Pain: Aching Pain Management and Medication Current Pain Management: Electronic  Signature(s) Signed: 06/30/2017 5:35:20 PM By: Devin MullingPinkerton, Debra Entered By: Devin MullingPinkerton, Debra on 06/30/2017 10:06:26 Devin RhymeSANGARE, [place]le ([id number]) -------------------------------------------------------------------------------- Patient/Caregiver Education Details Patient Name: Devin RhymeSANGARE, Zade Date of Service: 06/30/2017 9:45 AM Medical Record Number: [id number] Patient Account Number: [id number] Date of Birth/Gender: 11-08-1945 (71 y.o. Male) Treating RN: Ashok CordiaPinkerton, Devin Primary Care Physician: Devin ShaggyAMAO, ENOBONG Other Clinician: Referring Physician: Jaclyn ShaggyAMAO, ENOBONG Treating Physician/Extender: Devin ReBritto, Errol Weeks in Treatment: 2 Education Assessment Education Provided To: Patient and Caregiver daughter Education Topics Provided Wound/Skin Impairment: Handouts: Other: change dressing as ordered Methods: Demonstration, Explain/Verbal Responses: State content correctly Electronic Signature(s) Signed: 06/30/2017 5:35:20 PM By: Devin MullingPinkerton, Debra Entered By: Devin MullingPinkerton, Debra on 06/30/2017 10:22:47 Richmond DaleSANGARE, Emmie NiemannISSA ([id number]) -------------------------------------------------------------------------------- Wound Assessment Details Patient Name: Devin RhymeSANGARE, Deyvi Date of Service: 06/30/2017 9:45 AM Medical Record Number: [id number] Patient Account Number: [id number] Date of Birth/Sex: 11-08-1945 (71 y.o. Male) Treating RN: Ashok CordiaPinkerton, Devin Primary Care Kellin Fifer: Devin ShaggyAMAO, ENOBONG Other Clinician: Referring Treysen Sudbeck: Devin ShaggyAMAO, ENOBONG Treating Yassmine Tamm/Extender: Devin ReBritto, Errol Weeks in Treatment: 2 Wound Status Wound Number: 1 Primary Arterial Insufficiency Ulcer Etiology: Wound Location: Right Toe Third Secondary Trauma, Other Wounding Event: Trauma Etiology: Date Acquired: 03/16/2017 Wound Open Weeks Of Treatment: 2 Status: Clustered Wound: No Comorbid Congestive Heart Failure, Coronary Artery History: Disease, Deep Vein Thrombosis, Hypertension, Peripheral Arterial Disease, Type II  Diabetes, Neuropathy Photos Photo Uploaded By: Ashok CordiaPinkerton,  Debra on 07/01/2017 08:14:03 Wound Measurements Length: (cm) 0.1 Width: (cm) 0.1 Depth: (cm) 0.1 Area: (cm) 0.008 Volume: (cm) 0.001 % Reduction in Area: 99.4% % Reduction in Volume: 99.3% Epithelialization: Small (1-33%) Tunneling: No Undermining: No Wound Description Classification: Partial Thickness Wound Margin: Indistinct, nonvisible Exudate Amount: Medium Exudate Type: Serous Exudate Color: amber Foul Odor After Cleansing: No Slough/Fibrino Yes Wound Bed Granulation Amount: None Present (0%) Exposed Structure Necrotic Amount: Large (67-100%) Fascia Exposed: No Necrotic Quality: Eschar Fat Layer (Subcutaneous Tissue) Exposed: No Tendon Exposed: No Muscle Exposed: No Joint Exposed: No Capuano, Orvil ([id number]) Bone Exposed: No Limited to Skin Breakdown Periwound Skin Texture Texture Color No Abnormalities Noted: No No Abnormalities Noted: No Moisture No Abnormalities Noted: No Wound Preparation Ulcer Cleansing: Rinsed/Irrigated with Saline Topical Anesthetic Applied: Other: lidocaine 4%, Electronic Signature(s) Signed: 06/30/2017 5:35:20 PM By: Devin MullingPinkerton, Debra Entered By: Devin MullingPinkerton, Debra on 06/30/2017 10:13:25 Devin RhymeSANGARE, [place] ([id number]) -------------------------------------------------------------------------------- Vitals Details Patient Name: Devin RhymeSANGARE, Whit Date of Service: 06/30/2017 9:45 AM Medical Record Number: [id number] Patient Account Number: [id number] Date of Birth/Sex: [birth year] (71 y.o. Male) Treating RN: Ashok CordiaPinkerton, Devin Primary Care Dawnetta Copenhaver: Devin ShaggyAMAO, ENOBONG Other Clinician: Referring Blessed Girdner: Devin ShaggyAMAO, ENOBONG Treating Marguetta Windish/Extender: Devin ReBritto, Errol Weeks in Treatment: 2 Vital Signs Time Taken: 10:06 Temperature (F): 98.0 Height (in): 70 Pulse (bpm): 78 Weight (lbs): 177 Respiratory Rate (breaths/min): 18 Body Mass Index (BMI): 25.4 Blood Pressure (mmHg):  155/92 Reference Range: 80 - 120 mg / dl Electronic Signature(s) Signed: 06/30/2017 5:35:20 PM By: Devin MullingPinkerton, Debra Entered By: Devin MullingPinkerton, Debra on 06/30/2017 10:10:03

## 2017-07-01 NOTE — ED Notes (Signed)
Daughter now at bedside. Speaks English well. Patient responsive to daughter.

## 2017-07-02 DIAGNOSIS — N390 Urinary tract infection, site not specified: Secondary | ICD-10-CM

## 2017-07-02 LAB — CBC WITH DIFFERENTIAL/PLATELET
Basophils Absolute: 0.1 10*3/uL (ref 0.0–0.1)
Basophils Relative: 1 %
EOS ABS: 0.1 10*3/uL (ref 0.0–0.7)
Eosinophils Relative: 2 %
HCT: 33.6 % — ABNORMAL LOW (ref 39.0–52.0)
HEMOGLOBIN: 12.2 g/dL — AB (ref 13.0–17.0)
LYMPHS PCT: 39 %
Lymphs Abs: 2.5 10*3/uL (ref 0.7–4.0)
MCH: 25 pg — ABNORMAL LOW (ref 26.0–34.0)
MCHC: 36.3 g/dL — ABNORMAL HIGH (ref 30.0–36.0)
MCV: 68.9 fL — ABNORMAL LOW (ref 78.0–100.0)
MONO ABS: 0.7 10*3/uL (ref 0.1–1.0)
Monocytes Relative: 11 %
NEUTROS PCT: 47 %
Neutro Abs: 3.1 10*3/uL (ref 1.7–7.7)
PLATELETS: 242 10*3/uL (ref 150–400)
RBC: 4.88 MIL/uL (ref 4.22–5.81)
RDW: 15 % (ref 11.5–15.5)
WBC: 6.5 10*3/uL (ref 4.0–10.5)

## 2017-07-02 LAB — BASIC METABOLIC PANEL
ANION GAP: 11 (ref 5–15)
BUN: 23 mg/dL — ABNORMAL HIGH (ref 6–20)
CHLORIDE: 102 mmol/L (ref 101–111)
CO2: 23 mmol/L (ref 22–32)
Calcium: 9.5 mg/dL (ref 8.9–10.3)
Creatinine, Ser: 1.11 mg/dL (ref 0.61–1.24)
GFR calc Af Amer: >60 mL/min (ref >60)
Glucose, Bld: 164 mg/dL — ABNORMAL HIGH (ref 65–99)
POTASSIUM: 4 mmol/L (ref 3.5–5.1)
Sodium: 136 mmol/L (ref 135–145)

## 2017-07-02 LAB — PROTIME-INR
INR: 2.57
PROTHROMBIN TIME: 27.4 s — AB (ref 11.4–15.2)

## 2017-07-02 LAB — GLUCOSE, CAPILLARY
GLUCOSE-CAPILLARY: 206 mg/dL — AB (ref 65–99)
Glucose-Capillary: 151 mg/dL — ABNORMAL HIGH (ref 65–99)
Glucose-Capillary: 245 mg/dL — ABNORMAL HIGH (ref 65–99)
Glucose-Capillary: 186 mg/dL — ABNORMAL HIGH (ref 65–99)

## 2017-07-02 LAB — URINE CULTURE

## 2017-07-02 MED ORDER — CIPROFLOXACIN IN D5W 400 MG/200ML IV SOLN
400.0000 mg | Freq: Two times a day (BID) | INTRAVENOUS | Status: DC
  Administered 2017-07-02 – 2017-07-03 (×2): 400 mg via INTRAVENOUS
  Filled 2017-07-02 (×2): qty 200

## 2017-07-02 MED ORDER — WARFARIN SODIUM 7.5 MG PO TABS
11.2500 mg | ORAL_TABLET | Freq: Once | ORAL | Status: AC
  Administered 2017-07-02: 11.25 mg via ORAL
  Filled 2017-07-02: qty 1.5

## 2017-07-02 NOTE — Progress Notes (Signed)
[employer] video interpreting used to assess pt. Pt is refusing meds at this time. He would like to wait for his daughter. Will try again when she is here.

## 2017-07-02 NOTE — Progress Notes (Signed)
ANTICOAGULATION CONSULT NOTE Pharmacy Consult for Coumadin Indication: h/o CVA  No Known Allergies  Patient Measurements: Height: 5\' 10"  (177.8 cm) Weight: 169 lb (76.7 kg) IBW/kg (Calculated) : 73  Vital Signs: Temp: 98.1 F (36.7 C) (11/14 0534) Temp Source: Oral (11/14 0534) BP: 98/65 (11/14 0534) Pulse Rate: 104 (11/14 0534)  Labs: Recent Labs    06/30/17 1652 07/01/17 0231 07/01/17 0328 07/02/17 0714  HGB 12.2*  --   --  12.2*  HCT 33.2*  --   --  33.6*  PLT 244  --   --  242  LABPROT  --  28.1*  --  27.4*  INR  --  2.66  --  2.57  CREATININE 1.04  --  1.05 1.11    Estimated Creatinine Clearance: 63.9 mL/min (by C-G formula based on SCr of 1.11 mg/dL).   Medical History: Past Medical History:  Diagnosis Date  . CAD (coronary artery disease) 2007   CAB[place] 2007 in [place]  . Chronic combined systolic and diastolic heart failure ([name])    a. EF 20-25% by echo in 03/2017 with NST showing prior infarct but no ischemia.   . Diabetes mellitus without complication ([name])   . Hyperlipidemia   . Hypertension   . Pulmonary embolism ([name])   . Stroke [facility]([name])     Assessment: 71 y.o. male admitted with AMS, h/o CVA, to continue Coumadin.  PTA dose: PTA dose: 11.25 mg on MWF and 7.5 mg AOD's per clinic   Goal of Therapy:  INR 2-3 Monitor platelets by anticoagulation protocol: Yes   Plan:  1. Coumadin 11.25 mg x 1 this evening as per home dose  2. Daily INR  Pollyann SamplesAndy Zoeie Ritter, PharmD, BCPS 07/02/2017, 8:54 AM

## 2017-07-02 NOTE — Progress Notes (Signed)
[facility]  Patient Status: Active (receiving services up to time of hospitalization)  [facility] is providing the following services: RN, PT and OT  If patient discharges after hours, please call [phone].   Kizzie FurnishDonna Fellmy 07/02/2017, 10:01 AM

## 2017-07-02 NOTE — Progress Notes (Signed)
[facility]  Patient Status: Active (receiving services up to time of hospitalization)  [facility] is providing the following services: RN, PT and OT  If patient discharges after hours, please call [phone].   Devin FurnishDonna Sanchez 07/02/2017, 5:32 PM

## 2017-07-02 NOTE — Progress Notes (Signed)
PROGRESS NOTE  Gardiner Rhymessa Keeling BJY:[id number] DOB: 06-Apr-1946 DOA: 06/30/2017 PCP: Jaclyn ShaggyAmao, Enobong, MD  HPI/Recap of past 2924 hours: 71 year old male with medical history significant for CAD, systolic CHF, right MCA stroke in September following embolectomy, diabetes, hypertension presents to the ED due to acute metabolic encephalopathy of unknown etiology for the past 1 week.  Patient was noted to have had about 11 pound weight loss, poor appetite, ongoing confusion, lethargy, worsening shortness of breath over the past 2 weeks.  Patient was referred to the ER by PCP.  In the ED chest x-ray showed possible bibasilar infiltrates, with BMP 2434. Patient admitted for further management.  Today, patient seemed to be more alert, awake, responds appropriately to questions.  Unable to have a detailed interview with patient as patient speaks mainly [place] and daughter was not at bedside.  Patient otherwise looked comfortable  Assessment/Plan: Principal Problem:   CAP (community acquired pneumonia) Active Problems:   Acute on chronic systolic heart failure ([name])   Type 2 diabetes mellitus with peripheral neuropathy ([name])   Benign essential HTN   Right middle cerebral artery stroke ([name])  #Acute metabolic encephalopathy Likely secondary to UTI UA showed small WBC, urine culture grew E faecalis Blood culture no growth so far Lactic acid within normal limits Starts IV ciprofloxacin for UTI CT head negative Monitor closely PT/OT once stable  #UTI UC growing Enterococcus faecalis Start IV ciprofloxacin  #Community-acquired pneumonia Asymptomatic Chest x-ray showing infiltration Lactic acid within normal limits, pro calcitonin within normal limits Will continue treatment for CAP, until patient improves IV Rocephin and azithromycin  #??  Acute on chronic systolic CHF Troponin negative, EKG no acute ST changes Continue IV diuresis, losartan Strict I's and O's, daily weights Daily BMP  #DM  type II Continue Lantus, sensitive SSI, Accu-Cheks  #Hypertension Continue home BP meds  #History of right middle cerebral artery stroke Stable, no focal neurologic deficit Continue aspirin, warfarin    Code Status: Full  Family Communication: None at bedside  Disposition Plan: Home once stable   Consultants:  None  Procedures:  None  Antimicrobials:  IV ciprofloxacin  IV ceftriaxone  IV azithromycin  DVT prophylaxis: On warfarin   Objective: Vitals:   07/01/17 1630 07/01/17 1706 07/01/17 2122 07/02/17 0534  BP: 106/67 100/73 (!) 96/58 98/65  Pulse: 88 96 95 (!) 104  Resp: 15 16 17 16   Temp:   98.6 F (37 C) 98.1 F (36.7 C)  TempSrc:   Oral Oral  SpO2: 100% 100% 100% 99%  Weight:  76.7 kg (169 lb)    Height:  5\' 10"  (1.778 m)      Intake/Output Summary (Last 24 hours) at 07/02/2017 1725 Last data filed at 07/02/2017 1015 Gross per 24 hour  Intake 180 ml  Output -  Net 180 ml   Filed Weights   06/30/17 1639 07/01/17 1706  Weight: 76.7 kg (169 lb) 76.7 kg (169 lb)    Exam:   General: Alert, awake, lethargic, easily arousable  Cardiovascular: S1-S2, no added heart sounds  Respiratory: Mild bibasilar crackles noted bilaterally  Abdomen: Soft, nontender, nondistended, bowel sounds present  Musculoskeletal: No pedal edema noted bilaterally, right second toe with nondraining ulcer  Skin: Normal  Psychiatry: Unable to assess  Neuro: No focal neurologic deficit   Data Reviewed: CBC: Recent Labs  Lab 06/30/17 1652 07/02/17 0714  WBC 6.3 6.5  NEUTROABS  --  3.1  HGB 12.2* 12.2*  HCT 33.2* 33.6*  MCV 68.7* 68.9*  PLT  244 242   Basic Metabolic Panel: Recent Labs  Lab 06/30/17 1652 07/01/17 0328 07/02/17 0714  NA 133* 133* 136  K 4.1 3.8 4.0  CL 100* 100* 102  CO2 24 22 23   GLUCOSE 153* 168* 164*  BUN 22* 22* 23*  CREATININE 1.04 1.05 1.11  CALCIUM 9.4 9.1 9.5   GFR: Estimated Creatinine Clearance: 63.9 mL/min (by  C-G formula based on SCr of 1.11 mg/dL). Liver Function Tests: Recent Labs  Lab 06/30/17 1930  AST 21  ALT 23  ALKPHOS 104  BILITOT 1.2  PROT 6.4*  ALBUMIN 3.7   No results for input(s): LIPASE, AMYLASE in the last 168 hours. Recent Labs  Lab 06/30/17 1932  AMMONIA 39*   Coagulation Profile: Recent Labs  Lab 07/01/17 0231 07/02/17 0714  INR 2.66 2.57   Cardiac Enzymes: No results for input(s): CKTOTAL, CKMB, CKMBINDEX, TROPONINI in the last 168 hours. BNP (last 3 results) No results for input(s): PROBNP in the last 8760 hours. HbA1C: No results for input(s): HGBA1C in the last 72 hours. CBG: Recent Labs  Lab 07/01/17 1715 07/01/17 2120 07/02/17 0836 07/02/17 1158 07/02/17 1656  GLUCAP 141* 139* 151* 245* 206*   Lipid Profile: No results for input(s): CHOL, HDL, LDLCALC, TRIG, CHOLHDL, LDLDIRECT in the last 72 hours. Thyroid Function Tests: No results for input(s): TSH, T4TOTAL, FREET4, T3FREE, THYROIDAB in the last 72 hours. Anemia Panel: No results for input(s): VITAMINB12, FOLATE, FERRITIN, TIBC, IRON, RETICCTPCT in the last 72 hours. Urine analysis:    Component Value Date/Time   COLORURINE YELLOW 06/30/2017 2030   APPEARANCEUR HAZY (A) 06/30/2017 2030   LABSPEC 1.018 06/30/2017 2030   PHURINE 5.0 06/30/2017 2030   GLUCOSEU NEGATIVE 06/30/2017 2030   HGBUR NEGATIVE 06/30/2017 2030   BILIRUBINUR NEGATIVE 06/30/2017 2030   BILIRUBINUR negative 06/30/2017 1518   KETONESUR NEGATIVE 06/30/2017 2030   PROTEINUR NEGATIVE 06/30/2017 2030   UROBILINOGEN 1.0 06/30/2017 1518   NITRITE NEGATIVE 06/30/2017 2030   LEUKOCYTESUR SMALL (A) 06/30/2017 2030   Sepsis Labs: @LABRCNTIP (procalcitonin:4,lacticidven:4)  ) Recent Results (from the past 240 hour(s))  Urine Culture     Status: Abnormal   Collection Time: 06/30/17  3:33 PM  Result Value Ref Range Status   Urine Culture, Routine Final report (A)  Final   Organism ID, Bacteria Enterococcus faecalis (A)   Final    Comment: Greater than 100,000 colony forming units per mL Note: this isolate is vancomycin-susceptible. This information is provided for epidemiologic purposes only: vancomycin is not among the antibiotics recommended for therapy of urinary tract infections caused by Enterococcus.    Antimicrobial Susceptibility Comment  Final    Comment:       ** S = Susceptible; I = Intermediate; R = Resistant **                    P = Positive; N = Negative             MICS are expressed in micrograms per mL    Antibiotic                 RSLT#1    RSLT#2    RSLT#3    RSLT#4 Ciprofloxacin                  S Levofloxacin                   S Nitrofurantoin  S Penicillin                     S Tetracycline                   R Vancomycin                     S   Culture, blood (routine x 2) Call MD if unable to obtain prior to antibiotics being given     Status: None (Preliminary result)   Collection Time: 07/01/17  1:00 AM  Result Value Ref Range Status   Specimen Description BLOOD LEFT ANTECUBITAL  Final   Special Requests   Final    BOTTLES DRAWN AEROBIC AND ANAEROBIC Blood Culture adequate volume   Culture NO GROWTH 1 DAY  Final   Report Status PENDING  Incomplete  Culture, blood (routine x 2) Call MD if unable to obtain prior to antibiotics being given     Status: None (Preliminary result)   Collection Time: 07/01/17  1:05 AM  Result Value Ref Range Status   Specimen Description BLOOD LEFT WRIST  Final   Special Requests   Final    BOTTLES DRAWN AEROBIC AND ANAEROBIC Blood Culture adequate volume   Culture NO GROWTH 1 DAY  Final   Report Status PENDING  Incomplete  Culture, Urine     Status: Abnormal (Preliminary result)   Collection Time: 07/01/17  4:30 PM  Result Value Ref Range Status   Specimen Description URINE, RANDOM  Final   Special Requests NONE  Final   Culture (A)  Final    >=100,000 COLONIES/mL ENTEROCOCCUS FAECALIS SUSCEPTIBILITIES TO FOLLOW    Report  Status PENDING  Incomplete      Studies: No results found.  Scheduled Meds: . aspirin EC  81 mg Oral Daily  . atorvastatin  80 mg Oral q1800  . azithromycin  250 mg Oral Daily  . feeding supplement (ENSURE ENLIVE)  237 mL Oral BID BM  . furosemide  40 mg Intravenous Daily  . insulin aspart  0-9 Units Subcutaneous TID WC  . insulin glargine  6 Units Subcutaneous QHS  . lactulose  10 g Oral BID  . losartan  12.5 mg Oral Daily  . pantoprazole  40 mg Oral Daily  . spironolactone  12.5 mg Oral Daily  . warfarin  11.25 mg Oral ONCE-1800  . Warfarin - Pharmacist Dosing Inpatient   Does not apply q1800    Continuous Infusions: . cefTRIAXone (ROCEPHIN)  IV Stopped (07/01/17 2313)     LOS: 1 day     Briant Cedar, MD [facility]  If 7PM-7AM, please contact night-coverage [email or web address] Password [email or web address] 07/02/2017, 5:25 PM

## 2017-07-03 LAB — CBC WITH DIFFERENTIAL/PLATELET
BASOS ABS: 0.1 10*3/uL (ref 0.0–0.1)
Basophils Relative: 1 %
EOS ABS: 0.1 10*3/uL (ref 0.0–0.7)
Eosinophils Relative: 2 %
HCT: 31.8 % — ABNORMAL LOW (ref 39.0–52.0)
HEMOGLOBIN: 11.5 g/dL — AB (ref 13.0–17.0)
LYMPHS PCT: 40 %
Lymphs Abs: 2.7 10*3/uL (ref 0.7–4.0)
MCH: 24.8 pg — AB (ref 26.0–34.0)
MCHC: 36.2 g/dL — ABNORMAL HIGH (ref 30.0–36.0)
MCV: 68.7 fL — ABNORMAL LOW (ref 78.0–100.0)
Monocytes Absolute: 0.7 10*3/uL (ref 0.1–1.0)
Monocytes Relative: 10 %
NEUTROS PCT: 47 %
Neutro Abs: 3.1 10*3/uL (ref 1.7–7.7)
PLATELETS: 242 10*3/uL (ref 150–400)
RBC: 4.63 MIL/uL (ref 4.22–5.81)
RDW: 15.3 % (ref 11.5–15.5)
WBC: 6.7 10*3/uL (ref 4.0–10.5)

## 2017-07-03 LAB — BASIC METABOLIC PANEL
ANION GAP: 9 (ref 5–15)
BUN: 24 mg/dL — ABNORMAL HIGH (ref 6–20)
CHLORIDE: 101 mmol/L (ref 101–111)
CO2: 25 mmol/L (ref 22–32)
Calcium: 9.2 mg/dL (ref 8.9–10.3)
Creatinine, Ser: 1.02 mg/dL (ref 0.61–1.24)
GFR calc non Af Amer: >60 mL/min (ref >60)
Glucose, Bld: 189 mg/dL — ABNORMAL HIGH (ref 65–99)
POTASSIUM: 3.6 mmol/L (ref 3.5–5.1)
SODIUM: 135 mmol/L (ref 135–145)

## 2017-07-03 LAB — GLUCOSE, CAPILLARY
GLUCOSE-CAPILLARY: 224 mg/dL — AB (ref 65–99)
GLUCOSE-CAPILLARY: 165 mg/dL — AB (ref 65–99)
GLUCOSE-CAPILLARY: 129 mg/dL — AB (ref 65–99)
Glucose-Capillary: 199 mg/dL — ABNORMAL HIGH (ref 65–99)

## 2017-07-03 LAB — PROTIME-INR
INR: 2.75
PROTHROMBIN TIME: 28.9 s — AB (ref 11.4–15.2)

## 2017-07-03 LAB — URINE CULTURE: Culture: >=100000 — AB

## 2017-07-03 MED ORDER — ADULT MULTIVITAMIN W/MINERALS CH
1.0000 | ORAL_TABLET | Freq: Every day | ORAL | Status: DC
  Administered 2017-07-03 – 2017-07-15 (×13): 1 via ORAL
  Filled 2017-07-03 (×14): qty 1

## 2017-07-03 MED ORDER — TRAMADOL HCL 50 MG PO TABS
50.0000 mg | ORAL_TABLET | Freq: Three times a day (TID) | ORAL | Status: DC
  Administered 2017-07-03 (×2): 50 mg via ORAL
  Filled 2017-07-03 (×3): qty 1

## 2017-07-03 MED ORDER — WARFARIN SODIUM 7.5 MG PO TABS
7.5000 mg | ORAL_TABLET | Freq: Once | ORAL | Status: AC
  Administered 2017-07-03: 7.5 mg via ORAL
  Filled 2017-07-03: qty 1

## 2017-07-03 MED ORDER — METOPROLOL TARTRATE 12.5 MG HALF TABLET
12.5000 mg | ORAL_TABLET | Freq: Two times a day (BID) | ORAL | Status: DC
  Administered 2017-07-03 – 2017-07-04 (×2): 12.5 mg via ORAL
  Filled 2017-07-03 (×3): qty 1

## 2017-07-03 MED ORDER — SODIUM CHLORIDE 0.9 % IV SOLN
3.0000 g | Freq: Three times a day (TID) | INTRAVENOUS | Status: DC
  Administered 2017-07-03 – 2017-07-04 (×3): 3 g via INTRAVENOUS
  Filled 2017-07-03 (×5): qty 3

## 2017-07-03 NOTE — Progress Notes (Signed)
PROGRESS NOTE  [name] [id number]:[id number] DOB: 01-Oct-1945 DOA: 06/30/2017 PCP: Jaclyn ShaggyAmao, Enobong, MD  HPI/Recap of past 4324 hours: 71 year old male with medical history significant for CAD, systolic CHF, right MCA stroke in September following embolectomy, diabetes, hypertension presents to the ED due to acute metabolic encephalopathy of unknown etiology for the past 1 week.  Patient was noted to have had about 11 pound weight loss, poor appetite, ongoing confusion, lethargy, worsening shortness of breath over the past 2 weeks.  Patient was referred to the ER by PCP.  In the ED chest x-ray showed possible bibasilar infiltrates, with BMP 2434. Patient admitted for further management.  Today, patient seemed to be more alert, awake, responds appropriately to basic questions. Daughter at bedside, interpretation was done by her. Pt denies any chest pain, worsening SOB, cough, abdominal pain. Pt reports only chronic pain on his R foot. Patient otherwise looked comfortable  Assessment/Plan: Principal Problem:   CAP (community acquired pneumonia) Active Problems:   Acute on chronic systolic heart failure ([name])   Type 2 diabetes mellitus with peripheral neuropathy ([name])   Benign essential HTN   Right middle cerebral artery stroke ([name])  #Acute metabolic encephalopathy Improving  Likely secondary to UTI UA showed small WBC, urine culture grew E faecalis Blood culture no growth so far Lactic acid within normal limits CT head negative IV Unasyn for UTI Monitor closely PT/OT once stable  #UTI UC growing Enterococcus faecalis IV Unasyn started 07/03/17  #Community-acquired pneumonia Asymptomatic Chest x-ray showing infiltration Lactic acid within normal limits, pro calcitonin within normal limits Will continue treatment for CAP, until patient improves S/p IV Rocephin, continue azithromycin for 5 days total  #Acute on chronic systolic CHF: ICMP Troponin negative, EKG no acute ST  changes ECHO 04/2017 with EF 20-25%, diffuse hypokinesis Continue IV diuresis, losartan, aldactone Strict I's and O's, daily weights Daily BMP  #NSVT Daily electrolytes As per PCP note hx of bradycardia. But HR has been in the 90s-100s In light of very low EF, will start lopressor 12.5mg  BID as BP permits Monitor closely   #CAD Chest pain free S/P 2 vessel CABG in 2007  #DM type II Continue Lantus, sensitive SSI, Accu-Cheks  #Hypertension Continue home BP meds  #History of right middle cerebral artery stroke in 04/2017 Stable, no focal neurologic deficit S/P R M1 mechanical thrombectomy with reperfusion by vascular  Continue aspirin, warfarin  #Right foot pain Trauma to R 3rd toe S/p keflex in 05/2017 No signs of infection, on IV unasyn for UTI   Code Status: Full  Family Communication: Daughter present  Disposition Plan: Home once stable   Consultants:  None  Procedures:  None  Antimicrobials:  IV Unasyn  IV azithromycin  DVT prophylaxis: On warfarin   Objective: Vitals:   07/03/17 0549 07/03/17 1219 07/03/17 1234 07/03/17 1525  BP:  (!) 81/59 (!) 81/59 100/65  Pulse:  95  60  Resp:    17  Temp:    98 F (36.7 C)  TempSrc:    Oral  SpO2:    100%  Weight: 73.8 kg (162 lb 12.8 oz)     Height:        Intake/Output Summary (Last 24 hours) at 07/03/2017 1546 Last data filed at 07/03/2017 1525 Gross per 24 hour  Intake 840 ml  Output 600 ml  Net 240 ml   Filed Weights   06/30/17 1639 07/01/17 1706 07/03/17 0549  Weight: 76.7 kg (169 lb) 76.7 kg (169 lb) 73.8 kg (162  lb 12.8 oz)    Exam:   General: Alert, awake  Cardiovascular: S1-S2, no added heart sounds, JVD present  Respiratory: Mild bibasilar crackles noted bilaterally  Abdomen: Soft, nontender, nondistended, bowel sounds present  Musculoskeletal: No pedal edema noted bilaterally, right third toe with nondraining ulcer  Skin: Normal  Psychiatry: Unable to assess  Neuro:  No focal neurologic deficit   Data Reviewed: CBC: Recent Labs  Lab 06/30/17 1652 07/02/17 0714 07/03/17 0523  WBC 6.3 6.5 6.7  NEUTROABS  --  3.1 3.1  HGB 12.2* 12.2* 11.5*  HCT 33.2* 33.6* 31.8*  MCV 68.7* 68.9* 68.7*  PLT 244 242 242   Basic Metabolic Panel: Recent Labs  Lab 06/30/17 1652 07/01/17 0328 07/02/17 0714 07/03/17 0523  NA 133* 133* 136 135  K 4.1 3.8 4.0 3.6  CL 100* 100* 102 101  CO2 24 22 23 25   GLUCOSE 153* 168* 164* 189*  BUN 22* 22* 23* 24*  CREATININE 1.04 1.05 1.11 1.02  CALCIUM 9.4 9.1 9.5 9.2   GFR: Estimated Creatinine Clearance: 69.6 mL/min (by C-G formula based on SCr of 1.02 mg/dL). Liver Function Tests: Recent Labs  Lab 06/30/17 1930  AST 21  ALT 23  ALKPHOS 104  BILITOT 1.2  PROT 6.4*  ALBUMIN 3.7   No results for input(s): LIPASE, AMYLASE in the last 168 hours. Recent Labs  Lab 06/30/17 1932  AMMONIA 39*   Coagulation Profile: Recent Labs  Lab 07/01/17 0231 07/02/17 0714 07/03/17 0523  INR 2.66 2.57 2.75   Cardiac Enzymes: No results for input(s): CKTOTAL, CKMB, CKMBINDEX, TROPONINI in the last 168 hours. BNP (last 3 results) No results for input(s): PROBNP in the last 8760 hours. HbA1C: No results for input(s): HGBA1C in the last 72 hours. CBG: Recent Labs  Lab 07/02/17 1158 07/02/17 1656 07/02/17 2052 07/03/17 0747 07/03/17 1207  GLUCAP 245* 206* 186* 224* 199*   Lipid Profile: No results for input(s): CHOL, HDL, LDLCALC, TRIG, CHOLHDL, LDLDIRECT in the last 72 hours. Thyroid Function Tests: No results for input(s): TSH, T4TOTAL, FREET4, T3FREE, THYROIDAB in the last 72 hours. Anemia Panel: No results for input(s): VITAMINB12, FOLATE, FERRITIN, TIBC, IRON, RETICCTPCT in the last 72 hours. Urine analysis:    Component Value Date/Time   COLORURINE YELLOW 06/30/2017 2030   APPEARANCEUR HAZY (A) 06/30/2017 2030   LABSPEC 1.018 06/30/2017 2030   PHURINE 5.0 06/30/2017 2030   GLUCOSEU NEGATIVE  06/30/2017 2030   HGBUR NEGATIVE 06/30/2017 2030   BILIRUBINUR NEGATIVE 06/30/2017 2030   BILIRUBINUR negative 06/30/2017 1518   KETONESUR NEGATIVE 06/30/2017 2030   PROTEINUR NEGATIVE 06/30/2017 2030   UROBILINOGEN 1.0 06/30/2017 1518   NITRITE NEGATIVE 06/30/2017 2030   LEUKOCYTESUR SMALL (A) 06/30/2017 2030   Sepsis Labs: @LABRCNTIP (procalcitonin:4,lacticidven:4)  ) Recent Results (from the past 240 hour(s))  Urine Culture     Status: Abnormal   Collection Time: 06/30/17  3:33 PM  Result Value Ref Range Status   Urine Culture, Routine Final report (A)  Final   Organism ID, Bacteria Enterococcus faecalis (A)  Final    Comment: Greater than 100,000 colony forming units per mL Note: this isolate is vancomycin-susceptible. This information is provided for epidemiologic purposes only: vancomycin is not among the antibiotics recommended for therapy of urinary tract infections caused by Enterococcus.    Antimicrobial Susceptibility Comment  Final    Comment:       ** S = Susceptible; I = Intermediate; R = Resistant **  P = Positive; N = Negative             MICS are expressed in micrograms per mL    Antibiotic                 RSLT#1    RSLT#2    RSLT#3    RSLT#4 Ciprofloxacin                  S Levofloxacin                   S Nitrofurantoin                 S Penicillin                     S Tetracycline                   R Vancomycin                     S   Culture, blood (routine x 2) Call MD if unable to obtain prior to antibiotics being given     Status: None (Preliminary result)   Collection Time: 07/01/17  1:00 AM  Result Value Ref Range Status   Specimen Description BLOOD LEFT ANTECUBITAL  Final   Special Requests   Final    BOTTLES DRAWN AEROBIC AND ANAEROBIC Blood Culture adequate volume   Culture NO GROWTH 2 DAYS  Final   Report Status PENDING  Incomplete  Culture, blood (routine x 2) Call MD if unable to obtain prior to antibiotics being given      Status: None (Preliminary result)   Collection Time: 07/01/17  1:05 AM  Result Value Ref Range Status   Specimen Description BLOOD LEFT WRIST  Final   Special Requests   Final    BOTTLES DRAWN AEROBIC AND ANAEROBIC Blood Culture adequate volume   Culture NO GROWTH 2 DAYS  Final   Report Status PENDING  Incomplete  Culture, Urine     Status: Abnormal   Collection Time: 07/01/17  4:30 PM  Result Value Ref Range Status   Specimen Description URINE, RANDOM  Final   Special Requests NONE  Final   Culture >=100,000 COLONIES/mL ENTEROCOCCUS FAECALIS (A)  Final   Report Status 07/03/2017 FINAL  Final   Organism ID, Bacteria ENTEROCOCCUS FAECALIS (A)  Final      Susceptibility   Enterococcus faecalis - MIC*    AMPICILLIN <=2 SENSITIVE Sensitive     LEVOFLOXACIN 1 SENSITIVE Sensitive     NITROFURANTOIN <=16 SENSITIVE Sensitive     VANCOMYCIN 1 SENSITIVE Sensitive     * >=100,000 COLONIES/mL ENTEROCOCCUS FAECALIS      Studies: No results found.  Scheduled Meds: . aspirin EC  81 mg Oral Daily  . atorvastatin  80 mg Oral q1800  . azithromycin  250 mg Oral Daily  . feeding supplement (ENSURE ENLIVE)  237 mL Oral BID BM  . furosemide  40 mg Intravenous Daily  . insulin aspart  0-9 Units Subcutaneous TID WC  . insulin glargine  6 Units Subcutaneous QHS  . lactulose  10 g Oral BID  . losartan  12.5 mg Oral Daily  . metoprolol tartrate  12.5 mg Oral BID  . multivitamin with minerals  1 tablet Oral Daily  . pantoprazole  40 mg Oral Daily  . spironolactone  12.5 mg Oral Daily  . traMADol  50 mg Oral Q8H  .  warfarin  7.5 mg Oral ONCE-1800  . Warfarin - Pharmacist Dosing Inpatient   Does not apply q1800    Continuous Infusions: . ampicillin-sulbactam (UNASYN) IV Stopped (07/03/17 1335)     LOS: 2 days     Briant CedarNkeiruka J Ezenduka, MD [facility]  If 7PM-7AM, please contact night-coverage [email or web address] Password [email or web address] 07/03/2017, 3:46 PM

## 2017-07-03 NOTE — Progress Notes (Signed)
Initial Nutrition Assessment  DOCUMENTATION CODES:   Non-severe (moderate) malnutrition in context of chronic illness  INTERVENTION:  1. Continue Ensure Enlive po BID, each supplement provides 350 kcal and 20 grams of protein 2. MVI w/ minerals  NUTRITION DIAGNOSIS:   Moderate Malnutrition related to chronic illness as evidenced by moderate fat depletion, moderate muscle depletion, percent weight loss  GOAL:   Patient will meet greater than or equal to 90% of their needs  MONITOR:   PO intake, Supplement acceptance, I & O's, Labs, Weight trends  REASON FOR ASSESSMENT:   Malnutrition Screening Tool    ASSESSMENT:   71 year old male with medical history significant for CAD, systolic CHF, right MCA stroke in September following embolectomy, diabetes, hypertension presents to the ED due to acute metabolic encephalopathy of unknown etiology for the past 1 week - CAP  Spoke with patient's family at bedside. Patient continues to be confused and is unable to respond appropriately. According to daughter, he had a stroke in September and has declined significantly since then. Has been eating eggs and a croissant for breakfast, potatoes, brown rice, and meat for lunch, may not eat dinner, or may drink ensure or eat oatmeal for that meal. They report him going from 179 pounds to 160 pounds over the past 2 weeks indicating a 19 pound/11% severe weight loss over 2 weeks. Appears he exhibits a 26 pound/14% severe weight loss over 2 months per chart. Drank ensure at bedside during my visit with daughter's encouragement. Meal completion 20-50% so far.  Labs reviewed:  CBGs: 224, 186 Medications reviewed and include:  Insulin, Lactulose    NUTRITION - FOCUSED PHYSICAL EXAM:    Most Recent Value  Orbital Region  No depletion  Upper Arm Region  Moderate depletion  Thoracic and Lumbar Region  No depletion  Buccal Region  No depletion  Temple Region  Mild depletion  Clavicle Bone Region   Moderate depletion  Clavicle and Acromion Bone Region  Moderate depletion  Scapular Bone Region  Moderate depletion  Dorsal Hand  Moderate depletion  Patellar Region  Moderate depletion  Anterior Thigh Region  Moderate depletion  Posterior Calf Region  Moderate depletion  Edema (RD Assessment)  None  Hair  Reviewed  Eyes  Reviewed  Mouth  Reviewed  Skin  Reviewed  Nails  Reviewed       Diet Order:  Diet heart healthy/carb modified Room service appropriate? Yes; Fluid consistency: Thin  EDUCATION NEEDS:   Education needs have been addressed  Skin:  Skin Assessment: Reviewed RN Assessment  Last BM:  07/01/2017 (type 4)  Height:   Ht Readings from Last 1 Encounters:  07/01/17 5\' 10"  (1.778 m)    Weight:   Wt Readings from Last 1 Encounters:  07/03/17 162 lb 12.8 oz (73.8 kg)    Ideal Body Weight:  75.45 kg  BMI:  Body mass index is 23.36 kg/m.  Estimated Nutritional Needs:   Kcal:  1700-2000 calories  Protein:  96-110 grams  Fluid:  1.7-2L   Dionne AnoWilliam M. Tesa Meadors, MS, RD LDN Inpatient Clinical Dietitian Pager [phone]

## 2017-07-03 NOTE — Progress Notes (Signed)
Pts BP 81/59 and HR 95. MD notified and made aware. Ordered to hold Metoprolol dose and recheck BP in an hour. Will continue to monitor.

## 2017-07-03 NOTE — Progress Notes (Signed)
ANTICOAGULATION CONSULT NOTE Pharmacy Consult for Coumadin Indication: h/o CVA  No Known Allergies  Patient Measurements: Height: 5\' 10"  (177.8 cm) Weight: 162 lb 12.8 oz (73.8 kg) IBW/kg (Calculated) : 73  Vital Signs: Temp: 97.6 F (36.4 C) (11/15 0421) Temp Source: Oral (11/15 0421) BP: 103/68 (11/15 0421) Pulse Rate: 94 (11/15 0421)  Labs: Recent Labs    06/30/17 1652 07/01/17 0231 07/01/17 0328 07/02/17 0714 07/03/17 0523  HGB 12.2*  --   --  12.2* 11.5*  HCT 33.2*  --   --  33.6* 31.8*  PLT 244  --   --  242 242  LABPROT  --  28.1*  --  27.4* 28.9*  INR  --  2.66  --  2.57 2.75  CREATININE 1.04  --  1.05 1.11 1.02    Estimated Creatinine Clearance: 69.6 mL/min (by C-G formula based on SCr of 1.02 mg/dL).   Medical History: Past Medical History:  Diagnosis Date  . CAD (coronary artery disease) 2007   [place]AB[place] 2007 [place]  . Chronic combined systolic and diastolic heart failure ([name])    a. EF 20-25% by echo in 03/2017 with NST showing prior infarct but no ischemia.   . Diabetes mellitus without complication ([name])   . Hyperlipidemia   . Hypertension   . Pulmonary embolism ([name])   . Stroke [facility]([name])     Assessment: 71 y.o. male admitted with AMS, h/o CVA, to continue Coumadin. INR 2.75  PTA dose: PTA dose: 11.25 mg on MWF and 7.5 mg AOD's per clinic   Goal of Therapy:  INR 2-3 Monitor platelets by anticoagulation protocol: Yes   Plan:  1. Coumadin 7.5 mg x 1 this evening as per home dose  2. Daily INR  Pollyann SamplesAndy Rhilee Currin, PharmD, BCPS 07/03/2017, 10:57 AM

## 2017-07-04 ENCOUNTER — Other Ambulatory Visit: Payer: Self-pay

## 2017-07-04 ENCOUNTER — Inpatient Hospital Stay (HOSPITAL_COMMUNITY): Payer: Self-pay

## 2017-07-04 DIAGNOSIS — G9340 Encephalopathy, unspecified: Secondary | ICD-10-CM

## 2017-07-04 LAB — BLOOD GAS, ARTERIAL
Acid-base deficit: 1.7 mmol/L (ref 0.0–2.0)
BICARBONATE: 21.9 mmol/L (ref 20.0–28.0)
Drawn by: 313941
O2 Content: 2.5 L/min
O2 Saturation: 98.2 %
PCO2 ART: 32.5 mmHg (ref 32.0–48.0)
PH ART: 7.441 (ref 7.350–7.450)
PO2 ART: 120 mmHg — AB (ref 83.0–108.0)
Patient temperature: 98

## 2017-07-04 LAB — BASIC METABOLIC PANEL
ANION GAP: 12 (ref 5–15)
BUN: 29 mg/dL — AB (ref 6–20)
CO2: 24 mmol/L (ref 22–32)
Calcium: 9.5 mg/dL (ref 8.9–10.3)
Chloride: 98 mmol/L — ABNORMAL LOW (ref 101–111)
Creatinine, Ser: 1.26 mg/dL — ABNORMAL HIGH (ref 0.61–1.24)
GFR calc non Af Amer: 56 mL/min — ABNORMAL LOW (ref >60)
GLUCOSE: 274 mg/dL — AB (ref 65–99)
Potassium: 4 mmol/L (ref 3.5–5.1)
SODIUM: 134 mmol/L — AB (ref 135–145)

## 2017-07-04 LAB — CBC WITH DIFFERENTIAL/PLATELET
BASOS PCT: 1 %
BASOS PCT: 0 %
Basophils Absolute: 0.1 10*3/uL (ref 0.0–0.1)
Basophils Absolute: 0 10*3/uL (ref 0.0–0.1)
EOS ABS: 0.1 10*3/uL (ref 0.0–0.7)
EOS PCT: 1 %
EOS PCT: 1 %
Eosinophils Absolute: 0.1 10*3/uL (ref 0.0–0.7)
HCT: 31.3 % — ABNORMAL LOW (ref 39.0–52.0)
HEMATOCRIT: 32.6 % — AB (ref 39.0–52.0)
HEMOGLOBIN: 11.5 g/dL — AB (ref 13.0–17.0)
Hemoglobin: 11.2 g/dL — ABNORMAL LOW (ref 13.0–17.0)
LYMPHS PCT: 26 %
LYMPHS PCT: 24 %
Lymphs Abs: 1.5 10*3/uL (ref 0.7–4.0)
Lymphs Abs: 1.2 10*3/uL (ref 0.7–4.0)
MCH: 24.2 pg — ABNORMAL LOW (ref 26.0–34.0)
MCH: 24.8 pg — AB (ref 26.0–34.0)
MCHC: 35.3 g/dL (ref 30.0–36.0)
MCHC: 35.8 g/dL (ref 30.0–36.0)
MCV: 68.6 fL — AB (ref 78.0–100.0)
MCV: 69.2 fL — ABNORMAL LOW (ref 78.0–100.0)
Monocytes Absolute: 0.6 10*3/uL (ref 0.1–1.0)
Monocytes Absolute: 0.5 10*3/uL (ref 0.1–1.0)
Monocytes Relative: 10 %
Monocytes Relative: 10 %
NEUTROS PCT: 62 %
NEUTROS PCT: 65 %
Neutro Abs: 3.4 10*3/uL (ref 1.7–7.7)
Neutro Abs: 3.2 10*3/uL (ref 1.7–7.7)
PLATELETS: 251 10*3/uL (ref 150–400)
Platelets: 220 10*3/uL (ref 150–400)
RBC: 4.75 MIL/uL (ref 4.22–5.81)
RBC: 4.52 MIL/uL (ref 4.22–5.81)
RDW: 15 % (ref 11.5–15.5)
RDW: 15 % (ref 11.5–15.5)
WBC: 5.7 10*3/uL (ref 4.0–10.5)
WBC: 5 10*3/uL (ref 4.0–10.5)

## 2017-07-04 LAB — COMPREHENSIVE METABOLIC PANEL
ALK PHOS: 210 U/L — AB (ref 38–126)
ALT: 101 U/L — AB (ref 17–63)
AST: 121 U/L — ABNORMAL HIGH (ref 15–41)
Albumin: 3.6 g/dL (ref 3.5–5.0)
Anion gap: 11 (ref 5–15)
BUN: 33 mg/dL — AB (ref 6–20)
CALCIUM: 9.4 mg/dL (ref 8.9–10.3)
CO2: 24 mmol/L (ref 22–32)
CREATININE: 1.42 mg/dL — AB (ref 0.61–1.24)
Chloride: 100 mmol/L — ABNORMAL LOW (ref 101–111)
GFR calc non Af Amer: 49 mL/min — ABNORMAL LOW (ref >60)
GFR, EST AFRICAN AMERICAN: 56 mL/min — AB (ref >60)
GLUCOSE: 279 mg/dL — AB (ref 65–99)
Potassium: 4.1 mmol/L (ref 3.5–5.1)
SODIUM: 135 mmol/L (ref 135–145)
Total Bilirubin: 1.1 mg/dL (ref 0.3–1.2)
Total Protein: 6.2 g/dL — ABNORMAL LOW (ref 6.5–8.1)

## 2017-07-04 LAB — D-DIMER, QUANTITATIVE (NOT AT ARMC): D DIMER QUANT: 0.58 ug{FEU}/mL — AB (ref 0.00–0.50)

## 2017-07-04 LAB — LACTIC ACID, PLASMA
Lactic Acid, Venous: 3 mmol/L (ref 0.5–1.9)
Lactic Acid, Venous: 2.2 mmol/L (ref 0.5–1.9)

## 2017-07-04 LAB — PROTIME-INR
INR: 3.26
Prothrombin Time: 33 seconds — ABNORMAL HIGH (ref 11.4–15.2)

## 2017-07-04 LAB — MAGNESIUM: MAGNESIUM: 1.9 mg/dL (ref 1.7–2.4)

## 2017-07-04 LAB — GLUCOSE, CAPILLARY
GLUCOSE-CAPILLARY: 241 mg/dL — AB (ref 65–99)
GLUCOSE-CAPILLARY: 199 mg/dL — AB (ref 65–99)
Glucose-Capillary: 272 mg/dL — ABNORMAL HIGH (ref 65–99)
Glucose-Capillary: 209 mg/dL — ABNORMAL HIGH (ref 65–99)

## 2017-07-04 LAB — TROPONIN I
Troponin I: <0.03 ng/mL (ref <0.03)
Troponin I: <0.03 ng/mL (ref <0.03)

## 2017-07-04 LAB — MRSA PCR SCREENING: MRSA by PCR: NEGATIVE

## 2017-07-04 MED ORDER — SODIUM CHLORIDE 0.9 % IV SOLN
INTRAVENOUS | Status: DC
  Administered 2017-07-04 – 2017-07-05 (×2): via INTRAVENOUS

## 2017-07-04 MED ORDER — AMOXICILLIN-POT CLAVULANATE 500-125 MG PO TABS
1.0000 | ORAL_TABLET | Freq: Three times a day (TID) | ORAL | Status: AC
  Administered 2017-07-04 – 2017-07-09 (×18): 500 mg via ORAL
  Filled 2017-07-04 (×19): qty 1

## 2017-07-04 MED ORDER — WARFARIN SODIUM 2.5 MG PO TABS
2.5000 mg | ORAL_TABLET | Freq: Once | ORAL | Status: AC
  Administered 2017-07-04: 2.5 mg via ORAL
  Filled 2017-07-04: qty 1

## 2017-07-04 MED ORDER — SODIUM CHLORIDE 0.9 % IV BOLUS (SEPSIS): 250.0000 mL | Freq: Once | INTRAVENOUS | Status: DC

## 2017-07-04 NOTE — Discharge Instructions (Signed)
Information on my medicine - Coumadin®   (Warfarin) ° ° °What test will check on my response to Coumadin? °While on Coumadin (warfarin) you will need to have an INR test regularly to ensure that your dose is keeping you in the desired range. The INR (international normalized ratio) number is calculated from the result of the laboratory test called prothrombin time (PT). ° °If an INR APPOINTMENT HAS NOT ALREADY BEEN MADE FOR YOU please schedule an appointment to have this lab work done by your health care provider within 7 days. °Your INR goal is usually a number between:  2 to 3 or your provider may give you a more narrow range like 2-2.5.  Ask your health care provider during an office visit what your goal INR is. ° °What  do you need to  know  About  COUMADIN? °Take Coumadin (warfarin) exactly as prescribed by your healthcare provider about the same time each day.  DO NOT stop taking without talking to the doctor who prescribed the medication.  Stopping without other blood clot prevention medication to take the place of Coumadin may increase your risk of developing a new clot or stroke.  Get refills before you run out. ° °What do you do if you miss a dose? °If you miss a dose, take it as soon as you remember on the same day then continue your regularly scheduled regimen the next day.  Do not take two doses of Coumadin at the same time. ° °Important Safety Information °A possible side effect of Coumadin (Warfarin) is an increased risk of bleeding. You should call your healthcare provider right away if you experience any of the following: °? Bleeding from an injury or your nose that does not stop. °? Unusual colored urine (red or dark brown) or unusual colored stools (red or black). °? Unusual bruising for unknown reasons. °? A serious fall or if you hit your head (even if there is no bleeding). ° °Some foods or medicines interact with Coumadin® (warfarin) and might alter your response to warfarin. To help avoid  this: °? Eat a balanced diet, maintaining a consistent amount of Vitamin K. °? Notify your provider about major diet changes you plan to make. °? Avoid alcohol or limit your intake to 1 drink for women and 2 drinks for men per day. °(1 drink is 5 oz. wine, 12 oz. beer, or 1.5 oz. liquor.) ° °Make sure that ANY health care provider who prescribes medication for you knows that you are taking Coumadin (warfarin).  Also make sure the healthcare provider who is monitoring your Coumadin knows when you have started a new medication including herbals and non-prescription products. ° °Coumadin® (Warfarin)  Major Drug Interactions  °Increased Warfarin Effect Decreased Warfarin Effect  °Alcohol (large quantities) °Antibiotics (esp. Septra/Bactrim, Flagyl, Cipro) °Amiodarone (Cordarone) °Aspirin (ASA) °Cimetidine (Tagamet) °Megestrol (Megace) °NSAIDs (ibuprofen, naproxen, etc.) °Piroxicam (Feldene) °Propafenone (Rythmol SR) °Propranolol (Inderal) °Isoniazid (INH) °Posaconazole (Noxafil) Barbiturates (Phenobarbital) °Carbamazepine (Tegretol) °Chlordiazepoxide (Librium) °Cholestyramine (Questran) °Griseofulvin °Oral Contraceptives °Rifampin °Sucralfate (Carafate) °Vitamin K  ° °Coumadin® (Warfarin) Major Herbal Interactions  °Increased Warfarin Effect Decreased Warfarin Effect  °Garlic °Ginseng °Ginkgo biloba Coenzyme Q10 °Green tea °St. John’s wort   ° °Coumadin® (Warfarin) FOOD Interactions  °Eat a consistent number of servings per week of foods HIGH in Vitamin K °(1 serving = ½ cup)  °Collards (cooked, or boiled & drained) °Kale (cooked, or boiled & drained) °Mustard greens (cooked, or boiled & drained) °Parsley *serving size only = ¼ cup °  Spinach (cooked, or boiled & drained) °Swiss chard (cooked, or boiled & drained) °Turnip greens (cooked, or boiled & drained)  °Eat a consistent number of servings per week of foods MEDIUM-HIGH in Vitamin K °(1 serving = 1 cup)  °Asparagus (cooked, or boiled & drained) °Broccoli (cooked,  boiled & drained, or raw & chopped) °Brussel sprouts (cooked, or boiled & drained) *serving size only = ½ cup °Lettuce, raw (green leaf, endive, romaine) °Spinach, raw °Turnip greens, raw & chopped  ° °These websites have more information on Coumadin (warfarin):  www.coumadin.com; °www.ahrq.gov/consumer/coumadin.htm; ° ° ° °

## 2017-07-04 NOTE — Progress Notes (Signed)
EEG complete - results pending 

## 2017-07-04 NOTE — Progress Notes (Signed)
Inpatient Diabetes Program Recommendations  AACE/ADA: New Consensus Statement on Inpatient Glycemic Control (2015)  Target Ranges:  Prepandial:   less than 140 mg/dL      Peak postprandial:   less than 180 mg/dL (1-2 hours)      Critically ill patients:  140 - 180 mg/dL   Lab Results  Component Value Date   GLUCAP 241 (H) 07/04/2017   HGBA1C 8.8 (H) 04/25/2017    Review of Glycemic Control  FBS > 180 mg/dL. Needs insulin adjustment.  Inpatient Diabetes Program Recommendations:    Increase Lantus to 8 units QHS  Continue to follow.  Thank you. Ailene Ardshonda Jameal Razzano, RD, LDN, CDE Inpatient Diabetes Coordinator [phone]

## 2017-07-04 NOTE — Care Management Note (Addendum)
Case Management Note  Patient Details  Name: Gardiner Rhymessa Feehan MRN: [id number] Date of Birth: [birth year]   Subjective/Objective:          Admitted with CAP, AMS. Resides with family. Hx of  CAD, CVA. [facility]PT,OT,RN).     Architectalimata Gerardo (Daughter) Deatra InaGagni Sissoko ([phone])      [phone]         PCP: Dr: Venetia NightAmao  Action/Plan: Return to home when medically stable. CM to f/u with disposition needs.  Expected Discharge Date:                  Expected Discharge Plan:  Home w Home Health Services  In-House Referral:     Discharge planning Services  CM Consult  Post Acute Care Choice:  Resumption of Svcs/PTA Provider Choice offered to:  Patient  DME Arranged:    DME Agency:     HH Arranged:  PT, OT, RN HH Agency:  [facility]  Status of Service:  In process, will continue to follow  If discussed at Long Length of Stay Meetings, dates discussed:    Additional Comments:  Epifanio LeschesCole, Robyne Matar Hudson, RN 07/04/2017, 12:42 PM

## 2017-07-04 NOTE — Progress Notes (Signed)
ANTICOAGULATION CONSULT NOTE Pharmacy Consult for Coumadin Indication: h/o CVA  No Known Allergies  Patient Measurements: Height: 5\' 10"  (177.8 cm) Weight: 162 lb 12.8 oz (73.8 kg) IBW/kg (Calculated) : 73  Vital Signs: Temp: 97.6 F (36.4 C) (11/16 0606) Temp Source: Oral (11/16 0606) BP: 115/83 (11/16 0606) Pulse Rate: 88 (11/16 0606)  Labs: Recent Labs    07/02/17 0714 07/03/17 0523 07/04/17 0352  HGB 12.2* 11.5* 11.5*  HCT 33.6* 31.8* 32.6*  PLT 242 242 251  LABPROT 27.4* 28.9* 33.0*  INR 2.57 2.75 3.26  CREATININE 1.11 1.02 1.26*    Estimated Creatinine Clearance: 56.3 mL/min (A) (by C-G formula based on SCr of 1.26 mg/dL (H)).   Medical History: Past Medical History:  Diagnosis Date  . CAD (coronary artery disease) 2007   CABG 2007 in [place]  . Chronic combined systolic and diastolic heart failure ([name])    a. EF 20-25% by echo in 03/2017 with NST showing prior infarct but no ischemia.   . Diabetes mellitus without complication ([name])   . Hyperlipidemia   . Hypertension   . Pulmonary embolism ([name])   . Stroke [facility]([name])     Assessment: 71 y.o. male admitted with AMS, h/o CVA, to continue Coumadin. INR 3.26 < 2.75  PTA dose: PTA dose: 11.25 mg on MWF and 7.5 mg AOD's per clinic   Goal of Therapy:  INR 2-3 Monitor platelets by anticoagulation protocol: Yes   Plan:  1. Reduce coumadin dose to 2.5 mg x 1 this evening  2. Daily INR  Pollyann SamplesAndy Bobi Daudelin, PharmD, BCPS 07/04/2017, 9:15 AM

## 2017-07-04 NOTE — Progress Notes (Signed)
[facility] Progress Note  Patient: [name] ZOX:[id number]   PCP: Jaclyn ShaggyAmao, Enobong, MD DOB: 1946/04/05   DOA: 06/30/2017   DOS: 07/04/2017   Date of Service: the patient was seen and examined on 07/04/2017  Subjective: Initially during the day patient was awake, was following commands although not coherent, did not have any acute complaint.  Later around noon time the patient had an event of hypotension followed by change in mental status requiring transfer to stepdown unit. On reevaluation later in the afternoon the patient was awake able to follow commands in no distress improved with IV hydration  Brief hospital course: Pt. with PMH of CAD, chronic systolic CHF, right MCA stroke, type II DM, HTN; admitted on 06/30/2017, presented with complaint of confusion, was found to have UTI and pneumonia. Currently further plan is continue close monitoring in the stepdown unit for now.  Assessment and Plan: 1.  Acute metabolic encephalopathy. Likely from UTI as well as pneumonia. Possibility of other etiology cannot be ruled out. CT scan unremarkable, ABG shows no hypercarbia. Troponin negative EKG negative. No focal deficit on examination right now other than confusion. Already treated with antibiotics with IV Unasyn, changing to oral Augmentin today. EEG completed, will follow up on the result.  Ammonia level mildly elevated, recheck tomorrow.  2.  UTI and community-acquired pneumonia. Urine is growing enterococcus sensitive to all antibiotics. There is a question of patient having pneumonia as well. Based on this I will change the patient to oral Augmentin only. Monitor. Blood cultures negative fo any bacterial growth.  3.  Acute on chronic systolic CHF. Echocardiogram shows 20-25% EF with diffuse hypokinesis. Patient was diuresed with IV Lasix. Also on losartan and Aldactone. Appears to have over diuresed and responding to IV fluid. We will continue the fluids for the next 6  hours. Hold Lasix for now.  4.  Multiple PVCs, And SVT. Monitor electrolytes. Beta-blocker need to be started but not with hypotension right now.  5.  Coronary artery disease. Two-vessel CABG in 2007. Monitor.  6.  Type II DM. Holding Lantus, continue sensitive sliding scale.  7.  Recent right MCA stroke in 2018 S/P mechanical thrombectomy that admission. Continue aspirin and Coumadin.  8.  Chest pain. Troponin negative, EKG negative. Follow serial troponin.  Monitor in stepdown unit.  Diet: As tolerated DVT Prophylaxis: mechanical compression device.  Advance goals of care discussion: full code  Family Communication: family was present at bedside, at the time of interview. The pt provided permission to discuss medical plan with the family. Opportunity was given to ask question and all questions were answered satisfactorily.   Disposition:  Discharge to be determined..  Consultants: none Procedures: EEG  Antibiotics: Anti-infectives (From admission, onward)   Start     Dose/Rate Route Frequency Ordered Stop   07/04/17 1000  amoxicillin-clavulanate (AUGMENTIN) 500-125 MG per tablet 500 mg     1 tablet Oral 3 times daily 07/04/17 0917     07/03/17 1200  Ampicillin-Sulbactam (UNASYN) 3 g in sodium chloride 0.9 % 100 mL IVPB  Status:  Discontinued     3 g 200 mL/hr over 30 Minutes Intravenous Every 8 hours 07/03/17 1159 07/04/17 0917   07/02/17 2000  azithromycin (ZITHROMAX) tablet 250 mg  Status:  Discontinued     250 mg Oral Daily 07/01/17 1050 07/04/17 0911   07/02/17 1830  ciprofloxacin (CIPRO) IVPB 400 mg  Status:  Discontinued     400 mg 200 mL/hr over 60 Minutes Intravenous Every  12 hours 07/02/17 1742 07/03/17 1159   07/01/17 2200  cefTRIAXone (ROCEPHIN) 1 g in dextrose 5 % 50 mL IVPB  Status:  Discontinued     1 g 100 mL/hr over 30 Minutes Intravenous Every 24 hours 07/01/17 1057 07/03/17 1158   07/01/17 2000  azithromycin (ZITHROMAX) tablet 500 mg     500 mg  Oral Daily 07/01/17 1050 07/01/17 2000   07/01/17 0100  cefTRIAXone (ROCEPHIN) 1 g in dextrose 5 % 50 mL IVPB  Status:  Discontinued     1 g 100 mL/hr over 30 Minutes Intravenous Daily at bedtime 07/01/17 0050 07/01/17 0334   07/01/17 0100  azithromycin (ZITHROMAX) tablet 500 mg  Status:  Discontinued     500 mg Oral Daily at bedtime 07/01/17 0050 07/01/17 0334       Objective: Physical Exam: Vitals:   07/04/17 1302 07/04/17 1304 07/04/17 1324 07/04/17 1348  BP: (!) 73/42 (!) 73/42 (!) 88/61 94/66  Pulse:  60    Resp:      Temp:      TempSrc:      SpO2:  100%    Weight:      Height:        Intake/Output Summary (Last 24 hours) at 07/04/2017 1623 Last data filed at 07/04/2017 0418 Gross per 24 hour  Intake 200 ml  Output -  Net 200 ml   Filed Weights   06/30/17 1639 07/01/17 1706 07/03/17 0549  Weight: 76.7 kg (169 lb) 76.7 kg (169 lb) 73.8 kg (162 lb 12.8 oz)   General: Alert, Awake and Oriented to Person. Appear in moderate distress, affect flat Eyes: PERRL, Conjunctiva normal ENT: Oral Mucosa clear moist. Neck: no JVD, no Abnormal Mass Or lumps Cardiovascular: S1 and S2 Present, no Murmur, Peripheral Pulses Present Respiratory: normal respiratory effort, Bilateral Air entry equal and Decreased, no use of accessory muscle, Clear to Auscultation, no Crackles, no wheezes Abdomen: Bowel Sound present, Soft and no tenderness, no hernia Skin: no redness, no Rash, no induration Extremities: no Pedal edema, no calf tenderness Neurologic: Grossly no focal neuro deficit. Bilaterally Equal motor strength  Data Reviewed: CBC: Recent Labs  Lab 06/30/17 1652 07/02/17 0714 07/03/17 0523 07/04/17 0352 07/04/17 1301  WBC 6.3 6.5 6.7 5.7 5.0  NEUTROABS  --  3.1 3.1 3.4 3.2  HGB 12.2* 12.2* 11.5* 11.5* 11.2*  HCT 33.2* 33.6* 31.8* 32.6* 31.3*  MCV 68.7* 68.9* 68.7* 68.6* 69.2*  PLT 244 242 242 251 220   Basic Metabolic Panel: Recent Labs  Lab 07/01/17 0328  07/02/17 0714 07/03/17 0523 07/04/17 0352 07/04/17 1301  NA 133* 136 135 134* 135  K 3.8 4.0 3.6 4.0 4.1  CL 100* 102 101 98* 100*  CO2 22 23 25 24 24   GLUCOSE 168* 164* 189* 274* 279*  BUN 22* 23* 24* 29* 33*  CREATININE 1.05 1.11 1.02 1.26* 1.42*  CALCIUM 9.1 9.5 9.2 9.5 9.4  MG  --   --   --  1.9  --     Liver Function Tests: Recent Labs  Lab 06/30/17 1930 07/04/17 1301  AST 21 121*  ALT 23 101*  ALKPHOS 104 210*  BILITOT 1.2 1.1  PROT 6.4* 6.2*  ALBUMIN 3.7 3.6   No results for input(s): LIPASE, AMYLASE in the last 168 hours. Recent Labs  Lab 06/30/17 1932  AMMONIA 39*   Coagulation Profile: Recent Labs  Lab 07/01/17 0231 07/02/17 0714 07/03/17 0523 07/04/17 0352  INR 2.66 2.57 2.75 3.26  Cardiac Enzymes: Recent Labs  Lab 07/04/17 1330  TROPONINI <0.03   BNP (last 3 results) No results for input(s): PROBNP in the last 8760 hours. CBG: Recent Labs  Lab 07/03/17 1207 07/03/17 1627 07/03/17 2217 07/04/17 0742 07/04/17 1205  GLUCAP 199* 165* 129* 241* 272*   Studies: Ct Head Wo Contrast  Result Date: 07/04/2017 CLINICAL DATA:  Altered mental status EXAM: CT HEAD WITHOUT CONTRAST TECHNIQUE: Contiguous axial images were obtained from the base of the skull through the vertex without intravenous contrast. COMPARISON:  Head CT 06/30/2017 FINDINGS: Brain: No mass lesion, intraparenchymal hemorrhage or extra-axial collection. No evidence of acute cortical infarct. There is periventricular hypoattenuation compatible with chronic microvascular disease. Old right thalamic and left cerebellar small infarcts. Vascular: No hyperdense vessel or unexpected calcification. Skull: Normal visualized skull base, calvarium and extracranial soft tissues. Sinuses/Orbits: No sinus fluid levels or advanced mucosal thickening. No mastoid effusion. Normal orbits. IMPRESSION: Unchanged examination without acute intracranial abnormality. Electronically Signed   By: Deatra RobinsonKevin  Herman  M.D.   On: 07/04/2017 14:28   Dg Chest Port 1 View  Result Date: 07/04/2017 CLINICAL DATA:  Shortness of breath. EXAM: PORTABLE CHEST 1 VIEW COMPARISON:  06/30/2017 FINDINGS: External pacemaker overlies the chest. Previous median sternotomy. Chronic cardiomegaly. Bilateral lower lobe atelectasis and/or pneumonia persists. Upper lungs are clear. No effusions. No acute bone finding. IMPRESSION: Persistent abnormal bilateral lower lobe density consistent with atelectasis and/or pneumonia. Electronically Signed   By: Paulina FusiMark  Shogry M.D.   On: 07/04/2017 13:26    Scheduled Meds: . amoxicillin-clavulanate  1 tablet Oral TID  . aspirin EC  81 mg Oral Daily  . atorvastatin  80 mg Oral q1800  . feeding supplement (ENSURE ENLIVE)  237 mL Oral BID BM  . insulin aspart  0-9 Units Subcutaneous TID WC  . multivitamin with minerals  1 tablet Oral Daily  . pantoprazole  40 mg Oral Daily  . warfarin  2.5 mg Oral ONCE-1800  . Warfarin - Pharmacist Dosing Inpatient   Does not apply q1800   Continuous Infusions: . sodium chloride 100 mL/hr at 07/04/17 1617  . sodium chloride     PRN Meds: acetaminophen  Time spent: The patient is critically ill with multiple organ systems failure and requires high complexity decision making for assessment and support, frequent evaluation and titration of therapies. Critical Care Time devoted to patient care services described in this note is 35 minutes   Author: Lynden OxfordPranav Makinna Andy, MD [facility] Pager: [phone] 07/04/2017 4:23 PM  If 7PM-7AM, please contact night-coverage at [email or web address], password [email or web address]

## 2017-07-04 NOTE — Evaluation (Signed)
Physical Therapy Evaluation Patient Details Name: [name] MRN: [id number] DOB: Apr 28, 1946 Today's Date: 07/04/2017   History of Present Illness  Pt is a 71 y/o male who presents with AMS ~1 week. He was found to have CAP. 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  Clinical Impression  Pt admitted with above diagnosis. Pt currently with functional limitations due to the deficits listed below (see PT Problem List). At the time of PT eval pt was able to perform transfers and ambulation with gross +2 max assist. Pt impulsive at times, however overall very lethargic with head hanging down for most of the session. Daughter reports she was providing assist for all mobility and ADL's, and wishes to take the pt home at d/c. I do not feel she will be able to physically assist the patient at this time, as he was requiring +2 max assist for all aspects of mobility. Pt will benefit from skilled PT to increase their independence and safety with mobility to allow discharge to the venue listed below.       Follow Up Recommendations SNF;Supervision/Assistance - 24 hour    Equipment Recommendations  3in1 (PT), if returning home, may want to consider a hospital bed    Recommendations for Other Services       Precautions / Restrictions Precautions Precautions: Fall Precaution Comments: Residual L side weakness from CVA in September 2018.  Restrictions Weight Bearing Restrictions: No      Mobility  Bed Mobility Overal bed mobility: Needs Assistance Bed Mobility: Supine to Sit;Sit to Supine     Supine to sit: Max assist;+2 for physical assistance;HOB elevated Sit to supine: Max assist;+2 for physical assistance   General bed mobility comments: Pt resisting at times, and not following commands. Appeared anxious with positional changes.   Transfers Overall transfer level: Needs assistance Equipment used: Rolling walker (2 wheeled) Transfers: Sit to/from  Stand Sit to Stand: Max assist;+2 physical assistance         General transfer comment: BLE's blocked and +2 assist provided to power-up to full standing position. Pt held ~20 seconds in which we attempted to improve posture. Pt very flexed in the trunk with head hanging down.   Ambulation/Gait             General Gait Details: Not safe to attempt at this time.   Stairs            Wheelchair Mobility    Modified Rankin (Stroke Patients Only)       Balance Overall balance assessment: Needs assistance Sitting-balance support: Feet supported;Bilateral upper extremity supported Sitting balance-Leahy Scale: Poor   Postural control: Left lateral lean   Standing balance-Leahy Scale: Zero Standing balance comment: +2 to maintain static standing                             Pertinent Vitals/Pain Pain Assessment: Faces Faces Pain Scale: No hurt    Home Living Family/patient expects to be discharged to:: Private residence Living Arrangements: Children Available Help at Discharge: Family;Available 24 hours/day Type of Home: House Home Access: Stairs to enter Entrance Stairs-Rails: None Entrance Stairs-Number of Steps: 3 Home Layout: One level Home Equipment: Cane - single point;Walker - 2 wheels      Prior Function Level of Independence: Needs assistance   Gait / Transfers Assistance Needed: Per daughter, used RW most and required additional assist from daughter with cane use.  ADL's / Homemaking Assistance Needed: Daughter was assisting for all ADL's.         Hand Dominance   Dominant Hand: Right    Extremity/Trunk Assessment   Upper Extremity Assessment Upper Extremity Assessment: LUE deficits/detail LUE Deficits / Details: Decreased strength and AROM consistent with residual deficits from prior stroke. Unable to complete MMT or sensation testing due to cognition.     Lower Extremity Assessment Lower Extremity Assessment: LLE  deficits/detail LLE Deficits / Details: Decreased strength and AROM consistent with residual deficits from prior stroke. Unable to complete MMT or sensation testing due to cognition.     Cervical / Trunk Assessment Cervical / Trunk Assessment: Other exceptions Cervical / Trunk Exceptions: Extreme forward head/rounded shoulder posture. Pt had difficulty maintaining upright posture.   Communication   Communication: Prefers language other than English(French; Daughter declined interpreter services )  Cognition Arousal/Alertness: Lethargic Behavior During Therapy: Flat affect;Impulsive Overall Cognitive Status: Impaired/Different from baseline Area of Impairment: Orientation;Attention;Memory;Following commands;Safety/judgement;Awareness;Problem solving                 Orientation Level: Disoriented to;Person;Place;Time;Situation Current Attention Level: Focused Memory: Decreased short-term memory Following Commands: (Does not follow commands) Safety/Judgement: Decreased awareness of safety;Decreased awareness of deficits Awareness: Intellectual Problem Solving: Slow processing;Decreased initiation;Difficulty sequencing;Requires verbal cues;Requires tactile cues        General Comments      Exercises     Assessment/Plan    PT Assessment Patient needs continued PT services  PT Problem List Decreased range of motion;Decreased strength;Decreased balance;Decreased activity tolerance;Decreased mobility;Decreased knowledge of use of DME;Decreased safety awareness;Decreased knowledge of precautions;Pain       PT Treatment Interventions DME instruction;Gait training;Stair training;Functional mobility training;Therapeutic activities;Therapeutic exercise;Neuromuscular re-education;Patient/family education    PT Goals (Current goals can be found in the Care Plan section)  Acute Rehab PT Goals Patient Stated Goal: Pt did not state goals PT Goal Formulation: Patient unable to  participate in goal setting Time For Goal Achievement: 07/18/17 Potential to Achieve Goals: Fair    Frequency Min 3X/week   Barriers to discharge Decreased caregiver support At this time, pt requiring heavy +2 for all mobility. Unsure how much assist the daughter will have at home, but do not feel she will be able to safely manage this patient's mobility needs in the home environment.     Co-evaluation               AM-PAC PT "6 Clicks" Daily Activity  Outcome Measure Difficulty turning over in bed (including adjusting bedclothes, sheets and blankets)?: Unable Difficulty moving from lying on back to sitting on the side of the bed? : Unable Difficulty sitting down on and standing up from a chair with arms (e.g., wheelchair, bedside commode, etc,.)?: Unable Help needed moving to and from a bed to chair (including a wheelchair)?: Total Help needed walking in hospital room?: Total Help needed climbing 3-5 steps with a railing? : Total 6 Click Score: 6    End of Session Equipment Utilized During Treatment: Gait belt Activity Tolerance: Patient limited by lethargy Patient left: in bed;with bed alarm set;with call bell/phone within reach;with family/visitor present Nurse Communication: Mobility status PT Visit Diagnosis: Unsteadiness on feet (R26.81);Muscle weakness (generalized) (M62.81);Difficulty in walking, not elsewhere classified (R26.2)    Time: 1100-1134 PT Time Calculation (min) (ACUTE ONLY): 34 min   Charges:   PT Evaluation $PT Eval Moderate Complexity: 1 Mod PT Treatments $Gait Training: 8-22 mins   PT G Codes:  Conni SlipperLaura Nyia Tsao, PT, DPT Acute Rehabilitation Services Pager: [phone]   Marylynn PearsonLaura D Azzure Garabedian 07/04/2017, 2:14 PM

## 2017-07-04 NOTE — Significant Event (Signed)
Rapid Response Event Note  Overview: Time Called: 1210 Arrival Time: 1211 Event Type: Hypotension, Cardiac  Initial Focused Assessment:  Called to bedside for a pt with decreased responsiveness, chest pain, and hypotension.  Bedside RN reports sudden onset of non radiating chest pain.  BP 70-80 systolic initially.   On my arrival, the pt was hunched over in bed, he was intermittently responding to commands.  His pupils were equal and reactive.  He did move all extremities purposefully.  His respirations were shallow with a rate of 20-25/min.  O2 saturation 100% on 2Lpm.  Lung sounds CTA.  EKG showed NSR with frequent PVCs, monitor showed occasional trigeminy.  Heart sounds with normal s1 s2.  Skin was warm and dry, radial pulses 1+.  Abdomen soft and non tender.   Interventions:  EKG, CBG,  IV access,   Dr. Allena KatzPatel ordered 2 x 250ml NS bolus, PCXR, ABG, CT head, Labs (troponin, d dimer, cbc, bmet, lactic acid)  Pt's BP improved after 2nd 250ml bolus to 94/66. ABG was normal. CT head negative for acute process.  CXR with bilateral lower lobe findings suggestive of pneumonia.    Plan of Care (if not transferred):   Following return from CT scanner, pt was transferred to 6E13 for further care.    Event Summary: Name of Physician Notified: Dr. Allena KatzPatel at 1215    at    Outcome: Transferred (Comment)     Dan MakerUNLOP, Ailis Rigaud

## 2017-07-04 NOTE — Procedures (Signed)
History: 71 year old male being evaluated for acute encephalopathy  Sedation: None  Technique: This is a 21 channel routine scalp EEG performed at the bedside with bipolar and monopolar montages arranged in accordance to the international 10/20 system of electrode placement. One channel was dedicated to EKG recording.    Background: The background consists dominantly of generalized irregular delta activity.  There is a poorly formed, poorly sustained posterior dominant rhythm of 6-7 Hz which is seen at times.  No epileptiform discharges are seen.  Photic stimulation: Physiologic driving is not performed  EEG Abnormalities: 1) generalized irregular slow activity 2) slow PDR  Clinical Interpretation: This EEG is consistent with a generalized nonspecific cerebral dysfunction(encephalopathy). There was no seizure or seizure predisposition recorded on this study. Please note that a normal EEG does not preclude the possibility of epilepsy.   Ritta SlotMcNeill Kirkpatrick, MD [facility] [phone]  If 7pm- 7am, please page neurology on call as listed in AMION.

## 2017-07-05 ENCOUNTER — Inpatient Hospital Stay (HOSPITAL_COMMUNITY): Payer: Self-pay

## 2017-07-05 LAB — BLOOD GAS, ARTERIAL
Acid-base deficit: 3 mmol/L — ABNORMAL HIGH (ref 0.0–2.0)
Bicarbonate: 19.1 mmol/L — ABNORMAL LOW (ref 20.0–28.0)
DRAWN BY: 520771
FIO2: 21
O2 Saturation: 98.9 %
PATIENT TEMPERATURE: 98.6
pCO2 arterial: 21.4 mmHg — ABNORMAL LOW (ref 32.0–48.0)
pH, Arterial: 7.558 — ABNORMAL HIGH (ref 7.350–7.450)
pO2, Arterial: 147 mmHg — ABNORMAL HIGH (ref 83.0–108.0)

## 2017-07-05 LAB — COMPREHENSIVE METABOLIC PANEL
ALBUMIN: 3.5 g/dL (ref 3.5–5.0)
ALT: 91 U/L — ABNORMAL HIGH (ref 17–63)
ANION GAP: 11 (ref 5–15)
AST: 57 U/L — AB (ref 15–41)
Alkaline Phosphatase: 213 U/L — ABNORMAL HIGH (ref 38–126)
BILIRUBIN TOTAL: 1 mg/dL (ref 0.3–1.2)
BUN: 37 mg/dL — ABNORMAL HIGH (ref 6–20)
CHLORIDE: 104 mmol/L (ref 101–111)
CO2: 22 mmol/L (ref 22–32)
Calcium: 9.4 mg/dL (ref 8.9–10.3)
Creatinine, Ser: 1.22 mg/dL (ref 0.61–1.24)
GFR calc Af Amer: >60 mL/min (ref >60)
GFR calc non Af Amer: 58 mL/min — ABNORMAL LOW (ref >60)
GLUCOSE: 214 mg/dL — AB (ref 65–99)
POTASSIUM: 4.2 mmol/L (ref 3.5–5.1)
SODIUM: 137 mmol/L (ref 135–145)
TOTAL PROTEIN: 6.4 g/dL — AB (ref 6.5–8.1)

## 2017-07-05 LAB — VITAMIN B12: VITAMIN B 12: 1170 pg/mL — AB (ref 180–914)

## 2017-07-05 LAB — GLUCOSE, CAPILLARY
GLUCOSE-CAPILLARY: 237 mg/dL — AB (ref 65–99)
GLUCOSE-CAPILLARY: 228 mg/dL — AB (ref 65–99)
Glucose-Capillary: 217 mg/dL — ABNORMAL HIGH (ref 65–99)
Glucose-Capillary: 323 mg/dL — ABNORMAL HIGH (ref 65–99)

## 2017-07-05 LAB — CBC WITH DIFFERENTIAL/PLATELET
BASOS ABS: 0.1 10*3/uL (ref 0.0–0.1)
Basophils Relative: 1 %
Eosinophils Absolute: 0.1 10*3/uL (ref 0.0–0.7)
Eosinophils Relative: 1 %
HEMATOCRIT: 32.9 % — AB (ref 39.0–52.0)
HEMOGLOBIN: 12.1 g/dL — AB (ref 13.0–17.0)
LYMPHS PCT: 31 %
Lymphs Abs: 1.9 10*3/uL (ref 0.7–4.0)
MCH: 25.5 pg — ABNORMAL LOW (ref 26.0–34.0)
MCHC: 36.8 g/dL — AB (ref 30.0–36.0)
MCV: 69.4 fL — ABNORMAL LOW (ref 78.0–100.0)
MONOS PCT: 14 %
Monocytes Absolute: 0.8 10*3/uL (ref 0.1–1.0)
NEUTROS ABS: 3.1 10*3/uL (ref 1.7–7.7)
Neutrophils Relative %: 53 %
PLATELETS: 233 10*3/uL (ref 150–400)
RBC: 4.74 MIL/uL (ref 4.22–5.81)
RDW: 15.4 % (ref 11.5–15.5)
WBC: 6 10*3/uL (ref 4.0–10.5)

## 2017-07-05 LAB — LACTIC ACID, PLASMA: Lactic Acid, Venous: 1.8 mmol/L (ref 0.5–1.9)

## 2017-07-05 LAB — TROPONIN I: Troponin I: <0.03 ng/mL (ref <0.03)

## 2017-07-05 LAB — PROTIME-INR
INR: 3.34
Prothrombin Time: 33.6 seconds — ABNORMAL HIGH (ref 11.4–15.2)

## 2017-07-05 LAB — MAGNESIUM: Magnesium: 2.2 mg/dL (ref 1.7–2.4)

## 2017-07-05 LAB — AMMONIA: AMMONIA: 16 umol/L (ref 9–35)

## 2017-07-05 MED ORDER — WARFARIN SODIUM 2.5 MG PO TABS
2.5000 mg | ORAL_TABLET | Freq: Once | ORAL | Status: AC
  Administered 2017-07-05: 2.5 mg via ORAL
  Filled 2017-07-05: qty 1

## 2017-07-05 MED ORDER — TRAMADOL HCL 50 MG PO TABS: 25.0000 mg | ORAL_TABLET | Freq: Four times a day (QID) | ORAL | Status: DC | PRN

## 2017-07-05 MED ORDER — INSULIN ASPART 100 UNIT/ML ~~LOC~~ SOLN
0.0000 [IU] | Freq: Three times a day (TID) | SUBCUTANEOUS | Status: DC
  Administered 2017-07-05: 3 [IU] via SUBCUTANEOUS
  Administered 2017-07-05: 7 [IU] via SUBCUTANEOUS
  Administered 2017-07-05 – 2017-07-06 (×4): 3 [IU] via SUBCUTANEOUS
  Administered 2017-07-07: 2 [IU] via SUBCUTANEOUS
  Administered 2017-07-07 (×2): 3 [IU] via SUBCUTANEOUS
  Administered 2017-07-08: 2 [IU] via SUBCUTANEOUS

## 2017-07-05 MED ORDER — LORAZEPAM 2 MG/ML IJ SOLN
0.5000 mg | Freq: Once | INTRAMUSCULAR | Status: AC | PRN
  Administered 2017-07-05: 0.5 mg via INTRAVENOUS
  Filled 2017-07-05: qty 1

## 2017-07-05 MED ORDER — MIRTAZAPINE 7.5 MG PO TABS: 7.5000 mg | ORAL_TABLET | Freq: Every day | ORAL | Status: DC

## 2017-07-05 MED ORDER — LORAZEPAM 2 MG/ML IJ SOLN
0.5000 mg | INTRAMUSCULAR | Status: DC | PRN
  Administered 2017-07-07: 0.5 mg via INTRAVENOUS
  Filled 2017-07-05: qty 1

## 2017-07-05 MED ORDER — QUETIAPINE FUMARATE 25 MG PO TABS
25.0000 mg | ORAL_TABLET | Freq: Two times a day (BID) | ORAL | Status: DC
  Administered 2017-07-05 – 2017-07-15 (×22): 25 mg via ORAL
  Filled 2017-07-05 (×22): qty 1

## 2017-07-05 MED ORDER — INSULIN ASPART 100 UNIT/ML ~~LOC~~ SOLN
0.0000 [IU] | Freq: Every day | SUBCUTANEOUS | Status: DC
  Administered 2017-07-05 – 2017-07-06 (×2): 2 [IU] via SUBCUTANEOUS

## 2017-07-05 MED ORDER — MIRTAZAPINE 7.5 MG PO TABS: 15.0000 mg | ORAL_TABLET | Freq: Every day | ORAL | Status: DC

## 2017-07-05 MED ORDER — HALOPERIDOL LACTATE 5 MG/ML IJ SOLN
2.0000 mg | Freq: Four times a day (QID) | INTRAMUSCULAR | Status: DC | PRN
  Administered 2017-07-05: 2 mg via INTRAVENOUS
  Filled 2017-07-05: qty 1

## 2017-07-05 MED ORDER — METHOCARBAMOL 500 MG PO TABS
500.0000 mg | ORAL_TABLET | Freq: Three times a day (TID) | ORAL | Status: DC | PRN
  Administered 2017-07-05: 500 mg via ORAL
  Filled 2017-07-05: qty 1

## 2017-07-05 MED ORDER — GABAPENTIN 100 MG PO CAPS: 100.0000 mg | ORAL_CAPSULE | Freq: Every day | ORAL | Status: DC

## 2017-07-05 NOTE — Progress Notes (Addendum)
Patient removed condom catheter, 150 mL measured.   Patient was complaining of inability to urinate. Bladder scan showed > 200 mL of urine. Patient attempted to stand up and pee, but unable. Tried many times to use urinal, unsuccessful. Patient still very uncomfortable, paged MD Allena KatzPatel. Order for In and out cath. Patient still confused but family at bedside, verbalized understanding. In and out cath done, 425 cc urine out. Pt stated he felt much better. Will continue to monitor.

## 2017-07-05 NOTE — Progress Notes (Signed)
Patient agitated and restless. Patient states "I can't breath", on nasal canula 2 liters. Lungs are diminished, clear. No cough. Attempted repositioning, food, fluids. Family at bedside. Paged MD, started on Seroquel BID, gave first dose. Denies any pain. Daughter of patient states that he's been somewhat confused during his stay but it's worse today.   Gave PRN Ativan IV dose prior to MRI brain, unable to complete scan due to restlessness and not following directions. Paged MD Allena KatzPatel to make aware of situation. Added haldol 2 mg IV, administered haldol IV. Family at bedside. Will continue to monitor,

## 2017-07-05 NOTE — Progress Notes (Signed)
ANTICOAGULATION CONSULT NOTE Pharmacy Consult for Coumadin Indication: h/o CVA  No Known Allergies  Patient Measurements: Height: 5\' 10"  (177.8 cm) Weight: 166 lb 7.2 oz (75.5 kg) IBW/kg (Calculated) : 73  Vital Signs: Temp: 97.5 F (36.4 C) (11/17 1258) Temp Source: Oral (11/17 1258) BP: 128/89 (11/17 1258) Pulse Rate: 96 (11/17 1258)  Labs: Recent Labs    07/03/17 0523 07/04/17 0352 07/04/17 1301  07/04/17 1853 07/05/17 0216 07/05/17 0727  HGB 11.5* 11.5* 11.2*  --   --   --  12.1*  HCT 31.8* 32.6* 31.3*  --   --   --  32.9*  PLT 242 251 220  --   --   --  233  LABPROT 28.9* 33.0*  --   --   --  33.6*  --   INR 2.75 3.26  --   --   --  3.34  --   CREATININE 1.02 1.26* 1.42*  --   --   --  1.22  TROPONINI  --   --   --    < > <0.03 <0.03 <0.03   < > = values in this interval not displayed.    Estimated Creatinine Clearance: 58.2 mL/min (by C-G formula based on SCr of 1.22 mg/dL).   Medical History: Past Medical History:  Diagnosis Date  . CAD (coronary artery disease) 2007   CABG 2007 in [place]  . Chronic combined systolic and diastolic heart failure ([name])    a. EF 20-25% by echo in 03/2017 with NST showing prior infarct but no ischemia.   . Diabetes mellitus without complication ([name])   . Hyperlipidemia   . Hypertension   . Pulmonary embolism ([name])   . Stroke [facility]([name])     Assessment: 71 y.o. male admitted with AMS, h/o CVA, to continue Coumadin. INR 3.26>3.34. Fair PO intake.   PTA dose: PTA dose: 11.25 mg on MWF and 7.5 mg AOD's per clinic   Goal of Therapy:  INR 2-3 Monitor platelets by anticoagulation protocol: Yes   Plan:  Warfarin 2.5 mg PO x 1 Daily INR, CBCs F/u s/sx bleeding, PO intake, DDI  Donnella Biyler Reighan Hipolito, PharmD PGY1 Acute Care Pharmacy Resident Pager: [phone] 07/05/2017 1:29 PM

## 2017-07-05 NOTE — Progress Notes (Signed)
[facility] Progress Note  Patient: [name] ZOX:[id number]   PCP: Jaclyn Shaggy, MD DOB: 06-07-1946   DOA: 06/30/2017   DOS: 07/05/2017   Date of Service: the patient was seen and examined on 07/05/2017  Subjective: Patient is symptomatic at the time of my evaluation, later on complained about having shortness of breath as well as was agitated.  Unable to complete the MRI.  Brief hospital course: Pt. with PMH of CAD, chronic systolic CHF, right MCA stroke, type II DM, HTN; admitted on 06/30/2017, presented with complaint of confusion, was found to have UTI and pneumonia. Currently further plan is continue close monitoring in the stepdown unit for now.  Assessment and Plan: 1.  Acute metabolic encephalopathy. Likely from UTI as well as pneumonia. Possibility of other etiology cannot be ruled out. CT scan unremarkable, ABG shows no hypercarbia. Troponin negative EKG negative. No focal deficit on examination right now other than confusion. Already treated with antibiotics with IV Unasyn, changing to oral Augmentin today. EEG completed, will follow up on th shows metabolic encephalopathy, no active seizure focus. Perform MRI to rule out any acute intracranial abnormality. Ammonia level mildly elevated, recheck stable. PRN Haldol, Seroquel.  Gabapentin was hold on admission.  2.  UTI and community-acquired pneumonia. Urine is growing enterococcus sensitive to all antibiotics. There is a question of patient having pneumonia as well. Based on this I will change the patient to oral Augmentin only. Monitor. Blood cultures negative fo any bacterial growth.  3.  Acute on chronic systolic CHF. Echocardiogram shows 20-25% EF with diffuse hypokinesis. Patient was diuresed with IV Lasix. Also on losartan and Aldactone. Appears to have over diuresed and responding to IV fluid. We will continue the fluids for the next 6 hours. Hold Lasix for now.  4.  Multiple PVCs, And  SVT. Monitor electrolytes. Beta-blocker need to be started but not with hypotension right now.  5.  Coronary artery disease. Two-vessel CABG in 2007. Monitor.  6.  Type II DM. Holding Lantus, continue sensitive sliding scale.  7.  Recent right MCA stroke in 2018 S/P mechanical thrombectomy that admission. Continue aspirin and Coumadin.  8.  Chest pain. Troponin negative, EKG negative. Follow serial troponin.  Monitor in stepdown unit.  Diet: As tolerated DVT Prophylaxis: mechanical compression device.  Advance goals of care discussion: full code  Family Communication: family was present at bedside, at the time of interview. The pt provided permission to discuss medical plan with the family. Opportunity was given to ask question and all questions were answered satisfactorily.   Disposition:  Discharge to be determined..  Consultants: none Procedures: EEG  Antibiotics: Anti-infectives (From admission, onward)   Start     Dose/Rate Route Frequency Ordered Stop   07/04/17 1000  amoxicillin-clavulanate (AUGMENTIN) 500-125 MG per tablet 500 mg     1 tablet Oral 3 times daily 07/04/17 0917     07/03/17 1200  Ampicillin-Sulbactam (UNASYN) 3 g in sodium chloride 0.9 % 100 mL IVPB  Status:  Discontinued     3 g 200 mL/hr over 30 Minutes Intravenous Every 8 hours 07/03/17 1159 07/04/17 0917   07/02/17 2000  azithromycin (ZITHROMAX) tablet 250 mg  Status:  Discontinued     250 mg Oral Daily 07/01/17 1050 07/04/17 0911   07/02/17 1830  ciprofloxacin (CIPRO) IVPB 400 mg  Status:  Discontinued     400 mg 200 mL/hr over 60 Minutes Intravenous Every 12 hours 07/02/17 1742 07/03/17 1159   07/01/17 2200  cefTRIAXone (  ROCEPHIN) 1 g in dextrose 5 % 50 mL IVPB  Status:  Discontinued     1 g 100 mL/hr over 30 Minutes Intravenous Every 24 hours 07/01/17 1057 07/03/17 1158   07/01/17 2000  azithromycin (ZITHROMAX) tablet 500 mg     500 mg Oral Daily 07/01/17 1050 07/01/17 2000   07/01/17  0100  cefTRIAXone (ROCEPHIN) 1 g in dextrose 5 % 50 mL IVPB  Status:  Discontinued     1 g 100 mL/hr over 30 Minutes Intravenous Daily at bedtime 07/01/17 0050 07/01/17 0334   07/01/17 0100  azithromycin (ZITHROMAX) tablet 500 mg  Status:  Discontinued     500 mg Oral Daily at bedtime 07/01/17 0050 07/01/17 0334       Objective: Physical Exam: Vitals:   07/05/17 0845 07/05/17 0908 07/05/17 1258 07/05/17 1605  BP:  109/77 128/89 112/85  Pulse:  90 96 95  Resp:    (!) 22  Temp:  (!) 97.5 F (36.4 C) (!) 97.5 F (36.4 C) (!) 97.5 F (36.4 C)  TempSrc:  Oral Oral Oral  SpO2: 99% 98% 99% 96%  Weight:      Height:        Intake/Output Summary (Last 24 hours) at 07/05/2017 1643 Last data filed at 07/05/2017 1100 Gross per 24 hour  Intake 2401.33 ml  Output 175 ml  Net 2226.33 ml   Filed Weights   07/01/17 1706 07/03/17 0549 07/05/17 0607  Weight: 76.7 kg (169 lb) 73.8 kg (162 lb 12.8 oz) 75.5 kg (166 lb 7.2 oz)   General: Alert, Awake and Oriented to Person. Appear in moderate distress, affect flat Eyes: PERRL, Conjunctiva normal ENT: Oral Mucosa clear moist. Neck: no JVD, no Abnormal Mass Or lumps Cardiovascular: S1 and S2 Present, no Murmur, Peripheral Pulses Present Respiratory: normal respiratory effort, Bilateral Air entry equal and Decreased, no use of accessory muscle, Clear to Auscultation, no Crackles, no wheezes Abdomen: Bowel Sound present, Soft and no tenderness, no hernia Skin: no redness, no Rash, no induration Extremities: no Pedal edema, no calf tenderness Neurologic: Grossly no focal neuro deficit. Bilaterally Equal motor strength  Data Reviewed: CBC: Recent Labs  Lab 07/02/17 0714 07/03/17 0523 07/04/17 0352 07/04/17 1301 07/05/17 0727  WBC 6.5 6.7 5.7 5.0 6.0  NEUTROABS 3.1 3.1 3.4 3.2 3.1  HGB 12.2* 11.5* 11.5* 11.2* 12.1*  HCT 33.6* 31.8* 32.6* 31.3* 32.9*  MCV 68.9* 68.7* 68.6* 69.2* 69.4*  PLT 242 242 251 220 233   Basic Metabolic  Panel: Recent Labs  Lab 07/02/17 0714 07/03/17 0523 07/04/17 0352 07/04/17 1301 07/05/17 0216 07/05/17 0727  NA 136 135 134* 135  --  137  K 4.0 3.6 4.0 4.1  --  4.2  CL 102 101 98* 100*  --  104  CO2 23 25 24 24   --  22  GLUCOSE 164* 189* 274* 279*  --  214*  BUN 23* 24* 29* 33*  --  37*  CREATININE 1.11 1.02 1.26* 1.42*  --  1.22  CALCIUM 9.5 9.2 9.5 9.4  --  9.4  MG  --   --  1.9  --  2.2  --     Liver Function Tests: Recent Labs  Lab 06/30/17 1930 07/04/17 1301 07/05/17 0727  AST 21 121* 57*  ALT 23 101* 91*  ALKPHOS 104 210* 213*  BILITOT 1.2 1.1 1.0  PROT 6.4* 6.2* 6.4*  ALBUMIN 3.7 3.6 3.5   No results for input(s): LIPASE, AMYLASE in the last  168 hours. Recent Labs  Lab 06/30/17 1932 07/05/17 0216  AMMONIA 39* 16   Coagulation Profile: Recent Labs  Lab 07/01/17 0231 07/02/17 0714 07/03/17 0523 07/04/17 0352 07/05/17 0216  INR 2.66 2.57 2.75 3.26 3.34   Cardiac Enzymes: Recent Labs  Lab 07/04/17 1330 07/04/17 1853 07/05/17 0216 07/05/17 0727  TROPONINI <0.03 <0.03 <0.03 <0.03   BNP (last 3 results) No results for input(s): PROBNP in the last 8760 hours. CBG: Recent Labs  Lab 07/04/17 1205 07/04/17 1715 07/04/17 2210 07/05/17 0852 07/05/17 1302  GLUCAP 272* 209* 199* 217* 323*   Studies: No results found.  Scheduled Meds: . amoxicillin-clavulanate  1 tablet Oral TID  . aspirin EC  81 mg Oral Daily  . atorvastatin  80 mg Oral q1800  . feeding supplement (ENSURE ENLIVE)  237 mL Oral BID BM  . insulin aspart  0-5 Units Subcutaneous QHS  . insulin aspart  0-9 Units Subcutaneous TID WC  . multivitamin with minerals  1 tablet Oral Daily  . pantoprazole  40 mg Oral Daily  . QUEtiapine  25 mg Oral BID  . warfarin  2.5 mg Oral ONCE-1800  . Warfarin - Pharmacist Dosing Inpatient   Does not apply q1800   Continuous Infusions:  PRN Meds: acetaminophen, haloperidol lactate, LORazepam, methocarbamol, traMADol  Time spent: 35  min  Author: Lynden OxfordPranav Dafne Nield, MD [facility] Pager: [phone] 07/05/2017 4:43 PM  If 7PM-7AM, please contact night-coverage at [email or web address], password [email or web address]

## 2017-07-05 NOTE — Progress Notes (Signed)
Patient's CBGs 199, text MD regarding levels, awaiting orders from provider.

## 2017-07-06 LAB — GLUCOSE, CAPILLARY
GLUCOSE-CAPILLARY: 202 mg/dL — AB (ref 65–99)
Glucose-Capillary: 205 mg/dL — ABNORMAL HIGH (ref 65–99)
Glucose-Capillary: 211 mg/dL — ABNORMAL HIGH (ref 65–99)
Glucose-Capillary: 214 mg/dL — ABNORMAL HIGH (ref 65–99)

## 2017-07-06 LAB — PROTIME-INR
INR: 3.02
Prothrombin Time: 31.1 seconds — ABNORMAL HIGH (ref 11.4–15.2)

## 2017-07-06 LAB — TROPONIN I
TROPONIN I: 0.03 ng/mL — AB (ref <0.03)
Troponin I: 0.03 ng/mL (ref <0.03)

## 2017-07-06 LAB — CULTURE, BLOOD (ROUTINE X 2)
Culture: NO GROWTH
Culture: NO GROWTH
SPECIAL REQUESTS: ADEQUATE
Special Requests: ADEQUATE

## 2017-07-06 LAB — BRAIN NATRIURETIC PEPTIDE: B Natriuretic Peptide: 3837.7 pg/mL — ABNORMAL HIGH (ref 0.0–100.0)

## 2017-07-06 MED ORDER — FUROSEMIDE 20 MG PO TABS
20.0000 mg | ORAL_TABLET | Freq: Every day | ORAL | Status: DC
  Administered 2017-07-06 – 2017-07-07 (×2): 20 mg via ORAL
  Filled 2017-07-06 (×2): qty 1

## 2017-07-06 MED ORDER — WARFARIN SODIUM 5 MG PO TABS
5.0000 mg | ORAL_TABLET | Freq: Once | ORAL | Status: AC
  Administered 2017-07-06: 5 mg via ORAL
  Filled 2017-07-06: qty 1

## 2017-07-06 MED ORDER — FUROSEMIDE 10 MG/ML IJ SOLN
20.0000 mg | Freq: Once | INTRAMUSCULAR | Status: AC
  Administered 2017-07-06: 20 mg via INTRAVENOUS
  Filled 2017-07-06: qty 2

## 2017-07-06 MED ORDER — FUROSEMIDE 10 MG/ML IJ SOLN
40.0000 mg | Freq: Once | INTRAMUSCULAR | Status: AC
  Administered 2017-07-06: 40 mg via INTRAVENOUS
  Filled 2017-07-06: qty 4

## 2017-07-06 MED ORDER — SPIRONOLACTONE 25 MG PO TABS
12.5000 mg | ORAL_TABLET | Freq: Every day | ORAL | Status: DC
  Administered 2017-07-06 – 2017-07-15 (×10): 12.5 mg via ORAL
  Filled 2017-07-06 (×11): qty 1

## 2017-07-06 NOTE — Progress Notes (Signed)
2000 Pt is hunched over in the bed c/o sob per daughter. Pt looks like he hyperventilating and is agitated and restless. Pt stats are 98% on RA HR is 90-110's ST;BBB with frequent PVC's. MD made aware and gave orders for ABG and Chest xray. RN attempted to put o2 on pt and pt would not leave on. Pt maintained pox in the high 90's   2205 X ray and ABG completed. Md made aware and awaiting results from X ray.  [email or web address] MD made aware that chest xray and ABG have resulted. Pt is still having sob and labored at times. Resp are in the 50's at times but not sustained. MD placed ordes to give Lasix 40mg  IV now and obtain an EKG and Labs. Orders followed. Condom cath placed on pt and collect 425ml after lasix was given. Pt's daughter stated that the pt says he is" feeling better".  0400 Pt still appearing sob and labored pox 100% on RA HR 100-110's ST;BBB with PVC's . BP 105/81 (89). MD made aware and gave orders for bi-pap. This was discussed with the daughter and she stated that "he is sleeping now and she does not think he will leave it on but if he gets bad again we can try". Pt is easily aroused and it at baseline. Will continue to monitor pt.

## 2017-07-06 NOTE — Progress Notes (Signed)
Rt at bedside asked for doctor to call concerning order for BIPAP. Rt discussed that this wouldn't be the best plan due to pts moving around in the bed and wont be still, afraid he will pull the mask off. Dr agreed that bipap wasn't the best option at this point. RT things that pt should be intubated to rest and the team can do all the test that they need to run on this pt while down and then extubated him.

## 2017-07-06 NOTE — Progress Notes (Addendum)
[facility] Progress Note  Patient: [name] EAV:[id number]   PCP: Jaclyn ShaggyAmao, Enobong, MD DOB: 16-Nov-1945   DOA: 06/30/2017   DOS: 07/06/2017   Date of Service: the patient was seen and examined on 07/06/2017  Subjective: Last night the patient was short of breath.  Had a chest x-ray, ABG.  Given IV Lasix.  Currently breathing back to baseline.  Brief hospital course: Pt. with PMH of CAD, chronic systolic CHF, right MCA stroke, type II DM, HTN; admitted on 06/30/2017, presented with complaint of confusion, was found to have UTI and pneumonia. Currently further plan is continue close monitoring in the stepdown unit for now.  Assessment and Plan: 1.  Acute metabolic encephalopathy. Likely from UTI as well as pneumonia. Possibility of other etiology cannot be ruled out. CT scan unremarkable, ABG shows no hypercarbia. Troponin negative EKG negative. No focal deficit on examination right now other than confusion. Already treated with antibiotics with IV Unasyn, changing to oral Augmentin.  So far 4-day treatment, will continue for 3 more days. EEG completed, will follow up on th shows metabolic encephalopathy, no active seizure focus. Perform MRI to rule out any acute intracranial abnormality. Ammonia level mildly elevated, recheck stable. PRN Haldol, Seroquel.  Gabapentin was hold on admission.  2.  UTI and community-acquired pneumonia. Urine is growing enterococcus sensitive to all antibiotics. There is a question of patient having pneumonia as well. Based on this I will change the patient to oral Augmentin only.  Continue for 3 more days. Monitor. Blood cultures negative fo any bacterial growth.  3.  Acute on chronic systolic CHF. Echocardiogram shows 20-25% EF with diffuse hypokinesis. Patient was diuresed with IV Lasix. Also on losartan and Aldactone. Become tachypneic last night, chest x-ray shows volume overload.  Started on IV Lasix and oral Aldactone.  I will change him  to oral Lasix and monitor.  Avoiding aggressive diuresis since the patient has potential to become hypotensive.  4.  Multiple PVCs, And SVT. Monitor electrolytes. Beta-blocker need to be started but not with hypotension right now.  5.  Coronary artery disease. Two-vessel CABG in 2007. Monitor.  6.  Type II DM. Holding Lantus, continue sensitive sliding scale.  7.  Recent right MCA stroke in 2018 S/P mechanical thrombectomy that admission. Continue aspirin and Coumadin.  8.  Chest pain. Troponin negative, EKG negative. Follow serial troponin.  Monitor in stepdown unit.  Diet: As tolerated DVT Prophylaxis: mechanical compression device.  Advance goals of care discussion: full code  Family Communication: family was present at bedside, at the time of interview. The pt provided permission to discuss medical plan with the family. Opportunity was given to ask question and all questions were answered satisfactorily.   Disposition:  Discharge to be determined..  Consultants: none Procedures: EEG  Antibiotics: Anti-infectives (From admission, onward)   Start     Dose/Rate Route Frequency Ordered Stop   07/04/17 1000  amoxicillin-clavulanate (AUGMENTIN) 500-125 MG per tablet 500 mg     1 tablet Oral 3 times daily 07/04/17 0917     07/03/17 1200  Ampicillin-Sulbactam (UNASYN) 3 g in sodium chloride 0.9 % 100 mL IVPB  Status:  Discontinued     3 g 200 mL/hr over 30 Minutes Intravenous Every 8 hours 07/03/17 1159 07/04/17 0917   07/02/17 2000  azithromycin (ZITHROMAX) tablet 250 mg  Status:  Discontinued     250 mg Oral Daily 07/01/17 1050 07/04/17 0911   07/02/17 1830  ciprofloxacin (CIPRO) IVPB 400 mg  Status:  Discontinued     400 mg 200 mL/hr over 60 Minutes Intravenous Every 12 hours 07/02/17 1742 07/03/17 1159   07/01/17 2200  cefTRIAXone (ROCEPHIN) 1 g in dextrose 5 % 50 mL IVPB  Status:  Discontinued     1 g 100 mL/hr over 30 Minutes Intravenous Every 24 hours 07/01/17  1057 07/03/17 1158   07/01/17 2000  azithromycin (ZITHROMAX) tablet 500 mg     500 mg Oral Daily 07/01/17 1050 07/01/17 2000   07/01/17 0100  cefTRIAXone (ROCEPHIN) 1 g in dextrose 5 % 50 mL IVPB  Status:  Discontinued     1 g 100 mL/hr over 30 Minutes Intravenous Daily at bedtime 07/01/17 0050 07/01/17 0334   07/01/17 0100  azithromycin (ZITHROMAX) tablet 500 mg  Status:  Discontinued     500 mg Oral Daily at bedtime 07/01/17 0050 07/01/17 0334       Objective: Physical Exam: Vitals:   07/06/17 0351 07/06/17 0711 07/06/17 0830 07/06/17 1100  BP: 105/81   112/70  Pulse: (!) 104 97 100   Resp: (!) 38 20 16 (!) 23  Temp: (!) 97.5 F (36.4 C)   98 F (36.7 C)  TempSrc: Oral   Axillary  SpO2: 99% 100% 100% 98%  Weight:      Height:        Intake/Output Summary (Last 24 hours) at 07/06/2017 1506 Last data filed at 07/06/2017 0400 Gross per 24 hour  Intake 0 ml  Output 1050 ml  Net -1050 ml   Filed Weights   07/01/17 1706 07/03/17 0549 07/05/17 0607  Weight: 76.7 kg (169 lb) 73.8 kg (162 lb 12.8 oz) 75.5 kg (166 lb 7.2 oz)   General: Alert, Awake and Oriented to Person. Appear in moderate distress, affect flat Eyes: PERRL, Conjunctiva normal ENT: Oral Mucosa clear moist. Neck: no JVD, no Abnormal Mass Or lumps Cardiovascular: S1 and S2 Present, no Murmur, Peripheral Pulses Present Respiratory: normal respiratory effort, Bilateral Air entry equal and Decreased, no use of accessory muscle, Clear to Auscultation, no Crackles, no wheezes Abdomen: Bowel Sound present, Soft and no tenderness, no hernia Skin: no redness, no Rash, no induration Extremities: no Pedal edema, no calf tenderness Neurologic: Grossly no focal neuro deficit. Bilaterally Equal motor strength  Data Reviewed: CBC: Recent Labs  Lab 07/02/17 0714 07/03/17 0523 07/04/17 0352 07/04/17 1301 07/05/17 0727  WBC 6.5 6.7 5.7 5.0 6.0  NEUTROABS 3.1 3.1 3.4 3.2 3.1  HGB 12.2* 11.5* 11.5* 11.2* 12.1*  HCT  33.6* 31.8* 32.6* 31.3* 32.9*  MCV 68.9* 68.7* 68.6* 69.2* 69.4*  PLT 242 242 251 220 233   Basic Metabolic Panel: Recent Labs  Lab 07/02/17 0714 07/03/17 0523 07/04/17 0352 07/04/17 1301 07/05/17 0216 07/05/17 0727  NA 136 135 134* 135  --  137  K 4.0 3.6 4.0 4.1  --  4.2  CL 102 101 98* 100*  --  104  CO2 23 25 24 24   --  22  GLUCOSE 164* 189* 274* 279*  --  214*  BUN 23* 24* 29* 33*  --  37*  CREATININE 1.11 1.02 1.26* 1.42*  --  1.22  CALCIUM 9.5 9.2 9.5 9.4  --  9.4  MG  --   --  1.9  --  2.2  --     Liver Function Tests: Recent Labs  Lab 06/30/17 1930 07/04/17 1301 07/05/17 0727  AST 21 121* 57*  ALT 23 101* 91*  ALKPHOS 104 210* 213*  BILITOT 1.2  1.1 1.0  PROT 6.4* 6.2* 6.4*  ALBUMIN 3.7 3.6 3.5   No results for input(s): LIPASE, AMYLASE in the last 168 hours. Recent Labs  Lab 06/30/17 1932 07/05/17 0216  AMMONIA 39* 16   Coagulation Profile: Recent Labs  Lab 07/02/17 0714 07/03/17 0523 07/04/17 0352 07/05/17 0216 07/06/17 0524  INR 2.57 2.75 3.26 3.34 3.02   Cardiac Enzymes: Recent Labs  Lab 07/05/17 0216 07/05/17 0727 07/06/17 0045 07/06/17 0524 07/06/17 1133  TROPONINI <0.03 <0.03 <0.03 0.03* 0.03*   BNP (last 3 results) No results for input(s): PROBNP in the last 8760 hours. CBG: Recent Labs  Lab 07/05/17 1302 07/05/17 1602 07/05/17 2211 07/06/17 0753 07/06/17 1115  GLUCAP 323* 237* 228* 205* 211*   Studies: Dg Chest 2 View  Result Date: 07/06/2017 CLINICAL DATA:  Increased dyspnea, assess for pulmonary edema. EXAM: CHEST  2 VIEW COMPARISON:  07/04/2017 CXR FINDINGS: Lung volumes are slightly lower in appearance however there does appear to be increase in interstitial edema since prior exam with small bilateral pleural effusions, right greater than left. Heart is enlarged but stable. The patient is status post CABG. There is mild aortic atherosclerosis at the arch. Degenerative changes are seen about both shoulders. No acute  osseous abnormality is noted. IMPRESSION: 1. Stable cardiomegaly with aortic atherosclerosis. 2. Interval development of mild to moderate CHF with small bilateral pleural effusions. Electronically Signed   By: Tollie Eth M.D.   On: 07/06/2017 00:21    Scheduled Meds: . amoxicillin-clavulanate  1 tablet Oral TID  . aspirin EC  81 mg Oral Daily  . atorvastatin  80 mg Oral q1800  . feeding supplement (ENSURE ENLIVE)  237 mL Oral BID BM  . furosemide  20 mg Oral Daily  . insulin aspart  0-5 Units Subcutaneous QHS  . insulin aspart  0-9 Units Subcutaneous TID WC  . multivitamin with minerals  1 tablet Oral Daily  . pantoprazole  40 mg Oral Daily  . QUEtiapine  25 mg Oral BID  . spironolactone  12.5 mg Oral Daily  . warfarin  5 mg Oral ONCE-1800  . Warfarin - Pharmacist Dosing Inpatient   Does not apply q1800   Continuous Infusions:  PRN Meds: acetaminophen, haloperidol lactate, LORazepam, methocarbamol, traMADol  Time spent: 35 min  Author: Lynden Oxford, MD [facility] Pager: [phone] 07/06/2017 3:06 PM  If 7PM-7AM, please contact night-coverage at [email or web address], password [email or web address] [email or web address]

## 2017-07-06 NOTE — Progress Notes (Signed)
ANTICOAGULATION CONSULT NOTE Pharmacy Consult for Coumadin Indication: h/o CVA  No Known Allergies  Patient Measurements: Height: 5\' 10"  (177.8 cm) Weight: 166 lb 7.2 oz (75.5 kg) IBW/kg (Calculated) : 73  Vital Signs: Temp: 97.5 F (36.4 C) (11/18 0351) Temp Source: Oral (11/18 0351) BP: 105/81 (11/18 0351) Pulse Rate: 97 (11/18 0711)  Labs: Recent Labs    07/04/17 0352 07/04/17 1301  07/05/17 0216 07/05/17 0727 07/06/17 0045 07/06/17 0524  HGB 11.5* 11.2*  --   --  12.1*  --   --   HCT 32.6* 31.3*  --   --  32.9*  --   --   PLT 251 220  --   --  233  --   --   LABPROT 33.0*  --   --  33.6*  --   --  31.1*  INR 3.26  --   --  3.34  --   --  3.02  CREATININE 1.26* 1.42*  --   --  1.22  --   --   TROPONINI  --   --    < > <0.03 <0.03 <0.03 0.03*   < > = values in this interval not displayed.    Estimated Creatinine Clearance: 58.2 mL/min (by C-G formula based on SCr of 1.22 mg/dL).   Medical History: Past Medical History:  Diagnosis Date  . CAD (coronary artery disease) 2007   CABG 2007 in [place]  . Chronic combined systolic and diastolic heart failure ([name])    a. EF 20-25% by echo in 03/2017 with NST showing prior infarct but no ischemia.   . Diabetes mellitus without complication ([name])   . Hyperlipidemia   . Hypertension   . Pulmonary embolism ([name])   . Stroke [facility]([name])     Assessment: 71 y.o. male admitted with AMS, h/o CVA, to continue Coumadin. INR 3.34>3.02 supratherapeutic. Fair PO intake. Per respiratory note, there were plans for intubation early this morning. I called nurse to confirm, but she said the MD did not think warranted at this time. RN has been crushing meds for the patient. No signs/sx of bleeding documented.   PTA dose: PTA dose: 11.25 mg on MWF and 7.5 mg AOD's per clinic.  Goal of Therapy:  INR 2-3 Monitor platelets by anticoagulation protocol: Yes   Plan:  Warfarin 5 mg PO x 1  Daily INR, CBCs F/u s/sx bleeding, PO intake,  DDI  Donnella Biyler Treson Laura, PharmD PGY1 Acute Care Pharmacy Resident Pager: [phone] 07/06/2017 10:37 AM

## 2017-07-07 ENCOUNTER — Encounter (HOSPITAL_COMMUNITY): Payer: Self-pay | Admitting: Physician Assistant

## 2017-07-07 ENCOUNTER — Inpatient Hospital Stay (HOSPITAL_COMMUNITY): Payer: Self-pay

## 2017-07-07 ENCOUNTER — Ambulatory Visit: Payer: Self-pay | Admitting: Surgery

## 2017-07-07 ENCOUNTER — Ambulatory Visit: Payer: Self-pay | Admitting: Family Medicine

## 2017-07-07 DIAGNOSIS — I361 Nonrheumatic tricuspid (valve) insufficiency: Secondary | ICD-10-CM

## 2017-07-07 DIAGNOSIS — I5023 Acute on chronic systolic (congestive) heart failure: Secondary | ICD-10-CM

## 2017-07-07 LAB — CBC WITH DIFFERENTIAL/PLATELET
BASOS ABS: 0 10*3/uL (ref 0.0–0.1)
Basophils Relative: 0 %
EOS ABS: 0.2 10*3/uL (ref 0.0–0.7)
Eosinophils Relative: 2 %
HCT: 32.4 % — ABNORMAL LOW (ref 39.0–52.0)
HEMOGLOBIN: 11.6 g/dL — AB (ref 13.0–17.0)
LYMPHS ABS: 2.1 10*3/uL (ref 0.7–4.0)
LYMPHS PCT: 27 %
MCH: 24.7 pg — ABNORMAL LOW (ref 26.0–34.0)
MCHC: 35.8 g/dL (ref 30.0–36.0)
MCV: 68.9 fL — ABNORMAL LOW (ref 78.0–100.0)
Monocytes Absolute: 0.8 10*3/uL (ref 0.1–1.0)
Monocytes Relative: 10 %
NEUTROS ABS: 4.8 10*3/uL (ref 1.7–7.7)
Neutrophils Relative %: 61 %
PLATELETS: 232 10*3/uL (ref 150–400)
RBC: 4.7 MIL/uL (ref 4.22–5.81)
RDW: 15 % (ref 11.5–15.5)
WBC: 7.9 10*3/uL (ref 4.0–10.5)

## 2017-07-07 LAB — PROTIME-INR
INR: 2.57
PROTHROMBIN TIME: 27.4 s — AB (ref 11.4–15.2)

## 2017-07-07 LAB — COMPREHENSIVE METABOLIC PANEL
ALBUMIN: 3.4 g/dL — AB (ref 3.5–5.0)
ALT: 54 U/L (ref 17–63)
ANION GAP: 9 (ref 5–15)
AST: 29 U/L (ref 15–41)
Alkaline Phosphatase: 176 U/L — ABNORMAL HIGH (ref 38–126)
BUN: 28 mg/dL — ABNORMAL HIGH (ref 6–20)
CO2: 25 mmol/L (ref 22–32)
CREATININE: 1.04 mg/dL (ref 0.61–1.24)
Calcium: 9.3 mg/dL (ref 8.9–10.3)
Chloride: 106 mmol/L (ref 101–111)
GFR calc Af Amer: >60 mL/min (ref >60)
Glucose, Bld: 166 mg/dL — ABNORMAL HIGH (ref 65–99)
POTASSIUM: 3.7 mmol/L (ref 3.5–5.1)
SODIUM: 140 mmol/L (ref 135–145)
Total Bilirubin: 1.6 mg/dL — ABNORMAL HIGH (ref 0.3–1.2)
Total Protein: 6.1 g/dL — ABNORMAL LOW (ref 6.5–8.1)

## 2017-07-07 LAB — GLUCOSE, CAPILLARY
GLUCOSE-CAPILLARY: 168 mg/dL — AB (ref 65–99)
GLUCOSE-CAPILLARY: 230 mg/dL — AB (ref 65–99)
Glucose-Capillary: 243 mg/dL — ABNORMAL HIGH (ref 65–99)
Glucose-Capillary: 185 mg/dL — ABNORMAL HIGH (ref 65–99)

## 2017-07-07 LAB — ECHOCARDIOGRAM COMPLETE
Height: 70 in
WEIGHTICAEL: 2680.79 [oz_av]

## 2017-07-07 MED ORDER — MILRINONE LACTATE IN DEXTROSE 20-5 MG/100ML-% IV SOLN
0.2500 ug/kg/min | INTRAVENOUS | Status: DC
  Administered 2017-07-07 – 2017-07-08 (×3): 0.25 ug/kg/min via INTRAVENOUS
  Filled 2017-07-07 (×3): qty 100

## 2017-07-07 MED ORDER — WARFARIN SODIUM 7.5 MG PO TABS
7.5000 mg | ORAL_TABLET | Freq: Once | ORAL | Status: AC
  Administered 2017-07-07: 7.5 mg via ORAL
  Filled 2017-07-07: qty 1

## 2017-07-07 MED ORDER — FUROSEMIDE 10 MG/ML IJ SOLN: 40.0000 mg | Freq: Every day | INTRAMUSCULAR | Status: DC

## 2017-07-07 MED ORDER — FUROSEMIDE 10 MG/ML IJ SOLN
40.0000 mg | Freq: Two times a day (BID) | INTRAMUSCULAR | Status: DC
  Administered 2017-07-07 – 2017-07-08 (×3): 40 mg via INTRAVENOUS
  Filled 2017-07-07 (×4): qty 4

## 2017-07-07 NOTE — Progress Notes (Signed)
[facility] Progress Note  Patient: [name] ZOX:[id number]   PCP: Jaclyn ShaggyAmao, Enobong, MD DOB: February 26, 1946   DOA: 06/30/2017   DOS: 07/07/2017   Date of Service: the patient was seen and examined on 07/07/2017  Subjective: Continues to have complaints of shortness of breath as well as occasional chest pain.  No nausea no vomiting no cough.  No fever no chills.  Brief hospital course: Pt. with PMH of CAD, chronic systolic CHF, right MCA stroke, type II DM, HTN; admitted on 06/30/2017, presented with complaint of confusion, was found to have UTI and pneumonia. Currently further plan is continue close monitoring in the stepdown unit for now.  Assessment and Plan: 1.  Acute metabolic encephalopathy. Likely from UTI as well as pneumonia. Possibility of other etiology cannot be ruled out. CT scan unremarkable, ABG shows no hypercarbia. Troponin negative EKG negative. No focal deficit on examination right now other than confusion. Already treated with antibiotics with IV Unasyn, changing to oral Augmentin.  So far 4-day treatment, will continue for 3 more days. EEG completed, will follow up on th shows metabolic encephalopathy, no active seizure focus. Perform MRI to rule out any acute intracranial abnormality.  Still pending notified nurse. Ammonia level mildly elevated, recheck stable. PRN Haldol, Seroquel.  Gabapentin was hold on admission.  2.  UTI and community-acquired pneumonia. Urine is growing enterococcus sensitive to all antibiotics. There is a question of patient having pneumonia as well. Based on this I will change the patient to oral Augmentin only.  Continue for 3 more days. Monitor. Blood cultures negative fo any bacterial growth.  3.  Acute on chronic systolic CHF. Echocardiogram shows 20-25% EF with diffuse hypokinesis. Patient was diuresed with IV Lasix. Also on losartan and Aldactone. Become tachypneic, chest x-ray shows volume overload.  Started on IV Lasix and  oral Aldactone.  Cardiology consulted, currently patient is feeling followed by heart failure team in the hospital.  Started on Primacor.  Appreciate input.  4.  Multiple PVCs, And SVT. Monitor electrolytes. Beta-blocker need to be started but not right now.  5.  Coronary artery disease. Two-vessel CABG in 2007. Monitor.  6.  Type II DM. Holding Lantus, continue sensitive sliding scale.  7.  Recent right MCA stroke in 2018 S/P mechanical thrombectomy that admission. Continue aspirin and Coumadin.  8.  Chest pain. Troponin negative, EKG negative. Follow serial troponin.  Monitor in stepdown unit.  Diet: As tolerated DVT Prophylaxis: mechanical compression device.  Advance goals of care discussion: full code  Family Communication: family was present at bedside, at the time of interview. The pt provided permission to discuss medical plan with the family. Opportunity was given to ask question and all questions were answered satisfactorily.   Disposition:  Discharge to be determined.  Consultants: cardiology  Procedures: EEG, Echocardiogram   Antibiotics: Anti-infectives (From admission, onward)   Start     Dose/Rate Route Frequency Ordered Stop   07/04/17 1000  amoxicillin-clavulanate (AUGMENTIN) 500-125 MG per tablet 500 mg     1 tablet Oral 3 times daily 07/04/17 0917     07/03/17 1200  Ampicillin-Sulbactam (UNASYN) 3 g in sodium chloride 0.9 % 100 mL IVPB  Status:  Discontinued     3 g 200 mL/hr over 30 Minutes Intravenous Every 8 hours 07/03/17 1159 07/04/17 0917   07/02/17 2000  azithromycin (ZITHROMAX) tablet 250 mg  Status:  Discontinued     250 mg Oral Daily 07/01/17 1050 07/04/17 0911   07/02/17 1830  ciprofloxacin (  CIPRO) IVPB 400 mg  Status:  Discontinued     400 mg 200 mL/hr over 60 Minutes Intravenous Every 12 hours 07/02/17 1742 07/03/17 1159   07/01/17 2200  cefTRIAXone (ROCEPHIN) 1 g in dextrose 5 % 50 mL IVPB  Status:  Discontinued     1 g 100 mL/hr  over 30 Minutes Intravenous Every 24 hours 07/01/17 1057 07/03/17 1158   07/01/17 2000  azithromycin (ZITHROMAX) tablet 500 mg     500 mg Oral Daily 07/01/17 1050 07/01/17 2000   07/01/17 0100  cefTRIAXone (ROCEPHIN) 1 g in dextrose 5 % 50 mL IVPB  Status:  Discontinued     1 g 100 mL/hr over 30 Minutes Intravenous Daily at bedtime 07/01/17 0050 07/01/17 0334   07/01/17 0100  azithromycin (ZITHROMAX) tablet 500 mg  Status:  Discontinued     500 mg Oral Daily at bedtime 07/01/17 0050 07/01/17 0334       Objective: Physical Exam: Vitals:   07/07/17 0321 07/07/17 0514 07/07/17 0701 07/07/17 1231  BP: 104/66 103/79 107/80   Pulse: (!) 101 96 96 96  Resp: (!) 28 (!) 27 (!) 31 (!) 24  Temp:  98 F (36.7 C) 98.1 F (36.7 C) 98 F (36.7 C)  TempSrc:  Oral Oral Oral  SpO2: 100% 97% 93% 96%  Weight:  76 kg (167 lb 8.8 oz)    Height:        Intake/Output Summary (Last 24 hours) at 07/07/2017 1528 Last data filed at 07/07/2017 1300 Gross per 24 hour  Intake 230 ml  Output 700 ml  Net -470 ml   Filed Weights   07/03/17 0549 07/05/17 0607 07/07/17 0514  Weight: 73.8 kg (162 lb 12.8 oz) 75.5 kg (166 lb 7.2 oz) 76 kg (167 lb 8.8 oz)   General: Alert, Awake and Oriented to Person. Appear in moderate distress, affect flat Eyes: PERRL, Conjunctiva normal ENT: Oral Mucosa clear moist. Neck: no JVD, no Abnormal Mass Or lumps Cardiovascular: S1 and S2 Present, no Murmur, Peripheral Pulses Present Respiratory: normal respiratory effort, Bilateral Air entry equal and Decreased, no use of accessory muscle, Clear to Auscultation, no Crackles, no wheezes Abdomen: Bowel Sound present, Soft and no tenderness, no hernia Skin: no redness, no Rash, no induration Extremities: no Pedal edema, no calf tenderness Neurologic: Grossly no focal neuro deficit. Bilaterally Equal motor strength  Data Reviewed: CBC: Recent Labs  Lab 07/03/17 0523 07/04/17 0352 07/04/17 1301 07/05/17 0727 07/07/17 0444   WBC 6.7 5.7 5.0 6.0 7.9  NEUTROABS 3.1 3.4 3.2 3.1 4.8  HGB 11.5* 11.5* 11.2* 12.1* 11.6*  HCT 31.8* 32.6* 31.3* 32.9* 32.4*  MCV 68.7* 68.6* 69.2* 69.4* 68.9*  PLT 242 251 220 233 232   Basic Metabolic Panel: Recent Labs  Lab 07/03/17 0523 07/04/17 0352 07/04/17 1301 07/05/17 0216 07/05/17 0727 07/07/17 0444  NA 135 134* 135  --  137 140  K 3.6 4.0 4.1  --  4.2 3.7  CL 101 98* 100*  --  104 106  CO2 25 24 24   --  22 25  GLUCOSE 189* 274* 279*  --  214* 166*  BUN 24* 29* 33*  --  37* 28*  CREATININE 1.02 1.26* 1.42*  --  1.22 1.04  CALCIUM 9.2 9.5 9.4  --  9.4 9.3  MG  --  1.9  --  2.2  --   --     Liver Function Tests: Recent Labs  Lab 06/30/17 1930 07/04/17 1301 07/05/17 [id number]  07/07/17 0444  AST 21 121* 57* 29  ALT 23 101* 91* 54  ALKPHOS 104 210* 213* 176*  BILITOT 1.2 1.1 1.0 1.6*  PROT 6.4* 6.2* 6.4* 6.1*  ALBUMIN 3.7 3.6 3.5 3.4*   No results for input(s): LIPASE, AMYLASE in the last 168 hours. Recent Labs  Lab 06/30/17 1932 07/05/17 0216  AMMONIA 39* 16   Coagulation Profile: Recent Labs  Lab 07/03/17 0523 07/04/17 0352 07/05/17 0216 07/06/17 0524 07/07/17 0444  INR 2.75 3.26 3.34 3.02 2.57   Cardiac Enzymes: Recent Labs  Lab 07/05/17 0216 07/05/17 0727 07/06/17 0045 07/06/17 0524 07/06/17 1133  TROPONINI <0.03 <0.03 <0.03 0.03* 0.03*   BNP (last 3 results) No results for input(s): PROBNP in the last 8760 hours. CBG: Recent Labs  Lab 07/06/17 1115 07/06/17 1617 07/06/17 2118 07/07/17 0748 07/07/17 1144  GLUCAP 211* 202* 214* 168* 230*   Studies: No results found.  Scheduled Meds: . amoxicillin-clavulanate  1 tablet Oral TID  . aspirin EC  81 mg Oral Daily  . atorvastatin  80 mg Oral q1800  . feeding supplement (ENSURE ENLIVE)  237 mL Oral BID BM  . furosemide  40 mg Intravenous BID  . insulin aspart  0-5 Units Subcutaneous QHS  . insulin aspart  0-9 Units Subcutaneous TID WC  . multivitamin with minerals  1 tablet Oral  Daily  . pantoprazole  40 mg Oral Daily  . QUEtiapine  25 mg Oral BID  . spironolactone  12.5 mg Oral Daily  . warfarin  7.5 mg Oral ONCE-1800  . Warfarin - Pharmacist Dosing Inpatient   Does not apply q1800   Continuous Infusions: . milrinone 0.25 mcg/kg/min (07/07/17 1323)   PRN Meds: acetaminophen, haloperidol lactate, LORazepam, methocarbamol, traMADol  Time spent: 35 min  Author: Lynden Oxford, MD [facility] Pager: [phone] 07/07/2017 3:28 PM  If 7PM-7AM, please contact night-coverage at [email or web address], password [email or web address]

## 2017-07-07 NOTE — Consult Note (Signed)
Cardiology Consultation:   Patient ID: [name]; [id number]; 1946-03-11   Admit date: 06/30/2017 Date of Consult: 07/07/2017  Primary Care Provider: Arnoldo Morale, MD Primary Cardiologist: Dr. Stanford Breed  Chief Complaint: confusion  Patient Profile:   [name] is a 71 y.o. male with a hx of CAD (s/p CABG in 2007 in [place]), chronic systolic CHF, thoracic aortic aneurysm 03/2017 (4.2 vs 4.6cm by CTs Aug/Sept 2018), cerebrovascular disease, HTN, HLD, Type 2 DM, stroke (~2016, also 04/2017 with acute R MCA occlusion and R popliteal embolic occlusion, placed on Coumadin), PVCs by prior telemetry who is being seen today for the evaluation of CHF at the request of Dr. Posey Pronto.  History of Present Illness:   He is originally from [place] and speaks [place]. In 2007 he had trouble breathing and needed bypass surgery in [place]. He had a stroke 2-3 years ago due to high blood pressure. Per previous notes, he recalled being told he had a weak heart muscle. He was admitted 8/30-04/20/17 with worsening dyspnea, felt to have acute on chronic combined CHF with EF 20-25%. Prior LVEF not known. PE was ruled out along with MI. He underwent stress test without reversible ischemia. It was felt if symptoms persisted despite medications, he may need a right and left heart cath. He was readmitted 9/6 - 05/05/2017 for acute on chronic combined systolic and diastolic CHF. On the second day of his hospitalization, he developed acute AMS and right facial droop with CODE STROKE being activated and him being found to have an acute right MCA occlusion and right popliteal embolic occlusion. He underwent M1 mechanical thrombectomy along with thrombectomy of the right femoropopliteal artery, right below-the-knee popliteal artery endarterectomy with bovine patch angioplasty, and proximal endarterectomy of the right tibioperoneal trunk and peroneal artery. He was started on heparin->Coumadin. Neurology felt his emboli were c/w  cardioembolic strokes likely due to cardiomyopathy. CT scans that admission demonstrated cerebrovascular disease as well as tthoracic aortic aneurysm (4.6cm by CT angio chest, 4.2cm by CT neck) with recommendation to eval semi-annually. His regimen for cardiomyopathy was titrated. He was noted to have PVCs that admission. He required metolazone for diuresis. He went to CIR. At f/u 05/27/17 he was noted to have low BPs by home health and losartan was decreased. He has also been following at wound clinic for LE ulceration.  History is obtained through help of daughter who speaks [place]. He was admitted 06/30/17 with 11lb weight loss, poor appetite, falling asleep frequently during conversation and worsening confusion. Daughter had felt memory was worsening for the past 2 weeks. She states that after his stroke, he was not this bad - he was apparently still able to hold normal conversations about everyday things. He would become confused after waking up, but was OK during the day, just very, very weak. More recently he's been losing orientation of where he is, or talking about things not pertinent to the conversation such as being at work or people who are not present. CXR showed bibasilar infiltrates concerning for CAP versus CHF. His acute metabolic encephalopathy has been felt secondary to UTI thus far, although has not improved much on antibiotics. CT head x2 nonacute. He was placed on abx and started on IV Lasix as well. He was also initially started on Lopressor for NSVT. However, on 11/15 he was found to be hypotensive into the 80s so BB was stopped. On 11/16 he was progressively less responsive and developed chest pain with BP 43-27M systolic. He was given saline  bolus and started back on IV fluids for concern for overdiuresis. EEG 11/16 was nonspecific. MRI was ordered but he developed agitation so this was not pursued. CXR on 11/17 showed interval development of mild to moderate CHF with small bilateral  pleural effusions. On 07/06/17 he developed worsening dyspnea prompting re-initiation of IV Lasix. Bipap ordered but patient not felt to be candidate due to agitation. Received 20m IV Lasix yesterday with re-initiation of spironolactone; has been written for 463mIV daily. Admit weight stated at 169 (unclear if measured), was 162 on 11/15, up to 167 today. Prior office weight 178lb (was 170s at DC in 04/2017).  Patient falls asleep during exam intermittently, but is easily aroused. When he does speak, however, the patient's daughter says she doesn't really understand what he's talking about (peanuts and going on a car ride). He thinks it's 2012.  Pertinent labs this admission include: - initial procalcitonin negative - troponins - neg on 11/12, neg x 3 on 11/16, then <0.03-0.03-0.03 on 11/18 - BNP 2434 on 11/12, BNP 3837 on 11/18 - D-dimer 0.58 - ammonia negative - transaminitis peak 11/16 with AST 121, ALT 101; abumin 3.4, TBili 1.6 - BUN 28/Cr 1.04 today, - Hgb 11.6 with persistent microcytosis similar to prior, INR therapeutic today.  Past Medical History:  Diagnosis Date  . CAD (coronary artery disease) 2007   [place] . Cerebrovascular disease   . Chronic systolic CHF (congestive heart failure) ([name])    a. EF 20-25% by echo in 03/2017 with NST showing prior infarct but no ischemia. Prior LVEF not known but patient reported history of weak heart.  . Diabetes mellitus without complication ([facility]  . Hyperlipidemia   . Hypertension   . Lower extremity embolism ([facility]   a. 04/2017 s/p thrombectomy of the right femoropopliteal artery, right below-the-knee popliteal artery endarterectomy with bovine patch angioplasty, and proximal endarterectomy of the right tibioperoneal trunk and peroneal artery..  . Pulmonary embolism ([facility]  . PVC's (premature ventricular contractions)   . [facility] [place]   a. ~2016, also 04/2017 with acute R MCA occlusion s/p thrombectomy.  . Thoracic  aortic aneurysm ([facility]   a. 4.2 vs 4.6cm by different CTs Aug/Sept 2018.    Past Surgical History:  Procedure Laterality Date  . CARDIAC SURGERY    . CYSTOSCOPY FLEXIBLE N/A 04/25/2017   Performed by ChConrad BurlingtonMD at [facility] 04/25/2017   Performed by ChConrad BurlingtonMD at [facility]. IR PERCUTANEOUS ART THROMBECTOMY/INFUSION INTRACRANIAL INC DIAG ANGIO  04/25/2017  . PATCH ANGIOPLASTY USING XENOSURE BIOLOGIC PATCH Right 04/25/2017   Performed by ChConrad BurlingtonMD at [email or web address]  . RADIOLOGY WITH ANESTHESIA Code Stroke N/A 04/24/2017   Performed by WaCorrie MckusickDO at [facility]     Inpatient Medications: Scheduled Meds: . amoxicillin-clavulanate  1 tablet Oral TID  . aspirin EC  81 mg Oral Daily  . atorvastatin  80 mg Oral q1800  . feeding supplement (ENSURE ENLIVE)  237 mL Oral BID BM  . furosemide  40 mg Intravenous Daily  . insulin aspart  0-5 Units Subcutaneous QHS  . insulin aspart  0-9 Units Subcutaneous TID WC  . multivitamin with minerals  1 tablet Oral Daily  . pantoprazole  40 mg Oral Daily  . QUEtiapine  25 mg Oral BID  . spironolactone  12.5 mg Oral  Daily  . warfarin  7.5 mg Oral ONCE-1800  . Warfarin - Pharmacist Dosing Inpatient   Does not apply q1800   Continuous Infusions:  PRN Meds: acetaminophen, haloperidol lactate, LORazepam, methocarbamol, traMADol  Home Meds: Prior to Admission medications   Medication Sig Start Date End Date Taking? Authorizing Provider  acetaminophen (TYLENOL) 325 MG tablet Take 2 tablets (650 mg total) by mouth every 4 (four) hours as needed for headache or mild pain. 05/05/17  Yes Lyda Jester M, PA-C  aspirin EC 81 MG EC tablet Take 1 tablet (81 mg total) by mouth daily. 05/05/17  Yes Lyda Jester M, PA-C  atorvastatin (LIPITOR) 80 MG tablet Take 1 tablet (80 mg total) by mouth daily at 6 PM. 05/12/17  Yes Angiulli, Lavon Paganini, PA-C  furosemide (LASIX) 80 MG tablet Take 0.5  tablets (40 mg total) by mouth daily. 05/12/17  Yes Angiulli, Lavon Paganini, PA-C  gabapentin (NEURONTIN) 300 MG capsule Take 1 capsule (300 mg total) by mouth at bedtime. Patient taking differently: Take 200 mg by mouth at bedtime.  06/06/17  Yes Amao, Charlane Ferretti, MD  insulin glargine (LANTUS) 100 unit/mL SOPN Inject 0.1 mLs (10 Units total) into the skin at bedtime. 05/12/17  Yes Angiulli, Lavon Paganini, PA-C  lactulose (CHRONULAC) 10 GM/15ML solution Take 15 mLs (10 g total) by mouth 3 (three) times daily. Patient taking differently: Take 10 g 2 (two) times daily by mouth.  06/06/17  Yes Amao, Enobong, MD  losartan (COZAAR) 25 MG tablet Take 0.5 tablets (12.5 mg total) by mouth daily. 06/02/17  Yes Amao, Charlane Ferretti, MD  NOVOLOG 100 UNIT/ML injection Inject 5 Units into the skin at bedtime. 05/06/17  Yes [provider]  pantoprazole (PROTONIX) 40 MG tablet Take 1 tablet (40 mg total) by mouth daily. 05/12/17  Yes Angiulli, Lavon Paganini, PA-C  spironolactone (ALDACTONE) 12.5 mg TABS tablet Take 12.5 mg by mouth daily.   Yes [provider]  traMADol (ULTRAM) 50 MG tablet Take 1 tablet (50 mg total) by mouth every 12 (twelve) hours as needed for severe pain. 06/02/17  Yes Arnoldo Morale, MD  warfarin (COUMADIN) 7.5 MG tablet Take 1 tablet (7.5 mg total) by mouth daily at 6 PM. 05/12/17  Yes Angiulli, Lavon Paganini, PA-C  Blood Glucose Monitoring Suppl (TRUE METRIX METER) DEVI 1 kit by Does not apply route 4 (four) times daily. 05/12/17   Angiulli, Lavon Paganini, PA-C  glucose blood (TRUE METRIX BLOOD GLUCOSE TEST) test strip Use as instructed 05/12/17   Angiulli, Lavon Paganini, PA-C  TRUEPLUS LANCETS 28G MISC 28 g by Does not apply route 4 (four) times daily. 04/23/17   Brayton Caves, PA-C    Allergies:   No Known Allergies  Social History:   Social History   Socioeconomic History  . Marital status: Married    Spouse name: Not on file  . Number of children: Not on file  . Years of education: Not on file  .  Highest education level: Not on file  Social Needs  . Financial resource strain: Not on file  . Food insecurity - worry: Not on file  . Food insecurity - inability: Not on file  . Transportation needs - medical: Not on file  . Transportation needs - non-medical: Not on file  Occupational History  . Not on file  Tobacco Use  . Smoking status: Never Smoker  . Smokeless tobacco: Never Used  Substance and Sexual Activity  . Alcohol use: No  . Drug use: No  .  Sexual activity: Not on file  Other Topics Concern  . Not on file  Social History Narrative   Pt is from [place] and speaks [place]. He came to the [place] in 02/2017.   He has a daughter and son-in-law that speak [place].     Family History:   The patient's family history includes Hypertension in his mother and sister.  ROS:  Difficult to obtain due to patient's mental status; limited findings as above.  Physical Exam/Data:   Vitals:   07/07/17 0300 07/07/17 0321 07/07/17 0514 07/07/17 0701  BP:  104/66 103/79 107/80  Pulse: 91 (!) 101 96 96  Resp: (!) 27 (!) 28 (!) 27 (!) 31  Temp:   98 F (36.7 C) 98.1 F (36.7 C)  TempSrc:   Oral Oral  SpO2:  100% 97% 93%  Weight:   167 lb 8.8 oz (76 kg)   Height:        Intake/Output Summary (Last 24 hours) at 07/07/2017 1212 Last data filed at 07/07/2017 0900 Gross per 24 hour  Intake 110 ml  Output 700 ml  Net -590 ml   Filed Weights   07/03/17 0549 07/05/17 0607 07/07/17 0514  Weight: 162 lb 12.8 oz (73.8 kg) 166 lb 7.2 oz (75.5 kg) 167 lb 8.8 oz (76 kg)   Body mass index is 24.04 kg/m.  General: Frail appearing elderly African male, with intermittent bouts of hyperventilation Head: Normocephalic, atraumatic, sclera non-icteric, no xanthomas, nares are without discharge.  Neck: Negative for carotid bruits. JVD appears moderately elevated. Lungs: Coarse crackles at bases. No wheezes or rhonchi. Intermittent acceleration of respiratory rate Heart: RRR with S1 S2. Laterally  displaced. +s3 Abdomen: Soft, non-tender, non-distended with normoactive bowel sounds. No hepatomegaly. No rebound/guarding. No obvious abdominal masses. Msk:  Strength and tone appear normal for age. Extremities: No clubbing or cyanosis. No edema.  Distal pedal pulses are 2+ and equal bilaterally. Neuro: Slow response rate, generalized weakness, follows commands, able to move all extremities. Oriented to place/self but not time. Psych: Flat affect.  EKG:  The EKG was personally reviewed and demonstrates sinus tach with occ PVCs, NSIVCD, cannot exclude prior anterio rinfarct, nonspecific TW changes throughout with downsloped TW/ST depression V5-V6 similar to 04/2017  Relevant CV Studies: 2D Echo 04/18/17 Study Conclusions  - Left ventricle: The cavity size was moderately dilated. Wall   thickness was normal. Systolic function was severely reduced. The   estimated ejection fraction was in the range of 20% to 25%. - Aortic valve: There was mild regurgitation. - Mitral valve: There was mild regurgitation.   Laboratory Data:  Chemistry Recent Labs  Lab 07/04/17 1301 07/05/17 0727 07/07/17 0444  NA 135 137 140  K 4.1 4.2 3.7  CL 100* 104 106  CO2 '24 22 25  ' GLUCOSE 279* 214* 166*  BUN 33* 37* 28*  CREATININE 1.42* 1.22 1.04  CALCIUM 9.4 9.4 9.3  GFRNONAA 49* 58* >60  GFRAA 56* >60 >60  ANIONGAP '11 11 9    ' Recent Labs  Lab 07/04/17 1301 07/05/17 0727 07/07/17 0444  PROT 6.2* 6.4* 6.1*  ALBUMIN 3.6 3.5 3.4*  AST 121* 57* 29  ALT 101* 91* 54  ALKPHOS 210* 213* 176*  BILITOT 1.1 1.0 1.6*   Hematology Recent Labs  Lab 07/04/17 1301 07/05/17 0727 07/07/17 0444  WBC 5.0 6.0 7.9  RBC 4.52 4.74 4.70  HGB 11.2* 12.1* 11.6*  HCT 31.3* 32.9* 32.4*  MCV 69.2* 69.4* 68.9*  MCH 24.8* 25.5* 24.7*  MCHC 35.8 36.8* 35.8  RDW 15.0 15.4 15.0  PLT 220 233 232   Cardiac Enzymes Recent Labs  Lab 07/04/17 1853 07/05/17 0216 07/05/17 0727 07/06/17 0045 07/06/17 0524  07/06/17 1133  TROPONINI <0.03 <0.03 <0.03 <0.03 0.03* 0.03*    Recent Labs  Lab 06/30/17 1950 06/30/17 2229  TROPIPOC 0.05 0.02    BNP Recent Labs  Lab 06/30/17 2200 07/06/17 0045  BNP 2,434.3* 3,837.7*    DDimer  Recent Labs  Lab 07/04/17 1330  DDIMER 0.58*    Radiology/Studies:  Dg Chest 2 View  Result Date: 07/06/2017 CLINICAL DATA:  Increased dyspnea, assess for pulmonary edema. EXAM: CHEST  2 VIEW COMPARISON:  07/04/2017 CXR FINDINGS: Lung volumes are slightly lower in appearance however there does appear to be increase in interstitial edema since prior exam with small bilateral pleural effusions, right greater than left. Heart is enlarged but stable. The patient is status post CABG. There is mild aortic atherosclerosis at the arch. Degenerative changes are seen about both shoulders. No acute osseous abnormality is noted. IMPRESSION: 1. Stable cardiomegaly with aortic atherosclerosis. 2. Interval development of mild to moderate CHF with small bilateral pleural effusions. Electronically Signed   By: Ashley Royalty M.D.   On: 07/06/2017 00:21   Ct Head Wo Contrast  Result Date: 07/04/2017 CLINICAL DATA:  Altered mental status EXAM: CT HEAD WITHOUT CONTRAST TECHNIQUE: Contiguous axial images were obtained from the base of the skull through the vertex without intravenous contrast. COMPARISON:  Head CT 06/30/2017 FINDINGS: Brain: No mass lesion, intraparenchymal hemorrhage or extra-axial collection. No evidence of acute cortical infarct. There is periventricular hypoattenuation compatible with chronic microvascular disease. Old right thalamic and left cerebellar small infarcts. Vascular: No hyperdense vessel or unexpected calcification. Skull: Normal visualized skull base, calvarium and extracranial soft tissues. Sinuses/Orbits: No sinus fluid levels or advanced mucosal thickening. No mastoid effusion. Normal orbits. IMPRESSION: Unchanged examination without acute intracranial  abnormality. Electronically Signed   By: Ulyses Jarred M.D.   On: 07/04/2017 14:28   Dg Chest Port 1 View  Result Date: 07/04/2017 CLINICAL DATA:  Shortness of breath. EXAM: PORTABLE CHEST 1 VIEW COMPARISON:  06/30/2017 FINDINGS: External pacemaker overlies the chest. Previous median sternotomy. Chronic cardiomegaly. Bilateral lower lobe atelectasis and/or pneumonia persists. Upper lungs are clear. No effusions. No acute bone finding. IMPRESSION: Persistent abnormal bilateral lower lobe density consistent with atelectasis and/or pneumonia. Electronically Signed   By: Nelson Chimes M.D.   On: 07/04/2017 13:26    Assessment and Plan:   [name] is a 71 y/o M with history of CAD (s/p CABG in 2007 in [place]), chronic systolic CHF (EF 06-26% in 03/2017),, thoracic aortic aneurysm 03/2017 (4.2 vs 4.6cm by CTs Aug/Sept 2018), cerebrovascular disease, HTN, HLD, Type 2 DM, stroke (~2016, also 04/2017 with acute R MCA occlusion and R popliteal embolic occlusion, placed on Coumadin), PVCs by prior telemetry who is being seen today for the evaluation of CHF at the request of Dr. Posey Pronto. He was admitted with weight loss, worsening confusion for several weeks, weakness, dyspnea. With diuresis has had intermittent hypotension.  1. Acute encephalopathy 2. Acute on chronic systolic CHF 3. Intermittent hypotension concerning for low output 4. CAD as above with minimal troponin bump (0.03) 5. H/o embolic stroke and lower extremity embolism in 04/2017  Patient seen and examined with Dr. Haroldine Laws with advanced heart failure team. [name] appears very ill. It is unclear how much of this is directly related to his cardiovascular status but some  of these findings may reflect low output heart failure. Per our discussion, will try a trial of milrinone at 0.69m/kg/min over the next 24-48 hours and follow response and increase Lasix to 437mBID. Would continue workup as you are doing for other causes of his  confusion/encephalopathy. Dr. BeHaroldine Lawsas expressed concern over Mr. SaFretwellrognosis and uncertainty of the degree of recovery that will be seen. Cardiology will follow with you.   For questions or updates, please contact [facility] consult [email or web address] for contact info under Cardiology/STEMI.    Signed, DaCharlie PitterPA-C  07/07/2017 12:12 PM  Patient seen and examined with DaMelina CopaPA-C. We discussed all aspects of the encounter. I agree with the assessment and plan as stated above.   [age]/o male with chronic systolic HF due to severe iCM and recent large MCA stroke. Now readmitted with FTT and confusion. On exam appears very weak. Having intermittent bouts of hyperventilation. BP soft. Echo viewed personally LVEF 10-15% with mild RV HK. Has had thorough w/u for encephalopathy without perdominant etiology.   I suspect low output HF may be playing a role but suspect his decline is likely multifactorial. He is currently not candidate for invasive procedures like a RHC. Will start empiric milrinone at 0.25 mcg/kg/min to see if this helps any. I broached the subject of end-of-life issues with his daughter briefly and she was realistic but  not overly receptive at this point.   We will follow.   DaGlori BickersMD  2:24 PM

## 2017-07-07 NOTE — Progress Notes (Signed)
Inpatient Diabetes Program Recommendations  AACE/ADA: New Consensus Statement on Inpatient Glycemic Control (2015)  Target Ranges:  Prepandial:   less than 140 mg/dL      Peak postprandial:   less than 180 mg/dL (1-2 hours)      Critically ill patients:  140 - 180 mg/dL   Results for [name], [name] (MRN [id number]) as of 07/07/2017 08:08  Ref. Range 07/06/2017 07:53 07/06/2017 11:15 07/06/2017 16:17 07/06/2017 21:18  Glucose-Capillary Latest Ref Range: 65 - 99 mg/dL 132205 (H) 440211 (H) 102202 (H) 214 (H)    Home DM Meds: Lantus 10 units QHS       Novolog  Current Insulin Orders: Novolog Sensitive Correction Scale/ SSI (0-9 units) TID AC + HS       MD- Please consider the following in-hospital insulin adjustments:  1. Start Lantus 5 units QHS (50% total home dose)  2. Increase Novolog SSI to Moderate scale (0-15 units) TID AC + HS     --Will follow patient during hospitalization--  Ambrose FinlandJeannine Johnston Zubair Lofton RN, MSN, CDE Diabetes Coordinator Inpatient Glycemic Control Team Team Pager: [phone] (8a-5p)

## 2017-07-07 NOTE — Progress Notes (Signed)
Physical Therapy Treatment Patient Details Name: [name] MRN: [id number] DOB: March 01, 1946 Today's Date: 07/07/2017    History of Present Illness Pt is a 71 y/o male who presents with AMS ~1 week. He was found to have CAP, UTI, encephalopathy, and acute on chronic CHF. 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    PT Comments    Pt has very poor activity tolerance of sitting and standing EOB.  HR increased to 120s and RR/DOE increased to 40s RR and DOE 3/4 with O2 sats stable on RA.  The patient continues to be very weak and deconditioned.  He continues to remain appropriate for SNF level rehab, although daughter remains hopeful he will get strong enough to d/c home with her.    Follow Up Recommendations  SNF;Supervision/Assistance - 24 hour     Equipment Recommendations  3in1 (PT)    Recommendations for Other Services       Precautions / Restrictions Precautions Precautions: Fall Precaution Comments: Residual L side weakness from CVA in September 2018.     Mobility  Bed Mobility Overal bed mobility: Needs Assistance Bed Mobility: Supine to Sit;Sit to Supine     Supine to sit: Mod assist;HOB elevated Sit to supine: Mod assist   General bed mobility comments: Mod assist to progress legs over EOB and seaparately support trunk to get to sitting.  Assist needed to initiate move to supine and then lift legs in separately.   Transfers Overall transfer level: Needs assistance Equipment used: Rolling walker (2 wheeled) Transfers: Sit to/from Stand Sit to Stand: Mod assist         General transfer comment: One person heavy mod assist to support trunk and stabilize RW during transition to stand.  Stood 3 times EOB once for tolerance, once for peri care, and once to attempt to side step closer [facility] for better positioning.  Each time mod assist.   Ambulation/Gait Ambulation/Gait assistance: Mod assist Ambulation Distance (Feet): 3  Feet Assistive device: Rolling walker (2 wheeled) Gait Pattern/deviations: Step-to pattern     General Gait Details: Very heavy mod assist to side step up to EOB ~3 steps with RW.  Pt leaning to the right heavily and almost falling to the right.  Cues to move his feet.           Balance Overall balance assessment: Needs assistance Sitting-balance support: Feet supported;Bilateral upper extremity supported Sitting balance-Leahy Scale: Poor Sitting balance - Comments: min to mod assist due to right lateral lean.  Cues to sit up tall as he is flexed forward at his trunk with forward head.  Postural control: Right lateral lean Standing balance support: Bilateral upper extremity supported Standing balance-Leahy Scale: Poor Standing balance comment: heavy mod assist with RW, right lateral lean today.                             Cognition Arousal/Alertness: Awake/alert Behavior During Therapy: Restless;Impulsive Overall Cognitive Status: Impaired/Different from baseline                     Current Attention Level: Sustained   Following Commands: Follows one step commands consistently Safety/Judgement: Decreased awareness of safety;Decreased awareness of deficits Awareness: Intellectual Problem Solving: Decreased initiation;Difficulty sequencing;Requires verbal cues;Requires tactile cues General Comments: Seems to recognize his daughter, moves quickly and without awareness that he is weak or off balance.  Leans to the right now in  sitting and in standing which is odd given his stroke affected his left side.       Exercises General Exercises - Lower Extremity Long Arc Quad: AROM;Both;10 reps Hip Flexion/Marching: AROM;AAROM;Right;Left;10 reps Toe Raises: AROM;Both;10 reps    General Comments General comments (skin integrity, edema, etc.): Room was very dark when PT entered.  Pt was asking daughter why I was there so late, daughter informed pt that it was not  late.  I encouraged her to leave the widows open and the lights on even if he is napping so his body would naturally know that it is daytime.      Pertinent Vitals/Pain Pain Assessment: Faces Pain Score: 0-No pain        PT Goals (current goals can now be found in the care plan section) Acute Rehab PT Goals Patient Stated Goal: Pt did not state goals, daughter would like him to get strong enough to go home.  Progress towards PT goals: Progressing toward goals    Frequency    Min 3X/week      PT Plan Current plan remains appropriate       AM-PAC PT "6 Clicks" Daily Activity  Outcome Measure  Difficulty turning over in bed (including adjusting bedclothes, sheets and blankets)?: Unable Difficulty moving from lying on back to sitting on the side of the bed? : Unable Difficulty sitting down on and standing up from a chair with arms (e.g., wheelchair, bedside commode, etc,.)?: Unable Help needed moving to and from a bed to chair (including a wheelchair)?: A Lot Help needed walking in hospital room?: Total Help needed climbing 3-5 steps with a railing? : Total 6 Click Score: 7    End of Session Equipment Utilized During Treatment: Gait belt Activity Tolerance: Patient limited by fatigue Patient left: in bed;with call bell/phone within reach;with bed alarm set;with family/visitor present   PT Visit Diagnosis: Unsteadiness on feet (R26.81);Muscle weakness (generalized) (M62.81);Difficulty in walking, not elsewhere classified (R26.2)     Time: [id number] PT Time Calculation (min) (ACUTE ONLY): 31 min  Charges:  $Therapeutic Activity: 23-37 mins          Vincen Bejar B. Diem Pagnotta, PT, DPT [phone]            07/07/2017, 4:40 PM

## 2017-07-07 NOTE — Progress Notes (Signed)
CBG = 243 on last taken

## 2017-07-07 NOTE — Progress Notes (Signed)
ANTICOAGULATION CONSULT NOTE Pharmacy Consult for Coumadin Indication: h/o CVA  No Known Allergies  Patient Measurements: Height: 5\' 10"  (177.8 cm) Weight: 167 lb 8.8 oz (76 kg) IBW/kg (Calculated) : 73  Vital Signs: Temp: 98.1 F (36.7 C) (11/19 0701) Temp Source: Oral (11/19 0701) BP: 107/80 (11/19 0701) Pulse Rate: 96 (11/19 0701)  Labs: Recent Labs    07/04/17 1301  07/05/17 0216 07/05/17 0727 07/06/17 0045 07/06/17 0524 07/06/17 1133 07/07/17 0444  HGB 11.2*  --   --  12.1*  --   --   --  11.6*  HCT 31.3*  --   --  32.9*  --   --   --  32.4*  PLT 220  --   --  233  --   --   --  232  LABPROT  --   --  33.6*  --   --  31.1*  --  27.4*  INR  --   --  3.34  --   --  3.02  --  2.57  CREATININE 1.42*  --   --  1.22  --   --   --  1.04  TROPONINI  --    < > <0.03 <0.03 <0.03 0.03* 0.03*  --    < > = values in this interval not displayed.    Estimated Creatinine Clearance: 68.2 mL/min (by C-G formula based on SCr of 1.04 mg/dL).   Medical History: Past Medical History:  Diagnosis Date  . CAD (coronary artery disease) 2007   CABG 2007 in [place]  . Chronic combined systolic and diastolic heart failure ([name])    a. EF 20-25% by echo in 03/2017 with NST showing prior infarct but no ischemia.   . Diabetes mellitus without complication ([name])   . Hyperlipidemia   . Hypertension   . Pulmonary embolism ([name])   . Stroke [facility]([name])     Assessment: 71 y.o. male admitted with AMS, h/o CVA, to continue Coumadin. INR up to 3.34 n 11/17 after resuming home dose.  -INR= 2.57 (trend down)  PTA dose: PTA dose: 11.25 mg on MWF and 7.5 mg AOD's per clinic.  Goal of Therapy:  INR 2-3 Monitor platelets by anticoagulation protocol: Yes   Plan:  Warfarin 7.5 mg PO x 1  Daily INR  Harland GermanAndrew Brysin Towery, Pharm D 07/07/2017 10:06 AM

## 2017-07-07 NOTE — Progress Notes (Signed)
  Echocardiogram 2D Echocardiogram has been performed.  Tye SavoyCasey N Raneisha Bress 07/07/2017, 11:32 AM

## 2017-07-08 ENCOUNTER — Inpatient Hospital Stay (HOSPITAL_COMMUNITY): Payer: Self-pay

## 2017-07-08 LAB — CBC
HEMATOCRIT: 32.8 % — AB (ref 39.0–52.0)
HEMOGLOBIN: 11.6 g/dL — AB (ref 13.0–17.0)
MCH: 24.6 pg — AB (ref 26.0–34.0)
MCHC: 35.4 g/dL (ref 30.0–36.0)
MCV: 69.6 fL — ABNORMAL LOW (ref 78.0–100.0)
Platelets: 209 10*3/uL (ref 150–400)
RBC: 4.71 MIL/uL (ref 4.22–5.81)
RDW: 15.2 % (ref 11.5–15.5)
WBC: 8.5 10*3/uL (ref 4.0–10.5)

## 2017-07-08 LAB — BASIC METABOLIC PANEL
Anion gap: 11 (ref 5–15)
BUN: 26 mg/dL — AB (ref 6–20)
CHLORIDE: 104 mmol/L (ref 101–111)
CO2: 24 mmol/L (ref 22–32)
Calcium: 9 mg/dL (ref 8.9–10.3)
Creatinine, Ser: 1.03 mg/dL (ref 0.61–1.24)
GFR calc Af Amer: >60 mL/min (ref >60)
GFR calc non Af Amer: >60 mL/min (ref >60)
Glucose, Bld: 171 mg/dL — ABNORMAL HIGH (ref 65–99)
POTASSIUM: 3.5 mmol/L (ref 3.5–5.1)
Sodium: 139 mmol/L (ref 135–145)

## 2017-07-08 LAB — GLUCOSE, CAPILLARY
Glucose-Capillary: 158 mg/dL — ABNORMAL HIGH (ref 65–99)
Glucose-Capillary: 210 mg/dL — ABNORMAL HIGH (ref 65–99)
Glucose-Capillary: 160 mg/dL — ABNORMAL HIGH (ref 65–99)
Glucose-Capillary: 189 mg/dL — ABNORMAL HIGH (ref 65–99)

## 2017-07-08 LAB — PROTIME-INR
INR: 2.56
Prothrombin Time: 27.3 seconds — ABNORMAL HIGH (ref 11.4–15.2)

## 2017-07-08 LAB — MAGNESIUM: Magnesium: 2 mg/dL (ref 1.7–2.4)

## 2017-07-08 MED ORDER — INSULIN ASPART 100 UNIT/ML ~~LOC~~ SOLN
0.0000 [IU] | Freq: Three times a day (TID) | SUBCUTANEOUS | Status: DC
  Administered 2017-07-08: 3 [IU] via SUBCUTANEOUS
  Administered 2017-07-08: 5 [IU] via SUBCUTANEOUS
  Administered 2017-07-09 (×2): 3 [IU] via SUBCUTANEOUS
  Administered 2017-07-09: 2 [IU] via SUBCUTANEOUS
  Administered 2017-07-10 (×2): 3 [IU] via SUBCUTANEOUS
  Administered 2017-07-10 – 2017-07-11 (×2): 2 [IU] via SUBCUTANEOUS
  Administered 2017-07-11: 5 [IU] via SUBCUTANEOUS
  Administered 2017-07-11 – 2017-07-12 (×2): 3 [IU] via SUBCUTANEOUS
  Administered 2017-07-12: 8 [IU] via SUBCUTANEOUS
  Administered 2017-07-12 – 2017-07-13 (×3): 3 [IU] via SUBCUTANEOUS
  Administered 2017-07-13: 5 [IU] via SUBCUTANEOUS
  Administered 2017-07-14: 3 [IU] via SUBCUTANEOUS
  Administered 2017-07-14: 5 [IU] via SUBCUTANEOUS
  Administered 2017-07-14: 3 [IU] via SUBCUTANEOUS
  Administered 2017-07-15 (×3): 2 [IU] via SUBCUTANEOUS

## 2017-07-08 MED ORDER — WARFARIN SODIUM 7.5 MG PO TABS
7.5000 mg | ORAL_TABLET | Freq: Once | ORAL | Status: AC
  Administered 2017-07-08: 7.5 mg via ORAL
  Filled 2017-07-08: qty 1

## 2017-07-08 MED ORDER — INSULIN GLARGINE 100 UNIT/ML ~~LOC~~ SOLN
5.0000 [IU] | Freq: Every day | SUBCUTANEOUS | Status: DC
  Administered 2017-07-08 – 2017-07-14 (×7): 5 [IU] via SUBCUTANEOUS
  Filled 2017-07-08 (×8): qty 0.05

## 2017-07-08 MED ORDER — INSULIN GLARGINE 100 UNIT/ML ~~LOC~~ SOLN
10.0000 [IU] | Freq: Every day | SUBCUTANEOUS | Status: DC
Start: 2017-07-08 — End: 2017-07-08
  Filled 2017-07-08: qty 0.1

## 2017-07-08 NOTE — Progress Notes (Signed)
ANTICOAGULATION CONSULT NOTE - Follow Up Consult  Pharmacy Consult for: Coumadin Indication: h/o CVA  No Known Allergies  Patient Measurements: Height: 5\' 10"  (177.8 cm) Weight: 164 lb 14.5 oz (74.8 kg) IBW/kg (Calculated) : 73  Vital Signs: Temp: 98.4 F (36.9 C) (11/20 0817) Temp Source: Axillary (11/20 0817) BP: 102/67 (11/20 0817) Pulse Rate: 98 (11/20 0817)  Labs: Recent Labs    07/06/17 0045 07/06/17 0524 07/06/17 1133 07/07/17 0444 07/08/17 0447  HGB  --   --   --  11.6* 11.6*  HCT  --   --   --  32.4* 32.8*  PLT  --   --   --  232 209  LABPROT  --  31.1*  --  27.4* 27.3*  INR  --  3.02  --  2.57 2.56  CREATININE  --   --   --  1.04 1.03  TROPONINI <0.03 0.03* 0.03*  --   --     Estimated Creatinine Clearance: 68.9 mL/min (by C-G formula based on SCr of 1.03 mg/dL).  Assessment: 71yom continues on coumadin for h/o CVA. INR therapeutic at 2.56. CBC stable. No bleeding.  PTA dose: 11.25 mg on MWF and 7.5 mg AOD's per clinic  Goal of Therapy:  INR 2-3 Monitor platelets by anticoagulation protocol: Yes   Plan:  1) Warfarin 7.5mg  tonight 2) Daily INR  Louie CasaJennifer Antwian Santaana, PharmD, BCPS  07/08/2017 9:56 AM

## 2017-07-08 NOTE — Progress Notes (Signed)
Advanced Heart Failure Rounding Note  PCP: Dr. Venetia NightAmao Primary Cardiologist: Dr. Jens Somrenshaw HF: New (Dr. Gala RomneyBensimhon)  Subjective:    Translator present.  Still having pressure in chest and shortness of breath. Though feeling better compared to yesterday. More alert.   Creatinine stable at 1.03. Weight down 3 lbs. Pressures remain soft.   Objective:   Weight Range: 164 lb 14.5 oz (74.8 kg) Body mass index is 23.66 kg/m.   Vital Signs:   Temp:  [97.6 F (36.4 C)-98.4 F (36.9 C)] 98.4 F (36.9 C) (11/20 0817) Pulse Rate:  [96-105] 98 (11/20 0817) Resp:  [21-35] 26 (11/20 0617) BP: (97-113)/(67-77) 102/67 (11/20 0817) SpO2:  [96 %-100 %] 100 % (11/20 0817) Weight:  [164 lb 14.5 oz (74.8 kg)] 164 lb 14.5 oz (74.8 kg) (11/20 0500) Last BM Date: 07/02/17  Weight change: Filed Weights   07/05/17 0607 07/07/17 0514 07/08/17 0500  Weight: 166 lb 7.2 oz (75.5 kg) 167 lb 8.8 oz (76 kg) 164 lb 14.5 oz (74.8 kg)    Intake/Output:   Intake/Output Summary (Last 24 hours) at 07/08/2017 0832 Last data filed at 07/08/2017 0400 Gross per 24 hour  Intake 493.32 ml  Output 1060 ml  Net -566.68 ml      Physical Exam    General:  Chronically ill and fatigued appearing.  HEENT: Normal Neck: Supple. JVP 10 cm . Carotids 2+ bilat; no bruits. No lymphadenopathy or thyromegaly appreciated. Cor: PMI nondisplaced. Regular though tachy, pronounced S3.  Lungs: Clear Abdomen: Soft, nontender, nondistended. No hepatosplenomegaly. No bruits or masses. Good bowel sounds. Extremities: No cyanosis, clubbing, rash, or edema. Wasting of RLE. Eschar on 3rd toe of R foot.  Neuro: Alert & oriented to person and place. Cranial nerves grossly intact. Limited movement R side with muscular wasting.all 4 extremities w/o difficulty. Affect pleasant   Telemetry    Sinus tach 100s, Personally reviewed.   EKG    N/A  Labs    CBC Recent Labs    07/07/17 0444 07/08/17 0447  WBC 7.9 8.5    NEUTROABS 4.8  --   HGB 11.6* 11.6*  HCT 32.4* 32.8*  MCV 68.9* 69.6*  PLT 232 209   Basic Metabolic Panel Recent Labs    91/47/8211/19/18 0444 07/08/17 0447  NA 140 139  K 3.7 3.5  CL 106 104  CO2 25 24  GLUCOSE 166* 171*  BUN 28* 26*  CREATININE 1.04 1.03  CALCIUM 9.3 9.0  MG  --  2.0   Liver Function Tests Recent Labs    07/07/17 0444  AST 29  ALT 54  ALKPHOS 176*  BILITOT 1.6*  PROT 6.1*  ALBUMIN 3.4*   No results for input(s): LIPASE, AMYLASE in the last 72 hours. Cardiac Enzymes Recent Labs    07/06/17 0045 07/06/17 0524 07/06/17 1133  TROPONINI <0.03 0.03* 0.03*    BNP: BNP (last 3 results) Recent Labs    05/27/17 1131 06/30/17 2200 07/06/17 0045  BNP 1,040.4* 2,434.3* 3,837.7*    ProBNP (last 3 results) No results for input(s): PROBNP in the last 8760 hours.   D-Dimer No results for input(s): DDIMER in the last 72 hours. Hemoglobin A1C No results for input(s): HGBA1C in the last 72 hours. Fasting Lipid Panel No results for input(s): CHOL, HDL, LDLCALC, TRIG, CHOLHDL, LDLDIRECT in the last 72 hours. Thyroid Function Tests No results for input(s): TSH, T4TOTAL, T3FREE, THYROIDAB in the last 72 hours.  Invalid input(s): FREET3  Other results:   Imaging  Mr Brain 12Wo Contrast  Result Date: 07/07/2017 CLINICAL DATA:  71 y/o  M; 1 week of altered mental status. EXAM: MRI HEAD WITHOUT CONTRAST TECHNIQUE: Axial DWI, coronal DWI, axial T2 FLAIR propeller, axial gradient echo, axial T2 propeller, and axial T1 FLAIR propeller sequences were acquired. Due to patient's altered mental status no additional images were acquired. COMPARISON:  07/04/2017 CT head.  04/28/2017 MRI head. FINDINGS: Brain: Motion degraded study. No reduced diffusion to suggest acute or early subacute infarction. Stable brain parenchymal volume loss and chronic microvascular ischemic changes. Stable ventricle size. No interval susceptibility hypointensity to indicate  intracranial hemorrhage. No focal mass effect or extra-axial collection. Small chronic lacunar infarctions are present within right putamen, thalamus, and caudate body. Vascular: Normal flow voids. Skull and upper cervical spine: Normal marrow signal. Sinuses/Orbits: Negative. Other: None. IMPRESSION: 1. Extensive motion degradation. 2. No acute intracranial abnormality identified. 3. Stable chronic microvascular ischemic changes and parenchymal volume loss of the brain. Electronically Signed   By: Mitzi HansenLance  Furusawa-Stratton M.D.   On: 07/07/2017 21:38      Medications:     Scheduled Medications: . amoxicillin-clavulanate  1 tablet Oral TID  . aspirin EC  81 mg Oral Daily  . atorvastatin  80 mg Oral q1800  . feeding supplement (ENSURE ENLIVE)  237 mL Oral BID BM  . furosemide  40 mg Intravenous BID  . insulin aspart  0-5 Units Subcutaneous QHS  . insulin aspart  0-9 Units Subcutaneous TID WC  . multivitamin with minerals  1 tablet Oral Daily  . pantoprazole  40 mg Oral Daily  . QUEtiapine  25 mg Oral BID  . spironolactone  12.5 mg Oral Daily  . Warfarin - Pharmacist Dosing Inpatient   Does not apply q1800     Infusions: . milrinone 0.25 mcg/kg/min (07/07/17 2346)     PRN Medications:  acetaminophen, haloperidol lactate, LORazepam, methocarbamol, traMADol    Patient Profile   [name] is a 71 y/o M with history of CAD (s/p CABG in 2007 in [place]), chronic systolic CHF (EF 40-98%20-25% in 03/2017),, thoracic aortic aneurysm 03/2017 (4.2 vs 4.6cm by CTs Aug/Sept 2018), cerebrovascular disease, HTN, HLD, Type 2 DM, stroke (~2016, also 04/2017 with acute R MCA occlusion and R popliteal embolic occlusion, placed on Coumadin), PVCs by prior telemetry who is being seen today for the evaluation of CHF at the request of Dr. Allena KatzPatel.   He was admitted with weight loss, worsening confusion for several weeks, weakness, dyspnea. With diuresis has had intermittent hypotension.  Assessment/Plan    1. Acute encephalopathy - Somewhat improved on milrinone.  - Likely multifactorial with low output and residual neuro deficit from stroke.  2. Acute on chronic systolic CHF  - Echo 07/07/17 LVEF 15-20%, Trivial AI, Severe LAE, Mod RV dilation, Mod/Sev RAE, P peak pressure 40 mmHg. - Volume status remains elevated.  - Continue milrinone 0.25 mcg/kg/min at least today - Give IV lasix 40 mg BID x 2 dose.  3. Intermittent hypotension concerning for low output - Continue to follow on milrinone.  4. CAD as above with minimal troponin bump (0.03) - Mild CP on going. No indication for cath at this time.  5. H/o embolic stroke and lower extremity embolism in 04/2017 - This was very debilitating and he has made very little progress in recovery.   Pt remains tenuous. May be rapidly approaching a palliative situation. Not a good candidate for home milrinone with poor prognosis and poor self care.   Medication  concerns reviewed with patient and pharmacy team. Barriers identified: No insurance on file.   Length of Stay: [address]  Luane School  07/08/2017, 8:32 AM  Advanced Heart Failure Team Pager [phone] (M-F; 7a - 4p)  Please contact [facility] for night-coverage after hours (4p -7a ) and weekends on [email or web address]  Patient seen and examined with the above-signed Advanced Practice Provider and/or Housestaff. I personally reviewed laboratory data, imaging studies and relevant notes. I independently examined the patient and formulated the important aspects of the plan. I have edited the note to reflect any of my changes or salient points. I have personally discussed the plan with the patient and/or family.  He feels some better on milrinone but overall remains end-stage. Will continue milrinone support one more day to say if he will continue to improve. Give IV lasix today.   Spoke with Dr. Allena Katz and we agree on Hospice consult for GOC.  Arvilla Meres, MD  10:06 AM

## 2017-07-08 NOTE — [place] FL2 (Signed)
[place] MEDICAID FL2 LEVEL OF CARE SCREENING TOOL     IDENTIFICATION  Patient Name: [name] Birthdate: 07/13/1946 Sex: male Admission Date (Current Location): 06/30/2017  [facility] [place]   Facility and Address:  [facility] [place], [address], [place], [place] [address]      Provider Number: [id number]  Attending Physician Name and Address:  Rolly SalterPatel, Pranav M, MD  Relative Name and Phone Number:  Fay RecordsSalimata Petrik, daughter, [phone]    Current Level of Care: Hospital Recommended Level of Care: Skilled Nursing Facility Prior Approval Number:    Date Approved/Denied:   PASRR Number: [id number]  Discharge Plan: SNF    Current Diagnoses: Patient Active Problem List   Diagnosis Date Noted  . CAP (community acquired pneumonia) 07/01/2017  . CAD (coronary artery disease) 05/27/2017  . Chronic combined systolic and diastolic heart failure ([name]) 05/27/2017  . Long term (current) use of anticoagulants [Z79.01] 05/16/2017  . Chronic anticoagulation   . Shortness of breath   . Diabetes mellitus type 2 in nonobese ([name])   . Subtherapeutic anticoagulation   . Neuropathic pain   . AKI (acute kidney injury) ([name])   . 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 Coronary artery disease involving coronary bypass graft of native heart without angina pectoris   . Type 2 diabetes mellitus with peripheral neuropathy ([name])   . Benign essential HTN   . Hyperlipidemia   . Acute blood loss anemia   . Anemia of chronic disease   . History of medication noncompliance   . Ischemia of right lower extremity   . Endotracheally intubated   . Ischemia of left lower extremity   . Respiratory failure ([name])   . Occlusion of left carotid artery   . Urinary retention   . Ischemic cardiomyopathy   . S/P CABG (coronary artery bypass graft)   . Cerebral embolism with cerebral infarction 04/25/2017  . Acute on chronic systolic heart  failure ([name]) 84/69/629509/01/2017  . Hyponatremia 04/24/2017  . CHF (congestive heart failure) ([name]) 04/17/2017    Orientation RESPIRATION BLADDER Height & Weight     Self  Normal Continent Weight: 164 lb 14.5 oz (74.8 kg) Height:  5\' 10"  (177.8 cm)  BEHAVIORAL SYMPTOMS/MOOD NEUROLOGICAL BOWEL NUTRITION STATUS      Continent Diet(heart healthy, carb modified; please see DC summary)  AMBULATORY STATUS COMMUNICATION OF NEEDS Skin   Extensive Assist Verbally PU Stage and Appropriate Care(R foot necrotic lesion)                       Personal Care Assistance Level of Assistance  Bathing, Feeding, Dressing Bathing Assistance: Maximum assistance Feeding assistance: Limited assistance Dressing Assistance: Maximum assistance     Functional Limitations Info  Sight, Hearing, Speech Sight Info: Adequate Hearing Info: Adequate Speech Info: Adequate    SPECIAL CARE FACTORS FREQUENCY  PT (By licensed PT)     PT Frequency: 5x/week              Contractures Contractures Info: Not present    Additional Factors Info  Code Status, Allergies, Psychotropic, Insulin Sliding Scale Code Status Info: Full Allergies Info: No Known Allergies Psychotropic Info: seroquel Insulin Sliding Scale Info: insulin 3x/day and at bedtime       Current Medications (07/08/2017):  This is the current hospital active medication list Current Facility-Administered Medications  Medication Dose Route Frequency Provider Last Rate Last Dose  . acetaminophen (TYLENOL) tablet 650  mg  650 mg Oral Q4H PRN Hillary BowGardner, Jared M, DO   650 mg at 07/05/17 0540  . amoxicillin-clavulanate (AUGMENTIN) 500-125 MG per tablet 500 mg  1 tablet Oral TID Rolly SalterPatel, Pranav M, MD   500 mg at 07/07/17 2205  . aspirin EC tablet 81 mg  81 mg Oral Daily Hillary BowGardner, Jared M, DO   81 mg at 07/08/17 [id number]  . atorvastatin (LIPITOR) tablet 80 mg  80 mg Oral q1800 Hillary BowGardner, Jared M, DO   80 mg at 07/07/17 1834  . feeding supplement (ENSURE ENLIVE)  (ENSURE ENLIVE) liquid 237 mL  237 mL Oral BID BM Briant CedarEzenduka, Nkeiruka J, MD   237 mL at 07/08/17 0951  . furosemide (LASIX) injection 40 mg  40 mg Intravenous BID Dunn, Dayna N, PA-C   40 mg at 07/08/17 0951  . haloperidol lactate (HALDOL) injection 2 mg  2 mg Intravenous Q6H PRN Rolly SalterPatel, Pranav M, MD   2 mg at 07/05/17 1553  . insulin aspart (novoLOG) injection 0-15 Units  0-15 Units Subcutaneous TID WC Graciella Freerillery, Michael Andrew, PA-C      . insulin aspart (novoLOG) injection 0-5 Units  0-5 Units Subcutaneous QHS Pearson GrippeKim, James, MD   2 Units at 07/06/17 2137  . insulin glargine (LANTUS) injection 5 Units  5 Units Subcutaneous QHS Graciella Freerillery, Michael Andrew, PA-C      . LORazepam (ATIVAN) injection 0.5 mg  0.5 mg Intravenous Q10 min PRN Rolly SalterPatel, Pranav M, MD   0.5 mg at 07/07/17 1942  . methocarbamol (ROBAXIN) tablet 500 mg  500 mg Oral Q8H PRN Rolly SalterPatel, Pranav M, MD   500 mg at 07/05/17 1225  . milrinone (PRIMACOR) 20 MG/100 ML (0.2 mg/mL) infusion  0.25 mcg/kg/min Intravenous Continuous Dunn, Dayna N, PA-C 5.7 mL/hr at 07/07/17 2346 0.25 mcg/kg/min at 07/07/17 2346  . multivitamin with minerals tablet 1 tablet  1 tablet Oral Daily Briant CedarEzenduka, Nkeiruka J, MD   1 tablet at 07/08/17 0951  . pantoprazole (PROTONIX) EC tablet 40 mg  40 mg Oral Daily Lyda PeroneGardner, Jared M, DO   40 mg at 07/08/17 0951  . QUEtiapine (SEROQUEL) tablet 25 mg  25 mg Oral BID Rolly SalterPatel, Pranav M, MD   25 mg at 07/08/17 0951  . spironolactone (ALDACTONE) tablet 12.5 mg  12.5 mg Oral Daily Pearson GrippeKim, James, MD   12.5 mg at 07/08/17 0951  . traMADol (ULTRAM) tablet 25 mg  25 mg Oral Q6H PRN Rolly SalterPatel, Pranav M, MD      . warfarin (COUMADIN) tablet 7.5 mg  7.5 mg Oral ONCE-1800 Emi HolesMarkle, Jennifer S, [place]      . Warfarin - Pharmacist Dosing Inpatient   Does not apply q1800 Hillary BowGardner, Jared M, DO         Discharge Medications: Please see discharge summary for a list of discharge medications.  Relevant Imaging Results:  Relevant Lab Results:   Additional  Information No SSN  Abigail ButtsSusan Richards Pherigo, LCSW

## 2017-07-08 NOTE — Progress Notes (Signed)
Chart reviewed and patient examined. Family not at bedside.  Setup goals of care meeting for tomorrow morning with daughter.   No charge note.  Gerlean RenShae Lee Ahmari Garton, DNP, AGNP-C Palliative Medicine Team Team Phone # [phone]

## 2017-07-08 NOTE — Progress Notes (Signed)
Physical Therapy Treatment Patient Details Name: [name] MRN: [id number] DOB: 07-07-1946 Today's Date: 07/08/2017    History of Present Illness Pt is a 71 y/o male who presents with AMS ~1 week. He was found to have CAP, UTI, encephalopathy, and acute on chronic CHF. 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    PT Comments    Transitions between tasks were difficult due to language barrier.  Interpretive services not working correctly at time of treatment.  Activity easily related to patient.  Emphasis on sitting balance, standing and walking forward and back with the RW    Follow Up Recommendations  SNF;Supervision/Assistance - 24 hour     Equipment Recommendations  3in1 (PT)    Recommendations for Other Services       Precautions / Restrictions Precautions Precautions: Fall Precaution Comments: Residual L side weakness from CVA in September 2018.     Mobility  Bed Mobility Overal bed mobility: Needs Assistance Bed Mobility: Sidelying to Sit;Sit to Supine   Sidelying to sit: Mod assist   Sit to supine: Mod assist      Transfers Overall transfer level: Needs assistance Equipment used: Rolling walker (2 wheeled) Transfers: Sit to/from Stand Sit to Stand: +2 physical assistance;Mod assist         General transfer comment: tactile cues for hand placement and correct technique.  Ambulation/Gait Ambulation/Gait assistance: Mod assist;+2 physical assistance Ambulation Distance (Feet): 5 Feet(forward and back x3 with RW) Assistive device: Rolling walker (2 wheeled) Gait Pattern/deviations: Step-to pattern   Gait velocity interpretation: Below normal speed for age/gender General Gait Details: L sided truncal support and help with w/shift to ambulate with RW forward and back   Stairs            Wheelchair Mobility    Modified Rankin (Stroke Patients Only)       Balance Overall balance assessment: Needs  assistance Sitting-balance support: Feet supported;Bilateral upper extremity supported;No upper extremity supported Sitting balance-Leahy Scale: Poor(to fair) Sitting balance - Comments: generally min assist for upright posture with some mod as he fatigued.  pt able to sit for seconds with min guard initially Postural control: Right lateral lean   Standing balance-Leahy Scale: Poor Standing balance comment: heavy mod assist with RW,                            Cognition Arousal/Alertness: Awake/alert Behavior During Therapy: Flat affect;WFL for tasks assessed/performed Overall Cognitive Status: Impaired/Different from baseline                     Current Attention Level: Sustained         Problem Solving: Decreased initiation;Difficulty sequencing;Requires verbal cues;Requires tactile cues        Exercises General Exercises - Lower Extremity Long Arc Quad: AROM;Both;5 reps    General Comments        Pertinent Vitals/Pain Pain Assessment: Faces Faces Pain Scale: No hurt    Home Living                      Prior Function            PT Goals (current goals can now be found in the care plan section) Acute Rehab PT Goals Patient Stated Goal: Pt did not state goals, daughter would like him to get strong enough to go home.  Time For Goal Achievement: 07/18/17 Potential to  Achieve Goals: Fair Progress towards PT goals: Progressing toward goals    Frequency    Min 3X/week      PT Plan Current plan remains appropriate    Co-evaluation              AM-PAC PT "6 Clicks" Daily Activity  Outcome Measure  Difficulty turning over in bed (including adjusting bedclothes, sheets and blankets)?: Unable Difficulty moving from lying on back to sitting on the side of the bed? : Unable Difficulty sitting down on and standing up from a chair with arms (e.g., wheelchair, bedside commode, etc,.)?: Unable Help needed moving to and from a bed to  chair (including a wheelchair)?: A Lot Help needed walking in hospital room?: A Lot Help needed climbing 3-5 steps with a railing? : Total 6 Click Score: 8    End of Session   Activity Tolerance: Patient limited by fatigue Patient left: in bed;with call bell/phone within reach;with bed alarm set Nurse Communication: Mobility status PT Visit Diagnosis: Unsteadiness on feet (R26.81);Muscle weakness (generalized) (M62.81);Difficulty in walking, not elsewhere classified (R26.2)     Time: [id number] PT Time Calculation (min) (ACUTE ONLY): 33 min  Charges:  $Gait Training: 8-22 mins $Therapeutic Activity: 8-22 mins                    G Codes:       07/08/2017   [name], PT [phone]  (pager)   [name] 07/08/2017, 5:33 PM

## 2017-07-08 NOTE — Clinical Social Work Note (Signed)
Clinical Social Work Assessment  Patient Details  Name: [name] MRN: [id number] Date of Birth: Mar 24, 1946  Date of referral:  07/08/17               Reason for consult:  Facility Placement                Permission sought to share information with:  Facility Sport and exercise psychologist Permission granted to share information::  No(patient [place] speaking, daughter at bedside)  Name::     Careers information officer::  SNFs  Relationship::  daughter  Contact Information:  [phone]  Housing/Transportation Living arrangements for the past 2 months:  [place] of Information:  Adult Children Patient Interpreter Needed:  [place] Criminal Activity/Legal Involvement Pertinent to Current Situation/Hospitalization:  No - Comment as needed Significant Relationships:  Adult Children, Other Family Members Lives with:  Adult Children Do you feel safe going back to the place where you live?  Yes Need for family participation in patient care:  Yes (Comment)  Care giving concerns: Patient from home with daughter and son-in-law. PT recommending SNF.   Social Worker assessment / plan: CSW met with patient and daughter at bedside. Patient lethargic, fidgeting with bedclothes. CSW discussed disposition planning with daughter. Daughter hopeful patient can return home but agreeable to short term SNF to get patient's strength back up. Daughter provides 24/7 care to patient at home. CSW discussed discharge planning process and possible barriers, including lack of insurance and patient being on milrinone. CSW to follow for plan on milrinone. CSW may be able to obtain LOG SNF bed, but LOG will likely not cover milrinone. CSW to send out initial SNF referrals and follow to support with disposition planning.  Employment status:  Retired Forensic scientist:  Self Pay (Medicaid Pending) PT Recommendations:  [place] / Referral to community resources:  [place]  Patient/Family's Response to care: Daughter appreciative of care.  Patient/Family's Understanding of and Emotional Response to Diagnosis, Current Treatment, and Prognosis: Daughter with good understanding of patient's condition and agreeable to ST SNF. Daughter provides 24/7 care for patient at home if unable to get SNF bed.  Emotional Assessment Appearance:  Appears stated age Attitude/Demeanor/Rapport:  Lethargic Affect (typically observed):  Unable to Assess Orientation:  Oriented to Self, Oriented to Place, Oriented to  Time Alcohol / Substance use:  Not Applicable Psych involvement (Current and /or in the community):  No (Comment)  Discharge Needs  Concerns to be addressed:  Discharge Planning Concerns Readmission within the last 30 days:  No Current discharge risk:  Physical Impairment Barriers to Discharge:  Continued Medical Work up   Estanislado Emms, LCSW 07/08/2017, 10:36 AM

## 2017-07-08 NOTE — Progress Notes (Signed)
Nutrition Follow-up  DOCUMENTATION CODES:   Non-severe (moderate) malnutrition in context of chronic illness  INTERVENTION:    Continue Ensure Enlive po BID, each supplement provides 350 kcal and 20 grams of protein  Continue MVI daily  NUTRITION DIAGNOSIS:   Moderate Malnutrition related to chronic illness as evidenced by moderate fat depletion, moderate muscle depletion, percent weight loss.  Ongoing  GOAL:   Patient will meet greater than or equal to 90% of their needs  Progressing  MONITOR:   PO intake, Supplement acceptance, I & O's, Labs, Weight trends  REASON FOR ASSESSMENT:   Malnutrition Screening Tool    ASSESSMENT:   71 year old male with medical history significant for CAD, systolic CHF, right MCA stroke in September following embolectomy, diabetes, hypertension presents to the ED due to acute metabolic encephalopathy of unknown etiology for the past 1 week  Patient working with PT, unable to speak with him.  Meal completion remains poor overall; patient is consuming 0-80% of meals. He is being offered Ensure Enlive supplement BID between meals.  Labs and medications reviewed. Palliative Care team has been consulted for discussion regarding goals of care.  Labs and medications reviewed.  Diet Order:  Diet heart healthy/carb modified Room service appropriate? Yes; Fluid consistency: Thin  EDUCATION NEEDS:   Education needs have been addressed  Skin:  Skin Assessment: Reviewed RN Assessment  Last BM:  11/20 (type 5)  Height:   Ht Readings from Last 1 Encounters:  07/01/17 5\' 10"  (1.778 m)    Weight:   Wt Readings from Last 1 Encounters:  07/08/17 164 lb 14.5 oz (74.8 kg)    Ideal Body Weight:  75.45 kg  BMI:  Body mass index is 23.66 kg/m.  Estimated Nutritional Needs:   Kcal:  1700-2000 calories  Protein:  96-110 grams  Fluid:  1.7-2L   Joaquin CourtsKimberly Fatumata Kashani, RD, LDN, CNSC Pager [phone] After Hours Pager [phone]

## 2017-07-08 NOTE — Progress Notes (Signed)
[facility] Progress Note  Patient: [name] ZOX:[id number]   PCP: Jaclyn ShaggyAmao, Enobong, MD DOB: 1946/01/27   DOA: 06/30/2017   DOS: 07/08/2017   Date of Service: the patient was seen and examined on 07/08/2017  Subjective: Continues to have shortness of breath.  Remains confused unable to follow commands consistently.  Brief hospital course: Pt. with PMH of CAD, chronic systolic CHF, right MCA stroke, type II DM, HTN; admitted on 06/30/2017, presented with complaint of confusion, was found to have UTI and pneumonia.  So has acute on chronic CHF started on IV milrinone. Currently further plan is continue close monitoring in the stepdown unit for now.  Assessment and Plan: 1.  Acute metabolic encephalopathy. Likely from UTI as well as pneumonia. Symptomatology has been ongoing for last 3-4 weeks per patient's daughter. Possibility of other etiology cannot be ruled out. CT scan unremarkable, ABG shows no hypercarbia. Troponin negative EKG negative. No focal deficit on examination right now other than confusion. Already treated with antibiotics with IV Unasyn, and oral Augmentin.  Will finish 7-day treatment course. EEG completed,shows metabolic encephalopathy, no active seizure focus. MRI rules out any acute intracranial abnormality. Ammonia level mildly elevated, recheck stable. PRN Haldol, Seroquel.  Discontinuing all other psychotropic medications. Gabapentin was initially suspected and is on hold since admission.  2.  UTI and community-acquired pneumonia. Urine is growing enterococcus sensitive to all antibiotics. There is a question of patient having pneumonia as well. Treated with Augmentin. Monitor. Blood cultures negative fo any bacterial growth.  3.  Acute on chronic systolic CHF. Acute hypoxic respiratory failure.  Echocardiogram shows 20-25% EF with diffuse hypokinesis. Patient was diuresed with IV Lasix. Also on losartan and Aldactone. Become tachypneic, chest  x-ray shows volume overload.  Started on IV Lasix and oral Aldactone.  Cardiology consulted, currently patient is followed by heart failure team in the hospital.  Started on Primacor.  Appreciate input. Pt is already on therapeutic anti-coagulation with warfarin with therapeutic INR.  4.  Multiple PVCs, NSVT. Monitor electrolytes. Beta-blocker need to be started but not right now.  5.  Coronary artery disease. Two-vessel CABG in 2007. Monitor.  6.  Type II DM. Holding Lantus, continue sensitive sliding scale.  7.  Recent right MCA stroke in 2018 S/P mechanical thrombectomy that admission. Continue aspirin and Coumadin.  8.  Chest pain. Troponin negative, EKG negative. Follow serial troponin.  Monitor in stepdown unit.  9.  Goals of care discussion. Patient with decompensated CHF, progressively worsening mentation without any reversible cause, recent acute CVA likely the cause of confusion. Patient also has poor p.o. intake and dysphagia. All this combined renders poor prognosis in future and cardiology recommends to consider palliative care approach-hospice.  Discussed with patient's daughter who will discuss with her whole family regarding this.  Palliative care consult placed.  Diet: As tolerated DVT Prophylaxis: Warfarin  Advance goals of care discussion: full code  Family Communication: family was present at bedside, at the time of interview. The pt provided permission to discuss medical plan with the family. Opportunity was given to ask question and all questions were answered satisfactorily.   Disposition:  Discharge to be determined.  Consultants: cardiology  Procedures: EEG, Echocardiogram   Antibiotics: Anti-infectives (From admission, onward)   Start     Dose/Rate Route Frequency Ordered Stop   07/04/17 1000  amoxicillin-clavulanate (AUGMENTIN) 500-125 MG per tablet 500 mg     1 tablet Oral 3 times daily 07/04/17 0917 07/10/17 0959   07/03/17 1200  Ampicillin-Sulbactam (UNASYN) 3 g in sodium chloride 0.9 % 100 mL IVPB  Status:  Discontinued     3 g 200 mL/hr over 30 Minutes Intravenous Every 8 hours 07/03/17 1159 07/04/17 0917   07/02/17 2000  azithromycin (ZITHROMAX) tablet 250 mg  Status:  Discontinued     250 mg Oral Daily 07/01/17 1050 07/04/17 0911   07/02/17 1830  ciprofloxacin (CIPRO) IVPB 400 mg  Status:  Discontinued     400 mg 200 mL/hr over 60 Minutes Intravenous Every 12 hours 07/02/17 1742 07/03/17 1159   07/01/17 2200  cefTRIAXone (ROCEPHIN) 1 g in dextrose 5 % 50 mL IVPB  Status:  Discontinued     1 g 100 mL/hr over 30 Minutes Intravenous Every 24 hours 07/01/17 1057 07/03/17 1158   07/01/17 2000  azithromycin (ZITHROMAX) tablet 500 mg     500 mg Oral Daily 07/01/17 1050 07/01/17 2000   07/01/17 0100  cefTRIAXone (ROCEPHIN) 1 g in dextrose 5 % 50 mL IVPB  Status:  Discontinued     1 g 100 mL/hr over 30 Minutes Intravenous Daily at bedtime 07/01/17 0050 07/01/17 0334   07/01/17 0100  azithromycin (ZITHROMAX) tablet 500 mg  Status:  Discontinued     500 mg Oral Daily at bedtime 07/01/17 0050 07/01/17 0334       Objective: Physical Exam: Vitals:   07/08/17 0617 07/08/17 0817 07/08/17 1206 07/08/17 1624  BP: 103/74 102/67 96/60 96/61   Pulse: (!) 104 98 100 100  Resp: (!) 26   13  Temp: 97.9 F (36.6 C) 98.4 F (36.9 C) 98.2 F (36.8 C) 98 F (36.7 C)  TempSrc: Oral Axillary Axillary Axillary  SpO2: 100% 100% 99% 100%  Weight:      Height:        Intake/Output Summary (Last 24 hours) at 07/08/2017 1705 Last data filed at 07/08/2017 0800 Gross per 24 hour  Intake 443.32 ml  Output 910 ml  Net -466.68 ml   Filed Weights   07/05/17 0607 07/07/17 0514 07/08/17 0500  Weight: 75.5 kg (166 lb 7.2 oz) 76 kg (167 lb 8.8 oz) 74.8 kg (164 lb 14.5 oz)   General: Alert, Awake and Oriented to Person. Appear in moderate distress, affect flat Eyes: PERRL, Conjunctiva normal ENT: Oral Mucosa clear moist. Neck: no  JVD, no Abnormal Mass Or lumps Cardiovascular: S1 and S2 Present, no Murmur, Peripheral Pulses Present Respiratory: normal respiratory effort, Bilateral Air entry equal and Decreased, no use of accessory muscle, Clear to Auscultation, no Crackles, no wheezes Abdomen: Bowel Sound present, Soft and no tenderness, no hernia Skin: no redness, no Rash, no induration Extremities: no Pedal edema, no calf tenderness Neurologic: Grossly no focal neuro deficit. Bilaterally Equal motor strength  Data Reviewed: CBC: Recent Labs  Lab 07/03/17 0523 07/04/17 0352 07/04/17 1301 07/05/17 0727 07/07/17 0444 07/08/17 0447  WBC 6.7 5.7 5.0 6.0 7.9 8.5  NEUTROABS 3.1 3.4 3.2 3.1 4.8  --   HGB 11.5* 11.5* 11.2* 12.1* 11.6* 11.6*  HCT 31.8* 32.6* 31.3* 32.9* 32.4* 32.8*  MCV 68.7* 68.6* 69.2* 69.4* 68.9* 69.6*  PLT 242 251 220 233 232 209   Basic Metabolic Panel: Recent Labs  Lab 07/04/17 0352 07/04/17 1301 07/05/17 0216 07/05/17 0727 07/07/17 0444 07/08/17 0447  NA 134* 135  --  137 140 139  K 4.0 4.1  --  4.2 3.7 3.5  CL 98* 100*  --  104 106 104  CO2 24 24  --  22 25 24  GLUCOSE 274* 279*  --  214* 166* 171*  BUN 29* 33*  --  37* 28* 26*  CREATININE 1.26* 1.42*  --  1.22 1.04 1.03  CALCIUM 9.5 9.4  --  9.4 9.3 9.0  MG 1.9  --  2.2  --   --  2.0    Liver Function Tests: Recent Labs  Lab 07/04/17 1301 07/05/17 0727 07/07/17 0444  AST 121* 57* 29  ALT 101* 91* 54  ALKPHOS 210* 213* 176*  BILITOT 1.1 1.0 1.6*  PROT 6.2* 6.4* 6.1*  ALBUMIN 3.6 3.5 3.4*   No results for input(s): LIPASE, AMYLASE in the last 168 hours. Recent Labs  Lab 07/05/17 0216  AMMONIA 16   Coagulation Profile: Recent Labs  Lab 07/04/17 0352 07/05/17 0216 07/06/17 0524 07/07/17 0444 07/08/17 0447  INR 3.26 3.34 3.02 2.57 2.56   Cardiac Enzymes: Recent Labs  Lab 07/05/17 0216 07/05/17 0727 07/06/17 0045 07/06/17 0524 07/06/17 1133  TROPONINI <0.03 <0.03 <0.03 0.03* 0.03*   BNP (last 3  results) No results for input(s): PROBNP in the last 8760 hours. CBG: Recent Labs  Lab 07/07/17 1710 07/07/17 2206 07/08/17 0748 07/08/17 1105 07/08/17 1622  GLUCAP 243* 185* 158* 210* 160*   Studies: Mr Brain Wo Contrast  Result Date: 07/07/2017 CLINICAL DATA:  71 y/o  M; 1 week of altered mental status. EXAM: MRI HEAD WITHOUT CONTRAST TECHNIQUE: Axial DWI, coronal DWI, axial T2 FLAIR propeller, axial gradient echo, axial T2 propeller, and axial T1 FLAIR propeller sequences were acquired. Due to patient's altered mental status no additional images were acquired. COMPARISON:  07/04/2017 CT head.  04/28/2017 MRI head. FINDINGS: Brain: Motion degraded study. No reduced diffusion to suggest acute or early subacute infarction. Stable brain parenchymal volume loss and chronic microvascular ischemic changes. Stable ventricle size. No interval susceptibility hypointensity to indicate intracranial hemorrhage. No focal mass effect or extra-axial collection. Small chronic lacunar infarctions are present within right putamen, thalamus, and caudate body. Vascular: Normal flow voids. Skull and upper cervical spine: Normal marrow signal. Sinuses/Orbits: Negative. Other: None. IMPRESSION: 1. Extensive motion degradation. 2. No acute intracranial abnormality identified. 3. Stable chronic microvascular ischemic changes and parenchymal volume loss of the brain. Electronically Signed   By: Mitzi Hansen M.D.   On: 07/07/2017 21:38    Scheduled Meds: . amoxicillin-clavulanate  1 tablet Oral TID  . aspirin EC  81 mg Oral Daily  . atorvastatin  80 mg Oral q1800  . feeding supplement (ENSURE ENLIVE)  237 mL Oral BID BM  . furosemide  40 mg Intravenous BID  . insulin aspart  0-15 Units Subcutaneous TID WC  . insulin aspart  0-5 Units Subcutaneous QHS  . insulin glargine  5 Units Subcutaneous QHS  . multivitamin with minerals  1 tablet Oral Daily  . pantoprazole  40 mg Oral Daily  . QUEtiapine  25 mg  Oral BID  . spironolactone  12.5 mg Oral Daily  . warfarin  7.5 mg Oral ONCE-1800  . Warfarin - Pharmacist Dosing Inpatient   Does not apply q1800   Continuous Infusions: . milrinone 0.25 mcg/kg/min (07/07/17 2346)   PRN Meds: acetaminophen, haloperidol lactate, LORazepam, methocarbamol, traMADol  Time spent: 35 min  Author: Lynden Oxford, MD [facility] Pager: [phone] 07/08/2017 5:05 PM  If 7PM-7AM, please contact night-coverage at [email or web address], password [facility]

## 2017-07-08 NOTE — Progress Notes (Signed)
Inpatient Diabetes Program Recommendations  AACE/ADA: New Consensus Statement on Inpatient Glycemic Control (2015)  Target Ranges:  Prepandial:   less than 140 mg/dL      Peak postprandial:   less than 180 mg/dL (1-2 hours)      Critically ill patients:  140 - 180 mg/dL   Results for [name], [name] (MRN [id number]) as of 07/08/2017 09:06  Ref. Range 07/07/2017 07:48 07/07/2017 11:44 07/07/2017 17:10 07/07/2017 22:06  Glucose-Capillary Latest Ref Range: 65 - 99 mg/dL 409168 (H) 811230 (H) 914243 (H) 185 (H)   Results for [name], [name] (MRN [id number]) as of 07/08/2017 09:06  Ref. Range 07/08/2017 07:48  Glucose-Capillary Latest Ref Range: 65 - 99 mg/dL 086158 (H)    Home DM Meds: Lantus 10 units QHS                             Novolog  Current Insulin Orders: Novolog Sensitive Correction Scale/ SSI (0-9 units) TID AC + HS       MD- Please consider the following in-hospital insulin adjustments:  1. Start Lantus 5 units QHS (50% total home dose)  2. Increase Novolog SSI to Moderate scale (0-15 units) TID AC + HS       --Will follow patient during hospitalization--  Ambrose FinlandJeannine Johnston Valma Rotenberg RN, MSN, CDE Diabetes Coordinator Inpatient Glycemic Control Team Team Pager: [phone] (8a-5p)

## 2017-07-09 DIAGNOSIS — Z515 Encounter for palliative care: Secondary | ICD-10-CM

## 2017-07-09 DIAGNOSIS — Z7189 Other specified counseling: Secondary | ICD-10-CM

## 2017-07-09 DIAGNOSIS — G9340 Encephalopathy, unspecified: Secondary | ICD-10-CM

## 2017-07-09 DIAGNOSIS — I509 Heart failure, unspecified: Secondary | ICD-10-CM

## 2017-07-09 LAB — GLUCOSE, CAPILLARY
GLUCOSE-CAPILLARY: 158 mg/dL — AB (ref 65–99)
Glucose-Capillary: 163 mg/dL — ABNORMAL HIGH (ref 65–99)
Glucose-Capillary: 194 mg/dL — ABNORMAL HIGH (ref 65–99)
Glucose-Capillary: 134 mg/dL — ABNORMAL HIGH (ref 65–99)

## 2017-07-09 LAB — BASIC METABOLIC PANEL
ANION GAP: 10 (ref 5–15)
BUN: 25 mg/dL — ABNORMAL HIGH (ref 6–20)
CALCIUM: 9.2 mg/dL (ref 8.9–10.3)
CHLORIDE: 104 mmol/L (ref 101–111)
CO2: 25 mmol/L (ref 22–32)
Creatinine, Ser: 1.12 mg/dL (ref 0.61–1.24)
GFR calc Af Amer: >60 mL/min (ref >60)
GFR calc non Af Amer: >60 mL/min (ref >60)
Glucose, Bld: 156 mg/dL — ABNORMAL HIGH (ref 65–99)
Potassium: 3.7 mmol/L (ref 3.5–5.1)
Sodium: 139 mmol/L (ref 135–145)

## 2017-07-09 LAB — CBC
HEMATOCRIT: 33.6 % — AB (ref 39.0–52.0)
HEMOGLOBIN: 12 g/dL — AB (ref 13.0–17.0)
MCH: 25.2 pg — AB (ref 26.0–34.0)
MCHC: 35.7 g/dL (ref 30.0–36.0)
MCV: 70.4 fL — AB (ref 78.0–100.0)
Platelets: 239 10*3/uL (ref 150–400)
RBC: 4.77 MIL/uL (ref 4.22–5.81)
RDW: 15.5 % (ref 11.5–15.5)
WBC: 9.1 10*3/uL (ref 4.0–10.5)

## 2017-07-09 LAB — PROTIME-INR
INR: 2.7
PROTHROMBIN TIME: 28.5 s — AB (ref 11.4–15.2)

## 2017-07-09 LAB — AMMONIA: AMMONIA: 22 umol/L (ref 9–35)

## 2017-07-09 MED ORDER — MILRINONE LACTATE IN DEXTROSE 20-5 MG/100ML-% IV SOLN
0.1250 ug/kg/min | INTRAVENOUS | Status: DC
  Administered 2017-07-09 (×2): 0.125 ug/kg/min via INTRAVENOUS
  Filled 2017-07-09 (×2): qty 100

## 2017-07-09 MED ORDER — FUROSEMIDE 10 MG/ML IJ SOLN
40.0000 mg | Freq: Two times a day (BID) | INTRAMUSCULAR | Status: AC
  Administered 2017-07-09: 40 mg via INTRAVENOUS
  Filled 2017-07-09: qty 4

## 2017-07-09 MED ORDER — WARFARIN SODIUM 7.5 MG PO TABS
7.5000 mg | ORAL_TABLET | Freq: Once | ORAL | Status: AC
  Administered 2017-07-09: 7.5 mg via ORAL
  Filled 2017-07-09: qty 1

## 2017-07-09 MED ORDER — SALINE SPRAY 0.65 % NA SOLN
1.0000 | NASAL | Status: DC | PRN
  Administered 2017-07-09: 1 via NASAL
  Filled 2017-07-09: qty 44

## 2017-07-09 NOTE — Progress Notes (Signed)
ANTICOAGULATION CONSULT NOTE - Follow Up Consult  Pharmacy Consult for: Coumadin Indication: h/o CVA  No Known Allergies  Patient Measurements: Height: 5\' 10"  (177.8 cm) Weight: 160 lb 9.6 oz (72.8 kg) IBW/kg (Calculated) : 73  Vital Signs: Temp: 97.6 F (36.4 C) (11/21 1100) Temp Source: Axillary (11/21 1100) BP: 105/67 (11/21 1100) Pulse Rate: 101 (11/21 1100)  Labs: Recent Labs    07/07/17 0444 07/08/17 0447 07/09/17 0409  HGB 11.6* 11.6* 12.0*  HCT 32.4* 32.8* 33.6*  PLT 232 209 239  LABPROT 27.4* 27.3* 28.5*  INR 2.57 2.56 2.70  CREATININE 1.04 1.03 1.12    Estimated Creatinine Clearance: 63.2 mL/min (by C-G formula based on SCr of 1.12 mg/dL).  Assessment: 71yom continues on coumadin for h/o CVA. INR therapeutic at 2.7. CBC stable. No bleeding.  PTA dose: 11.25 mg on MWF and 7.5 mg AOD's per clinic  Goal of Therapy:  INR 2-3 Monitor platelets by anticoagulation protocol: Yes   Plan:  1) Warfarin 7.5mg  tonight 2) Daily INR  Louie CasaJennifer Elye Harmsen, PharmD, BCPS  07/09/2017 12:37 PM

## 2017-07-09 NOTE — Progress Notes (Signed)
[facility] Progress Note  Patient: [name] WUJ:[id number]   PCP: Jaclyn ShaggyAmao, Enobong, MD DOB: 09-13-1945   DOA: 06/30/2017   DOS: 07/09/2017   Date of Service: the patient was seen and examined on 07/09/2017  Subjective: continue to be SOB, with intermittent confusion and restless. On oxygen supplementation and milrinone drip.  Brief hospital course: Pt. with PMH of CAD, chronic systolic CHF, right MCA stroke, type II DM, HTN; admitted on 06/30/2017, presented with complaint of confusion, was found to have UTI and pneumonia.  So has acute on chronic CHF started on IV milrinone. Currently further plan is continue close monitoring in the stepdown unit for now.  Assessment and Plan: 1.  Acute metabolic encephalopathy. -Likely from UTI as well as pneumonia. Also decrease perfusion in setting of worsening heart failure. -Symptomatology has been ongoing for last 3-4 weeks per patient's daughter. -CT scan unremarkable, ABG shows no hypercarbia. -Troponin negative EKG negative. -No new focal deficit on examination right now other than confusion. -Already treated with antibiotics with IV Unasyn, and oral Augmentin. No fever -EEG completed,shows metabolic encephalopathy, no active seizure focus. -MRI rules out any acute intracranial abnormality. -Ammonia level mildly elevated, recheck stable. -PRN Haldol, Seroquel.  Discontinuing all other psychotropic medications. -continue holding gabapentin  2.  UTI and community-acquired pneumonia. -Urine with enterococcus sensitive to all antibiotics. -There was a question of patient having pneumonia as well (concerns for aspiration). -treated with unasyn and then Augmentin. -Blood cultures negative fo any bacterial growth. -patient afebrile  3.  Acute on chronic systolic CHF/Acute hypoxic respiratory failure. -Echocardiogram shows 15-20% EF with diffuse hypokinesis. -Patient was diuresed with IV Lasix. W/o significant improvement. -Cardiology  consulted, currently patient is followed by heart failure team in the hospital.  Started on Primacor and diuretics -Appreciate input and further rec's. -Pt is already on therapeutic anti-coagulation with warfarin with therapeutic INR. -continue oxygen supplementation.  4.  Multiple PVCs, -NSVT. -Monitor electrolytes and replete as needed. -once BP and CHF more stable, probably will benefit of resuming B-blcoker  5.  Coronary artery disease. -Two-vessel CABG in 2007. -Monitor on telemetry. -No chest pain  6.  Type II DM. -poor appetite -continue SSI  7.  Recent right MCA stroke in 2018 -S/P mechanical thrombectomy on last admission. -Continue aspirin and Coumadin for secondary prevention  8.  Chest pain. -Most likely demand ischemia in the presence of CHF exacerbation  9.  Goals of care discussion. -Patient with decompensated CHF, progressively worsening mentation without any reversible cause, recent acute CVA. -Patient also with poor p.o. intake and dysphagia 2. -daughter realizing bad shape of her dad's health; palliative care on board and helping with GOC. -further info/prognosis to be determine base on his response off milrinone drip.   DVT Prophylaxis: on Warfarin  Advance goals of care discussion: Still full code  Family Communication: Daughter at bedside  Disposition:  Weaning milrinone drip over the next 24 hours or so, follow further recommendations by cardiology and assess patient clinical response.  Potential discharge outcomes will depend on his stability (and other residential hospice versus skilled nursing facility with palliative care).  Consultants: cardiology  Procedures: EEG, Echocardiogram   Antibiotics: Anti-infectives (From admission, onward)   Start     Dose/Rate Route Frequency Ordered Stop   07/04/17 1000  amoxicillin-clavulanate (AUGMENTIN) 500-125 MG per tablet 500 mg     1 tablet Oral 3 times daily 07/04/17 0917 07/10/17 0959   07/03/17 1200   Ampicillin-Sulbactam (UNASYN) 3 g in sodium chloride 0.9 %  100 mL IVPB  Status:  Discontinued     3 g 200 mL/hr over 30 Minutes Intravenous Every 8 hours 07/03/17 1159 07/04/17 0917   07/02/17 2000  azithromycin (ZITHROMAX) tablet 250 mg  Status:  Discontinued     250 mg Oral Daily 07/01/17 1050 07/04/17 0911   07/02/17 1830  ciprofloxacin (CIPRO) IVPB 400 mg  Status:  Discontinued     400 mg 200 mL/hr over 60 Minutes Intravenous Every 12 hours 07/02/17 1742 07/03/17 1159   07/01/17 2200  cefTRIAXone (ROCEPHIN) 1 g in dextrose 5 % 50 mL IVPB  Status:  Discontinued     1 g 100 mL/hr over 30 Minutes Intravenous Every 24 hours 07/01/17 1057 07/03/17 1158   07/01/17 2000  azithromycin (ZITHROMAX) tablet 500 mg     500 mg Oral Daily 07/01/17 1050 07/01/17 2000   07/01/17 0100  cefTRIAXone (ROCEPHIN) 1 g in dextrose 5 % 50 mL IVPB  Status:  Discontinued     1 g 100 mL/hr over 30 Minutes Intravenous Daily at bedtime 07/01/17 0050 07/01/17 0334   07/01/17 0100  azithromycin (ZITHROMAX) tablet 500 mg  Status:  Discontinued     500 mg Oral Daily at bedtime 07/01/17 0050 07/01/17 0334       Objective: Physical Exam: Vitals:   07/09/17 1400 07/09/17 1500 07/09/17 1600 07/09/17 1634  BP:    93/68  Pulse:    94  Resp: (!) 22 14 10  (!) 22  Temp:    98.2 F (36.8 C)  TempSrc:    Oral  SpO2:    94%  Weight:      Height:        Intake/Output Summary (Last 24 hours) at 07/09/2017 1715 Last data filed at 07/09/2017 1606 Gross per 24 hour  Intake 329.16 ml  Output 950 ml  Net -620.84 ml   Filed Weights   07/07/17 0514 07/08/17 0500 07/09/17 0344  Weight: 76 kg (167 lb 8.8 oz) 74.8 kg (164 lb 14.5 oz) 72.8 kg (160 lb 9.6 oz)   General: Afebrile, intermittently confused oriented only to person.  Patient with mild respiratory distress.  Very restless Eyes: PERRLA, no icterus, no nystagmus.   Cardiovascular: Regular rate and rhythm, no murmurs, no gallops, mild elevated JVD on  exam. Respiratory: Tachypnea appreciated on exam, decreased breath sounds at the bases bilaterally, no wheezing, positive scattered rhonchi. Abdomen: Soft, nontender, nondistended, positive bowel sounds. Extremities: No edema, no cyanosis, no clubbing.  Patient with limited movement on the right side of his body with muscular wasting.  Data Reviewed: CBC: Recent Labs  Lab 07/03/17 0523 07/04/17 0352 07/04/17 1301 07/05/17 0727 07/07/17 0444 07/08/17 0447 07/09/17 0409  WBC 6.7 5.7 5.0 6.0 7.9 8.5 9.1  NEUTROABS 3.1 3.4 3.2 3.1 4.8  --   --   HGB 11.5* 11.5* 11.2* 12.1* 11.6* 11.6* 12.0*  HCT 31.8* 32.6* 31.3* 32.9* 32.4* 32.8* 33.6*  MCV 68.7* 68.6* 69.2* 69.4* 68.9* 69.6* 70.4*  PLT 242 251 220 233 232 209 239   Basic Metabolic Panel: Recent Labs  Lab 07/04/17 0352 07/04/17 1301 07/05/17 0216 07/05/17 0727 07/07/17 0444 07/08/17 0447 07/09/17 0409  NA 134* 135  --  137 140 139 139  K 4.0 4.1  --  4.2 3.7 3.5 3.7  CL 98* 100*  --  104 106 104 104  CO2 24 24  --  22 25 24 25   GLUCOSE 274* 279*  --  214* 166* 171* 156*  BUN 29*  33*  --  37* 28* 26* 25*  CREATININE 1.26* 1.42*  --  1.22 1.04 1.03 1.12  CALCIUM 9.5 9.4  --  9.4 9.3 9.0 9.2  MG 1.9  --  2.2  --   --  2.0  --     Liver Function Tests: Recent Labs  Lab 07/04/17 1301 07/05/17 0727 07/07/17 0444  AST 121* 57* 29  ALT 101* 91* 54  ALKPHOS 210* 213* 176*  BILITOT 1.1 1.0 1.6*  PROT 6.2* 6.4* 6.1*  ALBUMIN 3.6 3.5 3.4*    Recent Labs  Lab 07/05/17 0216 07/09/17 0409  AMMONIA 16 22   Coagulation Profile: Recent Labs  Lab 07/05/17 0216 07/06/17 0524 07/07/17 0444 07/08/17 0447 07/09/17 0409  INR 3.34 3.02 2.57 2.56 2.70   Cardiac Enzymes: Recent Labs  Lab 07/05/17 0216 07/05/17 0727 07/06/17 0045 07/06/17 0524 07/06/17 1133  TROPONINI <0.03 <0.03 <0.03 0.03* 0.03*   CBG: Recent Labs  Lab 07/08/17 1622 07/08/17 2245 07/09/17 0733 07/09/17 1118 07/09/17 1531  GLUCAP 160*  189* 163* 194* 134*   Studies: Dg Chest Port 1 View  Result Date: 07/08/2017 CLINICAL DATA:  Shortness of breath EXAM: PORTABLE CHEST 1 VIEW COMPARISON:  07/05/2017 FINDINGS: Obscuration of both hemidiaphragms with mild enlargement of the cardiopericardial silhouette. Prior median sternotomy. Atherosclerotic calcification of the aortic arch. IMPRESSION: 1. Indistinct obscuration of both hemidiaphragms potentially from her space opacities or layering pleural effusions. Relative clearing of the upper lobes compared to 07/05/17. 2. Stable enlargement of the cardiopericardial silhouette. 3.  Aortic Atherosclerosis (ICD10-I70.0). Electronically Signed   By: Gaylyn RongWalter  Liebkemann M.D.   On: 07/08/2017 19:15    Scheduled Meds: . amoxicillin-clavulanate  1 tablet Oral TID  . aspirin EC  81 mg Oral Daily  . atorvastatin  80 mg Oral q1800  . feeding supplement (ENSURE ENLIVE)  237 mL Oral BID BM  . insulin aspart  0-15 Units Subcutaneous TID WC  . insulin aspart  0-5 Units Subcutaneous QHS  . insulin glargine  5 Units Subcutaneous QHS  . multivitamin with minerals  1 tablet Oral Daily  . pantoprazole  40 mg Oral Daily  . QUEtiapine  25 mg Oral BID  . spironolactone  12.5 mg Oral Daily  . warfarin  7.5 mg Oral ONCE-1800  . Warfarin - Pharmacist Dosing Inpatient   Does not apply q1800   Continuous Infusions: . milrinone 0.125 mcg/kg/min (07/09/17 1627)   PRN Meds: acetaminophen, haloperidol lactate, sodium chloride  Time spent: 25 min  Author: Vassie Lollarlos Kabella Cassidy, MD [facility] Pager: [phone] 07/09/2017 5:15 PM  If 7PM-7AM, please contact night-coverage at [email or web address], password [email or web address]

## 2017-07-09 NOTE — Evaluation (Signed)
Clinical/Bedside Swallow Evaluation Patient Details  Name: [name] MRN: [id number] Date of Birth: 07-16-1946  Today's Date: 07/09/2017 Time: SLP Start Time (ACUTE ONLY): 1123 SLP Stop Time (ACUTE ONLY): 1138 SLP Time Calculation (min) (ACUTE ONLY): 15 min  Past Medical History:  Past Medical History:  Diagnosis Date  . CAD (coronary artery disease) 2007   a. CABG 2007 in [place].  . Cerebrovascular disease   . Chronic systolic CHF (congestive heart failure) ([name])    a. EF 20-25% by echo in 03/2017 with NST showing prior infarct but no ischemia. Prior LVEF not known but patient reported history of weak heart.  . Diabetes mellitus without complication ([name])   . Hyperlipidemia   . Hypertension   . Lower extremity embolism ([name])    a. 04/2017 s/p thrombectomy of the right femoropopliteal artery, right below-the-knee popliteal artery endarterectomy with bovine patch angioplasty, and proximal endarterectomy of the right tibioperoneal trunk and peroneal artery..  . Pulmonary embolism ([name])   . PVC's (premature ventricular contractions)   . Stroke [facility]([name])    a. ~2016, also 04/2017 with acute R MCA occlusion s/p thrombectomy.  . Thoracic aortic aneurysm ([name])    a. 4.2 vs 4.6cm by different CTs Aug/Sept 2018.   Past Surgical History:  Past Surgical History:  Procedure Laterality Date  . CARDIAC SURGERY    . CYSTOSCOPY N/A 04/25/2017   Procedure: CYSTOSCOPY FLEXIBLE;  Surgeon: Fransisco Hertzhen, Brian L, MD;  [facility];  Service: Vascular;  Laterality: N/A;  . EMBOLECTOMY Right 04/25/2017   Procedure: EMBOLECTOMY RIGHT FEMORAL ARTERY;  Surgeon: Fransisco Hertzhen, Brian L, MD;  [facility];  Service: Vascular;  Laterality: Right;  . IR PERCUTANEOUS ART THROMBECTOMY/INFUSION INTRACRANIAL INC DIAG ANGIO  04/25/2017  . PATCH ANGIOPLASTY Right 04/25/2017   Procedure: PATCH ANGIOPLASTY USING Livia SnellenXENOSURE BIOLOGIC PATCH;  Surgeon: Fransisco Hertzhen, Brian L, MD;  [facility];  Service: Vascular;  Laterality: Right;  . PROSTATE  SURGERY  2002  . RADIOLOGY WITH ANESTHESIA N/A 04/24/2017   Procedure: RADIOLOGY WITH ANESTHESIA Code Stroke;  Surgeon: Gilmer MorWagner, Jaime, DO;  Location: [facility] OR;  Service: Anesthesiology;  Laterality: N/A;   HPI:  Pt is a 71 y/o male who presents with AMS ~1 week. He was found to have CAP, UTI, encephalopathy, and acute on chronic CHF. 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 CXR indistinct obscuration of both hemidiaphragms potentially from her space opacities or layering pleural effusions. Relative clearing of the upper lobes compared to 07/05/17.   Assessment / Plan / Recommendation Clinical Impression  Pt's sister present and denied pt coughing during or after meals however difficulty masticating meats with frequent expectoration. No s/s aspiration however pt's lethargy and overall declining medical status increases risk. SLP downgrading texture to Dys 2, continue thin via cup or straw, crush meds. Will follow up for tolerance and efficiency with recommendations.  SLP Visit Diagnosis: Dysphagia, oral phase (R13.11)    Aspiration Risk  Moderate aspiration risk    Diet Recommendation Dysphagia 2 (Fine chop);Thin liquid   Liquid Administration via: Cup;Straw Medication Administration: Crushed with puree Supervision: Staff to assist with self feeding;Full supervision/cueing for compensatory strategies Compensations: Slow rate;Small sips/bites;Minimize environmental distractions Postural Changes: Seated upright at 90 degrees    Other  Recommendations Oral Care Recommendations: Oral care BID   Follow up Recommendations None      Frequency and Duration min 2x/week  2 weeks       Prognosis Prognosis for Safe Diet Advancement: Fair  Barriers to Reach Goals: Cognitive deficits      Swallow Study   General HPI: Pt is a 71 y/o male who presents with AMS ~1 week. He was found to have CAP, UTI, encephalopathy, and acute on chronic CHF. PMH significant for CAD,  systolic CHF, R MCA stroke in September, DM, HTN. CXR indistinct obscuration of both hemidiaphragms potentially from her space opacities or layering pleural effusions. Relative clearing of the upper lobes compared to 07/05/17. Type of Study: Bedside Swallow Evaluation Previous Swallow Assessment: (see HPI) Diet Prior to this Study: Regular;Thin liquids Temperature Spikes Noted: No Respiratory Status: Nasal cannula History of Recent Intubation: No Behavior/Cognition: Requires cueing;Lethargic/Drowsy;Cooperative Oral Cavity Assessment: Within Functional Limits Oral Care Completed by SLP: No Oral Cavity - Dentition: Adequate natural dentition Vision: (?) Self-Feeding Abilities: Needs set up;Needs assist;Able to feed self Patient Positioning: Upright in bed Baseline Vocal Quality: Low vocal intensity Volitional Cough: Weak Volitional Swallow: Unable to elicit    Oral/Motor/Sensory Function Overall Oral Motor/Sensory Function: Generalized oral weakness   Ice Chips Ice chips: Not tested   Thin Liquid Thin Liquid: Within functional limits Presentation: Cup;Straw    Nectar Thick Nectar Thick Liquid: Not tested   Honey Thick Honey Thick Liquid: Not tested   Puree Puree: Within functional limits   Solid   GO   Solid: Impaired Oral Phase Impairments: Reduced lingual movement/coordination Oral Phase Functional Implications: Prolonged oral transit Pharyngeal Phase Impairments: (none)        Freddy Spadafora, Breck CoonsLisa Willis 07/09/2017,4:31 PM  Breck CoonsLisa Willis [place] [name] Pager [phone]

## 2017-07-09 NOTE — Consult Note (Signed)
Consultation Note Date: 07/09/2017   Patient Name: [name]  DOB: 06-12-1946  MRN: [id number]  Age / Sex: 71 y.o., male  PCP: Devin Morale, MD Referring Physician: Barton Dubois, MD  Reason for Consultation: Establishing goals of care, Hospice Evaluation and Psychosocial/spiritual support  HPI/Patient Profile: 71 y.o. 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 Per PCP, patient had lost 11 pounds in 1 month and had a poor appetite. Was found to have UTI, possible pneumonia,  and CHF exacerbation. BNP >2000 and signs of volume overload. UTI/?pna treated with IV Unasyn and oral Augmentin. Blood cultures negative.  Patient has continued to experience altered mental status throughout admission - per daughter confusion has been ongoing for last 3-4 weeks. CT scan unremarkable, MRI rules out acute intracranial abnormality, EEG shows encephalopathy but no seizure activity, ammonia normal, and ABG shows no hypercarbia. Receiving PRN haldol and Seroquel for agitation.   Was diuresed with IV lasix. Pt is now on milrinone trial for 24-48 hours - was started 07/07/17. Patient has continued to complain of chest pain and shortness of breath throughout admission. He has also had issues with urinary retention this admission.   Of note, patient is from [place] and speaks [place]. His daughter speaks [place] well.  Patient has been admitted 4 times in previous 6 months. Daughter has been providing 24/7 care to the patient at home.  Hospice recommended by cardiology and PMT consulted for [place].  Clinical Assessment and Goals of Care: Met with patient and daughter at bedside. Daughter tells me patient has been in [place] since May to meet her four children for the  first time. She has lived here for 16 years. The patient is married to her mother and has other children who are all back in [place]. She is his only family in the [place].   Discussed patients medical status and overall decline in function, cognition, and nutrition. Shared concerns that patient is at end of life d/t heart failure. Daughter seemed shocked by this news. She tells me that the patient's mental status has improved because he recognized her this morning and he hasn't on previous days. We discussed that this could be d/t IV milrinone and how this is not a long-term option.   We discussed code status options and differences between a full code and DNR. At this time, she states patient would want "everything done". Clarified this topic further and she would like time to think about it and discuss with other family members.   She tells me her biggest goal for her father is to get him back to [place] to be with his family before he passes. We discussed how this may not be feasible d/t patient's medical status. Need to discuss this further with other healthcare team members b/c if this is the main goal and is possible then the plans should be put into place sooner rather than later.   Daughter would like to hear from other healthcare team members today and take time to process what we have discussed. We did not discuss Hospice care options at this time because she seemed overwhelmed.   Primary Decision Maker NEXT OF KIN - daughter [name]    SUMMARY OF RECOMMENDATIONS    - Continue goals of care discussions daily - daughter needs time  to process news today  - If feasible for patient to return to [place] before he passes, need to arrange plan sooner rather than later - this is daughter's main goal  Code Status/Advance Care Planning:  Full code - daughter needs time to think about code status and discuss with other family members  Symptom Management:   Continue current regimen of haldol and  seroquel for agitation  Palliative Prophylaxis:   Aspiration, Delirium Protocol and Frequent Pain Assessment  Additional Recommendations (Limitations, Scope, Preferences):  Full Scope Treatment  Psycho-social/Spiritual:   Desire for further Chaplaincy support:yes  Additional Recommendations: Education on Hospice  Prognosis:   < 6 months, most likely weeks  Discharge Planning: To Be Determined      Primary Diagnoses: Present on Admission: . Acute on chronic systolic heart failure ([name]) . Right middle cerebral artery stroke ([name]) . Type 2 diabetes mellitus with peripheral neuropathy ([name]) . CAP (community acquired pneumonia) . Benign essential HTN   I have reviewed the medical record, interviewed the patient and family, and examined the patient. The following aspects are pertinent.  Past Medical History:  Diagnosis Date  . CAD (coronary artery disease) 2007   a. CABG 2007 in [place].  . Cerebrovascular disease   . Chronic systolic CHF (congestive heart failure) ([name])    a. EF 20-25% by echo in 03/2017 with NST showing prior infarct but no ischemia. Prior LVEF not known but patient reported history of weak heart.  . Diabetes mellitus without complication ([name])   . Hyperlipidemia   . Hypertension   . Lower extremity embolism ([name])    a. 04/2017 s/p thrombectomy of the right femoropopliteal artery, right below-the-knee popliteal artery endarterectomy with bovine patch angioplasty, and proximal endarterectomy of the right tibioperoneal trunk and peroneal artery..  . Pulmonary embolism ([name])   . PVC's (premature ventricular contractions)   . Stroke [name])    a. ~2016, also 04/2017 with acute R MCA occlusion s/p thrombectomy.  . Thoracic aortic aneurysm ([name])    a. 4.2 vs 4.6cm by different CTs Aug/Sept 2018.   Social History   Socioeconomic History  . Marital status: Married    Spouse name: None  . Number of children: None  . Years of education: None  . Highest  education level: None  Social Needs  . Financial resource strain: None  . Food insecurity - worry: None  . Food insecurity - inability: None  . Transportation needs - medical: None  . Transportation needs - non-medical: None  Occupational History  . None  Tobacco Use  . Smoking status: Never Smoker  . Smokeless tobacco: Never Used  Substance and Sexual Activity  . Alcohol use: No  . Drug use: No  . Sexual activity: None  Other Topics Concern  . None  Social History Narrative   Pt is from [place] and speaks [place]. He came to the [place] in 02/2017.   He has a daughter and son-in-law that speak [place].    Family History  Problem Relation Age of Onset  . Hypertension Mother   . Hypertension Sister    Scheduled Meds: . amoxicillin-clavulanate  1 tablet Oral TID  . aspirin EC  81 mg Oral Daily  . atorvastatin  80 mg Oral q1800  . feeding supplement (ENSURE ENLIVE)  237 mL Oral BID BM  . furosemide  40 mg Intravenous BID  . insulin aspart  0-15 Units Subcutaneous TID WC  . insulin aspart  0-5 Units Subcutaneous QHS  . insulin glargine  5 Units Subcutaneous QHS  . multivitamin with minerals  1 tablet Oral Daily  . pantoprazole  40 mg Oral Daily  . QUEtiapine  25 mg Oral BID  . spironolactone  12.5 mg Oral Daily  . Warfarin - Pharmacist Dosing Inpatient   Does not apply q1800   Continuous Infusions: . milrinone 0.25 mcg/kg/min (07/08/17 1855)   PRN Meds:.acetaminophen, haloperidol lactate, sodium chloride No Known Allergies Review of Systems  Constitutional: Positive for activity change, appetite change, fatigue and unexpected weight change.  Respiratory: Negative for shortness of breath.   Cardiovascular: Negative for chest pain.  Neurological: Positive for weakness.  Psychiatric/Behavioral: Positive for agitation, confusion, decreased concentration and sleep disturbance. The patient is hyperactive.   Denies pain.  Physical Exam  Constitutional: He appears well-developed and  well-nourished. He is active and cooperative. No distress.  HENT:  Head: Normocephalic and atraumatic.  Cardiovascular: Tachycardia present.  Pulses:      Radial pulses are 1+ on the right side, and 1+ on the left side.       Dorsalis pedis pulses are 1+ on the right side, and 1+ on the left side.  Pulmonary/Chest: Effort normal and breath sounds normal. No respiratory distress.  Abdominal: Soft. Bowel sounds are normal. He exhibits no distension. There is no tenderness.  Musculoskeletal:       Right lower leg: He exhibits no edema.       Left lower leg: He exhibits no edema.  Neurological: He is alert. He has normal strength. He is disoriented.  Skin: Skin is warm and dry.  Psychiatric: He is agitated. Cognition and memory are impaired. He expresses impulsivity and inappropriate judgment. He is inattentive.    Vital Signs: BP 99/71 (BP Location: Left Arm)   Pulse (!) 103   Temp 98.2 F (36.8 C) (Oral)   Resp 14   Ht '5\' 10"'  (1.778 m)   Wt 72.8 kg (160 lb 9.6 oz)   SpO2 (!) 87%   BMI 23.04 kg/m  Pain Assessment: 0-10 POSS *See Group Information*: S-Acceptable,Sleep, easy to arouse Pain Score: 7    SpO2: SpO2: (!) 87 % O2 Device:SpO2: (!) 87 % O2 Flow Rate: .O2 Flow Rate (L/min): 5 L/min  IO: Intake/output summary:   Intake/Output Summary (Last 24 hours) at 07/09/2017 0824 Last data filed at 07/09/2017 0358 Gross per 24 hour  Intake -  Output 950 ml  Net -950 ml    LBM: Last BM Date: 07/02/17 Baseline Weight: Weight: 76.7 kg (169 lb) Most recent weight: Weight: 72.8 kg (160 lb 9.6 oz)     Palliative Assessment/Data: 50%     Time Total: 90 minutes Greater than 50%  of this time was spent counseling and coordinating care related to the above assessment and plan.  Juel Burrow, DNP, AGNP-C Palliative Medicine Team [phone]

## 2017-07-09 NOTE — Progress Notes (Signed)
Advanced Heart Failure Rounding Note  PCP: Dr. Venetia NightAmao Primary Cardiologist: Dr. Jens Somrenshaw HF: New (Dr. Gala RomneyBensimhon)  Subjective:    Translator present.  Feeling better. Confused much improved per daughter. Denies chest pain or SOB. No lightheadedness.   Creatinine 1.12. Weight down another 4 lbs, negative 700 cc. Remains on mlirinone 0.25 mcg/kg/min.   Urine culture with pan-sensitive Enteroccus Faecalis. Has been augmentin.   Objective:   Weight Range: 160 lb 9.6 oz (72.8 kg) Body mass index is 23.04 kg/m.   Vital Signs:   Temp:  [97.6 F (36.4 C)-98.5 F (36.9 C)] 98.5 F (36.9 C) (11/21 0737) Pulse Rate:  [94-103] 103 (11/21 0737) Resp:  [13-29] 14 (11/21 0737) BP: (95-107)/(60-73) 99/71 (11/21 0737) SpO2:  [94 %-100 %] 96 % (11/21 0737) Weight:  [160 lb 9.6 oz (72.8 kg)] 160 lb 9.6 oz (72.8 kg) (11/21 0344) Last BM Date: 07/02/17  Weight change: Filed Weights   07/07/17 0514 07/08/17 0500 07/09/17 0344  Weight: 167 lb 8.8 oz (76 kg) 164 lb 14.5 oz (74.8 kg) 160 lb 9.6 oz (72.8 kg)    Intake/Output:   Intake/Output Summary (Last 24 hours) at 07/09/2017 0931 Last data filed at 07/09/2017 0358 Gross per 24 hour  Intake -  Output 950 ml  Net -950 ml      Physical Exam    General: Chronically ill appearing. NAD. Restless  HEENT: Normal Neck: Supple. JVP 7-8 cm. Carotids 2+ bilat; no bruits. No thyromegaly or nodule noted. Cor: PMI laterally displaced. RRR, +s3 Lungs: CTAB, normal effort. Abdomen: Soft, non-tender, non-distended, no HSM. No bruits or masses. +BS  Extremities: No cyanosis, clubbing, or rash. Wasting of RLE. Eschar on 3rd toe of R foot.  Neuro: Alert & orientedx3, cranial nerves grossly intact. Limited movement R side with muscular wasting. Affect pleasant   Telemetry    NSR/ST 90-100s, personally reviewed.   EKG    N/A  Labs    CBC Recent Labs    07/07/17 0444 07/08/17 0447 07/09/17 0409  WBC 7.9 8.5 9.1  NEUTROABS 4.8   --   --   HGB 11.6* 11.6* 12.0*  HCT 32.4* 32.8* 33.6*  MCV 68.9* 69.6* 70.4*  PLT 232 209 239   Basic Metabolic Panel Recent Labs    16/05/9610/20/18 0447 07/09/17 0409  NA 139 139  K 3.5 3.7  CL 104 104  CO2 24 25  GLUCOSE 171* 156*  BUN 26* 25*  CREATININE 1.03 1.12  CALCIUM 9.0 9.2  MG 2.0  --    Liver Function Tests Recent Labs    07/07/17 0444  AST 29  ALT 54  ALKPHOS 176*  BILITOT 1.6*  PROT 6.1*  ALBUMIN 3.4*   No results for input(s): LIPASE, AMYLASE in the last 72 hours. Cardiac Enzymes Recent Labs    07/06/17 1133  TROPONINI 0.03*    BNP: BNP (last 3 results) Recent Labs    05/27/17 1131 06/30/17 2200 07/06/17 0045  BNP 1,040.4* 2,434.3* 3,837.7*    ProBNP (last 3 results) No results for input(s): PROBNP in the last 8760 hours.   D-Dimer No results for input(s): DDIMER in the last 72 hours. Hemoglobin A1C No results for input(s): HGBA1C in the last 72 hours. Fasting Lipid Panel No results for input(s): CHOL, HDL, LDLCALC, TRIG, CHOLHDL, LDLDIRECT in the last 72 hours. Thyroid Function Tests No results for input(s): TSH, T4TOTAL, T3FREE, THYROIDAB in the last 72 hours.  Invalid input(s): FREET3  Other results:   Imaging  Dg Chest Port 1 View  Result Date: 07/08/2017 CLINICAL DATA:  Shortness of breath EXAM: PORTABLE CHEST 1 VIEW COMPARISON:  07/05/2017 FINDINGS: Obscuration of both hemidiaphragms with mild enlargement of the cardiopericardial silhouette. Prior median sternotomy. Atherosclerotic calcification of the aortic arch. IMPRESSION: 1. Indistinct obscuration of both hemidiaphragms potentially from her space opacities or layering pleural effusions. Relative clearing of the upper lobes compared to 07/05/17. 2. Stable enlargement of the cardiopericardial silhouette. 3.  Aortic Atherosclerosis (ICD10-I70.0). Electronically Signed   By: [name] M.D.   On: 07/08/2017 19:15    Medications:    Scheduled Medications: .  amoxicillin-clavulanate  1 tablet Oral TID  . aspirin EC  81 mg Oral Daily  . atorvastatin  80 mg Oral q1800  . feeding supplement (ENSURE ENLIVE)  237 mL Oral BID BM  . furosemide  40 mg Intravenous BID  . insulin aspart  0-15 Units Subcutaneous TID WC  . insulin aspart  0-5 Units Subcutaneous QHS  . insulin glargine  5 Units Subcutaneous QHS  . multivitamin with minerals  1 tablet Oral Daily  . pantoprazole  40 mg Oral Daily  . QUEtiapine  25 mg Oral BID  . spironolactone  12.5 mg Oral Daily  . Warfarin - Pharmacist Dosing Inpatient   Does not apply q1800    Infusions: . milrinone 0.25 mcg/kg/min (07/08/17 1855)    PRN Medications: acetaminophen, haloperidol lactate, sodium chloride  Patient Profile   [name] is a 71 y/o M with history of CAD (s/p CABG in 2007 in [place]), chronic systolic CHF (EF 91-47% in 03/2017),, thoracic aortic aneurysm 03/2017 (4.2 vs 4.6cm by CTs Aug/Sept 2018), cerebrovascular disease, HTN, HLD, Type 2 DM, stroke (~2016, also 04/2017 with acute R MCA occlusion and R popliteal embolic occlusion, placed on Coumadin), PVCs by prior telemetry who is being seen today for the evaluation of CHF at the request of Dr. Allena Katz.   He was admitted with weight loss, worsening confusion for several weeks, weakness, dyspnea. With diuresis has had intermittent hypotension.  Assessment/Plan   1. Acute encephalopathy - Much improved with milrinone and treatment of UTI.  - Likely multifactorial with low output and residual neuro deficit from stroke.  2. Acute on chronic systolic CHF  - Echo 07/07/17 LVEF 15-20%, Trivial AI, Severe LAE, Mod RV dilation, Mod/Sev RAE, P peak pressure 40 mmHg. - Volume status improved. - IV lasix 40 mg ordered for this morning. Will stop afterward and can likely transition to po.  - Cut back milrinone to 0.125 mcg/kg/min.  3. Intermittent hypotension concerning for low output - Pressures remain soft in 90-100s on milrinone.  4. CAD as  above with minimal troponin bump (0.03) - CP resolved.  - No indication for cath at this time.  5. H/o embolic stroke and lower extremity embolism in 04/2017 - This was very debilitating and he has made very little progress in recovery. No change.  6. UTI - Pan sensitive enterococcus.  - On augmentin.  7. GOC - Palliative care team to meet with pt and daughter today. - PT recommending SNF  - Improved today but still restless and intermittently confused.  Not a great candidate for home milrinone with ? Poor prognosis and poor self care. May be able to manage in SNF if he needs.   Medication concerns reviewed with patient and pharmacy team. Barriers identified: No insurance on file.   Length of Stay: [address]  Graciella Freer, [place]  07/09/2017, 9:31 AM  Advanced Heart  Failure Team Pager [phone] (M-F; 7a - 4p)  Please contact [facility] for night-coverage after hours (4p -7a ) and weekends on [email or web address]  Patient seen and examined with the above-signed Advanced Practice Provider and/or Housestaff. I personally reviewed laboratory data, imaging studies and relevant notes. I independently examined the patient and formulated the important aspects of the plan. I have edited the note to reflect any of my changes or salient points. I have personally discussed the plan with the patient and/or family.  Symptomatically somewhat improved with milrinone and IV diuresis but still restless and intermittently confused. I do not think chronic milrinone therapy will restore his QOL in any meaningful way. We will wean to off over the next 24 hours.   Appreciate Palliative Care input. I had a long talk with his daughter and she understands the gravity of her father's situation but she feels sad that she brought her father here from [name] to see her four children and he has fell ill since that time. She realizes he is quite sick but really wants to find a way to get him home to [name] to die among his family.  I told her that without the assistance of an air ambulance I did not think we could find a way to do this.   Total time spent 45 minutes. Over half that time spent discussing above.   Devin Meresaniel Elai Vanwyk, MD  4:49 PM

## 2017-07-09 NOTE — Progress Notes (Signed)
CSW met with patient, daughter, and MD at bedside. Plan to stop milrinone and observe patient, and plan for SNF with palliative vs hospice, pending patient progression off medication. Daughter has had [place] meeting with palliative. Daughter feeling overwhelmed and wants to consider options and discuss with family. Daughter had wanted to bring patient home to [place], where their extended family lives, but understands patient is not well enough to travel at this time. CSW will follow for disposition plan.  [name], [place]

## 2017-07-10 DIAGNOSIS — R531 Weakness: Secondary | ICD-10-CM

## 2017-07-10 LAB — BASIC METABOLIC PANEL
Anion gap: 10 (ref 5–15)
BUN: 23 mg/dL — AB (ref 6–20)
CALCIUM: 9.2 mg/dL (ref 8.9–10.3)
CO2: 25 mmol/L (ref 22–32)
CREATININE: 1.07 mg/dL (ref 0.61–1.24)
Chloride: 104 mmol/L (ref 101–111)
GFR calc non Af Amer: >60 mL/min (ref >60)
Glucose, Bld: 156 mg/dL — ABNORMAL HIGH (ref 65–99)
Potassium: 3.9 mmol/L (ref 3.5–5.1)
Sodium: 139 mmol/L (ref 135–145)

## 2017-07-10 LAB — CBC
HCT: 34.8 % — ABNORMAL LOW (ref 39.0–52.0)
Hemoglobin: 12.3 g/dL — ABNORMAL LOW (ref 13.0–17.0)
MCH: 24.7 pg — AB (ref 26.0–34.0)
MCHC: 35.3 g/dL (ref 30.0–36.0)
MCV: 69.9 fL — ABNORMAL LOW (ref 78.0–100.0)
PLATELETS: 233 10*3/uL (ref 150–400)
RBC: 4.98 MIL/uL (ref 4.22–5.81)
RDW: 15.6 % — ABNORMAL HIGH (ref 11.5–15.5)
WBC: 8.7 10*3/uL (ref 4.0–10.5)

## 2017-07-10 LAB — PROTIME-INR
INR: 2.68
PROTHROMBIN TIME: 28.3 s — AB (ref 11.4–15.2)

## 2017-07-10 LAB — GLUCOSE, CAPILLARY
GLUCOSE-CAPILLARY: 166 mg/dL — AB (ref 65–99)
GLUCOSE-CAPILLARY: 147 mg/dL — AB (ref 65–99)
GLUCOSE-CAPILLARY: 147 mg/dL — AB (ref 65–99)
Glucose-Capillary: 170 mg/dL — ABNORMAL HIGH (ref 65–99)

## 2017-07-10 LAB — AMMONIA: AMMONIA: 16 umol/L (ref 9–35)

## 2017-07-10 MED ORDER — WARFARIN SODIUM 7.5 MG PO TABS
7.5000 mg | ORAL_TABLET | Freq: Once | ORAL | Status: AC
  Administered 2017-07-10: 7.5 mg via ORAL
  Filled 2017-07-10: qty 1

## 2017-07-10 MED ORDER — DIGOXIN 125 MCG PO TABS
0.1250 mg | ORAL_TABLET | Freq: Every day | ORAL | Status: DC
  Administered 2017-07-10 – 2017-07-15 (×6): 0.125 mg via ORAL
  Filled 2017-07-10 (×7): qty 1

## 2017-07-10 NOTE — Progress Notes (Signed)
ANTICOAGULATION CONSULT NOTE - Follow Up Consult  Pharmacy Consult for: Coumadin Indication: h/o CVA  No Known Allergies  Patient Measurements: Height: 5\' 10"  (177.8 cm) Weight: 158 lb 9.6 oz (71.9 kg) IBW/kg (Calculated) : 73  Vital Signs: Temp: 98 F (36.7 C) (11/22 0518) Temp Source: Axillary (11/22 0518) BP: 98/72 (11/22 0518) Pulse Rate: 101 (11/22 0518)  Labs: Recent Labs    07/08/17 0447 07/09/17 0409 07/10/17 0330  HGB 11.6* 12.0* 12.3*  HCT 32.8* 33.6* 34.8*  PLT 209 239 233  LABPROT 27.3* 28.5* 28.3*  INR 2.56 2.70 2.68  CREATININE 1.03 1.12 1.07    Estimated Creatinine Clearance: 65.3 mL/min (by C-G formula based on SCr of 1.07 mg/dL).  Assessment: 71yom continues on coumadin for h/o CVA. INR therapeutic at 2.68. CBC stable. No bleeding.  PTA dose: 11.25 mg on MWF and 7.5 mg AOD's per clinic  Goal of Therapy:  INR 2-3 Monitor platelets by anticoagulation protocol: Yes   Plan:  1) Warfarin 7.5mg  tonight 2) Daily INR  Ladell PierBrooke Wilba Mutz, PharmD Pharmacy Resident [phone] 07/10/2017 11:22 AM

## 2017-07-10 NOTE — Plan of Care (Signed)
Per patient's daughter, patient's confusion has much improved over the last few days.  Per patient's daughter when patient is awake and not sleepy she does not notice confusion.  If patient is sleepy or has just woken up sometimes she notes some confusion but patient re-orients easily.  For example patient will not remember where he is if he has just woken up but once he looks around he will remember he is in the hospital.

## 2017-07-10 NOTE — Progress Notes (Addendum)
Advanced Heart Failure Rounding Note  PCP: Dr. Venetia NightAmao Primary Cardiologist: Dr. Jens Somrenshaw HF: New (Dr. Gala RomneyBensimhon)  Subjective:    Translator present.  Urine culture with pan-sensitive Enteroccus Faecalis. Has been augmentin.   Remains on milrinone 0.125. Feels better. Less agitated. No dyspnea or PND. Remains weak.   Weight down another 2 pounds. (11 pounds total)  Objective:   Weight Range: 71.9 kg (158 lb 9.6 oz) Body mass index is 22.76 kg/m.   Vital Signs:   Temp:  [97.5 F (36.4 C)-98.2 F (36.8 C)] 98 F (36.7 C) (11/22 0518) Pulse Rate:  [94-106] 101 (11/22 0518) Resp:  [10-38] 24 (11/22 0518) BP: (90-105)/(66-72) 98/72 (11/22 0518) SpO2:  [88 %-100 %] 100 % (11/22 0620) Weight:  [71.9 kg (158 lb 9.6 oz)] 71.9 kg (158 lb 9.6 oz) (11/22 0518) Last BM Date: 07/09/17  Weight change: Filed Weights   07/08/17 0500 07/09/17 0344 07/10/17 0518  Weight: 74.8 kg (164 lb 14.5 oz) 72.8 kg (160 lb 9.6 oz) 71.9 kg (158 lb 9.6 oz)    Intake/Output:   Intake/Output Summary (Last 24 hours) at 07/10/2017 0907 Last data filed at 07/10/2017 0630 Gross per 24 hour  Intake 375.66 ml  Output 495 ml  Net -119.34 ml      Physical Exam    General:  Sitting up in bed. Weak appearing. No resp difficulty HEENT: normal Neck: supple. no JVD. Carotids 2+ bilat; no bruits. No lymphadenopathy or thryomegaly appreciated. Cor: PMI laterally displaced. Regular rate & rhythm. +s3 Lungs: clear Abdomen: soft, nontender, nondistended. No hepatosplenomegaly. No bruits or masses. Good bowel sounds. Extremities: no cyanosis, clubbing, rash, edema  Wasting of RLE. Eschar on 3rd toe of R foot.  Neuro: alert & orientedx3, cranial nerves grossly intact. moves all 4 extremities w/o difficulty. Affect pleasant  Telemetry    S tach 100-105 personally reviewed.   EKG    N/A  Labs    CBC Recent Labs    07/09/17 0409 07/10/17 0330  WBC 9.1 8.7  HGB 12.0* 12.3*  HCT 33.6* 34.8*    MCV 70.4* 69.9*  PLT 239 233   Basic Metabolic Panel Recent Labs    13/03/6510/20/18 0447 07/09/17 0409 07/10/17 0330  NA 139 139 139  K 3.5 3.7 3.9  CL 104 104 104  CO2 24 25 25   GLUCOSE 171* 156* 156*  BUN 26* 25* 23*  CREATININE 1.03 1.12 1.07  CALCIUM 9.0 9.2 9.2  MG 2.0  --   --    Liver Function Tests No results for input(s): AST, ALT, ALKPHOS, BILITOT, PROT, ALBUMIN in the last 72 hours. No results for input(s): LIPASE, AMYLASE in the last 72 hours. Cardiac Enzymes No results for input(s): CKTOTAL, CKMB, CKMBINDEX, TROPONINI in the last 72 hours.  BNP: BNP (last 3 results) Recent Labs    05/27/17 1131 06/30/17 2200 07/06/17 0045  BNP 1,040.4* 2,434.3* 3,837.7*    ProBNP (last 3 results) No results for input(s): PROBNP in the last 8760 hours.   D-Dimer No results for input(s): DDIMER in the last 72 hours. Hemoglobin A1C No results for input(s): HGBA1C in the last 72 hours. Fasting Lipid Panel No results for input(s): CHOL, HDL, LDLCALC, TRIG, CHOLHDL, LDLDIRECT in the last 72 hours. Thyroid Function Tests No results for input(s): TSH, T4TOTAL, T3FREE, THYROIDAB in the last 72 hours.  Invalid input(s): FREET3  Other results:   Imaging    No results found.  Medications:    Scheduled Medications: . aspirin  EC  81 mg Oral Daily  . atorvastatin  80 mg Oral q1800  . feeding supplement (ENSURE ENLIVE)  237 mL Oral BID BM  . insulin aspart  0-15 Units Subcutaneous TID WC  . insulin aspart  0-5 Units Subcutaneous QHS  . insulin glargine  5 Units Subcutaneous QHS  . multivitamin with minerals  1 tablet Oral Daily  . pantoprazole  40 mg Oral Daily  . QUEtiapine  25 mg Oral BID  . spironolactone  12.5 mg Oral Daily  . Warfarin - Pharmacist Dosing Inpatient   Does not apply q1800    Infusions: . milrinone 0.125 mcg/kg/min (07/09/17 2052)    PRN Medications: acetaminophen, haloperidol lactate, sodium chloride  Patient Profile   Mr. Lennie OdorSangare is a 71  y/o M with history of CAD (s/p CABG in 2007 in [place]), chronic systolic CHF (EF 16-10%20-25% in 03/2017),, thoracic aortic aneurysm 03/2017 (4.2 vs 4.6cm by CTs Aug/Sept 2018), cerebrovascular disease, HTN, HLD, Type 2 DM, stroke (~2016, also 04/2017 with acute R MCA occlusion and R popliteal embolic occlusion, placed on Coumadin), PVCs by prior telemetry who is being seen today for the evaluation of CHF at the request of Dr. Allena KatzPatel.   He was admitted with weight loss, worsening confusion for several weeks, weakness, dyspnea. With diuresis has had intermittent hypotension.  Assessment/Plan   1. Acute encephalopathy - Much improved with milrinone and treatment of UTI. Not sure which was predominant factor  - Residual neuro deficit from stroke also contributing probably.  2. Acute on chronic systolic CHF  - Echo 07/07/17 LVEF 15-20%, Trivial AI, Severe LAE, Mod RV dilation, Mod/Sev RAE, P peak pressure 40 mmHg. - Volume status improved. Only on spiro now. Will not start lasix today with soft BP - Remains on milrinone. Will stop today and watch. Hopefully will remain stable - Start digoxin. Continue spiro - No bb or ACE/ARB/ARNI with low output and low BP 3. Intermittent hypotension concerning for low output - Pressures remain soft in 90-100s. Treat as above.  4. CAD as above with minimal troponin bump (0.03) - CP resolved.  - No indication for cath at this time.  5. H/o embolic stroke and lower extremity embolism in 04/2017 - This was very debilitating and he has made very little progress in recovery. No change.  - Continue warfarin, low dose ASA and statin. 6. UTI - Pan sensitive enterococcus.  - On augmentin.  7. GOC - Palliative care has seen.  - PT recommending SNF - Appreciate Palliative Care input. I had a long talk with his daughter yesterday and she understands the gravity of her father's situation but she feels sad that she brought her father here from [name] to see her four children and he  has fell ill since that time. She realizes he is quite sick but really wants to find a way to get him home to [name] to die among his family. I told her that without the assistance of an air ambulance I did not think we could find a way to do this. Will continue to monitor. SW involved. Prognosis overall quite poor.   Length of Stay: 9  Arvilla Meresaniel Dorthula Bier, MD  07/10/2017, 9:07 AM  Advanced Heart Failure Team Pager [phone] (M-F; 7a - 4p)  Please contact [facility] for night-coverage after hours (4p -7a ) and weekends on [email or web address]

## 2017-07-10 NOTE — Progress Notes (Signed)
[facility] Progress Note  Patient: [name] UJW:[id number]   PCP: Jaclyn ShaggyAmao, Enobong, MD DOB: [birth year]   DOA: 06/30/2017   DOS: 07/10/2017   Date of Service: the patient was seen and examined on 07/10/2017  Subjective: afebrile, calmer and breathing easier. No CP. Per daughter reports appetite is slightly better as well.  Brief hospital course: Pt. with PMH of CAD, chronic systolic CHF, right MCA stroke, type II DM, HTN; admitted on 06/30/2017, presented with complaint of confusion, was found to have UTI and pneumonia.  So has acute on chronic CHF started on IV milrinone. Currently further plan is continue close monitoring in the stepdown unit for now.  Assessment and Plan: 1.  Acute metabolic encephalopathy. -Likely from UTI as well as pneumonia. Also decrease perfusion in setting of worsening heart failure. -Symptomatology has been ongoing for last 3-4 weeks per patient's daughter. -CT scan unremarkable, ABG shows no hypercarbia. -Troponin negative EKG negative. -No new focal deficit on examination right now other than confusion. -Already treated with antibiotics with IV Unasyn, and oral Augmentin. No fever and non toxic in appearance. -EEG completed,shows metabolic encephalopathy, no active seizure focus. -MRI rules out any acute intracranial abnormality. -Ammonia level mildly elevated, recheck stable. -PRN Haldol, Seroquel.  Discontinuing all other psychotropic medications. -continue holding gabapentin -continue constant re-orientation and supportive care. -improved.  2.  UTI and community-acquired pneumonia. -Urine with enterococcus sensitive to all antibiotics. -There was a question of patient having pneumonia as well (concerns for aspiration). -treated with unasyn and then Augmentin. -Blood cultures negative fo any bacterial growth. -patient afebrile  3.  Acute on chronic systolic CHF/Acute hypoxic respiratory failure. -Echocardiogram shows 15-20% EF with diffuse  hypokinesis. -Patient was diuresed with IV Lasix. W/o significant improvement. -Pt is already on therapeutic anti-coagulation with warfarin with therapeutic INR. -continue oxygen supplementation and wean off as tolerated. -start digoxin as per cardiology rec's -continue spironolactone -stop milrinone   4.  Multiple PVCs, -NSVT. -Monitor electrolytes and replete as needed. -once BP and CHF more stable, probably will benefit of resuming B-blcoker  5.  Coronary artery disease. -Two-vessel CABG in 2007. -Monitor on telemetry. -No chest pain  6.  Type II DM. -slight improvement in his appetite today -continue SSI  7.  Recent right MCA stroke in 2018 -S/P mechanical thrombectomy on last admission. -Continue aspirin and Coumadin for secondary prevention  8.  Chest pain. -Most likely demand ischemia in the presence of CHF exacerbation  9.  Goals of care discussion. -Patient with decompensated CHF, progressively worsening mentation without any reversible cause, recent acute CVA. -Patient also with poor p.o. intake and dysphagia 2. -daughter realizing bad shape of her dad's health; palliative care on board and helping with GOC. -further info/prognosis to be determine base on his response off milrinone drip.   DVT Prophylaxis: on Warfarin  Advance goals of care discussion: Still full code  Family Communication: Daughter at bedside  Disposition:  Per cardiology will stop milrinone drip today, start digoxin and continue using spironolactone. Follow clinical response.  Consultants: cardiology  Procedures: EEG, Echocardiogram   Antibiotics: Anti-infectives (From admission, onward)   Start     Dose/Rate Route Frequency Ordered Stop   07/04/17 1000  amoxicillin-clavulanate (AUGMENTIN) 500-125 MG per tablet 500 mg     1 tablet Oral 3 times daily 07/04/17 0917 07/09/17 2101   07/03/17 1200  Ampicillin-Sulbactam (UNASYN) 3 g in sodium chloride 0.9 % 100 mL IVPB  Status:  Discontinued      3 g 200  mL/hr over 30 Minutes Intravenous Every 8 hours 07/03/17 1159 07/04/17 0917   07/02/17 2000  azithromycin (ZITHROMAX) tablet 250 mg  Status:  Discontinued     250 mg Oral Daily 07/01/17 1050 07/04/17 0911   07/02/17 1830  ciprofloxacin (CIPRO) IVPB 400 mg  Status:  Discontinued     400 mg 200 mL/hr over 60 Minutes Intravenous Every 12 hours 07/02/17 1742 07/03/17 1159   07/01/17 2200  cefTRIAXone (ROCEPHIN) 1 g in dextrose 5 % 50 mL IVPB  Status:  Discontinued     1 g 100 mL/hr over 30 Minutes Intravenous Every 24 hours 07/01/17 1057 07/03/17 1158   07/01/17 2000  azithromycin (ZITHROMAX) tablet 500 mg     500 mg Oral Daily 07/01/17 1050 07/01/17 2000   07/01/17 0100  cefTRIAXone (ROCEPHIN) 1 g in dextrose 5 % 50 mL IVPB  Status:  Discontinued     1 g 100 mL/hr over 30 Minutes Intravenous Daily at bedtime 07/01/17 0050 07/01/17 0334   07/01/17 0100  azithromycin (ZITHROMAX) tablet 500 mg  Status:  Discontinued     500 mg Oral Daily at bedtime 07/01/17 0050 07/01/17 0334      Objective: Physical Exam: Vitals:   07/10/17 1139 07/10/17 1323 07/10/17 1654 07/10/17 2047  BP:  100/67 95/82 109/77  Pulse: (!) 103 95 89 (!) 102  Resp:  15 (!) 28 (!) 21  Temp:  98.3 F (36.8 C) 98.3 F (36.8 C) 98.4 F (36.9 C)  TempSrc:  Oral Oral Oral  SpO2:  100% 100% 100%  Weight:      Height:        Intake/Output Summary (Last 24 hours) at 07/10/2017 2258 Last data filed at 07/10/2017 1800 Gross per 24 hour  Intake 658.02 ml  Output 475 ml  Net 183.02 ml   Filed Weights   07/08/17 0500 07/09/17 0344 07/10/17 0518  Weight: 74.8 kg (164 lb 14.5 oz) 72.8 kg (160 lb 9.6 oz) 71.9 kg (158 lb 9.6 oz)   General: no fever, no CP and with reported improvement in breathing. Patient is calmer and currently oriented x 2.    Cardiovascular: RRR, no rubs, no gallops Respiratory: improved air movement, no wheezing, no fran crackles, no using accessory muscles. Abdomen: soft, NT, ND,  positive BS. Extremities: no edema, no cyanosis, no clubbing. Moving four limbs spontaneously; even right side with residual paresis from previous stroke and some muscular wasting appearance.   Data Reviewed: CBC: Recent Labs  Lab 07/04/17 0352 07/04/17 1301 07/05/17 0727 07/07/17 0444 07/08/17 0447 07/09/17 0409 07/10/17 0330  WBC 5.7 5.0 6.0 7.9 8.5 9.1 8.7  NEUTROABS 3.4 3.2 3.1 4.8  --   --   --   HGB 11.5* 11.2* 12.1* 11.6* 11.6* 12.0* 12.3*  HCT 32.6* 31.3* 32.9* 32.4* 32.8* 33.6* 34.8*  MCV 68.6* 69.2* 69.4* 68.9* 69.6* 70.4* 69.9*  PLT 251 220 233 232 209 239 233   Basic Metabolic Panel: Recent Labs  Lab 07/04/17 0352  07/05/17 0216 07/05/17 0727 07/07/17 0444 07/08/17 0447 07/09/17 0409 07/10/17 0330  NA 134*   < >  --  137 140 139 139 139  K 4.0   < >  --  4.2 3.7 3.5 3.7 3.9  CL 98*   < >  --  104 106 104 104 104  CO2 24   < >  --  22 25 24 25 25   GLUCOSE 274*   < >  --  214* 166* 171* 156*  156*  BUN 29*   < >  --  37* 28* 26* 25* 23*  CREATININE 1.26*   < >  --  1.22 1.04 1.03 1.12 1.07  CALCIUM 9.5   < >  --  9.4 9.3 9.0 9.2 9.2  MG 1.9  --  2.2  --   --  2.0  --   --    < > = values in this interval not displayed.    Liver Function Tests: Recent Labs  Lab 07/04/17 1301 07/05/17 0727 07/07/17 0444  AST 121* 57* 29  ALT 101* 91* 54  ALKPHOS 210* 213* 176*  BILITOT 1.1 1.0 1.6*  PROT 6.2* 6.4* 6.1*  ALBUMIN 3.6 3.5 3.4*    Recent Labs  Lab 07/05/17 0216 07/09/17 0409 07/10/17 0330  AMMONIA 16 22 16    Coagulation Profile: Recent Labs  Lab 07/06/17 0524 07/07/17 0444 07/08/17 0447 07/09/17 0409 07/10/17 0330  INR 3.02 2.57 2.56 2.70 2.68   Cardiac Enzymes: Recent Labs  Lab 07/05/17 0216 07/05/17 0727 07/06/17 0045 07/06/17 0524 07/06/17 1133  TROPONINI <0.03 <0.03 <0.03 0.03* 0.03*   CBG: Recent Labs  Lab 07/09/17 2054 07/10/17 0814 07/10/17 1137 07/10/17 1653 07/10/17 2050  GLUCAP 158* 166* 170* 147* 147*    Studies: No results found.  Scheduled Meds: . aspirin EC  81 mg Oral Daily  . atorvastatin  80 mg Oral q1800  . digoxin  0.125 mg Oral Daily  . feeding supplement (ENSURE ENLIVE)  237 mL Oral BID BM  . insulin aspart  0-15 Units Subcutaneous TID WC  . insulin aspart  0-5 Units Subcutaneous QHS  . insulin glargine  5 Units Subcutaneous QHS  . multivitamin with minerals  1 tablet Oral Daily  . pantoprazole  40 mg Oral Daily  . QUEtiapine  25 mg Oral BID  . spironolactone  12.5 mg Oral Daily  . Warfarin - Pharmacist Dosing Inpatient   Does not apply q1800   Continuous Infusions:  PRN Meds: acetaminophen, haloperidol lactate, sodium chloride  Time spent: 25 min  Author: Vassie Lollarlos Vanessa Kampf, MD [facility] Pager: [phone] 07/10/2017 10:58 PM  If 7PM-7AM, please contact night-coverage at [email or web address], password [email or web address]

## 2017-07-10 NOTE — Progress Notes (Signed)
Daily Progress Note   Patient Name: [name]       Date: 07/10/2017 DOB: 12-16-1945  Age: 71 y.o. MRN#: [id number] Attending Physician: Barton Dubois, MD Primary Care Physician: Arnoldo Morale, MD Admit Date: 06/30/2017  Reason for Consultation/Follow-up: Establishing goals of care and Psychosocial/spiritual support  Subjective: Patient appears agitated, dozing off during visit, pulling off oxygen. Daughter reports he is less confused - unsure if this is true. He speaks to her in [place] but does not acknowledge me.   Length of Stay: 9  Current Medications: Scheduled Meds:  . aspirin EC  81 mg Oral Daily  . atorvastatin  80 mg Oral q1800  . digoxin  0.125 mg Oral Daily  . feeding supplement (ENSURE ENLIVE)  237 mL Oral BID BM  . insulin aspart  0-15 Units Subcutaneous TID WC  . insulin aspart  0-5 Units Subcutaneous QHS  . insulin glargine  5 Units Subcutaneous QHS  . multivitamin with minerals  1 tablet Oral Daily  . pantoprazole  40 mg Oral Daily  . QUEtiapine  25 mg Oral BID  . spironolactone  12.5 mg Oral Daily  . Warfarin - Pharmacist Dosing Inpatient   Does not apply q1800    Continuous Infusions:   PRN Meds: acetaminophen, haloperidol lactate, sodium chloride  Physical Exam  Constitutional: He appears listless. No distress.  HENT:  Head: Normocephalic and atraumatic.  Cardiovascular: Tachycardia present.  Pulses:      Radial pulses are 1+ on the right side, and 1+ on the left side.       Dorsalis pedis pulses are 1+ on the right side, and 1+ on the left side.  Pulmonary/Chest: Effort normal and breath sounds normal. No accessory muscle usage. No respiratory distress.  Abdominal: Soft. Bowel sounds are normal. He exhibits no distension. There is no tenderness. There is no  guarding.  Musculoskeletal:       Right lower leg: He exhibits no edema.       Left lower leg: He exhibits no edema.  Neurological: He appears listless. He is disoriented.  Skin: Skin is warm and dry. Capillary refill takes less than 2 seconds. He is not diaphoretic.  Psychiatric: He is agitated. Cognition and memory are impaired. He expresses impulsivity. He is inattentive.            Vital Signs: BP 98/72 (BP Location: Left Arm)   Pulse (!) 101   Temp 98 F (36.7 C) (Axillary)   Resp (!) 24   Ht '5\' 10"'  (1.778 m)   Wt 71.9 kg (158 lb 9.6 oz)   SpO2 100%   BMI 22.76 kg/m  SpO2: SpO2: 100 % O2 Device: O2 Device: Not Delivered O2 Flow Rate: O2 Flow Rate (L/min): 2 L/min  Intake/output summary:   Intake/Output Summary (Last 24 hours) at 07/10/2017 1012 Last data filed at 07/10/2017 0630 Gross per 24 hour  Intake 375.66 ml  Output 495 ml  Net -119.34 ml   LBM: Last BM Date: 07/09/17 Baseline Weight: Weight: 76.7 kg (169 lb) Most recent weight: Weight: 71.9 kg (158 lb 9.6 oz)       Palliative Assessment/Data: 50%    Flowsheet Rows  Most Recent Value  Intake Tab  Referral Department  Hospitalist  Unit at Time of Referral  Intermediate Care Unit  Palliative Care Primary Diagnosis  Cardiac  Date Notified  07/08/17  Palliative Care Type  New Palliative care  Reason for referral  Clarify Goals of Care, Counsel Regarding Hospice  Date of Admission  06/30/17  Date first seen by Palliative Care  07/09/17  # of days Palliative referral response time  1 Day(s)  # of days IP prior to Palliative referral  8  Clinical Assessment  Palliative Performance Scale Score  50%  Psychosocial & Spiritual Assessment  Palliative Care Outcomes  Patient/Family meeting held?  Yes  Who was at the meeting?  patient and daughter  Palliative Care Outcomes  Provided psychosocial or spiritual support      Patient Active Problem List   Diagnosis Date Noted  . Encephalopathy   .  Palliative care by specialist   . Goals of care, counseling/discussion   . CAP (community acquired pneumonia) 07/01/2017  . CAD (coronary artery disease) 05/27/2017  . Chronic combined systolic and diastolic heart failure ([place]) 05/27/2017  . Long term (current) use of anticoagulants [Z79.01] 05/16/2017  . Chronic anticoagulation   . Shortness of breath   . Diabetes mellitus type 2 in nonobese ([name])   . Subtherapeutic anticoagulation   . Neuropathic pain   . AKI (acute kidney injury) ([place])   . Right middle cerebral artery stroke ([place]) 05/05/2017  . Coronary artery disease involving coronary bypass graft of native heart without angina pectoris   . Type 2 diabetes mellitus with peripheral neuropathy ([name])   . Benign essential HTN   . Hyperlipidemia   . Acute blood loss anemia   . Anemia of chronic disease   . History of medication noncompliance   . Ischemia of right lower extremity   . Endotracheally intubated   . Ischemia of left lower extremity   . Respiratory failure ([place])   . Occlusion of left carotid artery   . Urinary retention   . Ischemic cardiomyopathy   . S/P CABG (coronary artery bypass graft)   . Cerebral embolism with cerebral infarction 04/25/2017  . Acute on chronic systolic heart failure ([place]) 04/24/2017  . Hyponatremia 04/24/2017  . CHF (congestive heart failure) ([place]) 04/17/2017    Palliative Care Assessment & Plan   HPI: 71 y.o. male  with past medical history of systolic CHF (EF 54-00% on 07/07/17), CAD (CABG 2007 in [place]), R MCA stroke in September 2018 s/p mechanical thrombectomy, T2DM, HTN, HLD, and a thoracic aneurysm admitted on 06/30/2017 with weight loss and confusion. Per PCP, patient had lost 11 pounds in 1 month and had a poor appetite. Was found to have UTI, possible pneumonia,  and CHF exacerbation. BNP >2000 and signs of volume overload. UTI/?pna treated with IV Unasyn and oral Augmentin. Blood cultures negative.  Patient has continued to  experience altered mental status throughout admission - per daughter confusion has been ongoing for last 3-4 weeks. CT scan unremarkable, MRI rules out acute intracranial abnormality, EEG shows encephalopathy but no seizure activity, ammonia normal, and ABG shows no hypercarbia. Receiving PRN haldol and Seroquel for agitation.   Was diuresed with IV lasix. Pt is now on milrinone trial for 24-48 hours - was started 07/07/17. Patient has continued to complain of chest pain and shortness of breath throughout admission. He has also had issues with urinary retention this admission.   Of note, patient is from [place] and speaks [place].  His daughter speaks [place] well.  Patient has been admitted 4 times in previous 6 months. Daughter has been providing 24/7 care to the patient at home.  Hospice recommended by cardiology and PMT consulted for [place].  Assessment: Met with daughter and patient at bedside to follow up on yesterday's conversation. Daughter appears overwhelmed. She feels as though patient continues to improve - we discussed how this is likely d/t milrinone and is a short-term medication for her father. She understands this and understands that milrinone is being stopped today. She is eager to see how her father does off of the medication and hopes that he will be strong enough to fly home to [place] to be with his wife and children. I explained to her that this is unlikely and per heart failure team, impossible without air ambulance. We discussed option of the family coming here to be with him and she does not think this is feasible. She states she understands poor prognosis, but wants to give him time to see how he does and is hopeful for improvement.  She tells me she was unable to speak with the rest of the family yesterday - plans to do so today to discuss code status options. For now, she does not want to make any decisions until she speaks with them.  We discussed disposition options - it is  really important to her that he come home with her. Explained that SNF is recommended for the patient by PT and discussed option of Hospice. She has heard of hospice and understands their services but continue to states she wants to see how he does off of milrinone before discussing options further.   Recommendations/Plan:  Will continue to follow along and see how patient does off of milrinone -  This will guide further [place] conversations with daughter  Daughter needs time to discuss care with other family members and her husband before code status decision  Continue current plan of care  Goals of Care and Additional Recommendations:  Limitations on Scope of Treatment: Full Scope Treatment  Code Status:  Full code - pending daughter's conversation with other family members in [place]  Prognosis:   Unable to determine  Discharge Planning:  To Be Determined  Care plan was discussed with daughter  Thank you for allowing the Palliative Medicine Team to assist in the care of this patient.   Total Time 25 minutes Prolonged Time Billed  no       Greater than 50%  of this time was spent counseling and coordinating care related to the above assessment and plan.  Juel Burrow, DNP, AGNP-C Palliative Medicine Team Team Phone # [phone]

## 2017-07-11 LAB — BASIC METABOLIC PANEL
ANION GAP: 11 (ref 5–15)
BUN: 22 mg/dL — ABNORMAL HIGH (ref 6–20)
CO2: 21 mmol/L — ABNORMAL LOW (ref 22–32)
Calcium: 9.2 mg/dL (ref 8.9–10.3)
Chloride: 105 mmol/L (ref 101–111)
Creatinine, Ser: 1 mg/dL (ref 0.61–1.24)
Glucose, Bld: 162 mg/dL — ABNORMAL HIGH (ref 65–99)
POTASSIUM: 4 mmol/L (ref 3.5–5.1)
SODIUM: 137 mmol/L (ref 135–145)

## 2017-07-11 LAB — CBC
HEMATOCRIT: 35.3 % — AB (ref 39.0–52.0)
HEMOGLOBIN: 12.6 g/dL — AB (ref 13.0–17.0)
MCH: 25.1 pg — ABNORMAL LOW (ref 26.0–34.0)
MCHC: 35.7 g/dL (ref 30.0–36.0)
MCV: 70.5 fL — ABNORMAL LOW (ref 78.0–100.0)
Platelets: 238 10*3/uL (ref 150–400)
RBC: 5.01 MIL/uL (ref 4.22–5.81)
RDW: 15.8 % — AB (ref 11.5–15.5)
WBC: 9.2 10*3/uL (ref 4.0–10.5)

## 2017-07-11 LAB — GLUCOSE, CAPILLARY
GLUCOSE-CAPILLARY: 144 mg/dL — AB (ref 65–99)
Glucose-Capillary: 161 mg/dL — ABNORMAL HIGH (ref 65–99)
Glucose-Capillary: 202 mg/dL — ABNORMAL HIGH (ref 65–99)
Glucose-Capillary: 147 mg/dL — ABNORMAL HIGH (ref 65–99)

## 2017-07-11 LAB — PROTIME-INR
INR: 2.63
Prothrombin Time: 27.9 seconds — ABNORMAL HIGH (ref 11.4–15.2)

## 2017-07-11 MED ORDER — WARFARIN SODIUM 7.5 MG PO TABS
7.5000 mg | ORAL_TABLET | Freq: Once | ORAL | Status: AC
  Administered 2017-07-11: 7.5 mg via ORAL
  Filled 2017-07-11: qty 1

## 2017-07-11 NOTE — Progress Notes (Signed)
Daily Progress Note   Patient Name: [name]       Date: 07/11/2017 DOB: July 27, 1946  Age: 71 y.o. MRN#: [id number] Attending Physician: Barton Dubois, MD Primary Care Physician: Arnoldo Morale, MD Admit Date: 06/30/2017  Reason for Consultation/Follow-up: Establishing goals of care and Psychosocial/spiritual support  Subjective: Daughter at bedside, feeding patient breakfast. She reports that his mental status is same. Nurse at bedside and reports milrinone drip off at 11 pm last night. Daughter requesting medical note to get visa for patient's wife to come see him.   Length of Stay: 10  Current Medications: Scheduled Meds:  . aspirin EC  81 mg Oral Daily  . atorvastatin  80 mg Oral q1800  . digoxin  0.125 mg Oral Daily  . feeding supplement (ENSURE ENLIVE)  237 mL Oral BID BM  . insulin aspart  0-15 Units Subcutaneous TID WC  . insulin aspart  0-5 Units Subcutaneous QHS  . insulin glargine  5 Units Subcutaneous QHS  . multivitamin with minerals  1 tablet Oral Daily  . pantoprazole  40 mg Oral Daily  . QUEtiapine  25 mg Oral BID  . spironolactone  12.5 mg Oral Daily  . Warfarin - Pharmacist Dosing Inpatient   Does not apply q1800    Continuous Infusions:   PRN Meds: acetaminophen, haloperidol lactate, sodium chloride  Physical Exam  Constitutional: No distress.  HENT:  Head: Normocephalic and atraumatic.  Cardiovascular: Intact distal pulses. Tachycardia present.  Pulses:      Radial pulses are 1+ on the right side, and 1+ on the left side.       Dorsalis pedis pulses are 1+ on the right side, and 1+ on the left side.  Pulmonary/Chest: Effort normal. No accessory muscle usage. No tachypnea. No respiratory distress.  Abdominal: Soft. There is no tenderness.  Genitourinary:    Genitourinary Comments: Condom cath in place  Musculoskeletal:       Right lower leg: He exhibits no edema.       Left lower leg: He exhibits no edema.  Neurological: He is alert. He is disoriented.  Skin: He is not diaphoretic.  Cool extremities  Psychiatric: He expresses impulsivity.  Mariana Arn [place], daughter reports he is asking about food He is inattentive.            Vital Signs: BP 99/71 (BP Location: Right Arm)   Pulse (!) 101   Temp 98 F (36.7 C) (Oral)   Resp (!) 33   Ht '5\' 10"'  (1.778 m)   Wt 72.7 kg (160 lb 3.2 oz)   SpO2 97%   BMI 22.99 kg/m  SpO2: SpO2: 97 % O2 Device: O2 Device: Not Delivered O2 Flow Rate: O2 Flow Rate (L/min): 2 L/min  Intake/output summary:   Intake/Output Summary (Last 24 hours) at 07/11/2017 0941 Last data filed at 07/11/2017 0527 Gross per 24 hour  Intake 278.43 ml  Output 550 ml  Net -271.57 ml   LBM: Last BM Date: 07/09/17 Baseline Weight: Weight: 76.7 kg (169 lb) Most recent weight: Weight: 72.7 kg (160 lb 3.2 oz)       Palliative Assessment/Data: 40%    Flowsheet Rows     Most Recent Value  Intake Tab  Referral Department  Hospitalist  Unit at Time of Referral  Intermediate Care Unit  Palliative Care Primary Diagnosis  Cardiac  Date Notified  07/08/17  Palliative Care Type  New Palliative care  Reason for referral  Clarify Goals of Care, Counsel Regarding Hospice  Date of Admission  06/30/17  Date first seen by Palliative Care  07/09/17  # of days Palliative referral response time  1 Day(s)  # of days IP prior to Palliative referral  8  Clinical Assessment  Palliative Performance Scale Score  50%  Psychosocial & Spiritual Assessment  Palliative Care Outcomes  Patient/Family meeting held?  Yes  Who was at the meeting?  patient and daughter  Palliative Care Outcomes  Provided psychosocial or spiritual support      Patient Active Problem List   Diagnosis Date Noted  . Encephalopathy   . Palliative care by  specialist   . Goals of care, counseling/discussion   . CAP (community acquired pneumonia) 07/01/2017  . CAD (coronary artery disease) 05/27/2017  . Chronic combined systolic and diastolic heart failure ([place]) 05/27/2017  . Long term (current) use of anticoagulants [Z79.01] 05/16/2017  . Chronic anticoagulation   . Shortness of breath   . Diabetes mellitus type 2 in nonobese ([name])   . Subtherapeutic anticoagulation   . Neuropathic pain   . AKI (acute kidney injury) ([place])   . Right middle cerebral artery stroke ([place]) 05/05/2017  . Coronary artery disease involving coronary bypass graft of native heart without angina pectoris   . Type 2 diabetes mellitus with peripheral neuropathy ([name])   . Benign essential HTN   . Hyperlipidemia   . Acute blood loss anemia   . Anemia of chronic disease   . History of medication noncompliance   . Ischemia of right lower extremity   . Endotracheally intubated   . Ischemia of left lower extremity   . Respiratory failure ([place])   . Occlusion of left carotid artery   . Urinary retention   . Ischemic cardiomyopathy   . S/P CABG (coronary artery bypass graft)   . Cerebral embolism with cerebral infarction 04/25/2017  . Acute on chronic systolic heart failure ([place]) 04/24/2017  . Hyponatremia 04/24/2017  . CHF (congestive heart failure) ([place]) 04/17/2017    Palliative Care Assessment & Plan   HPI: 71 y.o. male  with past medical history of systolic CHF (EF 30-16% on 07/07/17), CAD (CABG 2007 in [place]), R MCA stroke in September 2018 s/p mechanical thrombectomy, T2DM, HTN, HLD, and a thoracic aneurysm admitted on 06/30/2017 with weight loss and confusion. Per PCP, patient had lost 11 pounds in 1 month and had a poor appetite. Was found to have UTI, possible pneumonia,  and CHF exacerbation. BNP >2000 and signs of volume overload. UTI/?pna treated with IV Unasyn and oral Augmentin. Blood cultures negative.  Patient has continued to experience altered  mental status throughout admission - per daughter confusion has been ongoing for last 3-4 weeks. CT scan unremarkable, MRI rules out acute intracranial abnormality, EEG shows encephalopathy but no seizure activity, ammonia normal, and ABG shows no hypercarbia. Receiving PRN haldol and Seroquel for agitation.   Was diuresed with IV lasix. Pt was on milrinone trial 07/07/17 - 07/10/17. Patient has continued to complain of chest pain and shortness of breath throughout admission. He has also had issues with urinary retention this admission.   Of note, patient is from [place] and speaks [place]. His daughter speaks [place] well.  Patient has been  admitted 4 times in previous 6 months. Daughter has been providing 24/7 care to the patient at home. All other family lives in [place], including other children and patient's wife.  Hospice recommended by cardiology and PMT consulted for [place].  Assessment: Met with daughter and patient at bedside. Daughter reports mental status is about the same as yesterday, but better than it was previously. Waiting to see heart failure team. She continues to state she wants to see how patient does off of milrinone drip. Milrinone off at 11 pm last night, so need more time to see how he does. She does tell me that she spoke with her mom regarding how sick her father is and her mother is going to try to get a visa to come see him. She wants him to be alive long enough to see her mother/his wife. PMT gave daughter letters for family to obtain visas.   D/t family being overseas and actively trying to get here - she thinks they could be here by the end of next week - she wants patient to remain a full code. We further discussed poor prognosis and that if patient were to need resuscitation, high likelihood of him remaining on ventilator afterward if he were to be resuscitated. She understands this, requests he remain full code at least until her family can come to [place] and once they have seen  him she will likely change him to DNR.   We discussed disposition options - it is really important to her that he come home with her. Explained that SNF is recommended for the patient by PT and discussed option of Hospice. She has heard of hospice and understands their services but continues to state she wants to see how he does off of milrinone before discussing options further and is focused on getting her mother to [place] to see patient.   Recommendations/Plan:  PMT gave letter to daughter to assist with obtaining Visa for mother and brother  Will continue to follow along and see how patient does off of milrinone -  This will guide further [place] conversations with daughter  Goals of Care and Additional Recommendations:  Limitations on Scope of Treatment: Full Scope Treatment, for now until family comes  Code Status:  Full code  Prognosis:   Unable to determine  Discharge Planning:  To Be Determined  Care plan was discussed with patient, daughter, CSW  Thank you for allowing the Palliative Medicine Team to assist in the care of this patient.   Total Time 35 minutes Prolonged Time Billed  no       Greater than 50%  of this time was spent counseling and coordinating care related to the above assessment and plan.  Juel Burrow, DNP, AGNP-C Palliative Medicine Team Team Phone # [phone]

## 2017-07-11 NOTE — Progress Notes (Signed)
ANTICOAGULATION CONSULT NOTE - Follow Up Consult  Pharmacy Consult for: Coumadin Indication: h/o CVA  No Known Allergies  Patient Measurements: Height: 5\' 10"  (177.8 cm) Weight: 160 lb 3.2 oz (72.7 kg) IBW/kg (Calculated) : 73  Vital Signs: Temp: 98 F (36.7 C) (11/23 0705) Temp Source: Oral (11/23 0705) BP: 99/71 (11/23 0705) Pulse Rate: 101 (11/23 0937)  Labs: Recent Labs    07/09/17 0409 07/10/17 0330 07/11/17 0346  HGB 12.0* 12.3* 12.6*  HCT 33.6* 34.8* 35.3*  PLT 239 233 238  LABPROT 28.5* 28.3* 27.9*  INR 2.70 2.68 2.63  CREATININE 1.12 1.07 1.00    Estimated Creatinine Clearance: 70.7 mL/min (by C-G formula based on SCr of 1 mg/dL).  Assessment: 71yom continues on coumadin for h/o CVA. INR therapeutic at 2.68. CBC stable. No bleeding.  PTA dose: 11.25 mg on MWF and 7.5 mg AOD's per clinic, however the medication reconciliation states 7.5 mg daily  Goal of Therapy:  INR 2-3 Monitor platelets by anticoagulation protocol: Yes   Plan:  1) Warfarin 7.5mg  tonight 2) Daily INR and CBC  Adline PotterSabrina Benedicto Capozzi, PharmD Pharmacy Resident Pager: [phone]

## 2017-07-11 NOTE — Progress Notes (Signed)
[facility] Progress Note  Patient: [name] ZOX:[id number]   PCP: Jaclyn ShaggyAmao, Enobong, MD DOB: 05/17/1946   DOA: 06/30/2017   DOS: 07/11/2017   Date of Service: the patient was seen and examined on 07/11/2017  Subjective: no fever, no CP, no nausea, no vomiting. Slightly agitated and more tachypneic.   Brief hospital course: Pt. with PMH of CAD, chronic systolic CHF, right MCA stroke, type II DM, HTN; admitted on 06/30/2017, presented with complaint of confusion, was found to have UTI and pneumonia.  So has acute on chronic CHF started on IV milrinone. Currently further plan is continue close monitoring in the stepdown unit for now.  Assessment and Plan: 1.  Acute metabolic encephalopathy. -Likely from UTI as well as pneumonia. Also decrease perfusion in setting of worsening heart failure. -Symptomatology has been ongoing for last 3-4 weeks per patient's daughter. -CT scan unremarkable, ABG shows no hypercarbia. -Troponin negative EKG negative. -No new focal deficit on examination right now other than confusion. -Already treated with antibiotics with IV Unasyn, and oral Augmentin. No fever and non toxic in appearance. -EEG completed,shows metabolic encephalopathy, no active seizure focus. -MRI rules out any acute intracranial abnormality. -Ammonia level mildly elevated, recheck stable. -PRN Haldol, Seroquel.  Discontinuing all other psychotropic medications. -continue holding gabapentin -continue constant re-orientation and supportive care. -overall better according to daughter; but appears more tachypneic and slightly more agitated today.   2.  UTI and community-acquired pneumonia. -Urine with enterococcus sensitive to all antibiotics. -There was a question of patient having pneumonia as well (concerns for aspiration). -treated with unasyn and then Augmentin. -Blood cultures negative fo any bacterial growth. -patient afebrile  3.  Acute on chronic systolic CHF/Acute hypoxic  respiratory failure. -Echocardiogram shows 15-20% EF with diffuse hypokinesis. -Patient was diuresed with IV Lasix. W/o significant improvement. -Pt is already on therapeutic anti-coagulation with warfarin with therapeutic INR. -continue oxygen supplementation and continue weaning off as tolerated. -continue digoxin as per cardiology rec's -continue spironolactone -continue watching off milrinone   4.  Multiple PVCs, -NSVT. -Monitor electrolytes and replete as needed. -once BP and CHF more stable, probably will benefit of resuming B-blcoker; will allow cardiology service to assist with further rec's. Currently BP is soft.  5.  Coronary artery disease. -Two-vessel CABG in 2007. -Monitor on telemetry. -No chest pain  6.  Type II DM. -slight improvement in his appetite today -continue SSI  7.  Recent right MCA stroke in 2018 -S/P mechanical thrombectomy on last admission. -Continue aspirin and Coumadin for secondary prevention  8.  Chest pain. -Most likely demand ischemia in the presence of CHF exacerbation  9.  Goals of care discussion. -Patient with decompensated CHF, progressively worsening mentation without any reversible cause, recent acute CVA. -Patient also with poor p.o. intake and dysphagia 2. -daughter realizing bad shape of her dad's health; palliative care on board and helping with GOC. -further info/prognosis to be determine base on his response off milrinone drip.   DVT Prophylaxis: on Warfarin  Advance goals of care discussion: Still full code  Family Communication: Daughter at bedside  Disposition:  Per cardiology recommendations; best option is hospice care to guaranteed comfort. Patient off  milrinone drip; slightly more tachypneic, even maintaining good O2 sat. Continue digoxin and continue spironolactone. Will follow clinical response and further decide discharge options.   Consultants: cardiology  Procedures: EEG, Echocardiogram    Antibiotics: Anti-infectives (From admission, onward)   Start     Dose/Rate Route Frequency Ordered Stop   07/04/17 1000  amoxicillin-clavulanate (AUGMENTIN) 500-125 MG per tablet 500 mg     1 tablet Oral 3 times daily 07/04/17 0917 07/09/17 2101   07/03/17 1200  Ampicillin-Sulbactam (UNASYN) 3 g in sodium chloride 0.9 % 100 mL IVPB  Status:  Discontinued     3 g 200 mL/hr over 30 Minutes Intravenous Every 8 hours 07/03/17 1159 07/04/17 0917   07/02/17 2000  azithromycin (ZITHROMAX) tablet 250 mg  Status:  Discontinued     250 mg Oral Daily 07/01/17 1050 07/04/17 0911   07/02/17 1830  ciprofloxacin (CIPRO) IVPB 400 mg  Status:  Discontinued     400 mg 200 mL/hr over 60 Minutes Intravenous Every 12 hours 07/02/17 1742 07/03/17 1159   07/01/17 2200  cefTRIAXone (ROCEPHIN) 1 g in dextrose 5 % 50 mL IVPB  Status:  Discontinued     1 g 100 mL/hr over 30 Minutes Intravenous Every 24 hours 07/01/17 1057 07/03/17 1158   07/01/17 2000  azithromycin (ZITHROMAX) tablet 500 mg     500 mg Oral Daily 07/01/17 1050 07/01/17 2000   07/01/17 0100  cefTRIAXone (ROCEPHIN) 1 g in dextrose 5 % 50 mL IVPB  Status:  Discontinued     1 g 100 mL/hr over 30 Minutes Intravenous Daily at bedtime 07/01/17 0050 07/01/17 0334   07/01/17 0100  azithromycin (ZITHROMAX) tablet 500 mg  Status:  Discontinued     500 mg Oral Daily at bedtime 07/01/17 0050 07/01/17 0334      Objective: Physical Exam: Vitals:   07/11/17 0507 07/11/17 0705 07/11/17 0937 07/11/17 1500  BP: 101/72 99/71  94/67  Pulse: 96 98 (!) 101 93  Resp: (!) 27 (!) 33  (!) 22  Temp: 98.2 F (36.8 C) 98 F (36.7 C)  (!) 97.5 F (36.4 C)  TempSrc: Axillary Oral  Axillary  SpO2: 96% 97%  100%  Weight: 72.7 kg (160 lb 3.2 oz)     Height:        Intake/Output Summary (Last 24 hours) at 07/11/2017 1837 Last data filed at 07/11/2017 1200 Gross per 24 hour  Intake 518.43 ml  Output 650 ml  Net -131.57 ml   Filed Weights   07/09/17 0344  07/10/17 0518 07/11/17 0507  Weight: 72.8 kg (160 lb 9.6 oz) 71.9 kg (158 lb 9.6 oz) 72.7 kg (160 lb 3.2 oz)   General: no fever, no CP, no nausea, no vomiting. Slightly more agitated and tachypneic than yesterday. Oriented X2. Cardiovascular: RRR, no rubs, no gallops Respiratory: overall breathing improving; but more tachypneic today than yesterday. No frank crackles, no wheezing.  Abdomen: soft, NT, ND, positive BS. Extremities: no edema, no cyanosis, no clubbing. Moving four limbs spontaneously; even right side with residual paresis from previous stroke and some muscular wasting appearance.   Data Reviewed: CBC: Recent Labs  Lab 07/05/17 0727 07/07/17 0444 07/08/17 0447 07/09/17 0409 07/10/17 0330 07/11/17 0346  WBC 6.0 7.9 8.5 9.1 8.7 9.2  NEUTROABS 3.1 4.8  --   --   --   --   HGB 12.1* 11.6* 11.6* 12.0* 12.3* 12.6*  HCT 32.9* 32.4* 32.8* 33.6* 34.8* 35.3*  MCV 69.4* 68.9* 69.6* 70.4* 69.9* 70.5*  PLT 233 232 209 239 233 238   Basic Metabolic Panel: Recent Labs  Lab 07/05/17 0216  07/07/17 0444 07/08/17 0447 07/09/17 0409 07/10/17 0330 07/11/17 0346  NA  --    < > 140 139 139 139 137  K  --    < > 3.7 3.5 3.7 3.9  4.0  CL  --    < > 106 104 104 104 105  CO2  --    < > 25 24 25 25  21*  GLUCOSE  --    < > 166* 171* 156* 156* 162*  BUN  --    < > 28* 26* 25* 23* 22*  CREATININE  --    < > 1.04 1.03 1.12 1.07 1.00  CALCIUM  --    < > 9.3 9.0 9.2 9.2 9.2  MG 2.2  --   --  2.0  --   --   --    < > = values in this interval not displayed.    Liver Function Tests: Recent Labs  Lab 07/05/17 0727 07/07/17 0444  AST 57* 29  ALT 91* 54  ALKPHOS 213* 176*  BILITOT 1.0 1.6*  PROT 6.4* 6.1*  ALBUMIN 3.5 3.4*    Recent Labs  Lab 07/05/17 0216 07/09/17 0409 07/10/17 0330  AMMONIA 16 22 16    Coagulation Profile: Recent Labs  Lab 07/07/17 0444 07/08/17 0447 07/09/17 0409 07/10/17 0330 07/11/17 0346  INR 2.57 2.56 2.70 2.68 2.63   Cardiac Enzymes: Recent  Labs  Lab 07/05/17 0216 07/05/17 0727 07/06/17 0045 07/06/17 0524 07/06/17 1133  TROPONINI <0.03 <0.03 <0.03 0.03* 0.03*   CBG: Recent Labs  Lab 07/10/17 1653 07/10/17 2050 07/11/17 0820 07/11/17 1153 07/11/17 1557  GLUCAP 147* 147* 161* 202* 147*   Studies: No results found.  Scheduled Meds: . aspirin EC  81 mg Oral Daily  . atorvastatin  80 mg Oral q1800  . digoxin  0.125 mg Oral Daily  . feeding supplement (ENSURE ENLIVE)  237 mL Oral BID BM  . insulin aspart  0-15 Units Subcutaneous TID WC  . insulin aspart  0-5 Units Subcutaneous QHS  . insulin glargine  5 Units Subcutaneous QHS  . multivitamin with minerals  1 tablet Oral Daily  . pantoprazole  40 mg Oral Daily  . QUEtiapine  25 mg Oral BID  . spironolactone  12.5 mg Oral Daily  . Warfarin - Pharmacist Dosing Inpatient   Does not apply q1800   Continuous Infusions:  PRN Meds: acetaminophen, haloperidol lactate, sodium chloride  Time spent: 25 min  Author: Vassie Loll, MD [facility] Pager: [phone] 07/11/2017 6:37 PM  If 7PM-7AM, please contact night-coverage at [email or web address], password [facility]

## 2017-07-11 NOTE — Progress Notes (Signed)
Advanced Heart Failure Rounding Note  PCP: Dr. Venetia NightAmao Primary Cardiologist: Dr. Jens Somrenshaw HF: New (Dr. Gala RomneyBensimhon)  Subjective:    Daughter present and acts as Nurse, learning disabilitytranslator.   Urine culture with pan-sensitive Enteroccus Faecalis. Has been augmentin.   Milrinone stopped yesterday. He looks more agitated and tachypneic to me but she feels he is unchanged.    Objective:   Weight Range: 72.7 kg (160 lb 3.2 oz) Body mass index is 22.99 kg/m.   Vital Signs:   Temp:  [97.8 F (36.6 C)-98.4 F (36.9 C)] 98 F (36.7 C) (11/23 0705) Pulse Rate:  [89-102] 101 (11/23 0937) Resp:  [15-33] 33 (11/23 0705) BP: (93-109)/(67-82) 99/71 (11/23 0705) SpO2:  [96 %-100 %] 97 % (11/23 0705) Weight:  [72.7 kg (160 lb 3.2 oz)] 72.7 kg (160 lb 3.2 oz) (11/23 0507) Last BM Date: 07/09/17  Weight change: Filed Weights   07/09/17 0344 07/10/17 0518 07/11/17 0507  Weight: 72.8 kg (160 lb 9.6 oz) 71.9 kg (158 lb 9.6 oz) 72.7 kg (160 lb 3.2 oz)    Intake/Output:   Intake/Output Summary (Last 24 hours) at 07/11/2017 1211 Last data filed at 07/11/2017 0527 Gross per 24 hour  Intake 278.43 ml  Output 550 ml  Net -271.57 ml      Physical Exam    General:  Sitting up in bed. Weak appearing. Agitated HEENT: normal Neck: supple JVP 6-7 Carotids 2+ bilat; no bruits. No lymphadenopathy or thryomegaly appreciated. Cor: PMI laterally displaced. RRR tachy + s3 Lungs: clear anteriorly  Abdomen: soft NT/ND good BS. No hsm  Extremities: no cyanosis, clubbing, rash, edema Wasting of RLE  Eschar on 3rd toe of R foot.  Neuro: awake. Seems to respond to commands but agitated. Weak RLE  Telemetry    S tach 100-110  Personally reviewed   EKG    N/A  Labs    CBC Recent Labs    07/10/17 0330 07/11/17 0346  WBC 8.7 9.2  HGB 12.3* 12.6*  HCT 34.8* 35.3*  MCV 69.9* 70.5*  PLT 233 238   Basic Metabolic Panel Recent Labs    16/05/9610/22/18 0330 07/11/17 0346  NA 139 137  K 3.9 4.0  CL 104 105    CO2 25 21*  GLUCOSE 156* 162*  BUN 23* 22*  CREATININE 1.07 1.00  CALCIUM 9.2 9.2   Liver Function Tests No results for input(s): AST, ALT, ALKPHOS, BILITOT, PROT, ALBUMIN in the last 72 hours. No results for input(s): LIPASE, AMYLASE in the last 72 hours. Cardiac Enzymes No results for input(s): CKTOTAL, CKMB, CKMBINDEX, TROPONINI in the last 72 hours.  BNP: BNP (last 3 results) Recent Labs    05/27/17 1131 06/30/17 2200 07/06/17 0045  BNP 1,040.4* 2,434.3* 3,837.7*    ProBNP (last 3 results) No results for input(s): PROBNP in the last 8760 hours.   D-Dimer No results for input(s): DDIMER in the last 72 hours. Hemoglobin A1C No results for input(s): HGBA1C in the last 72 hours. Fasting Lipid Panel No results for input(s): CHOL, HDL, LDLCALC, TRIG, CHOLHDL, LDLDIRECT in the last 72 hours. Thyroid Function Tests No results for input(s): TSH, T4TOTAL, T3FREE, THYROIDAB in the last 72 hours.  Invalid input(s): FREET3  Other results:   Imaging    No results found.  Medications:    Scheduled Medications: . aspirin EC  81 mg Oral Daily  . atorvastatin  80 mg Oral q1800  . digoxin  0.125 mg Oral Daily  . feeding supplement (ENSURE ENLIVE)  237 mL Oral BID BM  . insulin aspart  0-15 Units Subcutaneous TID WC  . insulin aspart  0-5 Units Subcutaneous QHS  . insulin glargine  5 Units Subcutaneous QHS  . multivitamin with minerals  1 tablet Oral Daily  . pantoprazole  40 mg Oral Daily  . QUEtiapine  25 mg Oral BID  . spironolactone  12.5 mg Oral Daily  . Warfarin - Pharmacist Dosing Inpatient   Does not apply q1800    Infusions:   PRN Medications: acetaminophen, haloperidol lactate, sodium chloride  Patient Profile   [name] is a 71 y/o M with history of CAD (s/p CABG in 2007 in [place]), chronic systolic CHF (EF 09-81%20-25% in 03/2017),, thoracic aortic aneurysm 03/2017 (4.2 vs 4.6cm by CTs Aug/Sept 2018), cerebrovascular disease, HTN, HLD, Type 2 DM, stroke  (~2016, also 04/2017 with acute R MCA occlusion and R popliteal embolic occlusion, placed on Coumadin), PVCs by prior telemetry who is being seen today for the evaluation of CHF at the request of Dr. Allena KatzPatel.   He was admitted with weight loss, worsening confusion for several weeks, weakness, dyspnea. With diuresis has had intermittent hypotension.  Assessment/Plan   1. Acute encephalopathy - Suspect multifactorial previous CVA, UTI and HF - Seems a bit worse to me off milrinone but daughter feels he is stable  2. Acute on chronic systolic CHF  - Echo 07/07/17 LVEF 15-20%, Trivial AI, Severe LAE, Mod RV dilation, Mod/Sev RAE, P peak pressure 40 mmHg. - Milrinone stopped yesterday and he seems more tenuous today. I feel he is truly end-stage. Not candidate for home milrinone or advanced therapies due to CVA and comorbidities - Continue digoxin. Continue spiro - No bb or ACE/ARB/ARNI with low output and low BP - Can consider hydral/nitrates as BP tolerates - Hospice is the best option 3. Intermittent hypotension concerning for low output - Pressures remain soft in 90-100s. Treat as above.  4. CAD as above with minimal troponin bump (0.03) - CP resolved.  - No indication for cath at this time.  5. H/o embolic stroke and lower extremity embolism in 04/2017 - This was very debilitating and he has made very little progress in recovery. No change.  - Continue warfarin, low dose ASA and statin. 6. UTI - Pan sensitive enterococcus.  - On augmentin.  7. GOC - Palliative care has seen.  - PT recommending SNF - Appreciate Palliative Care input. I had a long talk with his daughter earlier this admit and she understands the gravity of her father's situation but she feels sad that she brought her father here from [name] to see her four children and he has fell ill since that time. She realizes he is quite sick but really wants to find a way to get him home to [name] to die among his family. I told her that  without the assistance of an air ambulance I did not think we could find a way to do this. Will continue to monitor. SW involved. Prognosis overall quite poor. Agree with Hospice Care as best option to ensure his comfort.   Length of Stay: 10  Arvilla Meresaniel Angelee Bahr, MD  07/11/2017, 12:11 PM  Advanced Heart Failure Team Pager [phone] (M-F; 7a - 4p)  Please contact [facility] for night-coverage after hours (4p -7a ) and weekends on [email or web address]

## 2017-07-12 DIAGNOSIS — J181 Lobar pneumonia, unspecified organism: Secondary | ICD-10-CM

## 2017-07-12 LAB — CBC
HEMATOCRIT: 34.3 % — AB (ref 39.0–52.0)
HEMOGLOBIN: 12.5 g/dL — AB (ref 13.0–17.0)
MCH: 25.3 pg — AB (ref 26.0–34.0)
MCHC: 36.4 g/dL — ABNORMAL HIGH (ref 30.0–36.0)
MCV: 69.4 fL — AB (ref 78.0–100.0)
Platelets: 231 10*3/uL (ref 150–400)
RBC: 4.94 MIL/uL (ref 4.22–5.81)
RDW: 15.6 % — ABNORMAL HIGH (ref 11.5–15.5)
WBC: 8.4 10*3/uL (ref 4.0–10.5)

## 2017-07-12 LAB — GLUCOSE, CAPILLARY
GLUCOSE-CAPILLARY: 170 mg/dL — AB (ref 65–99)
GLUCOSE-CAPILLARY: 147 mg/dL — AB (ref 65–99)
Glucose-Capillary: 257 mg/dL — ABNORMAL HIGH (ref 65–99)
Glucose-Capillary: 178 mg/dL — ABNORMAL HIGH (ref 65–99)

## 2017-07-12 LAB — BASIC METABOLIC PANEL
ANION GAP: 10 (ref 5–15)
BUN: 24 mg/dL — ABNORMAL HIGH (ref 6–20)
CHLORIDE: 103 mmol/L (ref 101–111)
CO2: 23 mmol/L (ref 22–32)
Calcium: 9 mg/dL (ref 8.9–10.3)
Creatinine, Ser: 1.02 mg/dL (ref 0.61–1.24)
GFR calc non Af Amer: >60 mL/min (ref >60)
GLUCOSE: 154 mg/dL — AB (ref 65–99)
POTASSIUM: 4.1 mmol/L (ref 3.5–5.1)
Sodium: 136 mmol/L (ref 135–145)

## 2017-07-12 LAB — PROTIME-INR
INR: 2.78
Prothrombin Time: 29.2 seconds — ABNORMAL HIGH (ref 11.4–15.2)

## 2017-07-12 MED ORDER — WARFARIN SODIUM 7.5 MG PO TABS
7.5000 mg | ORAL_TABLET | Freq: Once | ORAL | Status: AC
  Administered 2017-07-12: 7.5 mg via ORAL
  Filled 2017-07-12: qty 1

## 2017-07-12 NOTE — Progress Notes (Signed)
ANTICOAGULATION CONSULT NOTE - Follow Up Consult  Pharmacy Consult for: Coumadin Indication: h/o CVA  No Known Allergies  Patient Measurements: Height: 5\' 10"  (177.8 cm) Weight: 148 lb 11.2 oz (67.4 kg) IBW/kg (Calculated) : 73  Vital Signs: Temp: 97.7 F (36.5 C) (11/24 0811) Temp Source: Axillary (11/24 0811) BP: 105/70 (11/24 0811) Pulse Rate: 104 (11/24 0958)  Labs: Recent Labs    07/10/17 0330 07/11/17 0346 07/12/17 0452  HGB 12.3* 12.6* 12.5*  HCT 34.8* 35.3* 34.3*  PLT 233 238 231  LABPROT 28.3* 27.9* 29.2*  INR 2.68 2.63 2.78  CREATININE 1.07 1.00 1.02    Estimated Creatinine Clearance: 64.3 mL/min (by C-G formula based on SCr of 1.02 mg/dL).  Assessment: 71yom continues on coumadin for h/o CVA. INR therapeutic at 2.78. CBC stable. No bleeding.  PTA dose: 11.25 mg on MWF and 7.5 mg AOD's per clinic, however the medication reconciliation states 7.5 mg daily  Goal of Therapy:  INR 2-3 Monitor platelets by anticoagulation protocol: Yes   Plan:  1) Warfarin 7.5mg  tonight 2) Daily INR and CBC  Adline PotterSabrina Shavonda Wiedman, PharmD Pharmacy Resident Pager: [phone]

## 2017-07-12 NOTE — Progress Notes (Signed)
[facility] Progress Note  Patient: [name] RUE:[id number]   PCP: Jaclyn ShaggyAmao, Enobong, MD DOB: Dec 06, 1945   DOA: 06/30/2017   DOS: 07/12/2017   Date of Service: the patient was seen and examined on 07/12/2017  Subjective: Afebrile, no chest pain, slightly tachypneic and intermittently confused.  No nausea, no vomiting able to eat only 50% of his meals.  Brief hospital course: Pt. with PMH of CAD, chronic systolic CHF, right MCA stroke, type II DM, HTN; admitted on 06/30/2017, presented with complaint of confusion, was found to have UTI and pneumonia.  So has acute on chronic CHF started on IV milrinone. Currently further plan is continue close monitoring in the stepdown unit for now.  Assessment and Plan: 1.  Acute metabolic encephalopathy. -Likely from UTI as well as pneumonia. Also decrease perfusion in setting of worsening heart failure. -Symptomatology has been ongoing for last 3-4 weeks per patient's daughter. -CT scan unremarkable, ABG shows no hypercarbia. -Troponin negative EKG negative. -No new focal deficit on examination right now other than confusion. -Already treated with antibiotics with IV Unasyn, and oral Augmentin. No fever and non toxic in appearance. -EEG completed,shows metabolic encephalopathy, no active seizure focus. -MRI rules out any acute intracranial abnormality. -Ammonia level mildly elevated, recheck stable. -PRN Haldol, Seroquel.  Discontinuing all other psychotropic medications. -continue holding gabapentin -continue constant re-orientation and supportive care. -overall better according to daughter; but continue to be intermittently confuse, tachypneic and requiring oxygen supplementation.    2.  UTI and community-acquired pneumonia. -Urine with enterococcus sensitive to all antibiotics. -There was a question of patient having pneumonia as well (concerns for aspiration). -Patient completed antibiotic treatment using unasyn and then Augmentin. -Blood  cultures negative fo any bacterial growth. -patient has remained afebrile  3.  Acute on chronic systolic CHF/Acute hypoxic respiratory failure. -Echocardiogram shows 15-20% EF with diffuse hypokinesis. -Patient was diuresed with IV Lasix. W/o significant improvement and required to be started on milrinone.  At that moment he had good response and diuresis while maintaining blood pressure. -Pt is already on therapeutic anti-coagulation with warfarin (INR therapeutic). -continue oxygen supplementation and continue weaning off as tolerated. -continue digoxin and spironolactone as per cardiology rec's -will continue watching off milrinone   4.  Multiple PVCs, -NSVT. -Monitor electrolytes and replete as needed. -once BP and CHF more stable, probably will benefit of resuming B-blcoker; will allow cardiology service to assist with further rec's. Currently BP is too soft.  5.  Coronary artery disease. -Two-vessel CABG in 2007. -Continue telemetry monitoring -Patient has remained chest pain-free -Continue aspirin and statins; no beta-blocker secondary to low blood pressure.  6.  Type II DM. -Continue sliding scale insulin -Follow CVG and further adjust hypoglycemic regimen as needed -Patient barely eating 50% of his meals.  7.  Recent right MCA stroke in 2018 -S/P mechanical thrombectomy on last admission. -Will continue aspirin Coumadin for secondary prevention  -Continue statin  8.  Chest pain. -Most likely demand ischemia in the presence of CHF exacerbation -Currently chest pain-free  9.  Goals of care discussion. -Patient with decompensated CHF, progressively worsening mentation without any reversible cause, recent acute CVA. -Patient also with poor p.o. intake and dysphagia 2. -daughter realizing bad shape of her dad's health; palliative care on board and helping with GOC. -Overall poor prognosis, And at this moment I will anticipate discharge to a skilled nursing facility with  palliative care follow him.   DVT Prophylaxis: on Warfarin  Advance goals of care discussion: Still full code  Family Communication: Daughter at bedside  Disposition:  Per cardiology recommendations; best option is hospice care to guaranteed comfort. Patient off  milrinone drip; slightly more tachypneic, even maintaining good O2 sat. Continue digoxin and continue spironolactone. Will follow clinical response and further decide discharge options.   Consultants: cardiology  Procedures: EEG, Echocardiogram   Antibiotics: Anti-infectives (From admission, onward)   Start     Dose/Rate Route Frequency Ordered Stop   07/04/17 1000  amoxicillin-clavulanate (AUGMENTIN) 500-125 MG per tablet 500 mg     1 tablet Oral 3 times daily 07/04/17 0917 07/09/17 2101   07/03/17 1200  Ampicillin-Sulbactam (UNASYN) 3 g in sodium chloride 0.9 % 100 mL IVPB  Status:  Discontinued     3 g 200 mL/hr over 30 Minutes Intravenous Every 8 hours 07/03/17 1159 07/04/17 0917   07/02/17 2000  azithromycin (ZITHROMAX) tablet 250 mg  Status:  Discontinued     250 mg Oral Daily 07/01/17 1050 07/04/17 0911   07/02/17 1830  ciprofloxacin (CIPRO) IVPB 400 mg  Status:  Discontinued     400 mg 200 mL/hr over 60 Minutes Intravenous Every 12 hours 07/02/17 1742 07/03/17 1159   07/01/17 2200  cefTRIAXone (ROCEPHIN) 1 g in dextrose 5 % 50 mL IVPB  Status:  Discontinued     1 g 100 mL/hr over 30 Minutes Intravenous Every 24 hours 07/01/17 1057 07/03/17 1158   07/01/17 2000  azithromycin (ZITHROMAX) tablet 500 mg     500 mg Oral Daily 07/01/17 1050 07/01/17 2000   07/01/17 0100  cefTRIAXone (ROCEPHIN) 1 g in dextrose 5 % 50 mL IVPB  Status:  Discontinued     1 g 100 mL/hr over 30 Minutes Intravenous Daily at bedtime 07/01/17 0050 07/01/17 0334   07/01/17 0100  azithromycin (ZITHROMAX) tablet 500 mg  Status:  Discontinued     500 mg Oral Daily at bedtime 07/01/17 0050 07/01/17 0334      Objective: Physical Exam: Vitals:    07/12/17 0811 07/12/17 0958 07/12/17 1322 07/12/17 1655  BP: 105/70  (!) 88/60 95/80  Pulse: 97 (!) 104 94   Resp: 18  17 (!) 25  Temp: 97.7 F (36.5 C)  (!) 97.5 F (36.4 C) 98.5 F (36.9 C)  TempSrc: Axillary  Oral Oral  SpO2:    99%  Weight:      Height:        Intake/Output Summary (Last 24 hours) at 07/12/2017 1741 Last data filed at 07/12/2017 1543 Gross per 24 hour  Intake 240 ml  Output 550 ml  Net -310 ml   Filed Weights   07/10/17 0518 07/11/17 0507 07/12/17 0500  Weight: 71.9 kg (158 lb 9.6 oz) 72.7 kg (160 lb 3.2 oz) 67.4 kg (148 lb 11.2 oz)   General: Afebrile, somnolent and is slightly confused.  No chest pain, no nausea, no vomiting.  Patient appears to be tachypneic and per daughter report without too much appetite today . Cardiovascular: RRR, no rubs, no gallops, no murmurs.   Respiratory: No wheezing, no rhonchi, decreased breath sounds at the bases (but no hearing frank crackles).  Positive tachypnea Abdomen: Soft, nontender, nondistended, positive bowel sounds. Extremities: Trace edema bilaterally, no cyanosis, no clubbing.   Neurologic exam: Weak on his right side (leg more than arm); slightly confused on my evaluation but according to daughter oriented x3 intermittently.   Data Reviewed: CBC: Recent Labs  Lab 07/07/17 0444 07/08/17 0447 07/09/17 0409 07/10/17 0330 07/11/17 0346 07/12/17 0452  WBC 7.9 8.5  9.1 8.7 9.2 8.4  NEUTROABS 4.8  --   --   --   --   --   HGB 11.6* 11.6* 12.0* 12.3* 12.6* 12.5*  HCT 32.4* 32.8* 33.6* 34.8* 35.3* 34.3*  MCV 68.9* 69.6* 70.4* 69.9* 70.5* 69.4*  PLT 232 209 239 233 238 231   Basic Metabolic Panel: Recent Labs  Lab 07/08/17 0447 07/09/17 0409 07/10/17 0330 07/11/17 0346 07/12/17 0452  NA 139 139 139 137 136  K 3.5 3.7 3.9 4.0 4.1  CL 104 104 104 105 103  CO2 24 25 25  21* 23  GLUCOSE 171* 156* 156* 162* 154*  BUN 26* 25* 23* 22* 24*  CREATININE 1.03 1.12 1.07 1.00 1.02  CALCIUM 9.0 9.2 9.2 9.2 9.0    MG 2.0  --   --   --   --     Liver Function Tests: Recent Labs  Lab 07/07/17 0444  AST 29  ALT 54  ALKPHOS 176*  BILITOT 1.6*  PROT 6.1*  ALBUMIN 3.4*    Recent Labs  Lab 07/09/17 0409 07/10/17 0330  AMMONIA 22 16   Coagulation Profile: Recent Labs  Lab 07/08/17 0447 07/09/17 0409 07/10/17 0330 07/11/17 0346 07/12/17 0452  INR 2.56 2.70 2.68 2.63 2.78   Cardiac Enzymes: Recent Labs  Lab 07/06/17 0045 07/06/17 0524 07/06/17 1133  TROPONINI <0.03 0.03* 0.03*   CBG: Recent Labs  Lab 07/11/17 1557 07/11/17 2049 07/12/17 0731 07/12/17 1217 07/12/17 1650  GLUCAP 147* 144* 170* 257* 178*   Studies: No results found.  Scheduled Meds: . aspirin EC  81 mg Oral Daily  . atorvastatin  80 mg Oral q1800  . digoxin  0.125 mg Oral Daily  . feeding supplement (ENSURE ENLIVE)  237 mL Oral BID BM  . insulin aspart  0-15 Units Subcutaneous TID WC  . insulin aspart  0-5 Units Subcutaneous QHS  . insulin glargine  5 Units Subcutaneous QHS  . multivitamin with minerals  1 tablet Oral Daily  . pantoprazole  40 mg Oral Daily  . QUEtiapine  25 mg Oral BID  . spironolactone  12.5 mg Oral Daily  . Warfarin - Pharmacist Dosing Inpatient   Does not apply q1800   Continuous Infusions:  PRN Meds: acetaminophen, haloperidol lactate, sodium chloride  Time spent: 25 min  Author: Vassie Lollarlos Mishawn Hemann, MD [facility] Pager: [phone] 07/12/2017 5:41 PM  If 7PM-7AM, please contact night-coverage at [email or web address], password [email or web address]

## 2017-07-12 NOTE — Progress Notes (Signed)
PCP: Dr. Venetia Sanchez Primary Cardiologist: Dr. Jens Sanchez HF: New (Dr. Gala Sanchez)  Subjective:    Doing a bit better no dyspnea or chest pain Really has only been OOB to weight     Objective:   Weight Range: 148 lb 11.2 oz (67.4 kg) Body mass index is 21.34 kg/m.   Vital Signs:   Temp:  [97.5 F (36.4 C)-98.6 F (37 C)] 97.7 F (36.5 C) (11/24 0811) Pulse Rate:  [93-101] 97 (11/24 0811) Resp:  [16-22] 18 (11/24 0811) BP: (94-109)/(62-70) 105/70 (11/24 0811) SpO2:  [95 %-100 %] 95 % (11/24 0445) Weight:  [148 lb 11.2 oz (67.4 kg)] 148 lb 11.2 oz (67.4 kg) (11/24 0500) Last BM Date: 07/09/17  Weight change: Filed Weights   07/10/17 0518 07/11/17 0507 07/12/17 0500  Weight: 158 lb 9.6 oz (71.9 kg) 160 lb 3.2 oz (72.7 kg) 148 lb 11.2 oz (67.4 kg)    Intake/Output:   Intake/Output Summary (Last 24 hours) at 07/12/2017 0843 Last data filed at 07/12/2017 0500 Gross per 24 hour  Intake 480 ml  Output 250 ml  Net 230 ml      Physical Exam    Affect less agitated and confused  Chronically sick black [name] male  HEENT: normal Neck supple with no adenopathy JVP normal no bruits no thyromegaly Lungs basilar crackles no wheezing and good diaphragmatic motion Heart:  S1/S2 no murmur, no rub, gallop or click PMI normal Abdomen: benighn, BS positve, no tenderness, no AAA no bruit.  No HSM or HJR Distal pulses intact with no bruits Trace LE  edema Neuro RLE weak  Skin warm and dry    Telemetry    SR/ST PVC 07/12/2017  Personally reviewed   EKG   ST poor R wave progression no acute ST changes    Labs    CBC Recent Labs    07/11/17 0346 07/12/17 0452  WBC 9.2 8.4  HGB 12.6* 12.5*  HCT 35.3* 34.3*  MCV 70.5* 69.4*  PLT 238 231   Basic Metabolic Panel Recent Labs    20/25/4211/23/18 0346 07/12/17 0452  NA 137 136  K 4.0 4.1  CL 105 103  CO2 21* 23  GLUCOSE 162* 154*  BUN 22* 24*  CREATININE 1.00 1.02  CALCIUM 9.2 9.0   Liver Function Tests No results  for input(s): AST, ALT, ALKPHOS, BILITOT, PROT, ALBUMIN in the last 72 hours. No results for input(s): LIPASE, AMYLASE in the last 72 hours. Cardiac Enzymes No results for input(s): CKTOTAL, CKMB, CKMBINDEX, TROPONINI in the last 72 hours.  BNP: BNP (last 3 results) Recent Labs    05/27/17 1131 06/30/17 2200 07/06/17 0045  BNP 1,040.4* 2,434.3* 3,837.7*    ProBNP (last 3 results) No results for input(s): PROBNP in the last 8760 hours.   D-Dimer No results for input(s): DDIMER in the last 72 hours. Hemoglobin A1C No results for input(s): HGBA1C in the last 72 hours. Fasting Lipid Panel No results for input(s): CHOL, HDL, LDLCALC, TRIG, CHOLHDL, LDLDIRECT in the last 72 hours. Thyroid Function Tests No results for input(s): TSH, T4TOTAL, T3FREE, THYROIDAB in the last 72 hours.  Invalid input(s): FREET3  Other results:   Imaging    No results found.  Medications:    Scheduled Medications: . aspirin EC  81 mg Oral Daily  . atorvastatin  80 mg Oral q1800  . digoxin  0.125 mg Oral Daily  . feeding supplement (ENSURE ENLIVE)  237 mL Oral BID BM  . insulin aspart  0-15  Units Subcutaneous TID WC  . insulin aspart  0-5 Units Subcutaneous QHS  . insulin glargine  5 Units Subcutaneous QHS  . multivitamin with minerals  1 tablet Oral Daily  . pantoprazole  40 mg Oral Daily  . QUEtiapine  25 mg Oral BID  . spironolactone  12.5 mg Oral Daily  . Warfarin - Pharmacist Dosing Inpatient   Does not apply q1800    Infusions:   PRN Medications: acetaminophen, haloperidol lactate, sodium chloride  Patient Profile   [name] is a 71 y/o M with history of CAD (s/p CABG in 2007 in [place]), chronic systolic CHF (EF 96-29%20-25% in 03/2017),, thoracic aortic aneurysm 03/2017 (4.2 vs 4.6cm by CTs Aug/Sept 2018), cerebrovascular disease, HTN, HLD, Type 2 DM, stroke (~2016, also 04/2017 with acute R MCA occlusion and R popliteal embolic occlusion, placed on Coumadin), PVCs by prior  telemetry who is being seen today for the evaluation of CHF at the request of Dr. Allena Sanchez.   He was admitted with weight loss, worsening confusion for several weeks, weakness, dyspnea. With diuresis has had intermittent hypotension.  Assessment/Plan   1. Acute encephalopathy - Suspect multifactorial previous CVA, UTI and HF improved   2. Acute on chronic systolic CHF  - Echo 07/07/17 LVEF 15-20%, off milrinone   3. Intermittent hypotension concerning for low output - Pressures remain soft in 90-100s. Treat as above.   4. CAD as above with minimal troponin bump (0.03) - CP resolved.  - No indication for cath at this time.   5. H/o embolic stroke and lower extremity embolism in 04/2017 - This was very debilitating and he has made very little progress in recovery. No change.  - Continue warfarin, low dose ASA and statin.  6. UTI - Pan sensitive enterococcus.  - On augmentin.   7. GOC - Palliative care has seen.  - SNF needs PT/OT  CHF team will see again on Monday  [name]

## 2017-07-12 NOTE — Plan of Care (Signed)
Pt endorses poor appetite. Consumes 0-25% of meals. Nutrition supplemented with Glucerna shakes

## 2017-07-12 NOTE — Progress Notes (Signed)
Physical Therapy Treatment Patient Details Name: [name] MRN: [id number] DOB: December 09, 1945 Today's Date: 07/12/2017    History of Present Illness Pt is a 71 y/o male who presents with AMS ~1 week. He was found to have CAP, UTI, encephalopathy, and acute on chronic CHF. 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    PT Comments    Use daughter to interpret non medical/non-technical information.  Pt a little impulsive and trying to rush sloppily through the activities to get to the chair, but when forced to slow down participated well in sitting balance, standing and transfers.  Pt tending to list Right and have trouble shifting left to midline.  Some improvement in midline perception after standing trial.    Follow Up Recommendations  SNF;Supervision/Assistance - 24 hour     Equipment Recommendations  3in1 (PT)    Recommendations for Other Services       Precautions / Restrictions Precautions Precautions: Fall Precaution Comments: Residual L side weakness from CVA in September 2018.  Restrictions Weight Bearing Restrictions: No    Mobility  Bed Mobility Overal bed mobility: Needs Assistance Bed Mobility: Supine to Sit   Sidelying to sit: Mod assist       General bed mobility comments: Cues for sequencing, truncal assist for stability, ihibiting push Left and helping pt generate forward momentum to scoot to EOB  Transfers Overall transfer level: Needs assistance Equipment used: 2 person hand held assist;Rolling walker (2 wheeled) Transfers: Sit to/from [employer] CorporationStand;Stand Pivot Transfers Sit to Stand: Max assist;+2 physical assistance Stand pivot transfers: Max assist;+2 physical assistance       General transfer comment: Cues for technique, assist to help keep trunk in midline alignment.  Assist to come forwa and up.  Minor control at L knee.  Ambulation/Gait                 Stairs            Wheelchair Mobility     Modified Rankin (Stroke Patients Only)       Balance Overall balance assessment: Needs assistance Sitting-balance support: Feet supported;Single extremity supported Sitting balance-Leahy Scale: Poor Sitting balance - Comments: Worked at EOB on upright midline orientation and stability.  Pt tending to list R with difficultyw/shifting onto L side.     Standing balance-Leahy Scale: Poor Standing balance comment: 2 person assist for truncal control and support.  pt able to attain full upright position and weakly control the left knee.                            Cognition Arousal/Alertness: Awake/alert Behavior During Therapy: Flat affect;WFL for tasks assessed/performed Overall Cognitive Status: Impaired/Different from baseline                     Current Attention Level: Sustained Memory: Decreased short-term memory Following Commands: Follows one step commands with increased time Safety/Judgement: Decreased awareness of safety;Decreased awareness of deficits Awareness: Intellectual Problem Solving: Decreased initiation;Difficulty sequencing;Requires verbal cues;Requires tactile cues        Exercises      General Comments General comments (skin integrity, edema, etc.): Vitals generally stable  EHR 100'2 and low 110's, EHR 80's and 90's a dn RR 20's and 30's.      Pertinent Vitals/Pain Pain Assessment: Faces Faces Pain Scale: Hurts little more Pain Location: R toe Pain Descriptors / Indicators: Sore Pain Intervention(s): Monitored during session  Home Living                      Prior Function            PT Goals (current goals can now be found in the care plan section) Acute Rehab PT Goals Patient Stated Goal: Pt did not state goals, daughter would like him to get strong enough to go home.  PT Goal Formulation: Patient unable to participate in goal setting Time For Goal Achievement: 07/18/17 Potential to Achieve Goals:  Fair Progress towards PT goals: Progressing toward goals    Frequency    Min 3X/week      PT Plan Current plan remains appropriate    Co-evaluation              AM-PAC PT "6 Clicks" Daily Activity  Outcome Measure  Difficulty turning over in bed (including adjusting bedclothes, sheets and blankets)?: Unable Difficulty moving from lying on back to sitting on the side of the bed? : Unable Difficulty sitting down on and standing up from a chair with arms (e.g., wheelchair, bedside commode, etc,.)?: Unable Help needed moving to and from a bed to chair (including a wheelchair)?: A Lot Help needed walking in hospital room?: A Lot Help needed climbing 3-5 steps with a railing? : Total 6 Click Score: 8    End of Session   Activity Tolerance: Patient limited by fatigue Patient left: in chair;with call bell/phone within reach;with chair alarm set Nurse Communication: Mobility status PT Visit Diagnosis: Hemiplegia and hemiparesis;Muscle weakness (generalized) (M62.81);Other abnormalities of gait and mobility (R26.89);Unsteadiness on feet (R26.81) Hemiplegia - Right/Left: Left Hemiplegia - dominant/non-dominant: Non-dominant Hemiplegia - caused by: Cerebral infarction     Time: 1114-1150 PT Time Calculation (min) (ACUTE ONLY): 36 min  Charges:  $Therapeutic Activity: 23-37 mins                    G Codes:       07/12/2017  [name], PT [phone]  (pager)   [name] 07/12/2017, 12:11 PM

## 2017-07-13 LAB — PROTIME-INR
INR: 2.82
Prothrombin Time: 29.4 seconds — ABNORMAL HIGH (ref 11.4–15.2)

## 2017-07-13 LAB — GLUCOSE, CAPILLARY
GLUCOSE-CAPILLARY: 217 mg/dL — AB (ref 65–99)
GLUCOSE-CAPILLARY: 160 mg/dL — AB (ref 65–99)
GLUCOSE-CAPILLARY: 172 mg/dL — AB (ref 65–99)
Glucose-Capillary: 169 mg/dL — ABNORMAL HIGH (ref 65–99)

## 2017-07-13 LAB — CBC
HEMATOCRIT: 33.8 % — AB (ref 39.0–52.0)
HEMOGLOBIN: 12.2 g/dL — AB (ref 13.0–17.0)
MCH: 24.9 pg — ABNORMAL LOW (ref 26.0–34.0)
MCHC: 36.1 g/dL — AB (ref 30.0–36.0)
MCV: 69 fL — AB (ref 78.0–100.0)
Platelets: 208 10*3/uL (ref 150–400)
RBC: 4.9 MIL/uL (ref 4.22–5.81)
RDW: 15.5 % (ref 11.5–15.5)
WBC: 8.8 10*3/uL (ref 4.0–10.5)

## 2017-07-13 MED ORDER — WARFARIN SODIUM 7.5 MG PO TABS
7.5000 mg | ORAL_TABLET | Freq: Once | ORAL | Status: AC
  Administered 2017-07-13: 7.5 mg via ORAL
  Filled 2017-07-13: qty 1

## 2017-07-13 NOTE — Progress Notes (Signed)
[facility] Progress Note  Patient: [name] WUJ:[id number]   PCP: Jaclyn Shaggy, MD DOB: 17-Jun-1946   DOA: 06/30/2017   DOS: 07/13/2017   Date of Service: the patient was seen and examined on 07/13/2017  Subjective: No fever, no chest pain, alert, awake and oriented x2.  Patient is a slightly more tachypneic today and requiring oxygen supplementation.  Brief hospital course: Pt. with PMH of CAD, chronic systolic CHF, right MCA stroke, type II DM, HTN; admitted on 06/30/2017, presented with complaint of confusion, was found to have UTI and pneumonia.  So has acute on chronic CHF started on IV milrinone. Currently further plan is continue close monitoring in the stepdown unit for now.  Assessment and Plan: 1.  Acute metabolic encephalopathy. -Likely from UTI as well as pneumonia. Also decrease perfusion in setting of worsening heart failure. -Symptomatology has been ongoing for last 3-4 weeks per patient's daughter. -CT scan unremarkable, ABG shows no hypercarbia. -Troponin negative EKG negative. -No new focal deficit on examination right now other than confusion. -Already treated with antibiotics with IV Unasyn, and oral Augmentin. No fever and non toxic in appearance. -EEG completed,shows metabolic encephalopathy, no active seizure focus. -MRI rules out any acute intracranial abnormality. -Ammonia level mildly elevated, recheck stable. -PRN Haldol, Seroquel.  Discontinuing all other psychotropic medications. -continue holding gabapentin -continue constant re-orientation and supportive care. -overall better according to daughter; but continue to be intermittently confuse, tachypneic and requiring oxygen supplementation to maintain O2 sat above 90%..    2.  UTI and community-acquired pneumonia. -Urine with enterococcus sensitive to all antibiotics. -There was a question of patient having pneumonia as well (concerns for aspiration). -Patient completed antibiotic treatment using  unasyn and then Augmentin. -Blood cultures negative fo any bacterial growth. -patient has remained afebrile  3.  Acute on chronic systolic CHF/Acute hypoxic respiratory failure. -Echocardiogram shows 15-20% EF with diffuse hypokinesis. -Patient was diuresed with IV Lasix. W/o significant improvement and required to be started on milrinone.  At that moment he had good response and diuresis while maintaining blood pressure. -Pt is already on therapeutic anti-coagulation with warfarin (INR therapeutic). -continue oxygen supplementation and continue weaning off as tolerated. -continue digoxin and spironolactone as per cardiology rec's -will continue watching off milrinone   4.  Multiple PVCs, -NSVT. -Monitor electrolytes and replete as needed. -once BP and CHF more stable, probably will benefit of resuming B-blcoker; will allow cardiology service to assist with further rec's. Currently BP is too soft.  5.  Coronary artery disease. -Two-vessel CABG in 2007. -Continue telemetry monitoring -Patient has remained chest pain-free -Continue aspirin and statins; no beta-blocker secondary to low blood pressure.  6.  Type II DM. -Continue sliding scale insulin -Follow CVG and further adjust hypoglycemic regimen as needed -Patient barely eating 50% of his meals.  7.  Recent right MCA stroke in 2018 -S/P mechanical thrombectomy on last admission. -Will continue aspirin Coumadin for secondary prevention  -Continue statin  8.  Chest pain. -Most likely demand ischemia in the presence of CHF exacerbation -Currently chest pain-free  9.  Goals of care discussion. -Patient with decompensated CHF, progressively worsening mentation without any reversible cause, recent acute CVA. -Patient also with poor p.o. intake and dysphagia 2. -daughter realizing bad shape of her dad's health; palliative care on board and helping with GOC. -Overall poor prognosis, And at this moment I will anticipate discharge to  a skilled nursing facility with palliative care follow him.   DVT Prophylaxis: on Warfarin  Advance goals  of care discussion: Still full code  Family Communication: Daughter at bedside  Disposition:  Per cardiology recommendations; best option is hospice care to guaranteed comfort. Patient off  milrinone drip; continue to be intermittently slightly more tachypneic and is now requiring oxygen supplementation again. Continue digoxin and continue spironolactone. Will continue to follow clinical response over the next 24-48 hours and further decide discharge options.  Daughter has been advised to discuss with the rest of the family to assist with final decisions for discharge purposes.  Consultants: cardiology  Procedures: EEG, Echocardiogram   Antibiotics: Anti-infectives (From admission, onward)   Start     Dose/Rate Route Frequency Ordered Stop   07/04/17 1000  amoxicillin-clavulanate (AUGMENTIN) 500-125 MG per tablet 500 mg     1 tablet Oral 3 times daily 07/04/17 0917 07/09/17 2101   07/03/17 1200  Ampicillin-Sulbactam (UNASYN) 3 g in sodium chloride 0.9 % 100 mL IVPB  Status:  Discontinued     3 g 200 mL/hr over 30 Minutes Intravenous Every 8 hours 07/03/17 1159 07/04/17 0917   07/02/17 2000  azithromycin (ZITHROMAX) tablet 250 mg  Status:  Discontinued     250 mg Oral Daily 07/01/17 1050 07/04/17 0911   07/02/17 1830  ciprofloxacin (CIPRO) IVPB 400 mg  Status:  Discontinued     400 mg 200 mL/hr over 60 Minutes Intravenous Every 12 hours 07/02/17 1742 07/03/17 1159   07/01/17 2200  cefTRIAXone (ROCEPHIN) 1 g in dextrose 5 % 50 mL IVPB  Status:  Discontinued     1 g 100 mL/hr over 30 Minutes Intravenous Every 24 hours 07/01/17 1057 07/03/17 1158   07/01/17 2000  azithromycin (ZITHROMAX) tablet 500 mg     500 mg Oral Daily 07/01/17 1050 07/01/17 2000   07/01/17 0100  cefTRIAXone (ROCEPHIN) 1 g in dextrose 5 % 50 mL IVPB  Status:  Discontinued     1 g 100 mL/hr over 30 Minutes  Intravenous Daily at bedtime 07/01/17 0050 07/01/17 0334   07/01/17 0100  azithromycin (ZITHROMAX) tablet 500 mg  Status:  Discontinued     500 mg Oral Daily at bedtime 07/01/17 0050 07/01/17 0334      Objective: Physical Exam: Vitals:   07/13/17 0830 07/13/17 1007 07/13/17 1149 07/13/17 1640  BP: (!) 90/53  94/66 (!) 95/51  Pulse: 95 (!) 101    Resp: (!) 24     Temp: 97.9 F (36.6 C)  97.8 F (36.6 C) 97.6 F (36.4 C)  TempSrc: Oral  Axillary Axillary  SpO2: 92%     Weight:      Height:        Intake/Output Summary (Last 24 hours) at 07/13/2017 1730 Last data filed at 07/13/2017 0500 Gross per 24 hour  Intake -  Output 250 ml  Net -250 ml   Filed Weights   07/11/17 0507 07/12/17 0500 07/13/17 0700  Weight: 72.7 kg (160 lb 3.2 oz) 67.4 kg (148 lb 11.2 oz) 72.2 kg (159 lb 3.2 oz)   General: No fever, patient tachypneic and is slightly short of breath during evaluation.  Has required to be back on oxygen supplementation 2 L.  He was oriented x2 and able to answer questions appropriately using an interpreter.  Appetite remains poor.  Denies chest pain, abdominal pain, nausea and vomiting. Cardiovascular: RRR, no rubs, no gallops, no murmurs.   Respiratory: No wheezing, no rhonchi, decreased breath sounds at the bases bilaterally, positive tachypnea.   Abdomen: Soft, nontender, nondistended, positive bowel sounds.  No  guarding.   Extremities: Trace edema bilaterally, no cyanosis, no clubbing    Neurologic exam: Patient remains weak on his right side (leg more than arm); slightly confused on my evaluation but according to daughter oriented x3 intermittently.   Data Reviewed: CBC: Recent Labs  Lab 07/07/17 0444  07/09/17 0409 07/10/17 0330 07/11/17 0346 07/12/17 0452 07/13/17 0354  WBC 7.9   < > 9.1 8.7 9.2 8.4 8.8  NEUTROABS 4.8  --   --   --   --   --   --   HGB 11.6*   < > 12.0* 12.3* 12.6* 12.5* 12.2*  HCT 32.4*   < > 33.6* 34.8* 35.3* 34.3* 33.8*  MCV 68.9*   < >  70.4* 69.9* 70.5* 69.4* 69.0*  PLT 232   < > 239 233 238 231 208   < > = values in this interval not displayed.   Basic Metabolic Panel: Recent Labs  Lab 07/08/17 0447 07/09/17 0409 07/10/17 0330 07/11/17 0346 07/12/17 0452  NA 139 139 139 137 136  K 3.5 3.7 3.9 4.0 4.1  CL 104 104 104 105 103  CO2 24 25 25  21* 23  GLUCOSE 171* 156* 156* 162* 154*  BUN 26* 25* 23* 22* 24*  CREATININE 1.03 1.12 1.07 1.00 1.02  CALCIUM 9.0 9.2 9.2 9.2 9.0  MG 2.0  --   --   --   --     Liver Function Tests: Recent Labs  Lab 07/07/17 0444  AST 29  ALT 54  ALKPHOS 176*  BILITOT 1.6*  PROT 6.1*  ALBUMIN 3.4*    Recent Labs  Lab 07/09/17 0409 07/10/17 0330  AMMONIA 22 16   Coagulation Profile: Recent Labs  Lab 07/09/17 0409 07/10/17 0330 07/11/17 0346 07/12/17 0452 07/13/17 0354  INR 2.70 2.68 2.63 2.78 2.82   CBG: Recent Labs  Lab 07/12/17 1650 07/12/17 2115 07/13/17 0750 07/13/17 1126 07/13/17 1638  GLUCAP 178* 147* 169* 217* 160*   Studies: No results found.  Scheduled Meds: . aspirin EC  81 mg Oral Daily  . atorvastatin  80 mg Oral q1800  . digoxin  0.125 mg Oral Daily  . feeding supplement (ENSURE ENLIVE)  237 mL Oral BID BM  . insulin aspart  0-15 Units Subcutaneous TID WC  . insulin aspart  0-5 Units Subcutaneous QHS  . insulin glargine  5 Units Subcutaneous QHS  . multivitamin with minerals  1 tablet Oral Daily  . pantoprazole  40 mg Oral Daily  . QUEtiapine  25 mg Oral BID  . spironolactone  12.5 mg Oral Daily  . warfarin  7.5 mg Oral ONCE-1800  . Warfarin - Pharmacist Dosing Inpatient   Does not apply q1800   Continuous Infusions:  PRN Meds: acetaminophen, haloperidol lactate, sodium chloride  Time spent: 25 min  Author: Vassie Lollarlos Abree Romick, MD [facility] Pager: [phone] 07/13/2017 5:30 PM  If 7PM-7AM, please contact night-coverage at [email or web address], password [email or web address]

## 2017-07-13 NOTE — Progress Notes (Signed)
ANTICOAGULATION CONSULT NOTE - Follow Up Consult  Pharmacy Consult for: Coumadin Indication: h/o CVA  No Known Allergies  Patient Measurements: Height: 5\' 10"  (177.8 cm) Weight: 159 lb 3.2 oz (72.2 kg) IBW/kg (Calculated) : 73  Vital Signs: Temp: 97.8 F (36.6 C) (11/25 1149) Temp Source: Axillary (11/25 1149) BP: 94/66 (11/25 1149) Pulse Rate: 101 (11/25 1007)  Labs: Recent Labs    07/11/17 0346 07/12/17 0452 07/13/17 0354  HGB 12.6* 12.5* 12.2*  HCT 35.3* 34.3* 33.8*  PLT 238 231 208  LABPROT 27.9* 29.2* 29.4*  INR 2.63 2.78 2.82  CREATININE 1.00 1.02  --     Estimated Creatinine Clearance: 68.8 mL/min (by C-G formula based on SCr of 1.02 mg/dL).  Assessment: 71yom continues on coumadin for h/o CVA. INR therapeutic at 2.82. CBC stable. No bleeding.  PTA dose: 11.25 mg on MWF and 7.5 mg AOD's per clinic, however the medication reconciliation states 7.5 mg daily  Goal of Therapy:  INR 2-3 Monitor platelets by anticoagulation protocol: Yes   Plan:  1) Warfarin 7.5mg  tonight 2) Daily INR and CBC  Adline PotterSabrina Tijana Walder, PharmD Pharmacy Resident Pager: [phone]

## 2017-07-14 DIAGNOSIS — J989 Respiratory disorder, unspecified: Secondary | ICD-10-CM

## 2017-07-14 DIAGNOSIS — R404 Transient alteration of awareness: Secondary | ICD-10-CM

## 2017-07-14 DIAGNOSIS — I259 Chronic ischemic heart disease, unspecified: Secondary | ICD-10-CM

## 2017-07-14 DIAGNOSIS — R0600 Dyspnea, unspecified: Secondary | ICD-10-CM

## 2017-07-14 DIAGNOSIS — R0602 Shortness of breath: Secondary | ICD-10-CM

## 2017-07-14 LAB — CBC
HCT: 34.7 % — ABNORMAL LOW (ref 39.0–52.0)
HEMOGLOBIN: 12.4 g/dL — AB (ref 13.0–17.0)
MCH: 25.1 pg — ABNORMAL LOW (ref 26.0–34.0)
MCHC: 35.7 g/dL (ref 30.0–36.0)
MCV: 70.2 fL — ABNORMAL LOW (ref 78.0–100.0)
Platelets: 228 10*3/uL (ref 150–400)
RBC: 4.94 MIL/uL (ref 4.22–5.81)
RDW: 15.8 % — ABNORMAL HIGH (ref 11.5–15.5)
WBC: 9.3 10*3/uL (ref 4.0–10.5)

## 2017-07-14 LAB — BASIC METABOLIC PANEL
ANION GAP: 9 (ref 5–15)
BUN: 23 mg/dL — AB (ref 6–20)
CO2: 22 mmol/L (ref 22–32)
Calcium: 9 mg/dL (ref 8.9–10.3)
Chloride: 103 mmol/L (ref 101–111)
Creatinine, Ser: 0.99 mg/dL (ref 0.61–1.24)
GFR calc Af Amer: >60 mL/min (ref >60)
Glucose, Bld: 190 mg/dL — ABNORMAL HIGH (ref 65–99)
POTASSIUM: 4 mmol/L (ref 3.5–5.1)
SODIUM: 134 mmol/L — AB (ref 135–145)

## 2017-07-14 LAB — GLUCOSE, CAPILLARY
GLUCOSE-CAPILLARY: 200 mg/dL — AB (ref 65–99)
GLUCOSE-CAPILLARY: 208 mg/dL — AB (ref 65–99)
Glucose-Capillary: 203 mg/dL — ABNORMAL HIGH (ref 65–99)
Glucose-Capillary: 174 mg/dL — ABNORMAL HIGH (ref 65–99)

## 2017-07-14 LAB — PROTIME-INR
INR: 2.95
PROTHROMBIN TIME: 30.5 s — AB (ref 11.4–15.2)

## 2017-07-14 MED ORDER — FUROSEMIDE 40 MG PO TABS
40.0000 mg | ORAL_TABLET | Freq: Every day | ORAL | Status: DC
  Administered 2017-07-15: 40 mg via ORAL
  Filled 2017-07-14: qty 1

## 2017-07-14 MED ORDER — FUROSEMIDE 10 MG/ML IJ SOLN
40.0000 mg | Freq: Once | INTRAMUSCULAR | Status: AC
  Administered 2017-07-14: 40 mg via INTRAVENOUS
  Filled 2017-07-14: qty 4

## 2017-07-14 MED ORDER — WARFARIN SODIUM 5 MG PO TABS
5.0000 mg | ORAL_TABLET | Freq: Once | ORAL | Status: AC
  Administered 2017-07-14: 5 mg via ORAL
  Filled 2017-07-14: qty 1

## 2017-07-14 NOTE — Progress Notes (Addendum)
[facility] Progress Note  Patient: [name] RUE:[id number]   PCP: Jaclyn ShaggyAmao, Enobong, MD DOB: [birth year]   DOA: 06/30/2017   DOS: 07/14/2017   Date of Service: the patient was seen and examined on 07/14/2017  Brief hospital course: Pt. Is a 71 yo M with PMH of CAD, chronic systolic CHF, right MCA stroke, type II DM, HTN; admitted on 06/30/2017, presented with complaint of confusion, was found to have UTI, community acquired pneumonia, acute on chronic CHF started on IV milrinone.  No acute events overnight. This morning patient is confused and attempts to get out of bed. Does not appears to be in acute distress.   Assessment and Plan: 1.  Acute metabolic encephalopathy. -unclear etiology, possibly 2/2 enteroccocus feacalis UTI, present on admission -Symptomatology has been ongoing for last 3-4 weeks per patient's daughter. -CT scan unremarkable, ABG shows no hypercarbia. -Troponin negative EKG negative. -No new focal deficit on examination right now other than confusion. -Already treated with antibiotics with IV Unasyn, and oral Augmentin. No fever and non toxic in appearance. -EEG completed,shows metabolic encephalopathy, no active seizure focus. -MRI rules out any acute intracranial abnormality. -Ammonia level mildly elevated, recheck stable. -PRN Haldol, Seroquel.  Discontinuing all other psychotropic medications. -continue holding gabapentin -continue constant re-orientation and supportive care. -overall better according to daughter; but continue to be intermittently confused  2.  Enteroccocus fecaelis UTI and community-acquired pneumonia, both present on admission. -Urine with enterococcus sensitive to all antibiotics. -There was a question of patient having pneumonia as well (concerns for aspiration). -Patient completed antibiotic treatment using unasyn and then Augmentin. -Blood cultures negative fo any bacterial growth. -patient has remained afebrile  3.  Acute on  chronic systolic CHF/Acute hypoxic respiratory failure. -Echocardiogram shows 15-20% EF with diffuse hypokinesis. -Patient was diuresed with IV Lasix. W/o significant improvement and required to be started on milrinone.  At that moment he had good response and diuresis while maintaining blood pressure. -Pt is already on therapeutic anti-coagulation with warfarin (INR therapeutic). -continue oxygen supplementation and continue weaning off as tolerated. -continue digoxin and spironolactone as per cardiology rec's -will continue watching off milrinone   4.  Multiple PVCs -NSVT. -Monitor electrolytes and replete as needed. -once BP and CHF more stable, probably will benefit of resuming B-blcoker; will allow cardiology service to assist with further rec's. Currently BP is too soft.  5.  Coronary artery disease. -Two-vessel CABG in 2007. -Continue telemetry monitoring -Patient has remained chest pain-free -Continue aspirin and statins; no beta-blocker secondary to low blood pressure.  6.  Type II DM. -Continue sliding scale insulin -Follow CVG and further adjust hypoglycemic regimen as needed -Patient barely eating 50% of his meals.  7.  Recent right MCA stroke in 2018 -S/P mechanical thrombectomy on last admission. -Will continue aspirin Coumadin for secondary prevention  -Continue statin  8.  Chest pain. -Most likely demand ischemia in the presence of CHF exacerbation -Currently chest pain-free  9.  Goals of care discussion. -Patient with decompensated CHF, progressively worsening mentation without any reversible cause, recent acute CVA. -Patient also with poor p.o. intake and dysphagia 2. -daughter realizing bad shape of her dad's health; palliative care on board and helping with GOC. -Overall poor prognosis, And at this moment I will anticipate discharge to a skilled nursing facility with palliative care follow him.  10. 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 Patient off  milrinone drip; continue to be intermittently slightly more tachypneic and is now requiring oxygen supplementation again. Continue digoxin and continue spironolactone. Will continue to follow clinical response over the next 24-48 hours and further decide discharge options.  Daughter has been advised to discuss with the rest of the family to assist with final decisions for discharge purposes.  Consultants: cardiology  Procedures: EEG, Echocardiogram   Antibiotics: Anti-infectives (From admission, onward)   Start     Dose/Rate Route Frequency Ordered Stop   07/04/17 1000  amoxicillin-clavulanate (AUGMENTIN) 500-125 MG per tablet 500 mg     1 tablet Oral 3 times daily 07/04/17 0917 07/09/17 2101   07/03/17 1200  Ampicillin-Sulbactam (UNASYN) 3 g in sodium chloride 0.9 % 100 mL IVPB  Status:  Discontinued     3 g 200 mL/hr over 30 Minutes Intravenous Every 8 hours 07/03/17 1159 07/04/17 0917   07/02/17 2000  azithromycin (ZITHROMAX) tablet 250 mg  Status:  Discontinued     250 mg Oral Daily 07/01/17 1050 07/04/17 0911   07/02/17 1830  ciprofloxacin (CIPRO) IVPB 400 mg  Status:  Discontinued     400 mg 200 mL/hr over 60 Minutes Intravenous Every 12 hours 07/02/17 1742 07/03/17 1159   07/01/17 2200  cefTRIAXone (ROCEPHIN) 1 g in dextrose 5 % 50 mL IVPB  Status:  Discontinued     1 g 100 mL/hr over 30 Minutes Intravenous Every 24 hours 07/01/17 1057 07/03/17 1158   07/01/17 2000  azithromycin (ZITHROMAX) tablet 500 mg     500 mg Oral Daily 07/01/17 1050 07/01/17 2000   07/01/17 0100  cefTRIAXone (ROCEPHIN) 1 g in dextrose 5 % 50 mL IVPB  Status:   Discontinued     1 g 100 mL/hr over 30 Minutes Intravenous Daily at bedtime 07/01/17 0050 07/01/17 0334   07/01/17 0100  azithromycin (ZITHROMAX) tablet 500 mg  Status:  Discontinued     500 mg Oral Daily at bedtime 07/01/17 0050 07/01/17 0334      Objective: Physical Exam: Vitals:   07/13/17 1640 07/13/17 1948 07/14/17 0051 07/14/17 0551  BP: (!) 95/51 (!) 154/67 113/77 112/82  Pulse:  (!) 117 (!) 104 (!) 113  Resp:  18 (!) 29 15  Temp: 97.6 F (36.4 C) 97.9 F (36.6 C) 97.7 F (36.5 C) 97.9 F (36.6 C)  TempSrc: Axillary Oral Oral Oral  SpO2:   100% 99%  Weight:    72.4 kg (159 lb 11.2 oz)  Height:       No intake or output data in the 24 hours ending 07/14/17 0744 Filed Weights   07/12/17 0500 07/13/17 0700 07/14/17 0551  Weight: 67.4 kg (148 lb 11.2 oz) 72.2 kg (159 lb 3.2 oz) 72.4 kg (159 lb 11.2 oz)   General: 71 yo M thin built in NAD. Saturating 100% RA Cardiovascular: RRR, no rubs, no gallops, no murmurs.   Respiratory: No wheezing, no rhonchi, decreased breath sounds at the bases bilaterally. Abdomen: Soft, nontender, nondistended, positive bowel sounds.  No guarding.   Extremities: Trace edema bilaterally, no cyanosis, no clubbing    Neurologic exam: Patient remains weak on his right side (leg more than arm); slightly confused on my evaluation.  Data Reviewed: CBC: Recent Labs  Lab 07/10/17 0330 07/11/17 0346 07/12/17 0452 07/13/17 0354 07/14/17 0329  WBC  8.7 9.2 8.4 8.8 9.3  HGB 12.3* 12.6* 12.5* 12.2* 12.4*  HCT 34.8* 35.3* 34.3* 33.8* 34.7*  MCV 69.9* 70.5* 69.4* 69.0* 70.2*  PLT 233 238 231 208 228   Basic Metabolic Panel: Recent Labs  Lab 07/08/17 0447 07/09/17 0409 07/10/17 0330 07/11/17 0346 07/12/17 0452  NA 139 139 139 137 136  K 3.5 3.7 3.9 4.0 4.1  CL 104 104 104 105 103  CO2 24 25 25  21* 23  GLUCOSE 171* 156* 156* 162* 154*  BUN 26* 25* 23* 22* 24*  CREATININE 1.03 1.12 1.07 1.00 1.02  CALCIUM 9.0 9.2 9.2 9.2 9.0  MG 2.0  --    --   --   --     Liver Function Tests: No results for input(s): AST, ALT, ALKPHOS, BILITOT, PROT, ALBUMIN in the last 168 hours.  Recent Labs  Lab 07/09/17 0409 07/10/17 0330  AMMONIA 22 16   Coagulation Profile: Recent Labs  Lab 07/10/17 0330 07/11/17 0346 07/12/17 0452 07/13/17 0354 07/14/17 0329  INR 2.68 2.63 2.78 2.82 2.95   CBG: Recent Labs  Lab 07/13/17 0750 07/13/17 1126 07/13/17 1638 07/13/17 2231 07/14/17 0742  GLUCAP 169* 217* 160* 172* 200*   Studies: No results found.  Scheduled Meds: . aspirin EC  81 mg Oral Daily  . atorvastatin  80 mg Oral q1800  . digoxin  0.125 mg Oral Daily  . feeding supplement (ENSURE ENLIVE)  237 mL Oral BID BM  . insulin aspart  0-15 Units Subcutaneous TID WC  . insulin aspart  0-5 Units Subcutaneous QHS  . insulin glargine  5 Units Subcutaneous QHS  . multivitamin with minerals  1 tablet Oral Daily  . pantoprazole  40 mg Oral Daily  . QUEtiapine  25 mg Oral BID  . spironolactone  12.5 mg Oral Daily  . Warfarin - Pharmacist Dosing Inpatient   Does not apply q1800   Continuous Infusions:  PRN Meds: acetaminophen, haloperidol lactate, sodium chloride

## 2017-07-14 NOTE — Progress Notes (Signed)
ANTICOAGULATION CONSULT NOTE - Follow Up Consult  Pharmacy Consult for: Coumadin Indication: h/o CVA  No Known Allergies  Patient Measurements: Height: 5\' 10"  (177.8 cm) Weight: 159 lb 11.2 oz (72.4 kg) IBW/kg (Calculated) : 73  Vital Signs: Temp: 98 F (36.7 C) (11/26 1134) Temp Source: Oral (11/26 1134) BP: 118/78 (11/26 1134) Pulse Rate: 100 (11/26 1134)  Labs: Recent Labs    07/12/17 0452 07/13/17 0354 07/14/17 0329 07/14/17 0928  HGB 12.5* 12.2* 12.4*  --   HCT 34.3* 33.8* 34.7*  --   PLT 231 208 228  --   LABPROT 29.2* 29.4* 30.5*  --   INR 2.78 2.82 2.95  --   CREATININE 1.02  --   --  0.99    Estimated Creatinine Clearance: 71.1 mL/min (by C-G formula based on SCr of 0.99 mg/dL).  Assessment: 71yom continues on coumadin for h/o CVA. INR changed from 2.82 to 2.95 today. CBC stable. No bleeding noted. No new medication interactions  PTA dose: 11.25 mg on MWF and 7.5 mg AOD's per clinic, however the medication reconciliation states 7.5 mg daily.  Goal of Therapy:  INR 2-3 Monitor platelets by anticoagulation protocol: Yes   Plan:  1) Warfarin 5 mg tonight 2) Daily INR and CBC 3) Monitor signs/symptoms of bleeding 4) Follow along with goals of care conversation with palliative care  Girard CooterKimberly Perkins, PharmD Clinical Pharmacist  Pager: [phone] Clinical Phone for 07/14/2017 until 3:30pm: [phone] If after 3:30pm, please call main pharmacy at [phone]

## 2017-07-14 NOTE — Progress Notes (Signed)
Physical Therapy Treatment Patient Details Name: [name] MRN: [id number] DOB: July 23, 1946 Today's Date: 07/14/2017    History of Present Illness Pt is a 71 y/o male who presents with AMS ~1 week. He was found to have CAP, UTI, encephalopathy, and acute on chronic CHF. 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    PT Comments    Pt participative with dtr translating non technical info.  Initially lethargic then fatigued.  Emphasis on transitions in bed, sit to stand and gait training.   Follow Up Recommendations  SNF;Supervision/Assistance - 24 hour     Equipment Recommendations  3in1 (PT)    Recommendations for Other Services       Precautions / Restrictions Precautions Precautions: Fall Precaution Comments: Residual L side weakness from CVA in September 2018.  Restrictions Weight Bearing Restrictions: No    Mobility  Bed Mobility Overal bed mobility: Needs Assistance Bed Mobility: Supine to Sit     Supine to sit: Mod assist;HOB elevated     General bed mobility comments: cued for direction,  truncal stability assist to assist to EOB  Transfers Overall transfer level: Needs assistance Equipment used: Rolling walker (2 wheeled) Transfers: Sit to/from Stand Sit to Stand: Mod assist;+2 safety/equipment;+2 physical assistance         General transfer comment: cues for technique, assist to help forward and boost up., upper trunk assist to attain full upright.  Ambulation/Gait Ambulation/Gait assistance: Mod assist;+2 physical assistance;+2 safety/equipment Ambulation Distance (Feet): 20 Feet(then additional 45 feet with RW) Assistive device: Rolling walker (2 wheeled) Gait Pattern/deviations: Step-through pattern   Gait velocity interpretation: Below normal speed for age/gender General Gait Details: needed truncal support given from left side to help maintain upright posture.  Unsteady steps, worse on the L with knee  instability, but no overt collapse of the knee.   Stairs            Wheelchair Mobility    Modified Rankin (Stroke Patients Only)       Balance Overall balance assessment: Needs assistance Sitting-balance support: Feet supported;No upper extremity supported;Single extremity supported Sitting balance-Leahy Scale: Fair Sitting balance - Comments: trunk moving inside his BOS without need for UE's   Standing balance support: Bilateral upper extremity supported Standing balance-Leahy Scale: Poor Standing balance comment: reliant on the AD's or external support                            Cognition Arousal/Alertness: Awake/alert;Lethargic Behavior During Therapy: Flat affect;WFL for tasks assessed/performed Overall Cognitive Status: Impaired/Different from baseline                   Orientation Level: Time;Situation Current Attention Level: Sustained Memory: Decreased short-term memory Following Commands: Follows one step commands with increased time Safety/Judgement: Decreased awareness of safety;Decreased awareness of deficits Awareness: Intellectual Problem Solving: Slow processing;Decreased initiation        Exercises      General Comments General comments (skin integrity, edema, etc.): SpO2 in the mid 90's, EHR upper 90's, RR in upper 20's low 30's      Pertinent Vitals/Pain Pain Assessment: Faces Faces Pain Scale: No hurt    Home Living                      Prior Function            PT Goals (current goals can now be found in the  care plan section) Acute Rehab PT Goals PT Goal Formulation: Patient unable to participate in goal setting Time For Goal Achievement: 07/18/17 Potential to Achieve Goals: Fair Progress towards PT goals: Progressing toward goals    Frequency    Min 3X/week      PT Plan Current plan remains appropriate    Co-evaluation              AM-PAC PT "6 Clicks" Daily Activity  Outcome  Measure  Difficulty turning over in bed (including adjusting bedclothes, sheets and blankets)?: Unable Difficulty moving from lying on back to sitting on the side of the bed? : Unable Difficulty sitting down on and standing up from a chair with arms (e.g., wheelchair, bedside commode, etc,.)?: Unable Help needed moving to and from a bed to chair (including a wheelchair)?: A Lot Help needed walking in hospital room?: A Lot Help needed climbing 3-5 steps with a railing? : Total 6 Click Score: 8    End of Session   Activity Tolerance: Patient limited by fatigue Patient left: in chair;with call bell/phone within reach;with chair alarm set;with family/visitor present Nurse Communication: Mobility status PT Visit Diagnosis: Hemiplegia and hemiparesis;Muscle weakness (generalized) (M62.81);Other abnormalities of gait and mobility (R26.89);Unsteadiness on feet (R26.81) Hemiplegia - Right/Left: Left Hemiplegia - dominant/non-dominant: Non-dominant Hemiplegia - caused by: Cerebral infarction     Time: 1221-1250 PT Time Calculation (min) (ACUTE ONLY): 29 min  Charges:  $Gait Training: 8-22 mins $Therapeutic Activity: 8-22 mins                    G Codes:       07/14/2017  [place] BingKen Reeta Kuk, PT [phone]  (pager)   Eliseo GumKenneth V Merica Prell 07/14/2017, 3:01 PM

## 2017-07-14 NOTE — Progress Notes (Signed)
CSW following for disposition planning. CSW unable to place patient in SNF due to lack of insurance coverage. CSW unable to obtain LOG bed as patient only has emergency Medicaid, which will not cover SNF stay. Palliative also following for GOC.  CSW met with daughter at bedside and discussed disposition planning. Daughter was hopeful for SNF, as she provides all care for patient at home and will have difficulty lifting patient alone. Daughter interested in private duty aide at home if patient discharging home.   CSW to follow pending continued PT/OT and palliative recommendations.  Estanislado Emms, [place]

## 2017-07-14 NOTE — Progress Notes (Signed)
Nutrition Follow-up  DOCUMENTATION CODES:   Non-severe (moderate) malnutrition in context of chronic illness  INTERVENTION:    Continue Ensure Enlive po BID, each supplement provides 350 kcal and 20 grams of protein  NUTRITION DIAGNOSIS:   Moderate Malnutrition related to chronic illness as evidenced by moderate fat depletion, moderate muscle depletion, percent weight loss.  Ongoing  GOAL:   Patient will meet greater than or equal to 90% of their needs  Unmet  MONITOR:   PO intake, Supplement acceptance, I & O's, Labs, Weight trends  ASSESSMENT:   71 year old male with medical history significant for CAD, systolic CHF, right MCA stroke in September following embolectomy, diabetes, hypertension presents to the ED due to acute metabolic encephalopathy of unknown etiology for the past 1 week  Spoke with patient's daughter, who reports patient ate half of his beef and green beans at lunch today, but vomited when she gave him a bite of potatoes. He is drinking Glucerna Shakes and Ensure shakes without difficulty. Palliative Care Team is following. Daughter is trying to get family from [name] to come here to see patient before they make him a DNR.  Weight continues to decline.  Diet Order:  DIET DYS 2 Room service appropriate? Yes; Fluid consistency: Thin  EDUCATION NEEDS:   Education needs have been addressed  Skin:  Skin Assessment: Reviewed RN Assessment  Last BM:  11/21  Height:   Ht Readings from Last 1 Encounters:  07/01/17 5\' 10"  (1.778 m)    Weight:   Wt Readings from Last 1 Encounters:  07/14/17 159 lb 11.2 oz (72.4 kg)    Ideal Body Weight:  75.45 kg  BMI:  Body mass index is 22.91 kg/m.  Estimated Nutritional Needs:   Kcal:  1700-2000 calories  Protein:  96-110 grams  Fluid:  1.7-2L   Joaquin CourtsKimberly Harris, RD, LDN, CNSC Pager [phone] After Hours Pager [phone]

## 2017-07-14 NOTE — Progress Notes (Signed)
Daily Progress Note   Patient Name: [name]       Date: 07/14/2017 DOB: 20-Feb-1946  Age: 71 y.o. MRN#: [id number] Attending Physician: Kayleen Memos, DO Primary Care Physician: Arnoldo Morale, MD Admit Date: 06/30/2017  Reason for Consultation/Follow-up: Establishing goals of care and Psychosocial/spiritual support  Subjective: Patient sitting up in chair, appears agitated.   Length of Stay: 13  Current Medications: Scheduled Meds:  . aspirin EC  81 mg Oral Daily  . atorvastatin  80 mg Oral q1800  . digoxin  0.125 mg Oral Daily  . feeding supplement (ENSURE ENLIVE)  237 mL Oral BID BM  . [START ON 07/15/2017] furosemide  40 mg Oral Daily  . insulin aspart  0-15 Units Subcutaneous TID WC  . insulin aspart  0-5 Units Subcutaneous QHS  . insulin glargine  5 Units Subcutaneous QHS  . multivitamin with minerals  1 tablet Oral Daily  . pantoprazole  40 mg Oral Daily  . QUEtiapine  25 mg Oral BID  . spironolactone  12.5 mg Oral Daily  . Warfarin - Pharmacist Dosing Inpatient   Does not apply q1800    Continuous Infusions:   PRN Meds: acetaminophen, haloperidol lactate, sodium chloride  Physical Exam  Constitutional: He appears lethargic.  Cardiovascular: Normal rate.  Pulses:      Radial pulses are 1+ on the right side, and 1+ on the left side.       Dorsalis pedis pulses are 1+ on the right side, and 1+ on the left side.  Pulmonary/Chest: Effort normal and breath sounds normal. No accessory muscle usage. No tachypnea. No respiratory distress.  Abdominal: Soft. Normal appearance and bowel sounds are normal. There is no tenderness.  Musculoskeletal:       Right lower leg: He exhibits no edema.       Left lower leg: He exhibits no edema.  Neurological: He appears lethargic.  Skin:  Skin is warm and dry.  Psychiatric: He is agitated and withdrawn. Cognition and memory are impaired.            Vital Signs: BP 118/78   Pulse 100   Temp 98 F (36.7 C) (Oral)   Resp (!) 21   Ht _0  (1.778 m)   Wt 72.4 kg (159 lb 11.2 oz)   SpO2 98%   BMI 22.91 kg/m  SpO2: SpO2: 98 % O2 Device: O2 Device: Not Delivered O2 Flow Rate: O2 Flow Rate (L/min): 2 L/min  Intake/output summary:   Intake/Output Summary (Last 24 hours) at 07/14/2017 1445 Last data filed at 07/14/2017 1300 Gross per 24 hour  Intake 60 ml  Output -  Net 60 ml   LBM: Last BM Date: 07/09/17 Baseline Weight: Weight: 76.7 kg (169 lb) Most recent weight: Weight: 72.4 kg (159 lb 11.2 oz)       Palliative Assessment/Data:40%    Flowsheet Rows     Most Recent Value  Intake Tab  Referral Department  Hospitalist  Unit at Time of Referral  Intermediate Care Unit  Palliative Care Primary Diagnosis  Cardiac  Date Notified  07/08/17  Palliative Care Type  New Palliative care  Reason for referral  Clarify Goals of Care, Counsel  Regarding Hospice  Date of Admission  06/30/17  Date first seen by Palliative Care  07/09/17  # of days Palliative referral response time  1 Day(s)  # of days IP prior to Palliative referral  8  Clinical Assessment  Palliative Performance Scale Score  50%  Psychosocial & Spiritual Assessment  Palliative Care Outcomes  Patient/Family meeting held?  Yes  Who was at the meeting?  patient and daughter  Palliative Care Outcomes  Provided psychosocial or spiritual support      Patient Active Problem List   Diagnosis Date Noted  . Encephalopathy   . Palliative care by specialist   . Goals of care, counseling/discussion   . CAP (community acquired pneumonia) 07/01/2017  . CAD (coronary artery disease) 05/27/2017  . Chronic combined systolic and diastolic heart failure ([place]) 05/27/2017  . Long term (current) use of anticoagulants [Z79.01] 05/16/2017  . Chronic anticoagulation    . Shortness of breath   . Diabetes mellitus type 2 in nonobese ([name])   . Subtherapeutic anticoagulation   . Neuropathic pain   . AKI (acute kidney injury) ([place])   . Right middle cerebral artery stroke ([place]) 05/05/2017  . Coronary artery disease involving coronary bypass graft of native heart without angina pectoris   . Type 2 diabetes mellitus with peripheral neuropathy ([name])   . Benign essential HTN   . Hyperlipidemia   . Acute blood loss anemia   . Anemia of chronic disease   . History of medication noncompliance   . Ischemia of right lower extremity   . Endotracheally intubated   . Ischemia of left lower extremity   . Respiratory failure ([place])   . Occlusion of left carotid artery   . Urinary retention   . Ischemic cardiomyopathy   . S/P CABG (coronary artery bypass graft)   . Cerebral embolism with cerebral infarction 04/25/2017  . Acute on chronic systolic heart failure ([place]) 04/24/2017  . Hyponatremia 04/24/2017  . CHF (congestive heart failure) ([place]) 04/17/2017    Palliative Care Assessment & Plan   HPI: 71 y.o.malewith past medical history of systolic CHF (EF 56-25% on 07/07/17), CAD (CABG 2007 in [place]), R MCA stroke in September 2018 s/p mechanical thrombectomy, T2DM, HTN, HLD, and a thoracic aneurysmadmitted on 11/12/2018with weight loss and confusion.Per PCP, patient had lost 11 pounds in 1 month and had a poor appetite. Was found to have UTI, possible pneumonia, and CHF exacerbation. BNP >2000 and signs of volume overload. UTI/?pna treated with IV Unasyn and oral Augmentin. Blood cultures negative.  Patient has continued to experience altered mental status throughout admission - per daughter confusion has been ongoing for last 3-4 weeks. CT scan unremarkable, MRI rules out acute intracranial abnormality, EEG shows encephalopathy but no seizure activity, ammonia normal, and ABG shows no hypercarbia. Receiving PRN haldol and Seroquel for agitation.   Was  diuresed with IV lasix. Pt was on milrinone trial 07/07/17 - 07/10/17. Patient has continued to complain of chest pain and shortness of breath throughout admission. He has also had issues with urinary retention this admission.   Of note, patient is from [place] and speaks [place]. His daughter speaks [place] well. Patient has been admitted 4 times in previous 6 months. Daughter has been providing 24/7 care to the patient at home. All other family lives in [place], including other children and patient's wife.  Hospice recommended by cardiology and PMT consulted for [place].  Assessment: Met w/patient at bedside. He appears agitated and confused. Called daughter and she  tells me patient ambulated on unit today and is eating 50% of meals. She is concerned about disposition plan. He has no insurance for SNF placement and she plans to take him home. I do not believe he is residential hospice appropriate. Offered hospice at home and described the support that is offered - she is agreeable to this and asks for them to provide hospital bed and bedside commode. She tells me she has been unable to work for 3 months d/t caring for her day and has four children at home so they are in a difficult financial situation.   She tells me her family is still working to obtain visas to come visit, but today is a holiday in [place] so the process is stalled.   Will follow up with daughter tomorrow - need to discuss code status again. However, during last discussion of code status she states she wants everything done until her family is here to see patient.   Recommendations/Plan:  Home with hospice when appropriate for discharge  Goals of Care and Additional Recommendations:  Limitations on Scope of Treatment: Full Scope Treatment  Code Status:  Full code  Prognosis:   < 6 months  Discharge Planning:  Home with Hospice  Care plan was discussed with patients daughter and social work  Thank you for allowing the  Palliative Medicine Team to assist in the care of this patient.   Total Time 25 minutes Prolonged Time Billed  no       Greater than 50%  of this time was spent counseling and coordinating care related to the above assessment and plan.  Juel Burrow, DNP, AGNP-C Palliative Medicine Team Team Phone # [phone]

## 2017-07-14 NOTE — Care Management Note (Addendum)
Case Management Note  Patient Details  Name: Gardiner Rhymessa Steagall MRN: [id number] Date of Birth: [birth year]  Subjective/Objective:   Pt presented for pneumonia.  [facility]  Pt has emergency Medicaid only.  Unable to place with SNF since no coverage and no LOG.   Daughter agreeable to take pt home with hospice. Daughter states they have walker but no other home equipment.    Action/Plan: Spoke with daughter over phone about choosing hospice agency.  Left GC list in room for daughter to review tonight.  Advised daughter we would speak with her tomorrow about her choice and what equipment is necessary.    Expected Discharge Date:                  Expected Discharge Plan:  Home w Home Health Services  In-House Referral:  Clinical Social Work  Discharge planning Services  CM Consult  Post Acute Care Choice:  Hospice Choice offered to:  Adult Children  DME Arranged:   Hospital bed, 3 in1 [facility]  [facility]    [facility]  Status of Service:  In process, will continue to follow  If discussed at Long Length of Stay Meetings, dates discussed:    Additional Comments: Pt's daughter reviewed list and chooses [facility].  Jenn with [facility] contacted for referral.  Liason will visit pt's room today.  Also informed Jenn of need for hospital bed and 3in1.  Family aware. Verdene LennertGoldean, Anh Mangano K, RN 07/14/2017, 4:02 PM

## 2017-07-14 NOTE — Progress Notes (Signed)
Advanced Heart Failure Rounding Note  PCP: Dr. Venetia NightAmao Primary Cardiologist: Dr. Jens Somrenshaw HF: New (Dr. Gala RomneyBensimhon)  Subjective:    Urine culture 07/01/17 pan-sensitive Enteroccus Faecalis. On ABX until 07/15/17.  Milrinone stopped 07/11/17.   Daughter not present currently.   Pt denies pain.  Says breathing is ok   CBC stable. No BMET since 07/12/17. BMET pending.   Objective:   Weight Range: 159 lb 11.2 oz (72.4 kg) Body mass index is 22.91 kg/m.   Vital Signs:   Temp:  [97.6 F (36.4 C)-97.9 F (36.6 C)] 97.9 F (36.6 C) (11/26 0551) Pulse Rate:  [95-117] 113 (11/26 0551) Resp:  [15-29] 15 (11/26 0551) BP: (90-154)/(51-82) 112/82 (11/26 0551) SpO2:  [92 %-100 %] 99 % (11/26 0551) Weight:  [159 lb 11.2 oz (72.4 kg)] 159 lb 11.2 oz (72.4 kg) (11/26 0551) Last BM Date: 07/09/17  Weight change: Filed Weights   07/12/17 0500 07/13/17 0700 07/14/17 0551  Weight: 148 lb 11.2 oz (67.4 kg) 159 lb 3.2 oz (72.2 kg) 159 lb 11.2 oz (72.4 kg)    Intake/Output:  No intake or output data in the 24 hours ending 07/14/17 0745    Physical Exam    General: Chronically ill and elderly appearing.  HEENT: Normal. On O2 via [place].  Neck: Supple. JVP 8-9 cm. Carotids 2+ bilat; no bruits. No thyromegaly or nodule noted. Cor: PMI lateral. Regular, tachy. +S3.  Lungs: Mildly diminished basilar sounds, Mildly tachypneic.  Abdomen: Soft, non-tender, non-distended, no HSM. No bruits or masses. +BS  Extremities: No cyanosis, clubbing, or rash. Wasting of RLE. Eschar on 3rd toe of R foot.  Neuro: Awake. Follows commands, but seems agitated. Weak RLE.   Telemetry    Sinus tach 100-110s, Personally reviewed.   EKG    N/A  Labs    CBC Recent Labs    07/13/17 0354 07/14/17 0329  WBC 8.8 9.3  HGB 12.2* 12.4*  HCT 33.8* 34.7*  MCV 69.0* 70.2*  PLT 208 228   Basic Metabolic Panel Recent Labs    40/98/1111/24/18 0452  NA 136  K 4.1  CL 103  CO2 23  GLUCOSE 154*  BUN 24*    CREATININE 1.02  CALCIUM 9.0   Liver Function Tests No results for input(s): AST, ALT, ALKPHOS, BILITOT, PROT, ALBUMIN in the last 72 hours. No results for input(s): LIPASE, AMYLASE in the last 72 hours. Cardiac Enzymes No results for input(s): CKTOTAL, CKMB, CKMBINDEX, TROPONINI in the last 72 hours.  BNP: BNP (last 3 results) Recent Labs    05/27/17 1131 06/30/17 2200 07/06/17 0045  BNP 1,040.4* 2,434.3* 3,837.7*    ProBNP (last 3 results) No results for input(s): PROBNP in the last 8760 hours.   D-Dimer No results for input(s): DDIMER in the last 72 hours. Hemoglobin A1C No results for input(s): HGBA1C in the last 72 hours. Fasting Lipid Panel No results for input(s): CHOL, HDL, LDLCALC, TRIG, CHOLHDL, LDLDIRECT in the last 72 hours. Thyroid Function Tests No results for input(s): TSH, T4TOTAL, T3FREE, THYROIDAB in the last 72 hours.  Invalid input(s): FREET3  Other results:   Imaging    No results found.  Medications:    Scheduled Medications: . aspirin EC  81 mg Oral Daily  . atorvastatin  80 mg Oral q1800  . digoxin  0.125 mg Oral Daily  . feeding supplement (ENSURE ENLIVE)  237 mL Oral BID BM  . insulin aspart  0-15 Units Subcutaneous TID WC  . insulin aspart  0-5 Units Subcutaneous QHS  . insulin glargine  5 Units Subcutaneous QHS  . multivitamin with minerals  1 tablet Oral Daily  . pantoprazole  40 mg Oral Daily  . QUEtiapine  25 mg Oral BID  . spironolactone  12.5 mg Oral Daily  . Warfarin - Pharmacist Dosing Inpatient   Does not apply q1800    Infusions:   PRN Medications: acetaminophen, haloperidol lactate, sodium chloride  Patient Profile   [name] is a 71 y/o M with history of CAD (s/p CABG in 2007 in [place]), chronic systolic CHF (EF 16-10% in 03/2017),, thoracic aortic aneurysm 03/2017 (4.2 vs 4.6cm by CTs Aug/Sept 2018), cerebrovascular disease, HTN, HLD, Type 2 DM, stroke (~2016, also 04/2017 with acute R MCA occlusion and R  popliteal embolic occlusion, placed on Coumadin), PVCs by prior telemetry who is being seen today for the evaluation of CHF at the request of Dr. Allena Katz.   He was admitted with weight loss, worsening confusion for several weeks, weakness, dyspnea. With diuresis has had intermittent hypotension.  Assessment/Plan   1. Acute encephalopathy - Suspect multifactorial previous CVA, UTI and HF - Intermittent. Daughter not present to attest to baseline.  - Makes eye contact, but then looks away and intermittently responds to commands.  2. Acute on chronic systolic CHF  - Echo 07/07/17 LVEF 15-20%, Trivial AI, Severe LAE, Mod RV dilation, Mod/Sev RAE, P peak pressure 40 mmHg. - Milrinone stopped 07/11/17. More tachypneic and requiring oxygen supplementation.  - Not candidate for home milrinone or advanced therapies due to CVA and comorbidities - Volume mildly elevated off lasix. Will give IV lasix 40 mg today and plan to resume po tomorrow if stable.  - Continue digoxin. Continue spiro. BMET pending.  - No bb or ACE/ARB/ARNI with low output and low BP - Can consider hydral/nitrates as BP tolerates - Maintain that Hospice is the best option.  3. Intermittent hypotension concerning for low output - Pressures remain soft in 90-100s. Meds as above.  4. CAD as above with minimal troponin bump (0.03) - No further CP.  - No indication for cath at this time.  5. H/o embolic stroke and lower extremity embolism in 04/2017 - This was very debilitating and he has made very little progress in recovery. No change.  - Continue warfarin, low dose ASA and statin. 6. UTI - Pan sensitive enterococcus.  - On augmentin. To finish course tomorrow.  7. GOC - Palliative care has seen.  - PT recommending SNF.  - Appreciate palliative care support. Hospice care is best option to ensure comfort.  - Daughter would like to try and get him home to [place]. She is aware that these travel arrangements would be very difficult,  and do not think there is a safe way to do this.  - Prognosis remains quite poor.   Length of Stay: [address]  [employer]  07/14/2017, 7:45 AM  Advanced Heart Failure Team Pager [phone] (M-F; 7a - 4p)  Please contact [facility] for night-coverage after hours (4p -7a ) and weekends on [email or web address]  Patient seen and examined with the above-signed Advanced Practice Provider and/or Housestaff. I personally reviewed laboratory data, imaging studies and relevant notes. I independently examined the patient and formulated the important aspects of the plan. I have edited the note to reflect any of my changes or salient points. I have personally discussed the plan with the patient and/or family.  He remains quite tenuous. Seems a bit worse off milrinone but  even with milrinone was marginal. Agree with prusing Hospice Care. Will give one dose IV lasix today and then start 40 daily po. Otherwise continue digoxin and spiro. BP has been too soft for [place].   We will sign off. Please call with questions.

## 2017-07-14 NOTE — Progress Notes (Signed)
  Speech Language Pathology Treatment: Dysphagia  Patient Details Name: [name] MRN: [id number] DOB: Jul 23, 1946 Today's Date: 07/14/2017 Time: [id number] SLP Time Calculation (min) (ACUTE ONLY): 9 min  Assessment / Plan / Recommendation Clinical Impression  Pt is very drowsy this morning, requiring Mod cues and stimulation from daughter to attempt PO intake. He consumed thin liquids by straw with oral holding and anterior leakage noted, requiring Mod cues to swallow. When he did swallow, he had no overt s/s of aspiration, although his RR at baseline was elevated into the 30s upon waking. SLP reviewed safe swallowing precautions with his daughter, who says that when he is more alert he has been able to consume more food, and he does well with the liquids. Will continue with current diet for now.   HPI HPI: Pt is a 71 y/o male who presents with AMS ~1 week. He was found to have CAP, UTI, encephalopathy, and acute on chronic CHF. 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 CXR indistinct obscuration of both hemidiaphragms potentially from her space opacities or layering pleural effusions. Relative clearing of the upper lobes compared to 07/05/17.      SLP Plan  Continue with current plan of care       Recommendations  Diet recommendations: Dysphagia 2 (fine chop);Thin liquid Liquids provided via: Cup;Straw Medication Administration: Crushed with puree Supervision: Staff to assist with self feeding;Full supervision/cueing for compensatory strategies Compensations: Slow rate;Small sips/bites;Minimize environmental distractions Postural Changes and/or Swallow Maneuvers: Seated upright 90 degrees                Oral Care Recommendations: Oral care BID Follow up Recommendations: None SLP Visit Diagnosis: Dysphagia, oral phase (R13.11) Plan: Continue with current plan of care       GO                [name], Lynise Porr 07/14/2017, 12:14 PM    Maxcine HamLaura Paiewonsky, M.A. CCC-SLP [phone]

## 2017-07-15 ENCOUNTER — Ambulatory Visit: Payer: Self-pay | Admitting: Neurology

## 2017-07-15 DIAGNOSIS — R079 Chest pain, unspecified: Secondary | ICD-10-CM

## 2017-07-15 LAB — GLUCOSE, CAPILLARY
GLUCOSE-CAPILLARY: 145 mg/dL — AB (ref 65–99)
GLUCOSE-CAPILLARY: 139 mg/dL — AB (ref 65–99)
Glucose-Capillary: 145 mg/dL — ABNORMAL HIGH (ref 65–99)

## 2017-07-15 LAB — CBC
HEMATOCRIT: 34.5 % — AB (ref 39.0–52.0)
Hemoglobin: 12.4 g/dL — ABNORMAL LOW (ref 13.0–17.0)
MCH: 24.9 pg — AB (ref 26.0–34.0)
MCHC: 35.9 g/dL (ref 30.0–36.0)
MCV: 69.4 fL — AB (ref 78.0–100.0)
PLATELETS: 218 10*3/uL (ref 150–400)
RBC: 4.97 MIL/uL (ref 4.22–5.81)
RDW: 15.6 % — ABNORMAL HIGH (ref 11.5–15.5)
WBC: 7.8 10*3/uL (ref 4.0–10.5)

## 2017-07-15 LAB — BASIC METABOLIC PANEL
ANION GAP: 9 (ref 5–15)
BUN: 23 mg/dL — AB (ref 6–20)
CO2: 22 mmol/L (ref 22–32)
Calcium: 9.1 mg/dL (ref 8.9–10.3)
Chloride: 104 mmol/L (ref 101–111)
Creatinine, Ser: 1.03 mg/dL (ref 0.61–1.24)
GFR calc Af Amer: >60 mL/min (ref >60)
GFR calc non Af Amer: >60 mL/min (ref >60)
GLUCOSE: 139 mg/dL — AB (ref 65–99)
POTASSIUM: 3.8 mmol/L (ref 3.5–5.1)
Sodium: 135 mmol/L (ref 135–145)

## 2017-07-15 LAB — PROTIME-INR
INR: 2.68
Prothrombin Time: 28.3 seconds — ABNORMAL HIGH (ref 11.4–15.2)

## 2017-07-15 MED ORDER — WARFARIN SODIUM 7.5 MG PO TABS
7.5000 mg | ORAL_TABLET | Freq: Once | ORAL | Status: AC
  Administered 2017-07-15: 7.5 mg via ORAL
  Filled 2017-07-15: qty 1

## 2017-07-15 MED ORDER — TRAMADOL HCL 50 MG PO TABS: 50.0000 mg | ORAL_TABLET | Freq: Two times a day (BID) | ORAL | 0 refills | Status: AC | PRN

## 2017-07-15 MED ORDER — ENSURE ENLIVE PO LIQD: 237.0000 mL | Freq: Two times a day (BID) | ORAL | 12 refills | Status: AC

## 2017-07-15 MED ORDER — ADULT MULTIVITAMIN W/MINERALS CH: 1.0000 | ORAL_TABLET | Freq: Every day | ORAL | 0 refills | Status: DC

## 2017-07-15 MED ORDER — INSULIN GLARGINE 100 UNIT/ML ~~LOC~~ SOLN: 5.0000 [IU] | Freq: Every day | SUBCUTANEOUS | 11 refills | Status: AC

## 2017-07-15 MED ORDER — FUROSEMIDE 40 MG PO TABS: 40.0000 mg | ORAL_TABLET | Freq: Every day | ORAL | 0 refills | Status: AC

## 2017-07-15 MED ORDER — QUETIAPINE FUMARATE 25 MG PO TABS: 25.0000 mg | ORAL_TABLET | Freq: Two times a day (BID) | ORAL | 0 refills | Status: AC

## 2017-07-15 MED ORDER — DIGOXIN 125 MCG PO TABS: 0.1250 mg | ORAL_TABLET | Freq: Every day | ORAL | 0 refills | Status: AC

## 2017-07-15 MED FILL — ?FUROSEMIDE 40 MG TABLET: 40 | 30 days supply | Qty: 30 | Fill #0

## 2017-07-15 MED FILL — !LANTUS 100 UNITS/ML VIAL: 100 | 28 days supply | Qty: 10 | Fill #0

## 2017-07-15 MED FILL — QUETIAPINE FUMARATE 25 MG T: 25 | 30 days supply | Qty: 60 | Fill #0

## 2017-07-15 MED FILL — ?DIGITEK 125 MCG TABLET: 125 | 30 days supply | Qty: 30 | Fill #0

## 2017-07-15 NOTE — Progress Notes (Deleted)
HPI: follow-up coronary artery disease status post coronary artery bypassing graft, ischemic cardiomyopathy and congestive heart failure. Pt originally from [name] and has history of prior coronary artery bypassing graft. Admitted with dyspnea 8/18. Nuclear study August 2018 showed prior inferior, inferolateral infarct but no ischemia. Ejection fraction less than 30%. CTA August 2018 showed no pulmonary embolus but there was an ascending thoracic aortic aneurysm measuring 4.6 cm. Readmitted with dyspnea 9/18 and had CVA and left popliteal acute embolic occlusion. CTA September 2018 showed large vessel occlusion of right middle cerebral artery. Patient had thrombectomyof right M1. Patient also had thrombectomy of right popliteal embolic occlusion. Last echocardiogram November 2018 showed ejection fraction 15-20%, severe left atrial  Right atrial and right ventricular enlargement and mildly elevated pulmonary pressure. Placed on coumadin. Readmitted November 2018 with encephalopathy and CHF. Patient was treated with milrinone. Prognosis felt to be poor and hospice recommended. Since last seen,   No current facility-administered medications for this visit.    No current outpatient medications on file.   Facility-Administered Medications Ordered in Other Visits  Medication Dose Route Frequency Provider Last Rate Last Dose  . acetaminophen (TYLENOL) tablet 650 mg  650 mg Oral Q4H PRN Hillary BowGardner, Jared M, DO   650 mg at 07/10/17 1219  . aspirin EC tablet 81 mg  81 mg Oral Daily Hillary BowGardner, Jared M, DO   81 mg at 07/14/17 0759  . atorvastatin (LIPITOR) tablet 80 mg  80 mg Oral q1800 Hillary BowGardner, Jared M, DO   80 mg at 07/14/17 [id number]  . digoxin (LANOXIN) tablet 0.125 mg  0.125 mg Oral Daily Bensimhon, Bevelyn Bucklesaniel R, MD   0.125 mg at 07/14/17 0800  . feeding supplement (ENSURE ENLIVE) (ENSURE ENLIVE) liquid 237 mL  237 mL Oral BID BM Briant CedarEzenduka, Nkeiruka J, MD   237 mL at 07/14/17 1822  . furosemide (LASIX) tablet 40 mg   40 mg Oral Daily Graciella Freerillery, Michael Andrew, PA-C      . haloperidol lactate (HALDOL) injection 2 mg  2 mg Intravenous Q6H PRN Rolly SalterPatel, Pranav M, MD   2 mg at 07/05/17 1553  . insulin aspart (novoLOG) injection 0-15 Units  0-15 Units Subcutaneous TID WC Graciella Freerillery, Michael Andrew, PA-C   5 Units at 07/14/17 1730  . insulin aspart (novoLOG) injection 0-5 Units  0-5 Units Subcutaneous QHS Pearson GrippeKim, James, MD   2 Units at 07/06/17 2137  . insulin glargine (LANTUS) injection 5 Units  5 Units Subcutaneous QHS Graciella Freerillery, Michael Andrew, PA-C   5 Units at 07/14/17 2149  . multivitamin with minerals tablet 1 tablet  1 tablet Oral Daily Briant CedarEzenduka, Nkeiruka J, MD   1 tablet at 07/14/17 0800  . pantoprazole (PROTONIX) EC tablet 40 mg  40 mg Oral Daily Lyda PeroneGardner, Jared M, DO   40 mg at 07/14/17 0801  . QUEtiapine (SEROQUEL) tablet 25 mg  25 mg Oral BID Rolly SalterPatel, Pranav M, MD   25 mg at 07/14/17 2149  . sodium chloride (OCEAN) 0.65 % nasal spray 1 spray  1 spray Each Nare PRN Hillary BowGardner, Jared M, DO   1 spray at 07/09/17 2053  . spironolactone (ALDACTONE) tablet 12.5 mg  12.5 mg Oral Daily Pearson GrippeKim, James, MD   12.5 mg at 07/14/17 0759  . Warfarin - Pharmacist Dosing Inpatient   Does not apply q1800 Hillary BowGardner, Jared M, DO         Past Medical History:  Diagnosis Date  . CAD (coronary artery disease) 2007   a. CABG 2007  in [place].  . Cerebrovascular disease   . Chronic systolic CHF (congestive heart failure) ([name])    a. EF 20-25% by echo in 03/2017 with NST showing prior infarct but no ischemia. Prior LVEF not known but patient reported history of weak heart.  . Diabetes mellitus without complication ([name])   . Hyperlipidemia   . Hypertension   . Lower extremity embolism ([name])    a. 04/2017 s/p thrombectomy of the right femoropopliteal artery, right below-the-knee popliteal artery endarterectomy with bovine patch angioplasty, and proximal endarterectomy of the right tibioperoneal trunk and peroneal artery..  . Pulmonary embolism ([name])    . PVC's (premature ventricular contractions)   . Stroke [facility]([name])    a. ~2016, also 04/2017 with acute R MCA occlusion s/p thrombectomy.  . Thoracic aortic aneurysm ([name])    a. 4.2 vs 4.6cm by different CTs Aug/Sept 2018.    Past Surgical History:  Procedure Laterality Date  . CARDIAC SURGERY    . CYSTOSCOPY N/A 04/25/2017   Procedure: CYSTOSCOPY FLEXIBLE;  Surgeon: Fransisco Hertzhen, Karly Pitter L, MD;  [facility];  Service: Vascular;  Laterality: N/A;  . EMBOLECTOMY Right 04/25/2017   Procedure: EMBOLECTOMY RIGHT FEMORAL ARTERY;  Surgeon: Fransisco Hertzhen, Julus Kelley L, MD;  [facility];  Service: Vascular;  Laterality: Right;  . IR PERCUTANEOUS ART THROMBECTOMY/INFUSION INTRACRANIAL INC DIAG ANGIO  04/25/2017  . PATCH ANGIOPLASTY Right 04/25/2017   Procedure: PATCH ANGIOPLASTY USING Livia SnellenXENOSURE BIOLOGIC PATCH;  Surgeon: Fransisco Hertzhen, Kajal Scalici L, MD;  [facility];  Service: Vascular;  Laterality: Right;  . PROSTATE SURGERY  2002  . RADIOLOGY WITH ANESTHESIA N/A 04/24/2017   Procedure: RADIOLOGY WITH ANESTHESIA Code Stroke;  Surgeon: Gilmer MorWagner, Jaime, DO;  Location: [facility] OR;  Service: Anesthesiology;  Laterality: N/A;    Social History   Socioeconomic History  . Marital status: Married    Spouse name: Not on file  . Number of children: Not on file  . Years of education: Not on file  . Highest education level: Not on file  Social Needs  . Financial resource strain: Not on file  . Food insecurity - worry: Not on file  . Food insecurity - inability: Not on file  . Transportation needs - medical: Not on file  . Transportation needs - non-medical: Not on file  Occupational History  . Not on file  Tobacco Use  . Smoking status: Never Smoker  . Smokeless tobacco: Never Used  Substance and Sexual Activity  . Alcohol use: No  . Drug use: No  . Sexual activity: Not on file  Other Topics Concern  . Not on file  Social History Narrative   Pt is from [name] and speaks [place]. He came [place] in 02/2017.   He has a daughter and  son-in-law that speak [place].     Family History  Problem Relation Age of Onset  . Hypertension Mother   . Hypertension Sister     ROS: no fevers or chills, productive cough, hemoptysis, dysphasia, odynophagia, melena, hematochezia, dysuria, hematuria, rash, seizure activity, orthopnea, PND, pedal edema, claudication. Remaining systems are negative.  Physical Exam: Well-developed well-nourished in no acute distress.  Skin is warm and dry.  HEENT is normal.  Neck is supple.  Chest is clear to auscultation with normal expansion.  Cardiovascular exam is regular rate and rhythm.  Abdominal exam nontender or distended. No masses palpated. Extremities show no edema. neuro grossly intact  ECG- personally reviewed  A/P  1  Devin MillersBrian Ayano Douthitt, MD

## 2017-07-15 NOTE — Progress Notes (Signed)
Daily Progress Note   Patient Name: [name]       Date: 07/15/2017 DOB: 24-Feb-1946  Age: 71 y.o. MRN#: [id number] Attending Physician: Kerney Elbe, DO Primary Care Physician: Arnoldo Morale, MD Admit Date: 06/30/2017  Reason for Consultation/Follow-up: Establishing goals of care, Hospice Evaluation and Psychosocial/spiritual support  Subjective: Patient walking unit with PT, daughter, walker, and chair. Appeared weak and short of breath.  Length of Stay: 14  Current Medications: Scheduled Meds:  . aspirin EC  81 mg Oral Daily  . atorvastatin  80 mg Oral q1800  . digoxin  0.125 mg Oral Daily  . feeding supplement (ENSURE ENLIVE)  237 mL Oral BID BM  . furosemide  40 mg Oral Daily  . insulin aspart  0-15 Units Subcutaneous TID WC  . insulin aspart  0-5 Units Subcutaneous QHS  . insulin glargine  5 Units Subcutaneous QHS  . multivitamin with minerals  1 tablet Oral Daily  . pantoprazole  40 mg Oral Daily  . QUEtiapine  25 mg Oral BID  . spironolactone  12.5 mg Oral Daily  . warfarin  7.5 mg Oral ONCE-1800  . Warfarin - Pharmacist Dosing Inpatient   Does not apply q1800    Continuous Infusions:   PRN Meds: acetaminophen, haloperidol lactate, sodium chloride  Physical Exam  Constitutional: No distress.  Weak, thin  HENT:  Head: Normocephalic and atraumatic.  Cardiovascular: Normal rate and intact distal pulses.  Pulmonary/Chest: Effort normal. No respiratory distress.  Abdominal: Soft. Bowel sounds are normal. There is no tenderness.  Neurological: He is alert. He is disoriented.  Skin: Skin is warm and dry. He is not diaphoretic.            Vital Signs: BP 112/68 (BP Location: Left Arm)   Pulse 98   Temp (!) 97.5 F (36.4 C) (Oral)   Resp (!) 32   Ht '5\' 10"'  (1.778  m)   Wt 73.8 kg (162 lb 11.2 oz)   SpO2 100%   BMI 23.34 kg/m  SpO2: SpO2: 100 % O2 Device: O2 Device: Not Delivered O2 Flow Rate: O2 Flow Rate (L/min): 2 L/min  Intake/output summary:   Intake/Output Summary (Last 24 hours) at 07/15/2017 1331 Last data filed at 07/15/2017 0900 Gross per 24 hour  Intake 170 ml  Output -  Net 170 ml   LBM: Last BM Date: 07/14/17 Baseline Weight: Weight: 76.7 kg (169 lb) Most recent weight: Weight: 73.8 kg (162 lb 11.2 oz)       Palliative Assessment/Data: 50%    Flowsheet Rows     Most Recent Value  Intake Tab  Referral Department  Hospitalist  Unit at Time of Referral  Intermediate Care Unit  Palliative Care Primary Diagnosis  Cardiac  Date Notified  07/08/17  Palliative Care Type  New Palliative care  Reason for referral  Clarify Goals of Care, Counsel Regarding Hospice  Date of Admission  06/30/17  Date first seen by Palliative Care  07/09/17  # of days Palliative referral response time  1 Day(s)  # of days IP prior to Palliative referral  8  Clinical Assessment  Palliative Performance Scale Score  50%  Psychosocial & Spiritual Assessment  Palliative Care Outcomes  Patient/Family meeting held?  Yes  Who was at the meeting?  patient and daughter  Palliative Care Outcomes  Transitioned to hospice, Counseled regarding hospice      Patient Active Problem List   Diagnosis Date Noted  . Encephalopathy   . Palliative care by specialist   . Goals of care, counseling/discussion   . CAP (community acquired pneumonia) 07/01/2017  . CAD (coronary artery disease) 05/27/2017  . Chronic combined systolic and diastolic heart failure ([place]) 05/27/2017  . Long term (current) use of anticoagulants [Z79.01] 05/16/2017  . Chronic anticoagulation   . Shortness of breath   . Diabetes mellitus type 2 in nonobese ([name])   . Subtherapeutic anticoagulation   . Neuropathic pain   . AKI (acute kidney injury) ([place])   . Right middle cerebral artery  stroke ([place]) 05/05/2017  . Coronary artery disease involving coronary bypass graft of native heart without angina pectoris   . Type 2 diabetes mellitus with peripheral neuropathy ([name])   . Benign essential HTN   . Hyperlipidemia   . Acute blood loss anemia   . Anemia of chronic disease   . History of medication noncompliance   . Ischemia of right lower extremity   . Endotracheally intubated   . Ischemia of left lower extremity   . Respiratory failure ([place])   . Occlusion of left carotid artery   . Urinary retention   . Ischemic cardiomyopathy   . S/P CABG (coronary artery bypass graft)   . Cerebral embolism with cerebral infarction 04/25/2017  . Acute on chronic systolic heart failure ([place]) 04/24/2017  . Hyponatremia 04/24/2017  . CHF (congestive heart failure) ([place]) 04/17/2017    Palliative Care Assessment & Plan   HPI: 72 y.o.malewith past medical history of systolic CHF (EF 35-36% on 07/07/17), CAD (CABG 2007 in [place]), R MCA stroke in September 2018 s/p mechanical thrombectomy, T2DM, HTN, HLD, and a thoracic aneurysmadmitted on 11/12/2018with weight loss and confusion.Per PCP, patient had lost 11 pounds in 1 month and had a poor appetite. Was found to have UTI, possible pneumonia, and CHF exacerbation. BNP >2000 and signs of volume overload. UTI/?pna treated with IV Unasyn and oral Augmentin. Blood cultures negative.  Patient has continued to experience altered mental status throughout admission - per daughter confusion has been ongoing for last 3-4 weeks. CT scan unremarkable, MRI rules out acute intracranial abnormality, EEG shows encephalopathy but no seizure activity, ammonia normal, and ABG shows no hypercarbia. Receiving PRN haldol and Seroquel for agitation.   Was diuresed with IV lasix. Ptwason milrinone trial 07/07/17 - 07/10/17. Patient has continued to complain of chest pain and shortness of breath throughout admission. He has also had issues with urinary  retention this admission.   Of note, patient is from [place] and speaks [place]. His daughter speaks [place] well. Patient has been admitted 4 times in previous 6 months. Daughter has been providing 24/7 care to the patient at home.All other family lives in [place], including other children and patient's wife.  Hospice recommended by cardiology and PMT consulted for [place].  Assessment: Met w/ patient and daughter at bedside. Patient confused. He ate ~50% of lunch and ambulated in hallways. Had to take breaks. Plans to take him home with Hospice support today. Called Dr. Alfredia Ferguson to discuss discharge plan. Daughter tells me necessary equipment to care for her dad at home has been requested.   Her mom and brother were both granted passports and are moving forward in the process  to come see the patient.   Recommendations/Plan:  Home w/ Hospice - will need medications for dyspnea as disease progresses. Stable now.   Goals of Care and Additional Recommendations:  Limitations on Scope of Treatment: Full Scope Treatment  Code Status:  Full code  Prognosis:   < 6 months  Discharge Planning:  Home with Hospice  Care plan was discussed with daughter, Dr. Garwin Brothers  Thank you for allowing the Palliative Medicine Team to assist in the care of this patient.   Total Time 15 minutes Prolonged Time Billed  no       Greater than 50%  of this time was spent counseling and coordinating care related to the above assessment and plan.  Juel Burrow, DNP, AGNP-C Palliative Medicine Team Team Phone # [phone]

## 2017-07-15 NOTE — Discharge Summary (Signed)
Physician Discharge Summary  [name] ATF:[id number] DOB: [birth year] DOA: 06/30/2017  PCP: Arnoldo Morale, MD  Admit date: 06/30/2017 Discharge date: 07/15/2017  Admitted From: Home Disposition: Home with [facility] as patient cannot go to SNF  Recommendations for Outpatient Follow-up:  1. Follow up Care per Hospice Protocol  Home Health: Yes Equipment/Devices: 3N1, Hospital Bed, OBT  Discharge Condition: Guarded  CODE STATUS: FULL CODE  Diet recommendation: Dysphagia 2 Heart Healthy Carb Modified with Thin Liquids  Brief/Interim Summary: The Pt. Is a 71 yo M with PMH of CAD, chronic systolic CHF, right MCA stroke, type II DM, HTN; admitted on 06/30/2017, presented with complaint of confusion, was found to have UTI, community acquired pneumonia, acute on chronic CHF started on IV milrinone. Cardiology consulted and made recommendations however prognosis has been poor so Hospice was recommended by Cardiology. Patient unable to go to SNF and unfortunately does not qualify for Residential Hospice. Will D/C Home with Home Hospice Today.   Discharge Diagnoses:  Principal Problem:   CAP (community acquired pneumonia) Active Problems:   Acute on chronic systolic heart failure ([name])   Type 2 diabetes mellitus with peripheral neuropathy ([name])   Benign essential HTN   Right middle cerebral artery stroke ([place])   Encephalopathy   Palliative care by specialist   Goals of care, counseling/discussion  Acute metabolic encephalopathy. -unclear etiology, possibly 2/2 enteroccocus feacalis UTI, present on admission -Symptomatology has been ongoing for last 3-4 weeks per patient's daughter. -CT scan unremarkable, ABG shows no hypercarbia. -Troponin negative EKG negative. -No new focal deficit on examination right now other than confusion. -Already treated with antibiotics with IV Unasyn, and oral Augmentin. No fever and non toxic in appearance. -EEG completed,shows metabolic  encephalopathy, no active seizure focus. -MRI rules out any acute intracranial abnormality. -Ammonia level mildly elevated, recheck stable. -PRN Haldol, Seroquel.  Discontinuing all other psychotropic medications. -Continue holding gabapentin -Continue constant re-orientation and supportive care. -Overall better according to daughter; but continue to be intermittently confused -Poor Prognosis and will be D/C'ing Home with Hospice  Enteroccocus fecaelis UTI and community-acquired pneumonia, both present on admission. -Urine with enterococcus sensitive to all antibiotics. -There was a question of patient having pneumonia as well (concerns for aspiration). -Patient completed antibiotic treatment using unasyn and then Augmentin. -Blood cultures negative fo any bacterial growth. -Patient has remained afebrile  Acute on chronic systolic CHF/Acute Hypoxic Respiratory Failure. -Echocardiogram shows 15-20% EF with diffuse hypokinesis. -Patient was diuresed with IV Lasix. W/o significant improvement and required to be started on milrinone.  At that moment he had good response and diuresis while maintaining blood pressure. -Pt is already on therapeutic anti-coagulation with warfarin (INR therapeutic). -Continued oxygen supplementation and continue weaning off as tolerated. -Continue digoxin and spironolactone as per cardiology recc's -Milrinone stopped 11/23 because became more tachypenic  -Cardiology recommending Hospice given poor prognosis -Recommending starting po Lasix 40 mg po Daily  -Per Card's BP too soft for BB or ACE/ARB/ARNI -No active Dyspnea but will likely need medication eventually per Hospice Protocol  Multiple PVCs -NSVT. -Monitor electrolytes and replete as will continue watching off milrinone needed. -C/w Digoxin 0.125 mg po Daily  -BP too soft to restart Bidil   Coronary Artery Disease. -Two-vessel CABG in 2007. -Continue telemetry monitoring -Patient has remained  chest pain-free -Continue aspirin and statins; no beta-blocker secondary to low blood pressure.  Type II DM. -Continue Moderate Novolog Sliding Scale Insulin AC/HS and Lantus 5 units sq Daily  -On Dysphagia 2 Diet  -  Follow CBG and further adjust hypoglycemic regimen as needed -Patient barely eating 50% of his meals.  Recent right MCA stroke in 2018 -S/P mechanical thrombectomy on last admission. -Will continue aspirin and Coumadin for secondary prevention  -INR 2.68  -Continue Atorvastatin 80 mg po Daily -Follow Anticoagulation with Hospice   Chest pain. -Most likely demand ischemia in the presence of CHF exacerbation -Currently chest pain-free  Goals of care discussion. -Patient with decompensated CHF, progressively worsening mentation without any reversible cause, recent acute CVA. -Patient also with poor p.o. intake and dysphagia 2. -daughter realizing bad shape of her dad's health; palliative care on board and helping with [facility]. -Overall poor prognosis; Unable to go to SNF and does not qualify for Residential Hospice. 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   Complete by:  As directed    Call MD for:  difficulty breathing, headache or visual disturbances   Complete by:  As directed    Call MD for:  extreme fatigue   Complete by:  As directed    Call MD for:  hives   Complete by:  As directed    Call MD for:  persistant  dizziness or light-headedness   Complete by:  As directed    Call MD for:  persistant nausea and vomiting   Complete by:  As directed    Call MD for:  redness, tenderness, or signs of infection (pain, swelling, redness, odor or green/yellow discharge around incision site)   Complete by:  As directed    Call MD for:  severe uncontrolled pain   Complete by:  As directed    Call MD for:  temperature >100.4   Complete by:  As directed    Diet - low sodium heart healthy   Complete by:  As directed    DYSPHAGIA 2   Diet Carb Modified   Complete by:  As directed    DYSPHAGIA 2 Diet   Discharge instructions   Complete by:  As directed    Follow up Care per Hospice Protocol   Increase activity slowly   Complete by:  As directed      Allergies as of 07/15/2017   No Known Allergies     Medication List    STOP taking these medications   gabapentin 300 MG capsule Commonly known as:  NEURONTIN   insulin glargine 100 unit/mL Sopn Commonly known as:  LANTUS Replaced by:  insulin glargine 100 UNIT/ML injection   losartan 25 MG tablet Commonly known as:  COZAAR     TAKE these medications   acetaminophen 325 MG tablet Commonly known as:  TYLENOL Take 2 tablets (650 mg total) by mouth every 4 (four) hours as needed for headache or mild pain.   aspirin 81 MG EC tablet Take 1 tablet (81 mg total) by mouth daily.   atorvastatin 80 MG tablet Commonly known as:  LIPITOR Take 1 tablet (80 mg  total) by mouth daily at 6 PM.   digoxin 0.125 MG tablet Commonly known as:  LANOXIN Take 1 tablet (0.125 mg total) by mouth daily. Start taking on:  07/16/2017   feeding supplement (ENSURE ENLIVE) Liqd Take 237 mLs by mouth 2 (two) times daily between meals.   furosemide 40 MG tablet Commonly known as:  LASIX Take 1 tablet (40 mg total) by mouth daily. Start taking on:  07/16/2017 What changed:  medication strength   glucose blood test strip Commonly known as:  TRUE METRIX BLOOD GLUCOSE  TEST Use as instructed   insulin glargine 100 UNIT/ML injection Commonly known as:  LANTUS Inject 0.05 mLs (5 Units total) into the skin at bedtime. Replaces:  insulin glargine 100 unit/mL Sopn   lactulose 10 GM/15ML solution Commonly known as:  CHRONULAC Take 15 mLs (10 g total) by mouth 3 (three) times daily. What changed:  when to take this   multivitamin with minerals Tabs tablet Take 1 tablet by mouth daily. Start taking on:  07/16/2017   NOVOLOG 100 UNIT/ML injection Generic drug:  insulin aspart Inject 5 Units into the skin at bedtime.   pantoprazole 40 MG tablet Commonly known as:  PROTONIX Take 1 tablet (40 mg total) by mouth daily.   QUEtiapine 25 MG tablet Commonly known as:  SEROQUEL Take 1 tablet (25 mg total) by mouth 2 (two) times daily.   spironolactone 12.5 mg Tabs tablet Commonly known as:  ALDACTONE Take 12.5 mg by mouth daily.   traMADol 50 MG tablet Commonly known as:  ULTRAM Take 1 tablet (50 mg total) by mouth every 12 (twelve) hours as needed for severe pain.   TRUE METRIX METER Devi 1 kit by Does not apply route 4 (four) times daily.   TRUEPLUS LANCETS 28G Misc 28 g by Does not apply route 4 (four) times daily.   warfarin 7.5 MG tablet Commonly known as:  COUMADIN Take as directed. If you are unsure how to take this medication, talk to your nurse or doctor. Original instructions:  Take 1 tablet (7.5 mg total) by mouth daily at 6 PM.       No Known Allergies  Consultations:  Palliative Care Medicine   Cardiology Dr. Haroldine Laws  Hospice  Procedures/Studies: Dg Chest 2 View  Result Date: 07/06/2017 CLINICAL DATA:  Increased dyspnea, assess for pulmonary edema. EXAM: CHEST  2 VIEW COMPARISON:  07/04/2017 CXR FINDINGS: Lung volumes are slightly lower in appearance however there does appear to be increase in interstitial edema since prior exam with small bilateral pleural effusions, right greater than left. Heart is enlarged but  stable. The patient is status post CABG. There is mild aortic atherosclerosis at the arch. Degenerative changes are seen about both shoulders. No acute osseous abnormality is noted. IMPRESSION: 1. Stable cardiomegaly with aortic atherosclerosis. 2. Interval development of mild to moderate CHF with small bilateral pleural effusions. Electronically Signed   By: Ashley Royalty M.D.   On: 07/06/2017 00:21   Ct Head Wo Contrast  Result Date: 07/04/2017 CLINICAL DATA:  Altered mental status EXAM: CT HEAD WITHOUT CONTRAST TECHNIQUE: Contiguous axial images were obtained from the base of the skull through the vertex without intravenous contrast. COMPARISON:  Head CT 06/30/2017 FINDINGS: Brain: No mass lesion, intraparenchymal hemorrhage or extra-axial collection. No evidence of acute cortical infarct. There is periventricular hypoattenuation compatible with chronic microvascular disease. Old right thalamic and left cerebellar small infarcts. Vascular: No hyperdense vessel or unexpected calcification. Skull: Normal visualized skull base, calvarium  and extracranial soft tissues. Sinuses/Orbits: No sinus fluid levels or advanced mucosal thickening. No mastoid effusion. Normal orbits. IMPRESSION: Unchanged examination without acute intracranial abnormality. Electronically Signed   By: Ulyses Jarred M.D.   On: 07/04/2017 14:28   Ct Head Wo Contrast  Result Date: 06/30/2017 CLINICAL DATA:  71 year old hypertensive diabetic male with altered mental status. Confusion and dizziness. History of strokes. Initial encounter. EXAM: CT HEAD WITHOUT CONTRAST TECHNIQUE: Contiguous axial images were obtained from the base of the skull through the vertex without intravenous contrast. COMPARISON:  04/28/2017 brain MR. 04/26/2017 head CT. FINDINGS: Brain: No intracranial hemorrhage or CT evidence of large acute infarct. Remote right lenticular nucleus, caudate and right thalamic infarct. Remote tiny cerebellar infarcts. Moderate to  marked small vessel disease. Global atrophy. No intracranial mass lesion noted on this unenhanced exam. Prominent falx calcifications unchanged. Vascular: Vascular calcifications Skull: No acute abnormality. Sinuses/Orbits: No acute orbital abnormality. Visualized paranasal sinuses are clear. Other: Mastoid air cells and middle ear cavities are clear. IMPRESSION: No intracranial hemorrhage or CT evidence of large acute infarct. Remote right lenticular nucleus, right caudate and right thalamic infarct. Remote tiny bilateral cerebellar infarcts. Moderate to marked small vessel disease. Global atrophy. Electronically Signed   By: Genia Del M.D.   On: 06/30/2017 20:18   Mr Brain Wo Contrast  Result Date: 07/07/2017 CLINICAL DATA:  71 y/o  M; 1 week of altered mental status. EXAM: MRI HEAD WITHOUT CONTRAST TECHNIQUE: Axial DWI, coronal DWI, axial T2 FLAIR propeller, axial gradient echo, axial T2 propeller, and axial T1 FLAIR propeller sequences were acquired. Due to patient's altered mental status no additional images were acquired. COMPARISON:  07/04/2017 CT head.  04/28/2017 MRI head. FINDINGS: Brain: Motion degraded study. No reduced diffusion to suggest acute or early subacute infarction. Stable brain parenchymal volume loss and chronic microvascular ischemic changes. Stable ventricle size. No interval susceptibility hypointensity to indicate intracranial hemorrhage. No focal mass effect or extra-axial collection. Small chronic lacunar infarctions are present within right putamen, thalamus, and caudate body. Vascular: Normal flow voids. Skull and upper cervical spine: Normal marrow signal. Sinuses/Orbits: Negative. Other: None. IMPRESSION: 1. Extensive motion degradation. 2. No acute intracranial abnormality identified. 3. Stable chronic microvascular ischemic changes and parenchymal volume loss of the brain. Electronically Signed   By: Kristine Garbe M.D.   On: 07/07/2017 21:38   Dg Chest Port 1  View  Result Date: 07/08/2017 CLINICAL DATA:  Shortness of breath EXAM: PORTABLE CHEST 1 VIEW COMPARISON:  07/05/2017 FINDINGS: Obscuration of both hemidiaphragms with mild enlargement of the cardiopericardial silhouette. Prior median sternotomy. Atherosclerotic calcification of the aortic arch. IMPRESSION: 1. Indistinct obscuration of both hemidiaphragms potentially from her space opacities or layering pleural effusions. Relative clearing of the upper lobes compared to 07/05/17. 2. Stable enlargement of the cardiopericardial silhouette. 3.  Aortic Atherosclerosis (ICD10-I70.0). Electronically Signed   By: Van Clines M.D.   On: 07/08/2017 19:15   Dg Chest Port 1 View  Result Date: 07/04/2017 CLINICAL DATA:  Shortness of breath. EXAM: PORTABLE CHEST 1 VIEW COMPARISON:  06/30/2017 FINDINGS: External pacemaker overlies the chest. Previous median sternotomy. Chronic cardiomegaly. Bilateral lower lobe atelectasis and/or pneumonia persists. Upper lungs are clear. No effusions. No acute bone finding. IMPRESSION: Persistent abnormal bilateral lower lobe density consistent with atelectasis and/or pneumonia. Electronically Signed   By: Nelson Chimes M.D.   On: 07/04/2017 13:26   Dg Chest Portable 1 View  Result Date: 06/30/2017 CLINICAL DATA:  Confusion EXAM: PORTABLE CHEST 1 VIEW COMPARISON:  05/06/2017 FINDINGS: Post sternotomy changes. Mild cardiomegaly. Hazy bibasilar opacity. No pneumothorax. IMPRESSION: Hazy bibasilar opacities suspicious for pneumonia. Cardiomegaly with mild vascular congestion. Electronically Signed   By: Donavan Foil M.D.   On: 06/30/2017 19:57   Dg Foot Complete Right  Result Date: 06/30/2017 CLINICAL DATA:  Right foot pain.  Nonhealing ulcer. EXAM: RIGHT FOOT COMPLETE - 3+ VIEW COMPARISON:  04/22/2017 FINDINGS: No sign of osteomyelitis. No fracture or dislocation. Ordinary mild midfoot osteoarthritis. Regional arterial calcification is present. IMPRESSION: No sign of  osteomyelitis.  No other acute bone or joint finding. Electronically Signed   By: Nelson Chimes M.D.   On: 06/30/2017 13:08    Subjective: Seen and examined at bedside with nurse and Translator assistance with [facility] #[id number]. Patient states he has no complaints and wants to go home. No CP and SOB is stable. No other concerns  Discharge Exam: Vitals:   07/15/17 0754 07/15/17 1129  BP: 104/66 112/68  Pulse: 85 98  Resp: (!) 22 (!) 32  Temp: 98.2 F (36.8 C) (!) 97.5 F (36.4 C)  SpO2: 100% 100%   Vitals:   07/14/17 2346 07/15/17 0406 07/15/17 0754 07/15/17 1129  BP: 111/77 100/81 104/66 112/68  Pulse: (!) 102 93 85 98  Resp: 18 (!) 22 (!) 22 (!) 32  Temp: 97.8 F (36.6 C) 98.9 F (37.2 C) 98.2 F (36.8 C) (!) 97.5 F (36.4 C)  TempSrc: Axillary Axillary Oral Oral  SpO2: 99% 93% 100% 100%  Weight:  73.8 kg (162 lb 11.2 oz)    Height:       General: Pt is alert, awake, not in acute distress Cardiovascular: RRR, S1/S2 +, no rubs, no gallops Respiratory: CTA bilaterally, no wheezing, no rhonchi Abdominal: Soft, NT, ND, bowel sounds + Extremities: no edema, no cyanosis  The results of significant diagnostics from this hospitalization (including imaging, microbiology, ancillary and laboratory) are listed below for reference.    Microbiology: No results found for this or any previous visit (from the past 240 hour(s)).   Labs: BNP (last 3 results) Recent Labs    05/27/17 1131 06/30/17 2200 07/06/17 0045  BNP 1,040.4* 2,434.3* 3,559.7*   Basic Metabolic Panel: Recent Labs  Lab 07/10/17 0330 07/11/17 0346 07/12/17 0452 07/14/17 0928 07/15/17 0541  NA 139 137 136 134* 135  K 3.9 4.0 4.1 4.0 3.8  CL 104 105 103 103 104  CO2 25 21* _0 GLUCOSE 156* 162* 154* 190* 139*  BUN 23* 22* 24* 23* 23*  CREATININE 1.07 1.00 1.02 0.99 1.03  CALCIUM 9.2 9.2 9.0 9.0 9.1   Liver Function Tests: No results for input(s): AST, ALT, ALKPHOS, BILITOT, PROT, ALBUMIN in the last  168 hours. No results for input(s): LIPASE, AMYLASE in the last 168 hours. Recent Labs  Lab 07/09/17 0409 07/10/17 0330  AMMONIA 22 16   CBC: Recent Labs  Lab 07/11/17 0346 07/12/17 0452 07/13/17 0354 07/14/17 0329 07/15/17 0541  WBC 9.2 8.4 8.8 9.3 7.8  HGB 12.6* 12.5* 12.2* 12.4* 12.4*  HCT 35.3* 34.3* 33.8* 34.7* 34.5*  MCV 70.5* 69.4* 69.0* 70.2* 69.4*  PLT 238 231 208 228 218   Cardiac Enzymes: No results for input(s): CKTOTAL, CKMB, CKMBINDEX, TROPONINI in the last 168 hours. BNP: Invalid input(s): POCBNP CBG: Recent Labs  Lab 07/14/17 1117 07/14/17 1640 07/14/17 2006 07/15/17 0756 07/15/17 1135  GLUCAP 208* 203* 174* 145* 145*   D-Dimer No results for input(s): DDIMER in the last 72 hours. Hgb A1c  No results for input(s): HGBA1C in the last 72 hours. Lipid Profile No results for input(s): CHOL, HDL, LDLCALC, TRIG, CHOLHDL, LDLDIRECT in the last 72 hours. Thyroid function studies No results for input(s): TSH, T4TOTAL, T3FREE, THYROIDAB in the last 72 hours.  Invalid input(s): FREET3 Anemia work up No results for input(s): VITAMINB12, FOLATE, FERRITIN, TIBC, IRON, RETICCTPCT in the last 72 hours. Urinalysis    Component Value Date/Time   COLORURINE YELLOW 06/30/2017 2030   APPEARANCEUR HAZY (A) 06/30/2017 2030   LABSPEC 1.018 06/30/2017 2030   [place] 5.0 06/30/2017 2030   GLUCOSEU NEGATIVE 06/30/2017 2030   [place] NEGATIVE 06/30/2017 2030   [place] NEGATIVE 06/30/2017 2030   BILIRUBINUR negative 06/30/2017 [place] 06/30/2017 2030   PROTEINUR NEGATIVE 06/30/2017 2030   UROBILINOGEN 1.0 06/30/2017 1518   NITRITE NEGATIVE 06/30/2017 2030   LEUKOCYTESUR SMALL (A) 06/30/2017 2030   Sepsis Labs Invalid input(s): PROCALCITONIN,  WBC,  LACTICIDVEN Microbiology No results found for this or any previous visit (from the past 240 hour(s)).  Time coordinating discharge: 35 minutes  SIGNED:  Kerney Elbe, DO [facility] 07/15/2017, 2:58 PM Pager [phone]  If 7PM-7AM, please contact night-coverage [email or web address] Password [email or web address]

## 2017-07-15 NOTE — Progress Notes (Signed)
Physical Therapy Treatment Patient Details Name: [name] MRN: [id number] DOB: April 02, 1946 Today's Date: 07/15/2017    History of Present Illness Pt is a 71 y/o male who presents with AMS ~1 week. He was found to have CAP, UTI, encephalopathy, and acute on chronic CHF. 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    PT Comments    Pt progressing slowly, but steadily.  He now presents lethargic, but able to control his trunk more.  Some of the hemiparesis has moderated and pt has more control over his left upper and lower extremities    Follow Up Recommendations  SNF;Supervision/Assistance - 24 hour(pt will be going home due to no payer sourch for SNF rehab)     Equipment Recommendations  3in1 (PT)    Recommendations for Other Services       Precautions / Restrictions Precautions Precautions: Fall Precaution Comments: Residual L side weakness from CVA in September 2018.     Mobility  Bed Mobility               General bed mobility comments: sitting up long-sitting in bed.  scooted to EOB with min guard.  Transfers Overall transfer level: Needs assistance Equipment used: Rolling walker (2 wheeled) Transfers: Sit to/from Stand Sit to Stand: Mod assist;+2 safety/equipment         General transfer comment: cues for technique, assist to help forward and boost up., upper trunk assist to attain full upright.  Ambulation/Gait Ambulation/Gait assistance: Mod assist;+2 safety/equipment Ambulation Distance (Feet): 75 Feet(then additional 40 feet after a rest) Assistive device: Rolling walker (2 wheeled) Gait Pattern/deviations: Step-through pattern   Gait velocity interpretation: Below normal speed for age/gender General Gait Details: Generally less L knee and overall instability.  Truncal assist still needing for upright posture and cues for general postural checks, but although quick to fatigue, pt more steady overall.   Stairs             Wheelchair Mobility    Modified Rankin (Stroke Patients Only)       Balance     Sitting balance-Leahy Scale: Fair       Standing balance-Leahy Scale: Poor Standing balance comment: reliant on the AD's or external support                            Cognition Arousal/Alertness: Awake/alert;Lethargic Behavior During Therapy: WFL for tasks assessed/performed Overall Cognitive Status: Impaired/Different from baseline                         Following Commands: Follows one step commands with increased time   Awareness: Intellectual Problem Solving: Slow processing;Decreased initiation        Exercises      General Comments        Pertinent Vitals/Pain Pain Assessment: Faces Faces Pain Scale: No hurt    Home Living                      Prior Function            PT Goals (current goals can now be found in the care plan section) Acute Rehab PT Goals PT Goal Formulation: With patient Time For Goal Achievement: 07/18/17 Potential to Achieve Goals: Fair Progress towards PT goals: Progressing toward goals    Frequency    Min 3X/week      PT Plan Current plan remains  appropriate    Co-evaluation              AM-PAC PT "6 Clicks" Daily Activity  Outcome Measure  Difficulty turning over in bed (including adjusting bedclothes, sheets and blankets)?: A Little Difficulty moving from lying on back to sitting on the side of the bed? : Unable Difficulty sitting down on and standing up from a chair with arms (e.g., wheelchair, bedside commode, etc,.)?: A Little Help needed moving to and from a bed to chair (including a wheelchair)?: A Lot Help needed walking in hospital room?: A Lot Help needed climbing 3-5 steps with a railing? : Total 6 Click Score: 12    End of Session   Activity Tolerance: Patient limited by fatigue;Patient tolerated treatment well Patient left: in chair;with call bell/phone within  reach;with chair alarm set;with family/visitor present Nurse Communication: Mobility status PT Visit Diagnosis: Unsteadiness on feet (R26.81);Muscle weakness (generalized) (M62.81);Hemiplegia and hemiparesis Hemiplegia - Right/Left: Left Hemiplegia - dominant/non-dominant: Non-dominant Hemiplegia - caused by: Cerebral infarction     Time: [id number] PT Time Calculation (min) (ACUTE ONLY): 16 min  Charges:  $Gait Training: 8-22 mins                    G Codes:       07/15/2017  [place] BingKen Cam Dauphin, PT [phone]  (pager)   Eliseo GumKenneth V Merion Grimaldo 07/15/2017, 4:03 PM

## 2017-07-15 NOTE — Progress Notes (Signed)
CSW following for disposition. Unable to place patient in SNF due to lack of insurance and no LOG. Patient will discharge home with hospice. RNCM following for needs. CSW signing off.  Abigail ButtsSusan Vanderbilt Ranieri, LCSWA [phone]

## 2017-07-15 NOTE — Progress Notes (Signed)
ANTICOAGULATION CONSULT NOTE - Follow Up Consult  Pharmacy Consult for: Coumadin Indication: h/o CVA  No Known Allergies  Patient Measurements: Height: 5\' 10"  (177.8 cm) Weight: 162 lb 11.2 oz (73.8 kg) IBW/kg (Calculated) : 73  Vital Signs: Temp: 98.2 F (36.8 C) (11/27 0754) Temp Source: Oral (11/27 0754) BP: 104/66 (11/27 0754) Pulse Rate: 85 (11/27 0754)  Labs: Recent Labs    07/13/17 0354 07/14/17 0329 07/14/17 0928 07/15/17 0541  HGB 12.2* 12.4*  --  12.4*  HCT 33.8* 34.7*  --  34.5*  PLT 208 228  --  218  LABPROT 29.4* 30.5*  --  28.3*  INR 2.82 2.95  --  2.68  CREATININE  --   --  0.99 1.03    Estimated Creatinine Clearance: 68.9 mL/min (by C-G formula based on SCr of 1.03 mg/dL).  Assessment: 71yom continues on coumadin for h/o CVA. INR changed from 2.95 to 2.68 today. CBC stable. No bleeding noted. No new medication interactions.  PTA dose: 11.25 mg on MWF and 7.5 mg AOD's per clinic, however the medication reconciliation states 7.5 mg daily.  Goal of Therapy:  INR 2-3 Monitor platelets by anticoagulation protocol: Yes   Plan:  1) Warfarin 7.5 mg tonight 2) Daily INR and CBC 3) Monitor signs/symptoms of bleeding 4) Follow along for anticoagulation plan with hospice  Girard CooterKimberly Perkins, PharmD Clinical Pharmacist  Pager: [phone] Clinical Phone for 07/15/2017 until 3:30pm: [phone] If after 3:30pm, please call main pharmacy at [phone]

## 2017-07-15 NOTE — Progress Notes (Signed)
Hospice and Palliative Care of  [facility])   Received request from [facility] for family interest in [facility] services at home after discharge. Chart and patient information have been reviewed. Eligiblity has been confirmed by [facility] physician.   Met with daughter Tod Persia at bedside while patient slept. Initiated education related to hospice philosophy, services and team approach to care with good understanding of information verbalized by daughter. Per [facility] plan is for patient to return home this afternoon by car.   DME needs discussed. Per daughter's request, 3N1, hospital bed and OBT have been ordered. Per RNCM oxygen is not needed. Patient already has walker and wheel chair at home. [facility] equipment has been notified and will arrange delivery of DME through [place]. Daughter is contact for [facility] to coordinate with. She is requesting that DME be delivered after patient returns home. Discharge address has been verified and is correct in Epic.   Please send home with patient scripts for any medication he does not already have including comfort medications.   [facility] referral center is aware of the above and will arrange for [facility] RN to visit at home after discharge. [facility] contact information given to daughter at bedside. Above information shared with RNCM.  Please call with Hospice related questions.   Thank you,  Erling Conte, LCSW [phone]  [place] liaisons are listed on AMION under Hospice and [place].

## 2017-07-16 MED FILL — TRUE METRIX TEST STRIP: 30 days supply | Qty: 100 | Fill #0

## 2017-07-17 ENCOUNTER — Encounter: Payer: Self-pay | Admitting: Physical Medicine & Rehabilitation

## 2017-07-17 ENCOUNTER — Other Ambulatory Visit: Payer: Self-pay | Admitting: Physical Medicine & Rehabilitation

## 2017-07-17 MED FILL — !TRUE METRIX BLOOD GLUCOSE: 30 days supply | Qty: 1 | Fill #0

## 2017-07-18 ENCOUNTER — Telehealth: Payer: Self-pay | Admitting: Cardiology

## 2017-07-18 ENCOUNTER — Encounter: Payer: Self-pay | Admitting: Cardiology

## 2017-07-18 ENCOUNTER — Telehealth: Payer: Self-pay

## 2017-07-18 ENCOUNTER — Telehealth: Payer: Self-pay | Admitting: Family Medicine

## 2017-07-18 MED ORDER — RIVAROXABAN 20 MG PO TABS: 20.0000 mg | ORAL_TABLET | Freq: Every day | ORAL | 3 refills | Status: AC

## 2017-07-18 NOTE — Telephone Encounter (Signed)
[facility](Hospice) notified she will call Dr Venetia NightAmao for orders

## 2017-07-18 NOTE — Telephone Encounter (Signed)
I spoke with [name] of hospice and palliative care regarding discontinuing Coumadin and commencing Xarelto as patient is in hospice. He will not need INRs to be drawn with Xarelto. I was informed to [facility] [address] which the patient has on file.

## 2017-07-18 NOTE — Telephone Encounter (Signed)
Devin SquibbJane Sanchez needs refill of Coumadin 7.5 mg 1qd at 6pm, I told her the ordering provider was Devin Dollaraniel Angiulli, [name], she states she can't take med order from a [name], and asking if [name] would order? Pls call [phone]

## 2017-07-18 NOTE — Telephone Encounter (Signed)
I do not manage this patient's warfarin. Will forward to PCP

## 2017-07-18 NOTE — Telephone Encounter (Signed)
Pt called to request a refill for  warfarin (COUMADIN) 7.5 MG tablet [facility] [address] please follow up

## 2017-07-18 NOTE — Telephone Encounter (Signed)
Switch to Xarelto; hospice and palliative care made aware -see previous note.

## 2017-07-18 NOTE — Telephone Encounter (Signed)
Call placed to [facility] # [phone]. Spoke to [place]Jenny regarding obtaining INRs in the home.  She explained that the have an INR point of care machine to use in home.  Results are called to the provider's office usually the same day the test was done  She noted that if needed, the INR can be done on the weekend. This CM explained that there is always a provider on call after hours and weekends to call with results.  Boneta LucksJenny stated that she would note that in the patient's medical record and she confirmed the phone # for [facility].

## 2017-07-18 NOTE — Telephone Encounter (Signed)
Please have pts primary care order Devin MillersBrian Crenshaw

## 2017-07-21 ENCOUNTER — Telehealth: Payer: Self-pay

## 2017-07-21 MED FILL — XARELTO 20 MG TABLET: 20 | 30 days supply | Qty: 30 | Fill #0

## 2017-07-21 NOTE — Telephone Encounter (Signed)
His [facility] or tomorrow latest. His last dose of Coumadin was yesterday; he does not need a refill on Coumadin and will not need INRs going forward.Thank you.

## 2017-07-21 NOTE — Telephone Encounter (Signed)
As per Violeta Gelinashristine Epanchin, [facility] Pharmacy Tech, the patient's family [facility].  There is  a coupon for the medication and the medication is ready for pick up.  She stated that his daughter will need to sign documents for the pharmacy assistance program in order for the patient to receive the medication free of charge after this prescription runs out.  Dr Venetia NightAmao notified that the xarelto is ready for pick up.  Call placed to [facility], [facility]. Informed her of the status of the xarelto. She stated that the patient did not take coumadin today and does not have any more coumadin at home. His last coumadin dose was yesterday. She said that they have not done any INRs since he has been with hospice on 07/15/17.  Informed her that as per Dr Venetia NightAmao, the patient does not need any refills for the coumadin.  His family is to pick [facility] the [facility] today or tomorrow at the latest. Erskine SquibbJane stated that his daughter will need to arrange transportation to take her to the clinic tomorrow. Erskine SquibbJane said that she would call the daughter and inform her of the need to pick up xarelto and to sign the paperwork for the pharmacy assistance program, he does not need a refill for the coumadin. Marland Kitchen.    Update provided to Dr Venetia NightAmao.

## 2017-07-21 NOTE — Telephone Encounter (Signed)
Call [facility] from [facility], [facility]. She said  that she spoke to the patient's daughter who is the primary caregiver and she ( daughter ) gave him coumadin 7.5 mg on 07/18/17, 07/19/17 and 07/20/17.  Erskine SquibbJane is still trying to determine who instructed the daughter to give him the coumadin.  Informed her that this CM is in process of trying to determine if the patient is eligible for the [facility] program.

## 2017-07-21 NOTE — Telephone Encounter (Signed)
Call received from Ras Kollman/nurse with [facility] - cell # [phone].    She stated that she has been trying to get through to [facility] for about 30 minutes this morning to discuss the order for xarelto. She explained that they received the order that the coumadin had been discontinued and the patient was to start on xarelto.  She then explained that hospice will not pay for the xarelto and the patient has no insurance so he is " now back on coumadin."  When asked who provided the order for coumadin, she said she did not know.  When asked if they tried to contact [facility] to inform the provider that hospice would not pay for the xarelto, she did not know.  Informed her that there is a [facility] provider on call 24/7.  She was not aware of any call made to inform [facility] of the situation. She was not sure how much coumadin the patient has taken - did he take it Saturday, 07/19/17 and/or Sunday, 07/20/17?  She explained that she was not clear on all of the details. Instructed her to clarify the details and call this CM back # [phone] if she is not able to get through on the [facility] main #.

## 2017-07-22 ENCOUNTER — Encounter (HOSPITAL_BASED_OUTPATIENT_CLINIC_OR_DEPARTMENT_OTHER): Payer: Self-pay | Attending: Surgery

## 2017-07-22 ENCOUNTER — Telehealth: Payer: Self-pay

## 2017-07-22 DIAGNOSIS — Z8673 Personal history of transient ischemic attack (TIA), and cerebral infarction without residual deficits: Secondary | ICD-10-CM | POA: Insufficient documentation

## 2017-07-22 DIAGNOSIS — Z86711 Personal history of pulmonary embolism: Secondary | ICD-10-CM | POA: Insufficient documentation

## 2017-07-22 DIAGNOSIS — L97512 Non-pressure chronic ulcer of other part of right foot with fat layer exposed: Secondary | ICD-10-CM | POA: Insufficient documentation

## 2017-07-22 DIAGNOSIS — I5023 Acute on chronic systolic (congestive) heart failure: Secondary | ICD-10-CM | POA: Insufficient documentation

## 2017-07-22 DIAGNOSIS — I110 Hypertensive heart disease with heart failure: Secondary | ICD-10-CM | POA: Insufficient documentation

## 2017-07-22 DIAGNOSIS — Z794 Long term (current) use of insulin: Secondary | ICD-10-CM | POA: Insufficient documentation

## 2017-07-22 DIAGNOSIS — I2510 Atherosclerotic heart disease of native coronary artery without angina pectoris: Secondary | ICD-10-CM | POA: Insufficient documentation

## 2017-07-22 DIAGNOSIS — E119 Type 2 diabetes mellitus without complications: Secondary | ICD-10-CM | POA: Insufficient documentation

## 2017-07-22 LAB — GLUCOSE, CAPILLARY: GLUCOSE-CAPILLARY: 210 mg/dL — AB (ref 65–99)

## 2017-07-22 NOTE — Telephone Encounter (Signed)
Call received from [place], [facility] stating that she spoke to the patient's daughter yesterday and instructed her to pick up the xarelto as discussed. She said that his daughter said she would be at the pharmacy in the morning ( today) to pick up the medication. Erskine SquibbJane verbalized understanding that INRs are not needed.   Erskine SquibbJane also provided the name and contact # for the Hospice supervisor of on call staff - Leighton Parodynn Marie Wilson # [phone],  to follow up, if needed,  regarding why [facility] was not informed that hospice would not pay for the xarelto when hospice was informed of that fact and the patient started taking coumadin again.

## 2017-07-22 NOTE — Telephone Encounter (Signed)
As per [name] , [name] Pharmacy tech, the patient's daughter has not yet picked up the xarelto.  Attempted to contact patient's daughter, [name], to remind her that the xarelto is ready for pick up.  Call placed to # [phone] and a HIPAA compliant voicemail message was left requesting a call back to # [phone].

## 2017-07-22 NOTE — Telephone Encounter (Signed)
As per Violeta Gelinashristine Epanchin, [facility] Tech, the patient's daughter just picked up the xarelto and signed the paperwork for the PASS program for future xarelto

## 2017-07-22 NOTE — Telephone Encounter (Signed)
Noted  

## 2017-07-24 ENCOUNTER — Telehealth: Payer: Self-pay

## 2017-07-24 NOTE — Telephone Encounter (Signed)
Thank you for taking the time to look into this.

## 2017-07-24 NOTE — Telephone Encounter (Addendum)
Chanel McKethan, Director, [facility] notified of the issue regarding xarelto and coumadin.  This CM to contact [facility] [place] to determine who provided orders for the patient to take coumadin after Dr Venetia NightAmao had ordered to discontinue coumadin and commence xarelto on 07/18/17.[facility] has a provider on call 24/7. [facility] was not notified that hospice would not pay for xarelto when hospice was informed of that fact.    Call placed to Leighton ParodyAnn Marie Wilson, RN/[facility] - Supervisor of On Call Staff # [phone] and a HIPAA compliant voicemail message was left requesting a call back to # [phone].

## 2017-07-24 NOTE — Telephone Encounter (Addendum)
Call received from Devin ParodyAnn Marie Sanchez, [facility] returning this CM call.  She explained that the nurse took the call from the pharmacy informing her that the xarelto would not be covered and the family was there to pick it up and didn't want to pay for it.  She stated that xarelto is not covered in hospice formulary. As per Ms [name], the nurse had not synchronized her computer at that time , so it was not updated with the current information, and she still saw the order for coumadin and instructed the family to have the patient continue taking coumadin. She did not see the order for xarelto and did not recognize that it was ordered until Monday, 07/21/17. Ms [name] was not sure exactly when the nurse synchronized her computer.  Ms [name]  noted that they are completing an incident report and the nurses have been counseled. This CM informed Ms [name] that [facility] has a provider on call 24/ 7 and any questions/concerns should be directed to the provider on call after hours.   An update has been provided to [name], [facility] Medical Director who determined no further action regarding this situation needed by [facility] at this time.  [name], Director/[facility] updated regarding the call from Ms [name] and [name]'s decision regarding no further action needed by [facility] at this time

## 2017-07-28 ENCOUNTER — Ambulatory Visit: Payer: Self-pay | Admitting: Cardiology

## 2017-07-29 NOTE — Progress Notes (Signed)
Established Intermittent Claudication   History of Present Illness   [name] is a 71 y.o. (November 27, 1945) male who presents with chief complaint: right foot wounds.  This patient underwent a TE R fem-pop, AT and TPT (04/25/17) for what IR reported as an acute occlusion related to a R femoral sheath for R MCA mechanical thrombectomy.  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  The patient's current sx are: one month history worsening wounds in R foot.  The patient denies any fever or chills or drainage.  He has pain in this R toes at this point.  The patient's treatment regimen currently included: maximal medical management.  Pt remains wheelchair dependent.  Past Medical History:  Diagnosis Date  . CAD (coronary artery disease) 2007   a. CABG 2007 in [place].  . Cerebrovascular disease   . Chronic systolic CHF (congestive heart failure) ([name])    a. EF 20-25% by echo in 03/2017 with NST showing prior infarct but no ischemia. Prior LVEF not known but patient reported history of weak heart.  . Diabetes mellitus without complication ([place])   . Hyperlipidemia   . Hypertension   . Lower extremity embolism ([place])    a. 04/2017 s/p thrombectomy of the right femoropopliteal artery, right below-the-knee popliteal artery endarterectomy with bovine patch angioplasty, and proximal endarterectomy of the right tibioperoneal trunk and peroneal artery..  . Pulmonary embolism ([place])   . PVC's (premature ventricular contractions)   . Stroke [facility])    a. ~2016, also 04/2017 with acute R MCA occlusion s/p thrombectomy.  . Thoracic aortic aneurysm ([place])    a. 4.2 vs 4.6cm by different CTs Aug/Sept 2018.    Past Surgical History:  Procedure Laterality Date  . CARDIAC SURGERY    . CYSTOSCOPY N/A 04/25/2017   Procedure: CYSTOSCOPY FLEXIBLE;  Surgeon: Conrad Greentree, MD;  Location: [place];  Service: Vascular;  Laterality: N/A;  . EMBOLECTOMY Right 04/25/2017   Procedure: EMBOLECTOMY RIGHT FEMORAL ARTERY;   Surgeon: Conrad Cottonwood, MD;  Location: [place];  Service: Vascular;  Laterality: Right;  . IR PERCUTANEOUS ART THROMBECTOMY/INFUSION INTRACRANIAL INC DIAG ANGIO  04/25/2017  . PATCH ANGIOPLASTY Right 04/25/2017   Procedure: PATCH ANGIOPLASTY USING [place];  Surgeon: Conrad Prue, MD;  Location: [place];  Service: Vascular;  Laterality: Right;  . PROSTATE SURGERY  2002  . RADIOLOGY WITH ANESTHESIA N/A 04/24/2017   Procedure: RADIOLOGY WITH ANESTHESIA Code Stroke;  Surgeon: Corrie Mckusick, DO;  Location: [place];  Service: Anesthesiology;  Laterality: N/A;    Social History   Socioeconomic History  . Marital status: Married    Spouse name: Not on file  . Number of children: Not on file  . Years of education: Not on file  . Highest education level: Not on file  Social Needs  . Financial resource strain: Not on file  . Food insecurity - worry: Not on file  . Food insecurity - inability: Not on file  . Transportation needs - medical: Not on file  . Transportation needs - non-medical: Not on file  Occupational History  . Not on file  Tobacco Use  . Smoking status: Never Smoker  . Smokeless tobacco: Never Used  Substance and Sexual Activity  . Alcohol use: No  . Drug use: No  . Sexual activity: Not on file  Other Topics Concern  . Not on file  Social History Narrative   Pt is from [place] and speaks [place]. He came  to the [place] in 02/2017.   He has a daughter and son-in-law that speak [place].     Family History  Problem Relation Age of Onset  . Hypertension Mother   . Hypertension Sister     Current Outpatient Medications  Medication Sig Dispense Refill  . acetaminophen (TYLENOL) 325 MG tablet Take 2 tablets (650 mg total) by mouth every 4 (four) hours as needed for headache or mild pain.    Marland Kitchen aspirin EC 81 MG EC tablet Take 1 tablet (81 mg total) by mouth daily.    Marland Kitchen atorvastatin (LIPITOR) 80 MG tablet Take 1 tablet (80 mg total) by mouth daily at 6 PM. 30 tablet 11  . Blood  Glucose Monitoring Suppl (TRUE METRIX METER) DEVI 1 kit by Does not apply route 4 (four) times daily. 1 Device 0  . digoxin (LANOXIN) 0.125 MG tablet Take 1 tablet (0.125 mg total) by mouth daily. 30 tablet 0  . feeding supplement, ENSURE ENLIVE, (ENSURE ENLIVE) LIQD Take 237 mLs by mouth 2 (two) times daily between meals. 237 mL 12  . furosemide (LASIX) 40 MG tablet Take 1 tablet (40 mg total) by mouth daily. 30 tablet 0  . glucose blood (TRUE METRIX BLOOD GLUCOSE TEST) test strip Use as instructed 100 each 12  . insulin glargine (LANTUS) 100 UNIT/ML injection Inject 0.05 mLs (5 Units total) into the skin at bedtime. 10 mL 11  . lactulose (CHRONULAC) 10 GM/15ML solution Take 15 mLs (10 g total) by mouth 3 (three) times daily. (Patient taking differently: Take 10 g 2 (two) times daily by mouth. ) 946 mL 1  . Multiple Vitamin (MULTIVITAMIN WITH MINERALS) TABS tablet Take 1 tablet by mouth daily. 30 tablet 0  . NOVOLOG 100 UNIT/ML injection Inject 5 Units into the skin at bedtime.  11  . pantoprazole (PROTONIX) 40 MG tablet Take 1 tablet (40 mg total) by mouth daily. 30 tablet 5  . QUEtiapine (SEROQUEL) 25 MG tablet Take 1 tablet (25 mg total) by mouth 2 (two) times daily. 60 tablet 0  . rivaroxaban (XARELTO) 20 MG TABS tablet Take 1 tablet (20 mg total) by mouth daily with supper. 30 tablet 3  . spironolactone (ALDACTONE) 12.5 mg TABS tablet Take 12.5 mg by mouth daily.    . traMADol (ULTRAM) 50 MG tablet Take 1 tablet (50 mg total) by mouth every 12 (twelve) hours as needed for severe pain. 10 tablet 0  . TRUEPLUS LANCETS 28G MISC 28 g by Does not apply route 4 (four) times daily. 120 each 11   No current facility-administered medications for this visit.      No Known Allergies  REVIEW OF SYSTEMS (negative unless checked):   Cardiac:  _0  Chest pain or chest pressure? _1  Shortness of breath upon activity? _2  Shortness of breath when lying flat? _3  Irregular heart rhythm?  Vascular:  _4   Pain in calf, thigh, or hip brought on by walking? _5  Pain in feet at night that wakes you up from your sleep? _6  Blood clot in your veins? _7  Leg swelling?  Pulmonary:  _8  Oxygen at home? _9  Productive cough? _10  Wheezing?  Neurologic:  _11  Sudden weakness in arms or legs? _12  Sudden numbness in arms or legs? _13  Sudden onset of difficult speaking or slurred speech? _14  Temporary loss of vision in one eye? _15  Problems with dizziness?  Gastrointestinal:  _16  Blood in stool? _17  Vomited blood?  Genitourinary:  _18  Burning when urinating? _19  Blood in urine?  Psychiatric:  _20   Major depression  Hematologic:  _0  Bleeding problems? _1  Problems with blood clotting?  Dermatologic:  _2  Rashes or ulcers?  Constitutional:  _3  Fever or chills?  Ear/Nose/Throat:  _4  Change in hearing? _5  Nose bleeds? _6  Sore throat?  Musculoskeletal:  _7  Back pain? _8  Joint pain? _9  Muscle pain?   Physical Examination   Vitals:   08/01/17 1358  BP: 107/73  Pulse: (!) 118  Resp: 20  Temp: (!) 97.3 F (36.3 C)  TempSrc: Oral  SpO2: 100%  Weight: 162 lb 11.2 oz (73.8 kg)  Height: _10  (1.778 m)   Body mass index is 23.35 kg/m.  General Alert, O x 3, WD, NAD  Pulmonary Sym exp, good B air movt, CTA B  Cardiac RRR, Nl S1, S2, no Murmurs, No rubs, No S3,S4  Vascular Vessel Right Left  Radial Palpable Palpable  Brachial Palpable Palpable  Carotid Palpable, No Bruit Palpable, No Bruit  Aorta Not palpable N/A  Femoral Palpable Palpable  Popliteal Not palpable Not palpable  PT Not palpable Not palpable  DP Not palpable Not palpable    Gastro- intestinal soft, non-distended, non-tender to palpation, No guarding or rebound, no HSM, no masses, no CVAT B, No palpable prominent aortic pulse,    Musculo- skeletal M/S 5/5 BUE, LLE 4-5/5, RLE 1-2/5,  L foot appears viable, R foot with ischemic R 3rd toe with some ischemic skin on 4th toe tip, base of toes 3-5 appear ischemic and TTP,     Neurologic Pain and light touch intact in extremities, Motor exam as listed above    Non-Invasive Vascular imaging   ABI (08/01/2017)  R:   ABI: none,   PT: none  DP: none  L:   ABI: 1.45 (likely calcified) ,   PT: mono  DP: bi  RLE Arterial Duplex (08/01/2017)  R CFA: 556 c/s PSV   PFA mono  SFA mono  Pop occluded   Medical Decision Making   Zackry Deines is a 71 y.o. male who presents with:  Progression of chronic R PAD   Given the language barrier, it is hard to determine the exact chronology of this patient's R leg ischemia.  Based on the patient's vascular studies and examination, I have offered the patient: aortogram, right leg runoff.  This is scheduled with Dr. Donzetta Matters on 17 DEC 18  Would admit the patient post-op:  Minimally, I expect he needs a fem-tib bypass.  It's not clear to me if the R CFA findings today are truly acute changes.  I would reconsult Palliative Care while admitted, as the EMR suggested there was a serious conversation about limiting care previously  BLE GSV mapping  I discussed in depth with the patient the nature of atherosclerosis, and emphasized the importance of maximal medical management including strict control of blood pressure, blood glucose, and lipid levels, antiplatelet agents, obtaining regular exercise, and cessation of smoking.    The patient is aware that without maximal medical management the underlying atherosclerotic disease process will progress, limiting the benefit of any interventions. The patient is currently on a statin: Lipitor.  The patient is currently on an anti-platelet: ASA.  Thank you for allowing [place] to participate in this patient's care.   Adele Barthel, MD, FACS [facility] [place] Office: [phone] Pager: [phone]

## 2017-07-30 ENCOUNTER — Telehealth: Payer: Self-pay | Admitting: Family Medicine

## 2017-07-30 MED FILL — ?PANTOPRAZOLE SOD DR 40MG: 40 MG | 30 days supply | Qty: 30 | Fill #2 | Status: TO

## 2017-07-30 NOTE — Telephone Encounter (Signed)
Will route to PCP 

## 2017-07-30 NOTE — Telephone Encounter (Signed)
Jane form Hospice called to let Pt. PCP know that pt. Toe still hurts and that he stated taking Vicodin. Rep. Also stated that she is concerned about pt. DM and would like for PCP to discuss pt. Insulin on his next appt.

## 2017-07-31 NOTE — Telephone Encounter (Signed)
Hospice was called and a message was left informing [name] to contact me(Ranyah Groeneveld) as soon as she can.

## 2017-08-01 ENCOUNTER — Ambulatory Visit (HOSPITAL_COMMUNITY)
Admission: RE | Admit: 2017-08-01 | Discharge: 2017-08-01 | Disposition: A | Discharge status: Home / Self Care | Payer: No Typology Code available for payment source | Source: Ambulatory Visit | Attending: Vascular Surgery | Admitting: Vascular Surgery

## 2017-08-01 ENCOUNTER — Ambulatory Visit (INDEPENDENT_AMBULATORY_CARE_PROVIDER_SITE_OTHER): Payer: No Typology Code available for payment source | Admitting: Vascular Surgery

## 2017-08-01 ENCOUNTER — Telehealth: Payer: Self-pay

## 2017-08-01 ENCOUNTER — Encounter: Payer: Self-pay | Admitting: Vascular Surgery

## 2017-08-01 ENCOUNTER — Encounter: Payer: Self-pay | Admitting: *Deleted

## 2017-08-01 ENCOUNTER — Other Ambulatory Visit: Payer: Self-pay | Admitting: *Deleted

## 2017-08-01 ENCOUNTER — Other Ambulatory Visit: Payer: Self-pay | Admitting: Vascular Surgery

## 2017-08-01 ENCOUNTER — Ambulatory Visit (INDEPENDENT_AMBULATORY_CARE_PROVIDER_SITE_OTHER)
Admission: RE | Admit: 2017-08-01 | Discharge: 2017-08-01 | Disposition: A | Discharge status: Home / Self Care | Payer: No Typology Code available for payment source | Source: Ambulatory Visit | Attending: Vascular Surgery | Admitting: Vascular Surgery

## 2017-08-01 VITALS — BP 107/73 | HR 118 | Temp 97.3°F | Resp 20 | Ht 70.0 in | Wt 162.7 lb

## 2017-08-01 DIAGNOSIS — I743 Embolism and thrombosis of arteries of the lower extremities: Secondary | ICD-10-CM | POA: Insufficient documentation

## 2017-08-01 DIAGNOSIS — I70201 Unspecified atherosclerosis of native arteries of extremities, right leg: Secondary | ICD-10-CM | POA: Insufficient documentation

## 2017-08-01 DIAGNOSIS — Z789 Other specified health status: Secondary | ICD-10-CM

## 2017-08-01 DIAGNOSIS — I739 Peripheral vascular disease, unspecified: Secondary | ICD-10-CM

## 2017-08-01 DIAGNOSIS — I998 Other disorder of circulatory system: Secondary | ICD-10-CM

## 2017-08-01 DIAGNOSIS — I70229 Atherosclerosis of native arteries of extremities with rest pain, unspecified extremity: Secondary | ICD-10-CM

## 2017-08-01 NOTE — Telephone Encounter (Addendum)
As per Violeta Gelinashristine Epanchin, [facility], the PASS program would not approve the patient for xarelto noting that since he is receiving hospice care, hospice should pay for it even though xarelto is not in the hospice formulary. She did note that the [facility] would provide the medication for the patient for $10/refill and would work with the patient and family if they are not able to afford the $10.   Call placed to the patient's hospice nurse, Erskine SquibbJane # [phone] and informed her of the above note regarding xarelto. Also informed her that this CM would inform the patient's daughter, Chauncey MannSalimata. She noted that hospice will not pay anything towards the cost of the xarelto,   Call placed to the patient's daughter, Chauncey MannSalimata and informed her of the above note regarding the PASS program. She stated that paying $10/refill would not be a problem. Informed her that the [facility] will work with her Meryle Ready/family if they are not able to afford $10, they just need to let us know if they need assistance.  She verbalized understanding and stated she was very appreciative of the help and glad there her father did not have to have the fingersticks for INR.    Update provided to Dr Venetia NightAmao.

## 2017-08-01 NOTE — Telephone Encounter (Signed)
Erskine SquibbJane called and states that patient blood sugars in the morning has been 80. Just a FYI.

## 2017-08-01 NOTE — Telephone Encounter (Signed)
Noted  

## 2017-08-03 ENCOUNTER — Ambulatory Visit: Payer: Self-pay | Admitting: Pharmacist Clinician (PhC)/ Clinical Pharmacy Specialist

## 2017-08-03 DIAGNOSIS — Z7901 Long term (current) use of anticoagulants: Secondary | ICD-10-CM

## 2017-08-03 DIAGNOSIS — I63411 Cerebral infarction due to embolism of right middle cerebral artery: Secondary | ICD-10-CM

## 2017-08-04 ENCOUNTER — Encounter (HOSPITAL_COMMUNITY)
Admission: RE | Disposition: A | Discharge status: Hospice | Payer: Self-pay | Source: Ambulatory Visit | Attending: Vascular Surgery

## 2017-08-04 ENCOUNTER — Observation Stay (HOSPITAL_COMMUNITY)
Admission: RE | Admit: 2017-08-04 | Discharge: 2017-08-05 | Disposition: A | Discharge status: Hospice | Payer: Medicaid Other | Source: Ambulatory Visit | Attending: Vascular Surgery | Admitting: Vascular Surgery

## 2017-08-04 DIAGNOSIS — Z794 Long term (current) use of insulin: Secondary | ICD-10-CM | POA: Diagnosis not present

## 2017-08-04 DIAGNOSIS — Z86711 Personal history of pulmonary embolism: Secondary | ICD-10-CM | POA: Diagnosis not present

## 2017-08-04 DIAGNOSIS — I712 Thoracic aortic aneurysm, without rupture: Secondary | ICD-10-CM | POA: Diagnosis not present

## 2017-08-04 DIAGNOSIS — I5022 Chronic systolic (congestive) heart failure: Secondary | ICD-10-CM | POA: Diagnosis not present

## 2017-08-04 DIAGNOSIS — I998 Other disorder of circulatory system: Secondary | ICD-10-CM

## 2017-08-04 DIAGNOSIS — E1151 Type 2 diabetes mellitus with diabetic peripheral angiopathy without gangrene: Secondary | ICD-10-CM | POA: Diagnosis not present

## 2017-08-04 DIAGNOSIS — I679 Cerebrovascular disease, unspecified: Secondary | ICD-10-CM | POA: Insufficient documentation

## 2017-08-04 DIAGNOSIS — I110 Hypertensive heart disease with heart failure: Secondary | ICD-10-CM | POA: Insufficient documentation

## 2017-08-04 DIAGNOSIS — Z8673 Personal history of transient ischemic attack (TIA), and cerebral infarction without residual deficits: Secondary | ICD-10-CM | POA: Insufficient documentation

## 2017-08-04 DIAGNOSIS — Z86718 Personal history of other venous thrombosis and embolism: Secondary | ICD-10-CM | POA: Diagnosis not present

## 2017-08-04 DIAGNOSIS — Z79899 Other long term (current) drug therapy: Secondary | ICD-10-CM | POA: Diagnosis not present

## 2017-08-04 DIAGNOSIS — Z7901 Long term (current) use of anticoagulants: Secondary | ICD-10-CM | POA: Diagnosis not present

## 2017-08-04 DIAGNOSIS — I2510 Atherosclerotic heart disease of native coronary artery without angina pectoris: Secondary | ICD-10-CM | POA: Diagnosis not present

## 2017-08-04 DIAGNOSIS — I739 Peripheral vascular disease, unspecified: Secondary | ICD-10-CM | POA: Diagnosis present

## 2017-08-04 DIAGNOSIS — Z7982 Long term (current) use of aspirin: Secondary | ICD-10-CM | POA: Diagnosis not present

## 2017-08-04 DIAGNOSIS — L97519 Non-pressure chronic ulcer of other part of right foot with unspecified severity: Secondary | ICD-10-CM | POA: Insufficient documentation

## 2017-08-04 DIAGNOSIS — I70235 Atherosclerosis of native arteries of right leg with ulceration of other part of foot: Secondary | ICD-10-CM | POA: Diagnosis not present

## 2017-08-04 DIAGNOSIS — Z951 Presence of aortocoronary bypass graft: Secondary | ICD-10-CM | POA: Diagnosis not present

## 2017-08-04 DIAGNOSIS — E785 Hyperlipidemia, unspecified: Secondary | ICD-10-CM | POA: Insufficient documentation

## 2017-08-04 DIAGNOSIS — Z515 Encounter for palliative care: Secondary | ICD-10-CM | POA: Diagnosis not present

## 2017-08-04 DIAGNOSIS — Z79891 Long term (current) use of opiate analgesic: Secondary | ICD-10-CM | POA: Insufficient documentation

## 2017-08-04 DIAGNOSIS — Z993 Dependence on wheelchair: Secondary | ICD-10-CM | POA: Diagnosis not present

## 2017-08-04 DIAGNOSIS — I70269 Atherosclerosis of native arteries of extremities with gangrene, unspecified extremity: Secondary | ICD-10-CM | POA: Diagnosis present

## 2017-08-04 HISTORY — PX: ABDOMINAL AORTOGRAM: CATH118222

## 2017-08-04 HISTORY — PX: LOWER EXTREMITY ANGIOGRAPHY: CATH118251

## 2017-08-04 LAB — GLUCOSE, CAPILLARY
GLUCOSE-CAPILLARY: 146 mg/dL — AB (ref 65–99)
Glucose-Capillary: 107 mg/dL — ABNORMAL HIGH (ref 65–99)
Glucose-Capillary: 122 mg/dL — ABNORMAL HIGH (ref 65–99)

## 2017-08-04 LAB — POCT I-STAT, CHEM 8
BUN: 24 mg/dL — AB (ref 6–20)
CALCIUM ION: 1.19 mmol/L (ref 1.15–1.40)
CREATININE: 0.9 mg/dL (ref 0.61–1.24)
Chloride: 103 mmol/L (ref 101–111)
GLUCOSE: 116 mg/dL — AB (ref 65–99)
HEMATOCRIT: 41 % (ref 39.0–52.0)
HEMOGLOBIN: 13.9 g/dL (ref 13.0–17.0)
Potassium: 3.8 mmol/L (ref 3.5–5.1)
Sodium: 141 mmol/L (ref 135–145)
TCO2: 25 mmol/L (ref 22–32)

## 2017-08-04 LAB — CBC
HCT: 36.7 % — ABNORMAL LOW (ref 39.0–52.0)
HEMOGLOBIN: 13 g/dL (ref 13.0–17.0)
MCH: 24.8 pg — ABNORMAL LOW (ref 26.0–34.0)
MCHC: 35.4 g/dL (ref 30.0–36.0)
MCV: 69.9 fL — ABNORMAL LOW (ref 78.0–100.0)
PLATELETS: 261 10*3/uL (ref 150–400)
RBC: 5.25 MIL/uL (ref 4.22–5.81)
RDW: 15.6 % — ABNORMAL HIGH (ref 11.5–15.5)
WBC: 7.2 10*3/uL (ref 4.0–10.5)

## 2017-08-04 LAB — CREATININE, SERUM
CREATININE: 1.01 mg/dL (ref 0.61–1.24)
GFR calc non Af Amer: >60 mL/min (ref >60)

## 2017-08-04 SURGERY — ABDOMINAL AORTOGRAM
Anesthesia: LOCAL

## 2017-08-04 MED ORDER — SALINE SPRAY 0.65 % NA SOLN
1.0000 | NASAL | Status: DC | PRN
  Filled 2017-08-04: qty 44

## 2017-08-04 MED ORDER — SODIUM CHLORIDE 0.9% FLUSH: 3.0000 mL | INTRAVENOUS | Status: DC | PRN

## 2017-08-04 MED ORDER — LIDOCAINE HCL (PF) 1 % IJ SOLN
INTRAMUSCULAR | Status: AC
  Filled 2017-08-04: qty 30

## 2017-08-04 MED ORDER — GLUCERNA 1.2 CAL PO LIQD
237.0000 mL | Freq: Two times a day (BID) | ORAL | Status: DC
  Administered 2017-08-04: 237 mL via ORAL
  Filled 2017-08-04 (×4): qty 237

## 2017-08-04 MED ORDER — SODIUM CHLORIDE 0.9% FLUSH
3.0000 mL | Freq: Two times a day (BID) | INTRAVENOUS | Status: DC
  Administered 2017-08-05 (×2): 3 mL via INTRAVENOUS

## 2017-08-04 MED ORDER — ADULT MULTIVITAMIN W/MINERALS CH
1.0000 | ORAL_TABLET | Freq: Every day | ORAL | Status: DC
  Administered 2017-08-05: 1 via ORAL
  Filled 2017-08-04: qty 1

## 2017-08-04 MED ORDER — SODIUM CHLORIDE 0.9 % IV SOLN
INTRAVENOUS | Status: DC
  Administered 2017-08-04: 14:00:00 via INTRAVENOUS

## 2017-08-04 MED ORDER — INSULIN ASPART 100 UNIT/ML ~~LOC~~ SOLN
5.0000 [IU] | Freq: Every day | SUBCUTANEOUS | Status: DC
  Administered 2017-08-04: 5 [IU] via SUBCUTANEOUS

## 2017-08-04 MED ORDER — HEPARIN (PORCINE) IN NACL 2-0.9 UNIT/ML-% IJ SOLN
INTRAMUSCULAR | Status: AC | PRN
  Administered 2017-08-04: 1000 mL

## 2017-08-04 MED ORDER — SODIUM CHLORIDE 0.9 % IV SOLN: 250.0000 mL | INTRAVENOUS | Status: DC | PRN

## 2017-08-04 MED ORDER — SODIUM CHLORIDE 0.9 % WEIGHT BASED INFUSION: 1.0000 mL/kg/h | INTRAVENOUS | Status: AC

## 2017-08-04 MED ORDER — ENSURE ENLIVE PO LIQD
237.0000 mL | Freq: Two times a day (BID) | ORAL | Status: DC
  Administered 2017-08-05: 237 mL via ORAL

## 2017-08-04 MED ORDER — ONDANSETRON HCL 4 MG/2ML IJ SOLN: 4.0000 mg | Freq: Four times a day (QID) | INTRAMUSCULAR | Status: DC | PRN

## 2017-08-04 MED ORDER — OXYCODONE HCL 5 MG PO TABS: 5.0000 mg | ORAL_TABLET | ORAL | Status: DC | PRN

## 2017-08-04 MED ORDER — HEPARIN SODIUM (PORCINE) 5000 UNIT/ML IJ SOLN
5000.0000 [IU] | Freq: Three times a day (TID) | INTRAMUSCULAR | Status: DC
  Administered 2017-08-04 – 2017-08-05 (×2): 5000 [IU] via SUBCUTANEOUS
  Filled 2017-08-04 (×2): qty 1

## 2017-08-04 MED ORDER — ACETAMINOPHEN 325 MG PO TABS: 650.0000 mg | ORAL_TABLET | ORAL | Status: DC | PRN

## 2017-08-04 MED ORDER — SENNA 8.6 MG PO TABS
2.0000 | ORAL_TABLET | Freq: Two times a day (BID) | ORAL | Status: DC
  Administered 2017-08-04 – 2017-08-05 (×2): 17.2 mg via ORAL
  Filled 2017-08-04 (×2): qty 2

## 2017-08-04 MED ORDER — QUETIAPINE FUMARATE 25 MG PO TABS
25.0000 mg | ORAL_TABLET | Freq: Two times a day (BID) | ORAL | Status: DC
  Administered 2017-08-04 – 2017-08-05 (×2): 25 mg via ORAL
  Filled 2017-08-04 (×2): qty 1

## 2017-08-04 MED ORDER — LIDOCAINE HCL (PF) 1 % IJ SOLN
INTRAMUSCULAR | Status: DC | PRN
  Administered 2017-08-04: 18 mL

## 2017-08-04 MED ORDER — INSULIN GLARGINE 100 UNIT/ML ~~LOC~~ SOLN
5.0000 [IU] | Freq: Every day | SUBCUTANEOUS | Status: DC
  Administered 2017-08-04: 5 [IU] via SUBCUTANEOUS
  Filled 2017-08-04 (×2): qty 0.05

## 2017-08-04 MED ORDER — PANTOPRAZOLE SODIUM 40 MG PO TBEC
40.0000 mg | DELAYED_RELEASE_TABLET | Freq: Every day | ORAL | Status: DC
  Administered 2017-08-04 – 2017-08-05 (×2): 40 mg via ORAL
  Filled 2017-08-04 (×2): qty 1

## 2017-08-04 MED ORDER — TRAMADOL HCL 50 MG PO TABS: 50.0000 mg | ORAL_TABLET | Freq: Two times a day (BID) | ORAL | Status: DC | PRN

## 2017-08-04 MED ORDER — TRUE METRIX METER DEVI: 1.0000 | Freq: Four times a day (QID) | Status: DC

## 2017-08-04 MED ORDER — FUROSEMIDE 40 MG PO TABS
40.0000 mg | ORAL_TABLET | Freq: Every day | ORAL | Status: DC
  Administered 2017-08-04 – 2017-08-05 (×2): 40 mg via ORAL
  Filled 2017-08-04 (×2): qty 1

## 2017-08-04 MED ORDER — HEPARIN (PORCINE) IN NACL 2-0.9 UNIT/ML-% IJ SOLN
INTRAMUSCULAR | Status: AC
  Filled 2017-08-04: qty 1000

## 2017-08-04 MED ORDER — SPIRONOLACTONE 12.5 MG HALF TABLET
12.5000 mg | ORAL_TABLET | Freq: Every day | ORAL | Status: DC
  Administered 2017-08-04 – 2017-08-05 (×2): 12.5 mg via ORAL
  Filled 2017-08-04 (×3): qty 1

## 2017-08-04 MED ORDER — LABETALOL HCL 5 MG/ML IV SOLN: 10.0000 mg | INTRAVENOUS | Status: DC | PRN

## 2017-08-04 MED ORDER — HYDRALAZINE HCL 20 MG/ML IJ SOLN: 5.0000 mg | INTRAMUSCULAR | Status: DC | PRN

## 2017-08-04 MED ORDER — ATORVASTATIN CALCIUM 80 MG PO TABS: 80.0000 mg | ORAL_TABLET | Freq: Every day | ORAL | Status: DC

## 2017-08-04 MED ORDER — TRUEPLUS LANCETS 28G MISC: 28.0000 g | Freq: Four times a day (QID) | Status: DC

## 2017-08-04 MED ORDER — HYDROCODONE-ACETAMINOPHEN 5-325 MG PO TABS: 1.0000 | ORAL_TABLET | Freq: Four times a day (QID) | ORAL | Status: DC | PRN

## 2017-08-04 MED ORDER — ASPIRIN 81 MG PO CHEW
81.0000 mg | CHEWABLE_TABLET | Freq: Every day | ORAL | Status: DC
  Administered 2017-08-04 – 2017-08-05 (×2): 81 mg via ORAL
  Filled 2017-08-04 (×2): qty 1

## 2017-08-04 MED ORDER — DIGOXIN 125 MCG PO TABS
0.1250 mg | ORAL_TABLET | Freq: Every day | ORAL | Status: DC
  Administered 2017-08-05: 0.125 mg via ORAL
  Filled 2017-08-04: qty 1

## 2017-08-04 MED ORDER — IODIXANOL 320 MG/ML IV SOLN
INTRAVENOUS | Status: DC | PRN
  Administered 2017-08-04: 140 mL via INTRAVENOUS

## 2017-08-04 SURGICAL SUPPLY — 9 items
CATH OMNI FLUSH 5F 65CM (CATHETERS) ×3 IMPLANT
COVER PRB 48X5XTLSCP FOLD TPE (BAG) ×2 IMPLANT
COVER PROBE 5X48 (BAG) ×1
KIT PV (KITS) ×3 IMPLANT
SHEATH PINNACLE 5F 10CM (SHEATH) ×3 IMPLANT
SYR MEDRAD MARK V 150ML (SYRINGE) ×3 IMPLANT
TRANSDUCER W/STOPCOCK (MISCELLANEOUS) ×3 IMPLANT
TRAY PV CATH (CUSTOM PROCEDURE TRAY) ×3 IMPLANT
WIRE BENTSON .035X145CM (WIRE) ×3 IMPLANT

## 2017-08-04 NOTE — Plan of Care (Signed)
  Pain Managment: General experience of comfort will improve 08/04/2017 2354 - Progressing by Reinaldo BerberAguirre, Patria Warzecha C, RN

## 2017-08-04 NOTE — Progress Notes (Addendum)
Patient arrived on unit from cath lab, dorsalis pedis and popliteal pulses audible by Doppler bilateral. Patient placed on bedrest until 2145.  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  VS stable, patient comfortable, bed in lowest position, and call bell within reach.

## 2017-08-04 NOTE — Progress Notes (Addendum)
Site area: LFA Site Prior to Removal:  Level 0 Pressure Applied For: 20 min Manual:   yes Patient Status During Pull:  stable Post Pull Site:  Level 0 Post Pull Instructions Given:  Yes-through interpreter Post Pull Pulses Present: doppler Dressing Applied:  tegaderm Bedrest begins @ 1745 till 2145 Comments:

## 2017-08-04 NOTE — Op Note (Signed)
    Patient name: [name] MRN: [id number] DOB: 1946/05/27 Sex: male  08/04/2017 Pre-operative Diagnosis: critical right lower extremity ischemia Post-operative diagnosis:  Same Surgeon:  Apolinar JunesBrandon C. Randie Heinzain, MD Procedure Performed: 1.  US guided cannulation of left common femoral artery 2.  Aortogram with bilateral lower extremity runoff  Indications: 71 year old male history of right lower extremity femoral embolectomy.  He now has ulceration on his right foot and is indicated for angiogram possible intervention.  Findings: The aorta and iliac segments are free of flow-limiting stenosis.  The right common femoral artery appears to have a greater than 50% flow-limiting stenosis proximally.  No angulated shots were taken of this.  He has run off bilaterally and his SFAs to the level of the popliteals and on the right the popliteal occludes.  On the left he has in-line flow via the peroneal although it does have a short segment occlusion as well.  On the right he has no discernible tibials until very distally where he has a peroneal and anterior tibial artery.  He will need to be considered for #1 right common femoral endarterectomy with common femoral to very distal tibial artery bypass.  #2 primary above-knee amputation.  #3 continued wound care.    Procedure:  The patient was identified in the holding area and taken to room 8.  The patient was then placed supine on the table and prepped and draped in the usual sterile fashion.  A time out was called.  Ultrasound was used to evaluate the left common femoral artery.  It was patent .  A digital ultrasound image was acquired.  A micropuncture needle was used to access the left common femoral artery under ultrasound guidance.  An 018 wire was advanced without resistance and a micropuncture sheath was placed.  The 018 wire was removed and a benson wire was placed.  The micropuncture sheath was exchanged for a 5 french sheath.  An omniflush catheter was  advanced over the wire to the level of L-1.  An abdominal angiogram was obtained followed by bilateral lower extremity runoff.  We then crossed the bifurcation with Omni catheter and Bentson wire.  Additional angiogram of the right lower extremity was performed.  With the above findings we elected to terminate the procedure.  Patient tolerated procedure well without immediate complication.  Contrast: 140cc  Shaw Dobek C. Randie Heinzain, MD [facility] Office: [phone] Pager: [phone]

## 2017-08-04 NOTE — Telephone Encounter (Signed)
Noted  

## 2017-08-04 NOTE — H&P (Signed)
   History and Physical Update  The patient was interviewed and re-examined.  The patient's previous History and Physical has been reviewed and is unchanged from Dr. Imogene Burnhen office note. Plan for aortogram and possible intervention on right.   Derra Shartzer C. Randie Heinzain, MD [facility] [place] Office: [phone] Pager: [phone]   08/04/2017, 3:24 PM

## 2017-08-05 ENCOUNTER — Encounter (HOSPITAL_COMMUNITY): Payer: No Typology Code available for payment source

## 2017-08-05 ENCOUNTER — Encounter (HOSPITAL_COMMUNITY): Payer: Self-pay | Admitting: Vascular Surgery

## 2017-08-05 DIAGNOSIS — I70235 Atherosclerosis of native arteries of right leg with ulceration of other part of foot: Secondary | ICD-10-CM | POA: Diagnosis not present

## 2017-08-05 LAB — BASIC METABOLIC PANEL
ANION GAP: 10 (ref 5–15)
BUN: 24 mg/dL — ABNORMAL HIGH (ref 6–20)
CHLORIDE: 106 mmol/L (ref 101–111)
CO2: 24 mmol/L (ref 22–32)
Calcium: 9.3 mg/dL (ref 8.9–10.3)
Creatinine, Ser: 0.93 mg/dL (ref 0.61–1.24)
GFR calc Af Amer: >60 mL/min (ref >60)
GFR calc non Af Amer: >60 mL/min (ref >60)
GLUCOSE: 162 mg/dL — AB (ref 65–99)
POTASSIUM: 3.7 mmol/L (ref 3.5–5.1)
Sodium: 140 mmol/L (ref 135–145)

## 2017-08-05 LAB — GLUCOSE, CAPILLARY
Glucose-Capillary: 135 mg/dL — ABNORMAL HIGH (ref 65–99)
Glucose-Capillary: 177 mg/dL — ABNORMAL HIGH (ref 65–99)
Glucose-Capillary: 192 mg/dL — ABNORMAL HIGH (ref 65–99)

## 2017-08-05 LAB — CBC
HEMATOCRIT: 34.2 % — AB (ref 39.0–52.0)
HEMOGLOBIN: 11.9 g/dL — AB (ref 13.0–17.0)
MCH: 24.4 pg — AB (ref 26.0–34.0)
MCHC: 34.8 g/dL (ref 30.0–36.0)
MCV: 70.1 fL — AB (ref 78.0–100.0)
Platelets: 230 10*3/uL (ref 150–400)
RBC: 4.88 MIL/uL (ref 4.22–5.81)
RDW: 16.1 % — ABNORMAL HIGH (ref 11.5–15.5)
WBC: 7.2 10*3/uL (ref 4.0–10.5)

## 2017-08-05 MED ORDER — GLUCERNA SHAKE PO LIQD
237.0000 mL | Freq: Two times a day (BID) | ORAL | Status: DC
  Administered 2017-08-05: 237 mL via ORAL

## 2017-08-05 NOTE — Progress Notes (Signed)
Order received to discharge patient.  Telemetry monitor removed and CCMD notified.  PIV access removed.  Discharge instructions, follow up, medications and instructions for their use discussed with patient and family. 

## 2017-08-05 NOTE — Progress Notes (Addendum)
Palliative Medicine consult noted. Due to high referral volume, there may be a delay seeing this patient.   It appears that the patient is active with [facility]. I have spoken with their rep Carley Hammedva for clarification. I will continue to f/u.  Margret ChanceMelanie G. Shaketta Rill, RN, BSN, [facility] 08/05/2017 8:39 AM Cell [phone] 8:00-4:00 Monday-Friday Office [phone]  ADDENDUM: This is an active patient with [facility], and [facility] was not notified of his admission to the hospital. [facility] follows their patients while admitted and continues goals of care discussions. PMT will sign off for now, as they will follow. Please re-consult if new unmet palliative needs arise.  Margret ChanceMelanie G. Marco Raper, RN, BSN, [facility] 08/05/2017 8:50 AM Cell [phone] 8:00-4:00 Monday-Friday Office [phone]

## 2017-08-05 NOTE — Progress Notes (Signed)
[facility] 6E-06 [facility] GIP RN Visit at 10:30 AM  This is a related and non-covered GIP admission of 08/04/17 with [facility] diagnosis of Congestive Heart Failure, per Dr. Barbee ShropshireHertweck.  Patient is a FULL CODE.  Patient/family did not notify hospice of hospital admission.  Patient was admitted for right lower extremity pain.    Patient seen in room, with daughter Chauncey MannSalimata at bedside.  Patient denies pain at this time.  Breakfast tray at bedside, which is untouched.  Patient has not required pain medication today.  Per chart review, patient had an US guided cannulation of left common femoral artery yesterday.  Per discussion with daughter, the surgeon discussed major bypass surgery to the right leg or an above-knee amputation.  Daughter stated that the family is concerned about the patient surviving surgery and would like to take him home and manage with wound care and pain medication at this time. Daughter stated she would transport patient home via private vehicle.  Eleanora NeighborUpdated Maureen, surgical PA and hospice SW re: family wishes.    Updated [facility] medication list and transfer summary placed on chart.  Please contact with any hospice-related questions or concerns.  Thank you, Hessie KnowsStacie Wilkinson RN, BSN [facility] Hospital Liaison [phone]  All [facility] hospital liaisons now live on AMION.

## 2017-08-05 NOTE — Care Management Note (Signed)
Case Management Note Donn PieriniKristi Romir Klimowicz RN, BSN Unit 4E-Case Manager [phone]  Patient Details  Name: [name] MRN: [id number] Date of Birth: 02-20-1946  Subjective/Objective:  Pt admitted with PAD, HCAP                  Action/Plan: PTA pt lived at home with family- pt speaks [place]- active with [facility] for home hospice services- - CM to follow for transition needs- anticipate return home with hospice services.   Expected Discharge Date:                  Expected Discharge Plan:  Home w Hospice Care  In-House Referral:  Clinical Social Work  Discharge planning Services  CM Consult  Post Acute Care Choice:  Hospice Choice offered to:     DME Arranged:    DME Agency[facility]  [facility]  [facility]  [facility]  Status of Service:  In process, will continue to follow  If discussed at Long Length of Stay Meetings, dates discussed:    Discharge Disposition:   Additional Comments:  Darrold SpanWebster, Anarie Kalish Hall, RN 08/05/2017, 10:50 AM

## 2017-08-05 NOTE — Progress Notes (Signed)
  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  CBC    Component Value Date/Time   WBC 7.2 08/05/2017 0416   RBC 4.88 08/05/2017 0416   HGB 11.9 (L) 08/05/2017 0416   HCT 34.2 (L) 08/05/2017 0416   PLT 230 08/05/2017 0416   MCV 70.1 (L) 08/05/2017 0416   MCH 24.4 (L) 08/05/2017 0416   MCHC 34.8 08/05/2017 0416   RDW 16.1 (H) 08/05/2017 0416   LYMPHSABS 2.1 07/07/2017 0444   MONOABS 0.8 07/07/2017 0444   EOSABS 0.2 07/07/2017 0444   BASOSABS 0.0 07/07/2017 0444    BMET    Component Value Date/Time   NA 140 08/05/2017 0416   NA 120 (L) 06/02/2017 1026   K 3.7 08/05/2017 0416   CL 106 08/05/2017 0416   CO2 24 08/05/2017 0416   GLUCOSE 162 (H) 08/05/2017 0416   BUN 24 (H) 08/05/2017 0416   BUN 17 06/02/2017 1026   CREATININE 0.93 08/05/2017 0416   CALCIUM 9.3 08/05/2017 0416   GFRNONAA >60 08/05/2017 0416   GFRAA >60 08/05/2017 0416    INR    Component Value Date/Time   INR 2.68 07/15/2017 0541     Intake/Output Summary (Last 24 hours) at 08/05/2017 0642 Last data filed at 08/05/2017 0528 Gross per 24 hour  Intake 600 ml  Output 250 ml  Net 350 ml     Assessment:  71 y.o. male is s/p diagnotic angiogram  Plan: He will need to be considered for #1 right common femoral endarterectomy with common femoral to very distal tibial artery bypass.  #2 primary above-knee amputation.  #3 continued wound care. Will get palliative care involvement and discussed with family that nothing needs to be done urgently as toe has been progressively worsening over the course of months.     Mayia Megill C. Randie Heinzain, MD [facility] [place] Office: [phone] Pager:  [phone]  08/05/2017 6:42 AM

## 2017-08-07 NOTE — Discharge Summary (Signed)
[facility] Discharge Summary   Patient ID:  [name] MRN: [id number] DOB/AGE: [birth year] 71 y.o.  Admit date: 08/04/2017 Discharge date: 08/05/2017 Date of Surgery: 08/04/2017 Surgeon: Surgeon(s): Waynetta Sandy, MD  Admission Diagnosis: PAD (peripheral artery disease) [facility]) [I73.9]  Discharge Diagnoses:  PAD (peripheral artery disease) ([place]) [I73.9]  Secondary Diagnoses: Past Medical History:  Diagnosis Date  . CAD (coronary artery disease) 2007   a. CABG 2007 in [place].  . Cerebrovascular disease   . Chronic systolic CHF (congestive heart failure) ([name])    a. EF 20-25% by echo in 03/2017 with NST showing prior infarct but no ischemia. Prior LVEF not known but patient reported history of weak heart.  . Diabetes mellitus without complication ([place])   . Hyperlipidemia   . Hypertension   . Lower extremity embolism ([place])    a. 04/2017 s/p thrombectomy of the right femoropopliteal artery, right below-the-knee popliteal artery endarterectomy with bovine patch angioplasty, and proximal endarterectomy of the right tibioperoneal trunk and peroneal artery..  . Pulmonary embolism ([place])   . PVC's (premature ventricular contractions)   . Stroke [facility])    a. ~2016, also 04/2017 with acute R MCA occlusion s/p thrombectomy.  . Thoracic aortic aneurysm ([place])    a. 4.2 vs 4.6cm by different CTs Aug/Sept 2018.    Procedure(s): ABDOMINAL AORTOGRAM Lower Extremity Angiography  Discharged Condition: good  HPI: [name] is a 71 y.o. (03-29-1946) male who presents with chief complaint: right foot wounds.  This patient underwent a TE R fem-pop, AT and TPT (04/25/17) for what IR reported as an acute occlusion related to a R femoral sheath for R MCA mechanical thrombectomy.  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  The patient's current sx are: one month history worsening wounds in R foot.  The patient denies any fever or chills or drainage.  He has pain in  this R toes at this point.  The patient's treatment regimen currently included: maximal medical management.  Pt remains wheelchair dependent and now in Hospice care at home with his daughter.    Hospital Course:  [name] is a 71 y.o. male is S/P  Procedure(s): ABDOMINAL AORTOGRAM Lower Extremity Angiography   Findings: The aorta and iliac segments are free of flow-limiting stenosis.  The right common femoral artery appears to have a greater than 50% flow-limiting stenosis proximally.  No angulated shots were taken of this.  He has run off bilaterally and his SFAs to the level of the popliteals and on the right the popliteal occludes.  On the left he has in-line flow via the peroneal although it does have a short segment occlusion as well.  On the right he has no discernible tibials until very distally where he has a peroneal and anterior tibial artery.  He will need to be considered for #1 right common femoral endarterectomy with common femoral to very distal tibial artery bypass.  #2 primary above-knee amputation.  #3 continued wound care.   After discussing the angiogram results and options with the patient and his daughter they have opted to return home with hospice care.  He will f/u with Dr. Bridgett Larsson in 2 weeks.  Significant Diagnostic Studies: CBC Lab Results  Component Value Date   WBC 7.2 08/05/2017   HGB 11.9 (L) 08/05/2017   HCT 34.2 (L) 08/05/2017   MCV 70.1 (L) 08/05/2017   PLT 230 08/05/2017    BMET    Component Value Date/Time   NA 140  08/05/2017 0416   NA 120 (L) 06/02/2017 1026   K 3.7 08/05/2017 0416   CL 106 08/05/2017 0416   CO2 24 08/05/2017 0416   GLUCOSE 162 (H) 08/05/2017 0416   BUN 24 (H) 08/05/2017 0416   BUN 17 06/02/2017 1026   CREATININE 0.93 08/05/2017 0416   CALCIUM 9.3 08/05/2017 0416   GFRNONAA >60 08/05/2017 0416   GFRAA >60 08/05/2017 0416   COAG Lab Results  Component Value Date   INR 2.68 07/15/2017   INR 2.95 07/14/2017   INR 2.82  07/13/2017     Disposition:  Discharge to :Home Discharge Instructions    Call MD for:  redness, tenderness, or signs of infection (pain, swelling, bleeding, redness, odor or green/yellow discharge around incision site)   Complete by:  As directed    Call MD for:  severe or increased pain, loss or decreased feeling  in affected limb(s)   Complete by:  As directed    Call MD for:  temperature >100.5   Complete by:  As directed    Discharge instructions   Complete by:  As directed    Reusme activity as tolerates.   Resume previous diet   Complete by:  As directed      Allergies as of 08/05/2017   No Known Allergies     Medication List    TAKE these medications   acetaminophen 325 MG tablet Commonly known as:  TYLENOL Take 2 tablets (650 mg total) by mouth every 4 (four) hours as needed for headache or mild pain.   aspirin 81 MG EC tablet Take 1 tablet (81 mg total) by mouth daily.   atorvastatin 80 MG tablet Commonly known as:  LIPITOR Take 1 tablet (80 mg total) by mouth daily at 6 PM.   digoxin 0.125 MG tablet Commonly known as:  LANOXIN Take 1 tablet (0.125 mg total) by mouth daily.   GLUCERNA Liqd Take 237 mLs by mouth 2 (two) times daily.   feeding supplement (ENSURE ENLIVE) Liqd Take 237 mLs by mouth 2 (two) times daily between meals.   furosemide 40 MG tablet Commonly known as:  LASIX Take 1 tablet (40 mg total) by mouth daily.   glucose blood test strip Commonly known as:  TRUE METRIX BLOOD GLUCOSE TEST Use as instructed   HYDROcodone-acetaminophen 5-325 MG tablet Commonly known as:  NORCO/VICODIN Take 1 tablet by mouth every 6 (six) hours as needed for moderate pain.   insulin glargine 100 UNIT/ML injection Commonly known as:  LANTUS Inject 0.05 mLs (5 Units total) into the skin at bedtime.   lactulose 10 GM/15ML solution Commonly known as:  CHRONULAC Take 15 mLs (10 g total) by mouth 3 (three) times daily.   multivitamin with minerals Tabs  tablet Take 1 tablet by mouth daily.   NOVOLOG 100 UNIT/ML injection Generic drug:  insulin aspart Inject 5 Units into the skin at bedtime.   pantoprazole 40 MG tablet Commonly known as:  PROTONIX Take 1 tablet (40 mg total) by mouth daily.   QUEtiapine 25 MG tablet Commonly known as:  SEROQUEL Take 1 tablet (25 mg total) by mouth 2 (two) times daily.   rivaroxaban 20 MG Tabs tablet Commonly known as:  XARELTO Take 1 tablet (20 mg total) by mouth daily with supper.   senna 8.6 MG Tabs tablet Commonly known as:  SENOKOT Take 2 tablets by mouth 2 (two) times daily.   sodium chloride 0.65 % Soln nasal spray Commonly known as:  OCEAN  Place 1 spray into both nostrils as needed for congestion.   spironolactone 25 MG tablet Commonly known as:  ALDACTONE Take 12.5 mg by mouth daily.   traMADol 50 MG tablet Commonly known as:  ULTRAM Take 1 tablet (50 mg total) by mouth every 12 (twelve) hours as needed for severe pain.   TRUE METRIX METER Devi 1 kit by Does not apply route 4 (four) times daily.   TRUEPLUS LANCETS 28G Misc 28 g by Does not apply route 4 (four) times daily.      Verbal and written Discharge instructions given to the patient. Wound care per Discharge AVS   Signed: Roxy Horseman 08/07/2017, 12:55 PM

## 2017-08-08 ENCOUNTER — Other Ambulatory Visit: Payer: Self-pay

## 2017-08-11 ENCOUNTER — Other Ambulatory Visit: Payer: Self-pay

## 2017-08-11 NOTE — Patient Outreach (Signed)
Telephone outreach to patient to obtain mRS was successfully completed. mRS = 4 

## 2017-08-21 MED FILL — XARELTO 20 MG TABLET: 20 | 30 days supply | Qty: 30 | Fill #1

## 2017-08-25 ENCOUNTER — Encounter (HOSPITAL_COMMUNITY): Payer: Self-pay | Admitting: Vascular Surgery

## 2017-08-25 NOTE — Progress Notes (Signed)
Cardiology Office Note   Date:  08/26/2017   ID:  [name], DOB 1945-12-07, MRN [id number]  PCP:  Arnoldo Morale, MD  Cardiologist:  [facility]    Chief Complaint  Patient presents with  . Pre-op Exam  . Coronary Artery Disease  . Congestive Heart Failure  . Cardiomyopathy     History of Present Illness: [name] is a 72 y.o. male who presents for ongoing assessment and management of coronary artery disease, status post CABG in 2007 in [place]), history of thoracic aortic aneurysm, most recent measurements in 03/2017 (4.2 x 4.6 cm), history of chronic systolic heart failure, hypertension, hyperlipidemia, history of CVA 2016 and 2018, acute R MCA occlusion and popliteal embolic occlusion, (on Xarelto for anticoagulation), type 2 diabetes.  He  is followed by Dr. Haroldine Laws in the Advanced Heart Failure clinic and seen on 07/14/2017.  The patient was found to have acute encephalopathy suspected to be related to previous CVA, UTI, and heart failure.  Echocardiogram dated 07/07/2017 revealed LVEF of 15% to 20%, trivial AI, severe LAE, moderate RV dilatation, moderate to severe RA E, peak pressure 40 mmHg.  The patient was not found to be a candidate for milrinone therapy secondary to CVA and multiple core morbidities.  The patient was seen by Dr. Servando Snare on 08/04/2017 for aortogram with bilateral lower extremity runoff, and ultrasound-guided cannulization of the left common femoral artery.  This was found to be abnormal.  The patient was to be considered for right common femoral endarterectomy with common femoral to very distal tibial artery bypass.  The patient was also to be considered for primary above-the-knee amputation.  Here for cardiac preoperative clearance prior to planned intervention and/or amputation.  The patient does not speak English, therefore interaction is assisted through his daughter i during this office visit.  She states that he is in a lot of pain with the lower  right extremity.  So much so last night he was crying out and very restless.  He is not sleeping well and continues to have constant pain.  The patient is breathing well.  There is no evidence of fluid retention, and continues to diuresis as usual.  Past Medical History:  Diagnosis Date  . CAD (coronary artery disease) 2007   a. CABG 2007 in [place].  . Cerebrovascular disease   . Chronic systolic CHF (congestive heart failure) ([name])    a. EF 20-25% by echo in 03/2017 with NST showing prior infarct but no ischemia. Prior LVEF not known but patient reported history of weak heart.  . Diabetes mellitus without complication ([place])   . Hyperlipidemia   . Hypertension   . Lower extremity embolism ([place])    a. 04/2017 s/p thrombectomy of the right femoropopliteal artery, right below-the-knee popliteal artery endarterectomy with bovine patch angioplasty, and proximal endarterectomy of the right tibioperoneal trunk and peroneal artery..  . Pulmonary embolism ([place])   . PVC's (premature ventricular contractions)   . Stroke [facility])    a. ~2016, also 04/2017 with acute R MCA occlusion s/p thrombectomy.  . Thoracic aortic aneurysm ([place])    a. 4.2 vs 4.6cm by different CTs Aug/Sept 2018.    Past Surgical History:  Procedure Laterality Date  . ABDOMINAL AORTOGRAM N/A 08/04/2017   Procedure: ABDOMINAL AORTOGRAM;  Surgeon: Waynetta Sandy, MD;  Location: [place] [facility];  Service: Cardiovascular;  Laterality: N/A;  . CARDIAC SURGERY    . CYSTOSCOPY N/A 04/25/2017   Procedure: CYSTOSCOPY FLEXIBLE;  Surgeon: Adele Barthel  L, MD;  Location: [place];  Service: Vascular;  Laterality: N/A;  . EMBOLECTOMY Right 04/25/2017   Procedure: EMBOLECTOMY RIGHT FEMORAL ARTERY;  Surgeon: Conrad Weston, MD;  Location: [place];  Service: Vascular;  Laterality: Right;  . IR PERCUTANEOUS ART THROMBECTOMY/INFUSION INTRACRANIAL INC DIAG ANGIO  04/25/2017  . LOWER EXTREMITY ANGIOGRAPHY Bilateral 08/04/2017   Procedure: Lower  Extremity Angiography;  Surgeon: Waynetta Sandy, MD;  Location: [place] [facility];  Service: Cardiovascular;  Laterality: Bilateral;  . PATCH ANGIOPLASTY Right 04/25/2017   Procedure: PATCH ANGIOPLASTY USING [place];  Surgeon: Conrad [place], MD;  Location: [place];  Service: Vascular;  Laterality: Right;  . PROSTATE SURGERY  2002  . RADIOLOGY WITH ANESTHESIA N/A 04/24/2017   Procedure: RADIOLOGY WITH ANESTHESIA Code Stroke;  Surgeon: Corrie Mckusick, DO;  Location: [place];  Service: Anesthesiology;  Laterality: N/A;     Current Outpatient Medications  Medication Sig Dispense Refill  . acetaminophen (TYLENOL) 325 MG tablet Take 2 tablets (650 mg total) by mouth every 4 (four) hours as needed for headache or mild pain.    Marland Kitchen aspirin EC 81 MG EC tablet Take 1 tablet (81 mg total) by mouth daily.    Marland Kitchen atorvastatin (LIPITOR) 80 MG tablet Take 1 tablet (80 mg total) by mouth daily at 6 PM. 30 tablet 11  . Blood Glucose Monitoring Suppl (TRUE METRIX METER) DEVI 1 kit by Does not apply route 4 (four) times daily. 1 Device 0  . digoxin (LANOXIN) 0.125 MG tablet Take 1 tablet (0.125 mg total) by mouth daily. 30 tablet 0  . feeding supplement, ENSURE ENLIVE, (ENSURE ENLIVE) LIQD Take 237 mLs by mouth 2 (two) times daily between meals. 237 mL 12  . furosemide (LASIX) 40 MG tablet Take 1 tablet (40 mg total) by mouth daily. 30 tablet 0  . GLUCERNA (GLUCERNA) LIQD Take 237 mLs by mouth 2 (two) times daily.    Marland Kitchen glucose blood (TRUE METRIX BLOOD GLUCOSE TEST) test strip Use as instructed 100 each 12  . HYDROcodone-acetaminophen (NORCO/VICODIN) 5-325 MG tablet Take 1 tablet by mouth every 6 (six) hours as needed for moderate pain.    Marland Kitchen insulin glargine (LANTUS) 100 UNIT/ML injection Inject 0.05 mLs (5 Units total) into the skin at bedtime. 10 mL 11  . NOVOLOG 100 UNIT/ML injection Inject 5 Units into the skin at bedtime.  11  . pantoprazole (PROTONIX) 40 MG tablet Take 1 tablet (40 mg total)  by mouth daily. 30 tablet 5  . QUEtiapine (SEROQUEL) 25 MG tablet Take 1 tablet (25 mg total) by mouth 2 (two) times daily. 60 tablet 0  . rivaroxaban (XARELTO) 20 MG TABS tablet Take 1 tablet (20 mg total) by mouth daily with supper. 30 tablet 3  . senna (SENOKOT) 8.6 MG TABS tablet Take 2 tablets by mouth 2 (two) times daily.    . sodium chloride (OCEAN) 0.65 % SOLN nasal spray Place 1 spray into both nostrils as needed for congestion.    Marland Kitchen spironolactone (ALDACTONE) 25 MG tablet Take 12.5 mg by mouth daily.    . traMADol (ULTRAM) 50 MG tablet Take 1 tablet (50 mg total) by mouth every 12 (twelve) hours as needed for severe pain. 10 tablet 0  . TRUEPLUS LANCETS 28G MISC 28 g by Does not apply route 4 (four) times daily. 120 each 11   No current facility-administered medications for this visit.     Allergies:   Patient has no known allergies.  Social History:  The patient  reports that  has never smoked. he has never used smokeless tobacco. He reports that he does not drink alcohol or use drugs.   Family History:  The patient's family history includes Hypertension in his mother and sister.    ROS: All other systems are reviewed and negative. Unless otherwise mentioned in H&P    PHYSICAL EXAM: VS:  BP 100/64   Pulse (!) 102   Ht '5\' 10"'  (1.778 m)   Wt 140 lb 9.6 oz (63.8 kg)   SpO2 96%   BMI 20.17 kg/m  , BMI Body mass index is 20.17 kg/m. GEN: Well nourished, well developed, in no acute distress  HEENT: normal  Neck: no JVD, carotid bruits, or masses Cardiac:RRR, tachycardic; no murmurs, rubs, or gallops,no edema  Respiratory:  clear to auscultation bilaterally, normal work of breathing GI: soft, nontender, nondistended, + BS MS: no deformity or atrophy right lower leg edematous, some erythema, 3rd toe blackened with dressing applied.  No pulses are palpated.  Cool to touch.  Left lower extremity pulses are diminished.  Edema. Skin: warm and dry, no rash Neuro:  Strength and  sensation are intact Psych: euthymic mood, full affect  Recent Labs: 07/06/2017: B Natriuretic Peptide 3,837.7 07/07/2017: ALT 54 07/08/2017: Magnesium 2.0 08/05/2017: BUN 24; Creatinine, Ser 0.93; Hemoglobin 11.9; Platelets 230; Potassium 3.7; Sodium 140    Lipid Panel    Component Value Date/Time   CHOL 92 04/25/2017 2317   TRIG 82 04/25/2017 2317   HDL 35 (L) 04/25/2017 2317   CHOLHDL 2.6 04/25/2017 2317   VLDL 16 04/25/2017 2317   LDLCALC 41 04/25/2017 2317      Wt Readings from Last 3 Encounters:  08/26/17 140 lb 9.6 oz (63.8 kg)  08/04/17 162 lb (73.5 kg)  08/01/17 162 lb 11.2 oz (73.8 kg)      Other studies Reviewed: NM Stress Test 04/19/2017  There was no ST segment deviation noted during stress.  Defect 1: There is a medium defect of severe severity present in the basal inferolateral, mid inferior, mid inferolateral and apical inferior location.  Findings consistent with prior myocardial infarction.  This is a high risk study due to reduced systolic function.  The left ventricular ejection fraction is severely decreased (<30%).  No ischemia.   Echocardiogram 07/07/2017 Left ventricle: The cavity size was moderately dilated. Wall   thickness was normal. Systolic function was severely reduced. The   estimated ejection fraction was in the range of 15% to 20%.   Severe diffuse hypokinesis with no identifiable regional   variations. There was fusion of early and atrial contributions to   ventricular filling. Doppler parameters are consistent with   elevated mean left atrial filling pressure. No evidence of   thrombus. - Aortic valve: There was trivial regurgitation. - Mitral valve: Calcified annulus. - Left atrium: The atrium was severely dilated. - Right ventricle: The cavity size was moderately dilated. - Right atrium: The atrium was moderately to severely dilated. - Pulmonary arteries: Systolic pressure was mildly increased. PA   peak pressure: 40 mm  Hg (S).  Aortogram with runoff 07/19/2017 Findings:The aorta and iliac segments are free of flow-limiting stenosis. The right common femoral artery appears to have a greater than 50% flow-limiting stenosis proximally. No angulated shots were taken of this. He has run off bilaterally and his SFAs to the level of the popliteals and on the right the popliteal occludes. On the left he has in-line flow via the  peroneal although it does have a short segment occlusion as well. On the right he has no discernible tibials until very distally where he has a peroneal and anterior tibial artery.  He will need to be considered for #1 right common femoral endarterectomy with common femoral to very distal tibial artery bypass. #2 primary above-knee amputation. #3 continuedwound care.    ASSESSMENT AND PLAN:  1.  Preoperative cardiac evaluation: The patient is of high risk for surgical intervention, and or amputation.  I have spoken with Dr. Sallyanne Kuster (DOD), , who has also spoken with Dr. Stanford Breed and with Dr. Glori Bickers his primary cardiologist as well as Advanced Heart Failure cardiologist.  They have reviewed his case, and discussed it.  Although this is high risk it is imperative that surgical intervention or amputation be completed.  Will defer anesthesia choice to Dr. Kasandra Knudsen  From a cardiac standpoint he is cleared to proceed with planned surgery.  Of course, cardiology will be available from cardiac and heart failure standpoint to assist during postoperative recovery if needed.  2.  Severe Systolic CHF: Most recent echocardiogram as above with ejection fraction of 15%.  He is being followed by Dr. Glori Bickers  and has had recent admission with milrinone infusion.  Not found to be a candidate for home milrinone.  He remains on Lasix 40 mg daily and spironolactone, heart rate is not well controlled currently,  he remains on digoxin.  We will not add beta-blocker as his blood pressure is soft but  optimal for current ejection fraction.  Would not mind seeing it a little lower if he could tolerate it.  He is currently wheelchair-bound but does get up and walk to the bathroom with a walker when necessary. He has been without shortness of breath since discharge.  3.  Severe peripheral vascular disease: Being followed by Dr. Kasandra Knudsen. Planned timing of surgery is deferred to him.  The patient is on Xarelto, will need to hold this for 48 hours prior to planned procedure.  This will be arranged by surgeon depending upon date of procedure.  4.  History of CVA: R MCA occlusion On Xarelto with cessation based upon timing of surgery.   5. CAD: Hx of CABG.: Continues on statin therapy Not on ASA as he is on DOAC.      Current medicines are reviewed at length with the patient today.    Labs/ tests ordered today include:  Phill Myron. [place] Pugh, ANP, AACC   08/26/2017 4:54 PM    [facility] [address] [place], [place] [address] Phone: [phone]; Fax: [phone]

## 2017-08-26 ENCOUNTER — Encounter: Payer: Self-pay | Admitting: Adult Health

## 2017-08-26 ENCOUNTER — Ambulatory Visit (INDEPENDENT_AMBULATORY_CARE_PROVIDER_SITE_OTHER): Payer: No Typology Code available for payment source | Admitting: Adult Health

## 2017-08-26 VITALS — BP 100/64 | HR 102 | Ht 70.0 in | Wt 140.6 lb

## 2017-08-26 DIAGNOSIS — I5022 Chronic systolic (congestive) heart failure: Secondary | ICD-10-CM

## 2017-08-26 DIAGNOSIS — I255 Ischemic cardiomyopathy: Secondary | ICD-10-CM

## 2017-08-26 DIAGNOSIS — I739 Peripheral vascular disease, unspecified: Secondary | ICD-10-CM

## 2017-08-26 DIAGNOSIS — I2510 Atherosclerotic heart disease of native coronary artery without angina pectoris: Secondary | ICD-10-CM

## 2017-08-26 NOTE — Patient Instructions (Signed)
Medication Instructions:  NO CHANGES AT THIS TIME  If you need a refill on your cardiac medications before your next appointment, please call your pharmacy.  Special Instructions: CALL DR CAIN'S OFFICE TO DISCUSS SURGERY DATE  CLEARED FOR SURGERY-HIGH RISK, NOT PROHIBITED FOR SURGERY   Thank you [facility]!!

## 2017-08-27 ENCOUNTER — Telehealth: Payer: Self-pay | Admitting: *Deleted

## 2017-08-27 NOTE — Telephone Encounter (Signed)
Call from patient's daughter " We want to schedule his surgery" after reviewing notes past Angiogram scheduled appointment for family to come in and speak with Dr. Imogene Burnhen date and time give and informed to "do not miss" this appointment. Daughter Leona SingletonSaimate is to be with him to translate.

## 2017-08-28 NOTE — H&P (View-Only) (Signed)
  Postoperative Visit (Angio)   History of Present Illness   [name] is a 71 y.o. male who presents cc:  Severe R foot pain.  This patient previous was admitted with acute on chronic heart failure.  The patient underwent a mechanical thrombectomy of right MCA.  Reportedly the patient had acute thrombosis of his R SFA.  He underwent R fem-pop TE, R BKA pop EA w/ BPA, Proximal EA of R TPT and peroneal artery (04/25/17).  Intraoperative findings were consistent with chronic disease rather than acute disease.  Pt started develop wounds on his R foot and was sent for Aortogram and bilateral leg runoff  With Dr. Cain on 08/04/17.  Based on the images, there is a severe common femoral artery stenosis and occluded popliteal artery with occlusion of all proximal tibial arteries.    This patient also has significant cardiac history with chronic systolic heart failure,  EF 15-20%.    Patient's neurologic status is: unchanged with residual weakness, still wheelchair bound.  Patient's sx are: severe rest pain with black R 3rd toe.   Past Medical History, Past Surgical History, Social History, Family History, Medications, Allergies, and Review of Systems are unchanged from previous evaluation on 08/04/17 .  Current Outpatient Medications  Medication Sig Dispense Refill  . acetaminophen (TYLENOL) 325 MG tablet Take 2 tablets (650 mg total) by mouth every 4 (four) hours as needed for headache or mild pain.    . aspirin EC 81 MG EC tablet Take 1 tablet (81 mg total) by mouth daily.    . atorvastatin (LIPITOR) 80 MG tablet Take 1 tablet (80 mg total) by mouth daily at 6 PM. 30 tablet 11  . Blood Glucose Monitoring Suppl (TRUE METRIX METER) DEVI 1 kit by Does not apply route 4 (four) times daily. 1 Device 0  . digoxin (LANOXIN) 0.125 MG tablet Take 1 tablet (0.125 mg total) by mouth daily. 30 tablet 0  . feeding supplement, ENSURE ENLIVE, (ENSURE ENLIVE) LIQD Take 237 mLs by mouth 2 (two) times daily  between meals. 237 mL 12  . furosemide (LASIX) 40 MG tablet Take 1 tablet (40 mg total) by mouth daily. 30 tablet 0  . GLUCERNA (GLUCERNA) LIQD Take 237 mLs by mouth 2 (two) times daily.    . glucose blood (TRUE METRIX BLOOD GLUCOSE TEST) test strip Use as instructed 100 each 12  . HYDROcodone-acetaminophen (NORCO/VICODIN) 5-325 MG tablet Take 1 tablet by mouth every 6 (six) hours as needed for moderate pain.    . insulin glargine (LANTUS) 100 UNIT/ML injection Inject 0.05 mLs (5 Units total) into the skin at bedtime. 10 mL 11  . NOVOLOG 100 UNIT/ML injection Inject 5 Units into the skin at bedtime.  11  . pantoprazole (PROTONIX) 40 MG tablet Take 1 tablet (40 mg total) by mouth daily. 30 tablet 5  . QUEtiapine (SEROQUEL) 25 MG tablet Take 1 tablet (25 mg total) by mouth 2 (two) times daily. 60 tablet 0  . rivaroxaban (XARELTO) 20 MG TABS tablet Take 1 tablet (20 mg total) by mouth daily with supper. 30 tablet 3  . senna (SENOKOT) 8.6 MG TABS tablet Take 2 tablets by mouth 2 (two) times daily.    . sodium chloride (OCEAN) 0.65 % SOLN nasal spray Place 1 spray into both nostrils as needed for congestion.    . spironolactone (ALDACTONE) 25 MG tablet Take 12.5 mg by mouth daily.    . traMADol (ULTRAM) 50 MG tablet Take 1 tablet (50   mg total) by mouth every 12 (twelve) hours as needed for severe pain. 10 tablet 0  . TRUEPLUS LANCETS 28G MISC 28 g by Does not apply route 4 (four) times daily. 120 each 11   No current facility-administered medications for this visit.     ROS: rest pain, no fever or chills   For VQI Use Only   PRE-ADM LIVING: Nursing home  AMB STATUS: Wheelchair   Physical Examination   Vitals:   08/29/17 1536  BP: 99/62  Pulse: (!) 102  Resp: 16  Temp: 99.2 F (37.3 C)  TempSrc: Oral  SpO2: 93%  Weight: 140 lb 9.6 oz (63.8 kg)  Height: 5' 10" (1.778 m)   Body mass index is 20.17 kg/m.  General Alert, non-interactive, obvious pain  Pulmonary Sym exp, good B  air movt, CTA B  Cardiac RRR, Nl S1, S2, no Murmurs, No rubs, No S3,S4  Vascular Vessel Right Left  Radial Palpable Palpable  Brachial Palpable Palpable  Carotid Palpable, No Bruit Palpable, No Bruit  Aorta Not palpable N/A  Femoral Palpable Palpable  Popliteal Not palpable Not palpable  PT Not palpable Not palpable  DP Not palpable Not palpable    Gastrointestinal soft, non-distended, non-tender to palpation, No guarding or rebound, no HSM, no masses, no CVAT B, No palpable prominent aortic pulse,    Musculoskeletal M/S 5/5 throughout  , Extremities without ischemic changes except R 3rd toe dry gangrene, No edema present,  Neurologic  Pain and light touch intact in extremities , Motor exam as listed above    Medical Decision Making   [name] is a 71 y.o. male who presents s/p R fem-pop TE, R BKA pop EA w/ BPA, Proximal EA of R TPT and peroneal artery for chronic popliteal occlusion and tibial disease  Recent angiogram demonstrates: severe CFA stenosis and miniscule targets.  Based on his angiographic findings, this patient needs: R iliofemoral endarterectomy with bovine patch angioplasty, possible R fem-tib bypass, and R 3rd toe amputation. The risk, benefits, and alternative for bypass operations were discussed with the patient.   The patient is aware the risks include but are not limited to: bleeding, infection, myocardial infarction, stroke, limb loss, nerve damage, limb edema, need for additional procedures in the future, wound complications, and inability to complete the bypass.  The patient is aware of these risks and agreed to proceed. I discussed with his daughter that his distal tibial arteries were very small and the bypass might not be possible.   Pt still needs a B GSV mapping to see if conduit is available. I discussed in depth with the patient the nature of atherosclerosis, and emphasized the importance of maximal medical management including strict control of blood  pressure, blood glucose, and lipid levels, obtaining regular exercise, and cessation of smoking.  The patient is aware that without maximal medical management the underlying atherosclerotic disease process will progress, limiting the benefit of any interventions. The patient is currently on a statin: Lipitor.  The patient is currently on an anti-platelet: ASA.  Patient is also on Xarelto  Thank you for allowing us to participate in this patient's care.   Brian Chen, MD, FACS [facility] Office: [phone] Pager: [phone]  

## 2017-08-28 NOTE — Progress Notes (Addendum)
Postoperative Visit (Angio)   History of Present Illness   [name] is a 72 y.o. male who presents cc:  Severe R foot pain.  This patient previous was admitted with acute on chronic heart failure.  The patient underwent a mechanical thrombectomy of right MCA.  Reportedly the patient had acute thrombosis of his R SFA.  He underwent R fem-pop TE, R BKA pop EA w/ BPA, Proximal EA of R TPT and peroneal artery (04/25/17).  Intraoperative findings were consistent with chronic disease rather than acute disease.  Pt started develop wounds on his R foot and was sent for Aortogram and bilateral leg runoff  With Dr. Donzetta Matters on 08/04/17.  Based on the images, there is a severe common femoral artery stenosis and occluded popliteal artery with occlusion of all proximal tibial arteries.    This patient also has significant cardiac history with chronic systolic heart failure,  EF 15-20%.    Patient's neurologic status is: unchanged with residual weakness, still wheelchair bound.  Patient's sx are: severe rest pain with black R 3rd toe.   Past Medical History, Past Surgical History, Social History, Family History, Medications, Allergies, and Review of Systems are unchanged from previous evaluation on 08/04/17 .  Current Outpatient Medications  Medication Sig Dispense Refill  . acetaminophen (TYLENOL) 325 MG tablet Take 2 tablets (650 mg total) by mouth every 4 (four) hours as needed for headache or mild pain.    Marland Kitchen aspirin EC 81 MG EC tablet Take 1 tablet (81 mg total) by mouth daily.    Marland Kitchen atorvastatin (LIPITOR) 80 MG tablet Take 1 tablet (80 mg total) by mouth daily at 6 PM. 30 tablet 11  . Blood Glucose Monitoring Suppl (TRUE METRIX METER) DEVI 1 kit by Does not apply route 4 (four) times daily. 1 Device 0  . digoxin (LANOXIN) 0.125 MG tablet Take 1 tablet (0.125 mg total) by mouth daily. 30 tablet 0  . feeding supplement, ENSURE ENLIVE, (ENSURE ENLIVE) LIQD Take 237 mLs by mouth 2 (two) times daily  between meals. 237 mL 12  . furosemide (LASIX) 40 MG tablet Take 1 tablet (40 mg total) by mouth daily. 30 tablet 0  . GLUCERNA (GLUCERNA) LIQD Take 237 mLs by mouth 2 (two) times daily.    Marland Kitchen glucose blood (TRUE METRIX BLOOD GLUCOSE TEST) test strip Use as instructed 100 each 12  . HYDROcodone-acetaminophen (NORCO/VICODIN) 5-325 MG tablet Take 1 tablet by mouth every 6 (six) hours as needed for moderate pain.    Marland Kitchen insulin glargine (LANTUS) 100 UNIT/ML injection Inject 0.05 mLs (5 Units total) into the skin at bedtime. 10 mL 11  . NOVOLOG 100 UNIT/ML injection Inject 5 Units into the skin at bedtime.  11  . pantoprazole (PROTONIX) 40 MG tablet Take 1 tablet (40 mg total) by mouth daily. 30 tablet 5  . QUEtiapine (SEROQUEL) 25 MG tablet Take 1 tablet (25 mg total) by mouth 2 (two) times daily. 60 tablet 0  . rivaroxaban (XARELTO) 20 MG TABS tablet Take 1 tablet (20 mg total) by mouth daily with supper. 30 tablet 3  . senna (SENOKOT) 8.6 MG TABS tablet Take 2 tablets by mouth 2 (two) times daily.    . sodium chloride (OCEAN) 0.65 % SOLN nasal spray Place 1 spray into both nostrils as needed for congestion.    Marland Kitchen spironolactone (ALDACTONE) 25 MG tablet Take 12.5 mg by mouth daily.    . traMADol (ULTRAM) 50 MG tablet Take 1 tablet (50  mg total) by mouth every 12 (twelve) hours as needed for severe pain. 10 tablet 0  . TRUEPLUS LANCETS 28G MISC 28 g by Does not apply route 4 (four) times daily. 120 each 11   No current facility-administered medications for this visit.     ROS: rest pain, no fever or chills   For VQI Use Only   PRE-ADM LIVING: Nursing home  AMB STATUS: Wheelchair   Physical Examination   Vitals:   08/29/17 1536  BP: 99/62  Pulse: (!) 102  Resp: 16  Temp: 99.2 F (37.3 C)  TempSrc: Oral  SpO2: 93%  Weight: 140 lb 9.6 oz (63.8 kg)  Height: _0  (1.778 m)   Body mass index is 20.17 kg/m.  General Alert, non-interactive, obvious pain  Pulmonary Sym exp, good B  air movt, CTA B  Cardiac RRR, Nl S1, S2, no Murmurs, No rubs, No S3,S4  Vascular Vessel Right Left  Radial Palpable Palpable  Brachial Palpable Palpable  Carotid Palpable, No Bruit Palpable, No Bruit  Aorta Not palpable N/A  Femoral Palpable Palpable  Popliteal Not palpable Not palpable  PT Not palpable Not palpable  DP Not palpable Not palpable    Gastrointestinal soft, non-distended, non-tender to palpation, No guarding or rebound, no HSM, no masses, no CVAT B, No palpable prominent aortic pulse,    Musculoskeletal M/S 5/5 throughout  , Extremities without ischemic changes except R 3rd toe dry gangrene, No edema present,  Neurologic  Pain and light touch intact in extremities , Motor exam as listed above    Medical Decision Making   [name] is a 72 y.o. male who presents s/p R fem-pop TE, R BKA pop EA w/ BPA, Proximal EA of R TPT and peroneal artery for chronic popliteal occlusion and tibial disease  Recent angiogram demonstrates: severe CFA stenosis and miniscule targets.  Based on his angiographic findings, this patient needs: R iliofemoral endarterectomy with bovine patch angioplasty, possible R fem-tib bypass, and R 3rd toe amputation. The risk, benefits, and alternative for bypass operations were discussed with the patient.   The patient is aware the risks include but are not limited to: bleeding, infection, myocardial infarction, stroke, limb loss, nerve damage, limb edema, need for additional procedures in the future, wound complications, and inability to complete the bypass.  The patient is aware of these risks and agreed to proceed. I discussed with his daughter that his distal tibial arteries were very small and the bypass might not be possible.   Pt still needs a B GSV mapping to see if conduit is available. I discussed in depth with the patient the nature of atherosclerosis, and emphasized the importance of maximal medical management including strict control of blood  pressure, blood glucose, and lipid levels, obtaining regular exercise, and cessation of smoking.  The patient is aware that without maximal medical management the underlying atherosclerotic disease process will progress, limiting the benefit of any interventions. The patient is currently on a statin: Lipitor.  The patient is currently on an anti-platelet: ASA.  Patient is also on Xarelto  Thank you for allowing [place] to participate in this patient's care.   Adele Barthel, MD, FACS [facility] [place] [facility] Office: [phone] Pager: [phone]

## 2017-08-29 ENCOUNTER — Telehealth: Payer: Self-pay | Admitting: *Deleted

## 2017-08-29 ENCOUNTER — Ambulatory Visit (INDEPENDENT_AMBULATORY_CARE_PROVIDER_SITE_OTHER): Payer: No Typology Code available for payment source | Admitting: Vascular Surgery

## 2017-08-29 ENCOUNTER — Other Ambulatory Visit: Payer: Self-pay | Admitting: *Deleted

## 2017-08-29 ENCOUNTER — Encounter: Payer: Self-pay | Admitting: *Deleted

## 2017-08-29 ENCOUNTER — Encounter: Payer: Self-pay | Admitting: Vascular Surgery

## 2017-08-29 VITALS — BP 99/62 | HR 102 | Temp 99.2°F | Resp 16 | Ht 70.0 in | Wt 140.6 lb

## 2017-08-29 DIAGNOSIS — I70269 Atherosclerosis of native arteries of extremities with gangrene, unspecified extremity: Secondary | ICD-10-CM

## 2017-08-29 MED ORDER — OXYCODONE-ACETAMINOPHEN 5-325 MG PO TABS: 1.0000 | ORAL_TABLET | ORAL | 0 refills | Status: DC | PRN

## 2017-08-29 NOTE — Telephone Encounter (Signed)
Call to patient's daughter reminded to stay on Aspirin and hold Xarelto 2 days prior to surgery. Per Dr. Imogene Burnhen.

## 2017-09-01 ENCOUNTER — Encounter (HOSPITAL_COMMUNITY): Payer: Self-pay | Admitting: *Deleted

## 2017-09-01 ENCOUNTER — Ambulatory Visit (INDEPENDENT_AMBULATORY_CARE_PROVIDER_SITE_OTHER)
Admission: RE | Admit: 2017-09-01 | Discharge: 2017-09-01 | Disposition: A | Discharge status: Home / Self Care | Payer: Medicaid Other | Source: Ambulatory Visit | Attending: Surgery | Admitting: Surgery

## 2017-09-01 ENCOUNTER — Encounter (HOSPITAL_COMMUNITY): Payer: No Typology Code available for payment source

## 2017-09-01 DIAGNOSIS — Z01818 Encounter for other preprocedural examination: Secondary | ICD-10-CM

## 2017-09-01 DIAGNOSIS — I70269 Atherosclerosis of native arteries of extremities with gangrene, unspecified extremity: Secondary | ICD-10-CM | POA: Insufficient documentation

## 2017-09-01 NOTE — Progress Notes (Signed)
Spoke to daughter for preop call and provided all instructions to daughter. She reports no change in his cardiac status. Below instructions given to daughter regarding DM.     . If your blood sugar is less than 70 mg/dL, you will need to treat for low blood sugar: o Do not take insulin. o Treat a low blood sugar (less than 70 mg/dL) with  cup of clear juice (cranberry or apple), 4 glucose tablets, OR glucose gel. Recheck blood sugar in 15 minutes after treatment (to make sure it is greater than 70 mg/dL). If your blood sugar is not greater than 70 mg/dL on recheck, call [phone] o  for further instructions. . Report your blood sugar to the short stay nurse when you get to Short Stay.  . If you are admitted to the hospital after surgery: o Your blood sugar will be checked by the staff and you will probably be given insulin after surgery (instead of oral diabetes medicines) to make sure you have good blood sugar levels. o The goal for blood sugar control after surgery is 80-180 mg/dL.    WHAT DO I DO ABOUT MY DIABETES MEDICATION?   Marland Kitchen. Do not take oral diabetes medicines (pills) the morning of surgery.  . THE NIGHT BEFORE SURGERY, take __2.5_________ units of _Lantus____insulin and no novolog.

## 2017-09-01 NOTE — Progress Notes (Signed)
LVM with in house interpreter services requesting a [place] interpreter on Tuesday, 09/02/17 at 7:30 am on the Short [facility]. Office contact number was left in voice message for f/u.

## 2017-09-01 NOTE — Progress Notes (Signed)
Anesthesia Chart Review:  Pt is a same day work up  Pt does not speak [place], requires [place] interpreter  Pt is 72 year old male scheduled for R endarterectomy ilio-femoral with bovine patch angioplasty, amputation R 3rd toe, R femoral-tibial bypass graft on 09/02/2017 with Leonides SakeBrian Chen, MD  Providers:  - PCP is Jaclyn ShaggyEnobong Amao, MD  - Cardiologist is Olga MillersBrian Crenshaw, MD.  Cleared for surgery at high risk at last office visit 08/26/17 with Joni ReiningKathryn Lawrence, DNP in consultation with Dr. Royann Shiversroitoru, Dr. Jens Somrenshaw, and Dr. Gala RomneyBensimhon.   - HF cardiologist is Arvilla Meresaniel Bensimhon, MD. Last office visit 07/14/17  PMH includes:  CAD (s/p CABG 2007 in [place]), CHF, ischemic cardiomyopathy, HTN, DM, hyperipidemia, stroke (04/2017 and 2016), thoracic aortic aneurysm, PE. Never smoker. BMI 20. S/p R femoral embolectomy 04/25/17.   - Hospitalized 12/17-18/18 for abdominal aortogram  - Hospitalized 11/12-27/18 for CAP, UTI, encephalopathy, acute on chronic CHF. Discharged home with home hospice with poor prognosis.   - Hospitalized 9/6-17/18 with acute on chronic CHF. While inpatient, developed acute CVA, R popliteal acute embolic occlusion (S/p thrombectomy)  - Hospitalized 8/30-04/20/17 for CHF   Medications include: ASA 81 mg, Lipitor, digoxin, Lasix, Lantus, NovoLog, Protonix, Seroquel, Xarelto, spironolactone. Pt to hold xarelto 2 days before surgery  Labs will be obtained the day of surgery.  1 view CXR 07/08/17:  1. Indistinct obscuration of both hemidiaphragms potentially from her space opacities or layering pleural effusions. Relative clearing of the upper lobes compared to 07/05/17. 2. Stable enlargement of the cardiopericardial silhouette. 3.  Aortic Atherosclerosis   EKG 07/06/17:  - Sinus tachycardia (110 bpm) with occasional PVCs. Right axis deviation. Non-specific intra-ventricular conduction block. Cannot rule out Anterior infarct, age undetermined. T wave abnormality, consider lateral  ischemia  Echo 07/07/17:  - Left ventricle: The cavity size was moderately dilated. Wall thickness was normal. Systolic function was severely reduced. The estimated ejection fraction was in the range of 15% to 20%. Severe diffuse hypokinesis with no identifiable regional variations. There was fusion of early and atrial contributions to ventricular filling. Doppler parameters are consistent with elevated mean left atrial filling pressure. No evidence of thrombus. - Aortic valve: There was trivial regurgitation. - Mitral valve: Calcified annulus. - Left atrium: The atrium was severely dilated. - Right ventricle: The cavity size was moderately dilated. - Right atrium: The atrium was moderately to severely dilated. - Pulmonary arteries: Systolic pressure was mildly increased. PA peak pressure: 40 mm Hg (S).  Nuclear stress test 04/18/17:   There was no ST segment deviation noted during stress.  Defect 1: There is a medium defect of severe severity present in the basal inferolateral, mid inferior, mid inferolateral and apical inferior location.  Findings consistent with prior myocardial infarction.  This is a high risk study due to reduced systolic function.  The left ventricular ejection fraction is severely decreased (<30%).  No ischemia  CT angio chest 04/18/17:  1. No evidence pulmonary embolus. 2. Hazy left lower lobe airspace disease in the infrahilar region which may reflect atelectasis versus pneumonia. 3. Ascending thoracic aortic aneurysm measuring 4.6 cm. Recommend semi-annual imaging followup by CTA or MRA and referral to cardiothoracic surgery if not already obtained.  If labs acceptable day of surgery, I anticipate pt can proceed with surgery as scheduled.   Rica Mastngela Clark Cuff, FNP-BC [facility]Anesthesiology Phone: [phone] 09/01/2017 3:11 PM

## 2017-09-02 ENCOUNTER — Inpatient Hospital Stay (HOSPITAL_COMMUNITY): Payer: Medicaid Other | Admitting: Emergency Medicine

## 2017-09-02 ENCOUNTER — Encounter (HOSPITAL_COMMUNITY): Payer: Self-pay

## 2017-09-02 ENCOUNTER — Encounter (HOSPITAL_COMMUNITY)
Admission: RE | Disposition: A | Discharge status: Hospice | Payer: Self-pay | Source: Home / Self Care | Attending: Vascular Surgery

## 2017-09-02 ENCOUNTER — Inpatient Hospital Stay (HOSPITAL_COMMUNITY)
Admission: RE | Admit: 2017-09-02 | Discharge: 2017-09-04 | DRG: 253 | Disposition: A | Discharge status: Hospice | Payer: Medicaid Other | Attending: Vascular Surgery | Admitting: Vascular Surgery

## 2017-09-02 ENCOUNTER — Other Ambulatory Visit: Payer: Self-pay

## 2017-09-02 DIAGNOSIS — Z8673 Personal history of transient ischemic attack (TIA), and cerebral infarction without residual deficits: Secondary | ICD-10-CM

## 2017-09-02 DIAGNOSIS — I70269 Atherosclerosis of native arteries of extremities with gangrene, unspecified extremity: Secondary | ICD-10-CM | POA: Diagnosis present

## 2017-09-02 DIAGNOSIS — Z993 Dependence on wheelchair: Secondary | ICD-10-CM

## 2017-09-02 DIAGNOSIS — Z7901 Long term (current) use of anticoagulants: Secondary | ICD-10-CM

## 2017-09-02 DIAGNOSIS — N400 Enlarged prostate without lower urinary tract symptoms: Secondary | ICD-10-CM | POA: Diagnosis present

## 2017-09-02 DIAGNOSIS — I110 Hypertensive heart disease with heart failure: Secondary | ICD-10-CM | POA: Diagnosis present

## 2017-09-02 DIAGNOSIS — Z794 Long term (current) use of insulin: Secondary | ICD-10-CM | POA: Diagnosis not present

## 2017-09-02 DIAGNOSIS — I5022 Chronic systolic (congestive) heart failure: Secondary | ICD-10-CM | POA: Diagnosis present

## 2017-09-02 DIAGNOSIS — I998 Other disorder of circulatory system: Secondary | ICD-10-CM | POA: Diagnosis present

## 2017-09-02 DIAGNOSIS — Z86711 Personal history of pulmonary embolism: Secondary | ICD-10-CM

## 2017-09-02 DIAGNOSIS — L97519 Non-pressure chronic ulcer of other part of right foot with unspecified severity: Secondary | ICD-10-CM | POA: Diagnosis present

## 2017-09-02 DIAGNOSIS — I70201 Unspecified atherosclerosis of native arteries of extremities, right leg: Secondary | ICD-10-CM | POA: Diagnosis present

## 2017-09-02 DIAGNOSIS — I429 Cardiomyopathy, unspecified: Secondary | ICD-10-CM | POA: Diagnosis present

## 2017-09-02 DIAGNOSIS — I2510 Atherosclerotic heart disease of native coronary artery without angina pectoris: Secondary | ICD-10-CM | POA: Diagnosis present

## 2017-09-02 DIAGNOSIS — Z79899 Other long term (current) drug therapy: Secondary | ICD-10-CM

## 2017-09-02 DIAGNOSIS — E11621 Type 2 diabetes mellitus with foot ulcer: Secondary | ICD-10-CM | POA: Diagnosis present

## 2017-09-02 DIAGNOSIS — E785 Hyperlipidemia, unspecified: Secondary | ICD-10-CM | POA: Diagnosis present

## 2017-09-02 DIAGNOSIS — Z951 Presence of aortocoronary bypass graft: Secondary | ICD-10-CM | POA: Diagnosis not present

## 2017-09-02 DIAGNOSIS — M79671 Pain in right foot: Secondary | ICD-10-CM | POA: Diagnosis present

## 2017-09-02 DIAGNOSIS — E1152 Type 2 diabetes mellitus with diabetic peripheral angiopathy with gangrene: Secondary | ICD-10-CM | POA: Diagnosis present

## 2017-09-02 DIAGNOSIS — I70221 Atherosclerosis of native arteries of extremities with rest pain, right leg: Secondary | ICD-10-CM

## 2017-09-02 DIAGNOSIS — I96 Gangrene, not elsewhere classified: Secondary | ICD-10-CM | POA: Diagnosis present

## 2017-09-02 DIAGNOSIS — Z515 Encounter for palliative care: Secondary | ICD-10-CM | POA: Diagnosis present

## 2017-09-02 DIAGNOSIS — Z7982 Long term (current) use of aspirin: Secondary | ICD-10-CM

## 2017-09-02 DIAGNOSIS — I739 Peripheral vascular disease, unspecified: Secondary | ICD-10-CM | POA: Diagnosis not present

## 2017-09-02 DIAGNOSIS — Z8249 Family history of ischemic heart disease and other diseases of the circulatory system: Secondary | ICD-10-CM

## 2017-09-02 DIAGNOSIS — I493 Ventricular premature depolarization: Secondary | ICD-10-CM | POA: Diagnosis present

## 2017-09-02 DIAGNOSIS — E440 Moderate protein-calorie malnutrition: Secondary | ICD-10-CM

## 2017-09-02 HISTORY — PX: ENDARTERECTOMY FEMORAL: SHX5804

## 2017-09-02 HISTORY — DX: Benign prostatic hyperplasia without lower urinary tract symptoms: N40.0

## 2017-09-02 HISTORY — DX: Type 2 diabetes mellitus without complications: E11.9

## 2017-09-02 HISTORY — PX: AMPUTATION: SHX166

## 2017-09-02 HISTORY — DX: Dyspnea, unspecified: R06.00

## 2017-09-02 LAB — CBC
HCT: 32.8 % — ABNORMAL LOW (ref 39.0–52.0)
HEMATOCRIT: 27.1 % — AB (ref 39.0–52.0)
HEMOGLOBIN: 11.7 g/dL — AB (ref 13.0–17.0)
Hemoglobin: 9.7 g/dL — ABNORMAL LOW (ref 13.0–17.0)
MCH: 24.2 pg — AB (ref 26.0–34.0)
MCH: 24.1 pg — ABNORMAL LOW (ref 26.0–34.0)
MCHC: 35.7 g/dL (ref 30.0–36.0)
MCHC: 35.8 g/dL (ref 30.0–36.0)
MCV: 67.9 fL — ABNORMAL LOW (ref 78.0–100.0)
MCV: 67.2 fL — ABNORMAL LOW (ref 78.0–100.0)
PLATELETS: 238 10*3/uL (ref 150–400)
PLATELETS: 211 10*3/uL (ref 150–400)
RBC: 4.83 MIL/uL (ref 4.22–5.81)
RBC: 4.03 MIL/uL — ABNORMAL LOW (ref 4.22–5.81)
RDW: 17.2 % — AB (ref 11.5–15.5)
RDW: 17.3 % — AB (ref 11.5–15.5)
WBC: 7 10*3/uL (ref 4.0–10.5)
WBC: 5.2 10*3/uL (ref 4.0–10.5)

## 2017-09-02 LAB — GLUCOSE, CAPILLARY
Glucose-Capillary: 109 mg/dL — ABNORMAL HIGH (ref 65–99)
Glucose-Capillary: 181 mg/dL — ABNORMAL HIGH (ref 65–99)

## 2017-09-02 LAB — HEMOGLOBIN A1C
HEMOGLOBIN A1C: 7 % — AB (ref 4.8–5.6)
MEAN PLASMA GLUCOSE: 154.2 mg/dL

## 2017-09-02 LAB — PROTIME-INR
INR: 1.3
Prothrombin Time: 16 seconds — ABNORMAL HIGH (ref 11.4–15.2)

## 2017-09-02 LAB — COMPREHENSIVE METABOLIC PANEL
ALBUMIN: 3.2 g/dL — AB (ref 3.5–5.0)
ALK PHOS: 83 U/L (ref 38–126)
ALT: 10 U/L — ABNORMAL LOW (ref 17–63)
ANION GAP: 12 (ref 5–15)
AST: 19 U/L (ref 15–41)
BUN: 16 mg/dL (ref 6–20)
CALCIUM: 9.5 mg/dL (ref 8.9–10.3)
CO2: 24 mmol/L (ref 22–32)
Chloride: 99 mmol/L — ABNORMAL LOW (ref 101–111)
Creatinine, Ser: 0.96 mg/dL (ref 0.61–1.24)
GFR calc non Af Amer: >60 mL/min (ref >60)
GLUCOSE: 123 mg/dL — AB (ref 65–99)
Potassium: 3.7 mmol/L (ref 3.5–5.1)
SODIUM: 135 mmol/L (ref 135–145)
Total Bilirubin: 1 mg/dL (ref 0.3–1.2)
Total Protein: 6.7 g/dL (ref 6.5–8.1)

## 2017-09-02 LAB — SURGICAL PCR SCREEN
MRSA, PCR: NEGATIVE
Staphylococcus aureus: NEGATIVE

## 2017-09-02 LAB — APTT: APTT: 41 s — AB (ref 24–36)

## 2017-09-02 LAB — TYPE AND SCREEN
ABO/RH(D): B POS
ANTIBODY SCREEN: NEGATIVE

## 2017-09-02 LAB — CREATININE, SERUM
CREATININE: 0.76 mg/dL (ref 0.61–1.24)
GFR calc Af Amer: >60 mL/min (ref >60)

## 2017-09-02 SURGERY — ENDARTERECTOMY, FEMORAL
Anesthesia: General | Site: Groin | Laterality: Right

## 2017-09-02 MED ORDER — PHENYLEPHRINE HCL 10 MG/ML IJ SOLN
INTRAVENOUS | Status: DC | PRN
  Administered 2017-09-02: 25 ug/min via INTRAVENOUS

## 2017-09-02 MED ORDER — DEXTROSE 5 % IV SOLN
INTRAVENOUS | Status: AC
  Filled 2017-09-02: qty 1.5

## 2017-09-02 MED ORDER — ROCURONIUM BROMIDE 10 MG/ML (PF) SYRINGE
PREFILLED_SYRINGE | INTRAVENOUS | Status: DC | PRN
  Administered 2017-09-02: 50 mg via INTRAVENOUS
  Administered 2017-09-02: 20 mg via INTRAVENOUS

## 2017-09-02 MED ORDER — ONDANSETRON HCL 4 MG/2ML IJ SOLN
INTRAMUSCULAR | Status: AC
  Filled 2017-09-02: qty 4

## 2017-09-02 MED ORDER — POLYETHYLENE GLYCOL 3350 17 G PO PACK: 17.0000 g | PACK | Freq: Every day | ORAL | Status: DC | PRN

## 2017-09-02 MED ORDER — EPHEDRINE 5 MG/ML INJ
INTRAVENOUS | Status: AC
  Filled 2017-09-02: qty 20

## 2017-09-02 MED ORDER — FENTANYL CITRATE (PF) 250 MCG/5ML IJ SOLN
INTRAMUSCULAR | Status: AC
  Filled 2017-09-02: qty 5

## 2017-09-02 MED ORDER — PROTAMINE SULFATE 10 MG/ML IV SOLN
INTRAVENOUS | Status: AC
  Filled 2017-09-02: qty 5

## 2017-09-02 MED ORDER — SALINE SPRAY 0.65 % NA SOLN
1.0000 | NASAL | Status: DC | PRN
  Filled 2017-09-02: qty 44

## 2017-09-02 MED ORDER — MUPIROCIN 2 % EX OINT
1.0000 "application " | TOPICAL_OINTMENT | Freq: Once | CUTANEOUS | Status: AC
  Administered 2017-09-02: 1 via TOPICAL
  Filled 2017-09-02: qty 22

## 2017-09-02 MED ORDER — DEXAMETHASONE SODIUM PHOSPHATE 10 MG/ML IJ SOLN
INTRAMUSCULAR | Status: AC
  Filled 2017-09-02: qty 1

## 2017-09-02 MED ORDER — PHENYLEPHRINE 40 MCG/ML (10ML) SYRINGE FOR IV PUSH (FOR BLOOD PRESSURE SUPPORT)
PREFILLED_SYRINGE | INTRAVENOUS | Status: AC
  Filled 2017-09-02: qty 10

## 2017-09-02 MED ORDER — CHLORHEXIDINE GLUCONATE 4 % EX LIQD: 60.0000 mL | Freq: Once | CUTANEOUS | Status: DC

## 2017-09-02 MED ORDER — FENTANYL CITRATE (PF) 250 MCG/5ML IJ SOLN
INTRAMUSCULAR | Status: DC | PRN
  Administered 2017-09-02 (×4): 50 ug via INTRAVENOUS

## 2017-09-02 MED ORDER — ONDANSETRON HCL 4 MG/2ML IJ SOLN: 4.0000 mg | Freq: Four times a day (QID) | INTRAMUSCULAR | Status: DC | PRN

## 2017-09-02 MED ORDER — ETOMIDATE 2 MG/ML IV SOLN
INTRAVENOUS | Status: DC | PRN
  Administered 2017-09-02: 10 mg via INTRAVENOUS

## 2017-09-02 MED ORDER — HYDRALAZINE HCL 20 MG/ML IJ SOLN: 5.0000 mg | INTRAMUSCULAR | Status: DC | PRN

## 2017-09-02 MED ORDER — OXYCODONE-ACETAMINOPHEN 5-325 MG PO TABS: 1.0000 | ORAL_TABLET | ORAL | Status: DC | PRN

## 2017-09-02 MED ORDER — 0.9 % SODIUM CHLORIDE (POUR BTL) OPTIME
TOPICAL | Status: DC | PRN
  Administered 2017-09-02: 2000 mL

## 2017-09-02 MED ORDER — DEXAMETHASONE SODIUM PHOSPHATE 10 MG/ML IJ SOLN
INTRAMUSCULAR | Status: DC | PRN
  Administered 2017-09-02: 5 mg via INTRAVENOUS

## 2017-09-02 MED ORDER — INSULIN GLARGINE 100 UNIT/ML ~~LOC~~ SOLN
5.0000 [IU] | Freq: Every day | SUBCUTANEOUS | Status: DC
  Administered 2017-09-03 (×2): 5 [IU] via SUBCUTANEOUS
  Filled 2017-09-02 (×3): qty 0.05

## 2017-09-02 MED ORDER — PROTAMINE SULFATE 10 MG/ML IV SOLN
INTRAVENOUS | Status: DC | PRN
  Administered 2017-09-02: 10 mg via INTRAVENOUS
  Administered 2017-09-02: 30 mg via INTRAVENOUS

## 2017-09-02 MED ORDER — PHENOL 1.4 % MT LIQD: 1.0000 | OROMUCOSAL | Status: DC | PRN

## 2017-09-02 MED ORDER — TRAMADOL HCL 50 MG PO TABS
50.0000 mg | ORAL_TABLET | Freq: Two times a day (BID) | ORAL | Status: DC | PRN
  Administered 2017-09-02: 50 mg via ORAL
  Filled 2017-09-02: qty 1

## 2017-09-02 MED ORDER — LACTATED RINGERS IV SOLN
INTRAVENOUS | Status: DC | PRN
  Administered 2017-09-02 (×2): via INTRAVENOUS

## 2017-09-02 MED ORDER — SODIUM CHLORIDE 0.9 % IV SOLN
INTRAVENOUS | Status: DC
  Administered 2017-09-03 (×2): via INTRAVENOUS

## 2017-09-02 MED ORDER — METOPROLOL TARTRATE 5 MG/5ML IV SOLN: 2.0000 mg | INTRAVENOUS | Status: DC | PRN

## 2017-09-02 MED ORDER — GLUCERNA SHAKE PO LIQD
237.0000 mL | Freq: Two times a day (BID) | ORAL | Status: DC
  Administered 2017-09-03 – 2017-09-04 (×4): 237 mL via ORAL
  Filled 2017-09-02 (×6): qty 237

## 2017-09-02 MED ORDER — ATORVASTATIN CALCIUM 80 MG PO TABS
80.0000 mg | ORAL_TABLET | Freq: Every day | ORAL | Status: DC
  Administered 2017-09-03: 80 mg via ORAL
  Filled 2017-09-02: qty 1

## 2017-09-02 MED ORDER — HYDROMORPHONE HCL 1 MG/ML IJ SOLN
0.2500 mg | INTRAMUSCULAR | Status: DC | PRN
  Administered 2017-09-02: 0.25 mg via INTRAVENOUS

## 2017-09-02 MED ORDER — PHENYLEPHRINE HCL 10 MG/ML IJ SOLN
INTRAMUSCULAR | Status: DC | PRN
  Administered 2017-09-02 (×3): 80 ug via INTRAVENOUS
  Administered 2017-09-02: 160 ug via INTRAVENOUS

## 2017-09-02 MED ORDER — PANTOPRAZOLE SODIUM 40 MG PO TBEC
40.0000 mg | DELAYED_RELEASE_TABLET | Freq: Every day | ORAL | Status: DC
  Administered 2017-09-03 – 2017-09-04 (×2): 40 mg via ORAL
  Filled 2017-09-02 (×2): qty 1

## 2017-09-02 MED ORDER — SPIRONOLACTONE 12.5 MG HALF TABLET
12.5000 mg | ORAL_TABLET | Freq: Every day | ORAL | Status: DC
  Administered 2017-09-03 – 2017-09-04 (×2): 12.5 mg via ORAL
  Filled 2017-09-02 (×2): qty 1

## 2017-09-02 MED ORDER — HEPARIN SODIUM (PORCINE) 1000 UNIT/ML IJ SOLN
INTRAMUSCULAR | Status: AC
  Filled 2017-09-02: qty 2

## 2017-09-02 MED ORDER — ASPIRIN EC 81 MG PO TBEC
81.0000 mg | DELAYED_RELEASE_TABLET | Freq: Every day | ORAL | Status: DC
  Administered 2017-09-03 – 2017-09-04 (×2): 81 mg via ORAL
  Filled 2017-09-02 (×2): qty 1

## 2017-09-02 MED ORDER — DEXTROSE 5 % IV SOLN
1.5000 g | INTRAVENOUS | Status: AC
  Administered 2017-09-02: 1.5 g via INTRAVENOUS

## 2017-09-02 MED ORDER — INSULIN ASPART 100 UNIT/ML ~~LOC~~ SOLN
5.0000 [IU] | Freq: Every day | SUBCUTANEOUS | Status: DC
  Administered 2017-09-03: 5 [IU] via SUBCUTANEOUS

## 2017-09-02 MED ORDER — LIDOCAINE 2% (20 MG/ML) 5 ML SYRINGE
INTRAMUSCULAR | Status: AC
  Filled 2017-09-02: qty 10

## 2017-09-02 MED ORDER — HEPARIN SODIUM (PORCINE) 1000 UNIT/ML IJ SOLN
INTRAMUSCULAR | Status: DC | PRN
  Administered 2017-09-02: 7000 [IU] via INTRAVENOUS

## 2017-09-02 MED ORDER — LABETALOL HCL 5 MG/ML IV SOLN: 10.0000 mg | INTRAVENOUS | Status: DC | PRN

## 2017-09-02 MED ORDER — ALUM & MAG HYDROXIDE-SIMETH 200-200-20 MG/5ML PO SUSP: 15.0000 mL | ORAL | Status: DC | PRN

## 2017-09-02 MED ORDER — QUETIAPINE FUMARATE 25 MG PO TABS
25.0000 mg | ORAL_TABLET | Freq: Two times a day (BID) | ORAL | Status: DC
  Administered 2017-09-03 – 2017-09-04 (×4): 25 mg via ORAL
  Filled 2017-09-02 (×5): qty 1

## 2017-09-02 MED ORDER — SENNA 8.6 MG PO TABS
2.0000 | ORAL_TABLET | Freq: Two times a day (BID) | ORAL | Status: DC
  Administered 2017-09-03 – 2017-09-04 (×4): 17.2 mg via ORAL
  Filled 2017-09-02 (×4): qty 2

## 2017-09-02 MED ORDER — SUGAMMADEX SODIUM 200 MG/2ML IV SOLN
INTRAVENOUS | Status: DC | PRN
  Administered 2017-09-02: 200 mg via INTRAVENOUS

## 2017-09-02 MED ORDER — HEPARIN SODIUM (PORCINE) 5000 UNIT/ML IJ SOLN
5000.0000 [IU] | Freq: Three times a day (TID) | INTRAMUSCULAR | Status: DC
  Administered 2017-09-03 – 2017-09-04 (×5): 5000 [IU] via SUBCUTANEOUS
  Filled 2017-09-02 (×5): qty 1

## 2017-09-02 MED ORDER — SODIUM CHLORIDE 0.9 % IV SOLN: INTRAVENOUS | Status: DC

## 2017-09-02 MED ORDER — MORPHINE SULFATE (PF) 4 MG/ML IV SOLN
4.0000 mg | INTRAVENOUS | Status: DC | PRN
Start: 2017-09-02 — End: 2017-09-04

## 2017-09-02 MED ORDER — LACTATED RINGERS IV SOLN
INTRAVENOUS | Status: DC
  Administered 2017-09-02: 10:00:00 via INTRAVENOUS

## 2017-09-02 MED ORDER — DEXTROSE 5 % IV SOLN
1.5000 g | Freq: Two times a day (BID) | INTRAVENOUS | Status: DC
  Administered 2017-09-03 – 2017-09-04 (×4): 1.5 g via INTRAVENOUS
  Filled 2017-09-02 (×5): qty 1.5

## 2017-09-02 MED ORDER — POTASSIUM CHLORIDE CRYS ER 20 MEQ PO TBCR
20.0000 meq | EXTENDED_RELEASE_TABLET | Freq: Once | ORAL | Status: DC
  Filled 2017-09-02: qty 1

## 2017-09-02 MED ORDER — FUROSEMIDE 40 MG PO TABS
40.0000 mg | ORAL_TABLET | Freq: Every day | ORAL | Status: DC
  Administered 2017-09-03 – 2017-09-04 (×2): 40 mg via ORAL
  Filled 2017-09-02 (×2): qty 1

## 2017-09-02 MED ORDER — ROCURONIUM BROMIDE 10 MG/ML (PF) SYRINGE
PREFILLED_SYRINGE | INTRAVENOUS | Status: AC
  Filled 2017-09-02: qty 10

## 2017-09-02 MED ORDER — DIGOXIN 125 MCG PO TABS
0.1250 mg | ORAL_TABLET | Freq: Every day | ORAL | Status: DC
  Administered 2017-09-03 – 2017-09-04 (×2): 0.125 mg via ORAL
  Filled 2017-09-02 (×2): qty 1

## 2017-09-02 MED ORDER — ETOMIDATE 2 MG/ML IV SOLN
INTRAVENOUS | Status: AC
  Filled 2017-09-02: qty 10

## 2017-09-02 MED ORDER — TRAMADOL HCL 50 MG PO TABS
ORAL_TABLET | ORAL | Status: AC
  Filled 2017-09-02: qty 1

## 2017-09-02 MED ORDER — ENSURE ENLIVE PO LIQD
237.0000 mL | Freq: Two times a day (BID) | ORAL | Status: DC
  Administered 2017-09-03: 237 mL via ORAL

## 2017-09-02 MED ORDER — SODIUM CHLORIDE 0.9 % IV SOLN
INTRAVENOUS | Status: DC | PRN
  Administered 2017-09-02: 11:00:00

## 2017-09-02 MED ORDER — ONDANSETRON HCL 4 MG/2ML IJ SOLN
INTRAMUSCULAR | Status: DC | PRN
  Administered 2017-09-02: 4 mg via INTRAVENOUS

## 2017-09-02 MED ORDER — EPHEDRINE SULFATE 50 MG/ML IJ SOLN
INTRAMUSCULAR | Status: DC | PRN
  Administered 2017-09-02 (×2): 10 mg via INTRAVENOUS
  Administered 2017-09-02: 5 mg via INTRAVENOUS

## 2017-09-02 MED ORDER — GUAIFENESIN-DM 100-10 MG/5ML PO SYRP: 15.0000 mL | ORAL_SOLUTION | ORAL | Status: DC | PRN

## 2017-09-02 MED ORDER — SUGAMMADEX SODIUM 200 MG/2ML IV SOLN
INTRAVENOUS | Status: AC
  Filled 2017-09-02: qty 2

## 2017-09-02 MED ORDER — HYDROMORPHONE HCL 1 MG/ML IJ SOLN
INTRAMUSCULAR | Status: AC
  Filled 2017-09-02: qty 1

## 2017-09-02 SURGICAL SUPPLY — 68 items
BANDAGE ACE 4X5 VEL STRL LF (GAUZE/BANDAGES/DRESSINGS) IMPLANT
BANDAGE ESMARK 6X9 LF (GAUZE/BANDAGES/DRESSINGS) IMPLANT
BNDG ESMARK 6X9 LF (GAUZE/BANDAGES/DRESSINGS)
BNDG GAUZE ELAST 4 BULKY (GAUZE/BANDAGES/DRESSINGS) ×5 IMPLANT
CANISTER SUCT 3000ML PPV (MISCELLANEOUS) ×5 IMPLANT
CLIP VESOCCLUDE MED 24/CT (CLIP) ×5 IMPLANT
CLIP VESOCCLUDE SM WIDE 24/CT (CLIP) ×5 IMPLANT
CONT SPEC 4OZ CLIKSEAL STRL BL (MISCELLANEOUS) ×5 IMPLANT
COVER PROBE W GEL 5X96 (DRAPES) ×5 IMPLANT
COVER SURGICAL LIGHT HANDLE (MISCELLANEOUS) ×5 IMPLANT
CUFF TOURNIQUET SINGLE 18IN (TOURNIQUET CUFF) IMPLANT
CUFF TOURNIQUET SINGLE 24IN (TOURNIQUET CUFF) IMPLANT
CUFF TOURNIQUET SINGLE 34IN LL (TOURNIQUET CUFF) IMPLANT
CUFF TOURNIQUET SINGLE 44IN (TOURNIQUET CUFF) IMPLANT
DERMABOND ADVANCED (GAUZE/BANDAGES/DRESSINGS) ×2
DERMABOND ADVANCED .7 DNX12 (GAUZE/BANDAGES/DRESSINGS) ×3 IMPLANT
DRAIN CHANNEL 15F RND FF W/TCR (WOUND CARE) IMPLANT
DRAPE C-ARM 42X72 X-RAY (DRAPES) IMPLANT
DRAPE EXTREMITY T 121X128X90 (DRAPE) IMPLANT
DRAPE HALF SHEET 40X57 (DRAPES) ×5 IMPLANT
DRSG EMULSION OIL 3X3 NADH (GAUZE/BANDAGES/DRESSINGS) IMPLANT
ELECT REM PT RETURN 9FT ADLT (ELECTROSURGICAL) ×5
ELECTRODE REM PT RTRN 9FT ADLT (ELECTROSURGICAL) ×3 IMPLANT
EVACUATOR SILICONE 100CC (DRAIN) IMPLANT
GAUZE SPONGE 4X4 12PLY STRL (GAUZE/BANDAGES/DRESSINGS) ×5 IMPLANT
GLOVE BIO SURGEON STRL SZ7 (GLOVE) ×20 IMPLANT
GLOVE BIOGEL PI IND STRL 6.5 (GLOVE) ×18 IMPLANT
GLOVE BIOGEL PI IND STRL 7.5 (GLOVE) ×6 IMPLANT
GLOVE BIOGEL PI INDICATOR 6.5 (GLOVE) ×12
GLOVE BIOGEL PI INDICATOR 7.5 (GLOVE) ×4
GLOVE SURG SS PI 7.0 STRL IVOR (GLOVE) ×5 IMPLANT
GOWN STRL REUS W/ TWL LRG LVL3 (GOWN DISPOSABLE) ×6 IMPLANT
GOWN STRL REUS W/ TWL XL LVL3 (GOWN DISPOSABLE) ×6 IMPLANT
GOWN STRL REUS W/TWL LRG LVL3 (GOWN DISPOSABLE) ×4
GOWN STRL REUS W/TWL XL LVL3 (GOWN DISPOSABLE) ×4
HEMOSTAT SPONGE AVITENE ULTRA (HEMOSTASIS) ×5 IMPLANT
KIT BASIN OR (CUSTOM PROCEDURE TRAY) ×5 IMPLANT
KIT ROOM TURNOVER OR (KITS) ×5 IMPLANT
MARKER GRAFT CORONARY BYPASS (MISCELLANEOUS) IMPLANT
NS IRRIG 1000ML POUR BTL (IV SOLUTION) ×10 IMPLANT
PACK GENERAL/GYN (CUSTOM PROCEDURE TRAY) ×5 IMPLANT
PACK PERIPHERAL VASCULAR (CUSTOM PROCEDURE TRAY) ×5 IMPLANT
PAD ARMBOARD 7.5X6 YLW CONV (MISCELLANEOUS) ×10 IMPLANT
PATCH VASC XENOSURE 1CMX6CM (Vascular Products) ×2 IMPLANT
PATCH VASC XENOSURE 1X6 (Vascular Products) ×3 IMPLANT
SPONGE INTESTINAL PEANUT (DISPOSABLE) ×5 IMPLANT
STOPCOCK 4 WAY LG BORE MALE ST (IV SETS) IMPLANT
SUT ETHILON 3 0 PS 1 (SUTURE) ×5 IMPLANT
SUT MNCRL AB 4-0 PS2 18 (SUTURE) ×5 IMPLANT
SUT PROLENE 5 0 C 1 24 (SUTURE) ×5 IMPLANT
SUT PROLENE 6 0 BV (SUTURE) ×20 IMPLANT
SUT PROLENE 7 0 BV 1 (SUTURE) IMPLANT
SUT PROLENE 7 0 BV1 MDA (SUTURE) ×5 IMPLANT
SUT SILK 2 0 PERMA HAND 18 BK (SUTURE) IMPLANT
SUT SILK 2 0 SH (SUTURE) IMPLANT
SUT SILK 3 0 (SUTURE)
SUT SILK 3-0 18XBRD TIE 12 (SUTURE) IMPLANT
SUT VIC AB 2-0 CT1 27 (SUTURE) ×2
SUT VIC AB 2-0 CT1 TAPERPNT 27 (SUTURE) ×3 IMPLANT
SUT VIC AB 3-0 SH 27 (SUTURE) ×2
SUT VIC AB 3-0 SH 27X BRD (SUTURE) ×3 IMPLANT
SWAB CULTURE LIQ STUART DBL (MISCELLANEOUS) ×5 IMPLANT
SWAB CULTURE LIQUID MINI MALE (MISCELLANEOUS) ×5 IMPLANT
TOWEL GREEN STERILE (TOWEL DISPOSABLE) ×10 IMPLANT
TRAY FOLEY W/METER SILVER 16FR (SET/KITS/TRAYS/PACK) IMPLANT
TUBING EXTENTION W/L.L. (IV SETS) ×5 IMPLANT
UNDERPAD 30X30 (UNDERPADS AND DIAPERS) ×5 IMPLANT
WATER STERILE IRR 1000ML POUR (IV SOLUTION) ×5 IMPLANT

## 2017-09-02 NOTE — Anesthesia Procedure Notes (Signed)
Arterial Line Insertion Start/End1/15/2019 10:15 AM, 09/02/2017 10:26 AM Performed by: Adair LaundryPaxton, Lakira Ogando A, CRNA, CRNA  Patient location: Pre-op. Preanesthetic checklist: patient identified, IV checked, risks and benefits discussed, surgical consent, monitors and equipment checked and pre-op evaluation Lidocaine 1% used for infiltration Left, radial was placed Catheter size: 20 G Hand hygiene performed  and maximum sterile barriers used   Attempts: 1 Procedure performed without using ultrasound guided technique. Following insertion, Biopatch and dressing applied. Post procedure assessment: normal  Patient tolerated the procedure well with no immediate complications.

## 2017-09-02 NOTE — Op Note (Signed)
OPERATIVE NOTE   PROCEDURE: 1.  Right iliofemoral endarterectomy with bovine patch angioplasty 2.  Right open third toe amputation  PRE-OPERATIVE DIAGNOSIS: right leg rest pain with sub-total occlusion of right proximal common femoral artery and right third toe gangrene  POST-OPERATIVE DIAGNOSIS: same as above   SURGEON: Leonides SakeBrian Proctor Carriker, MD  ASSISTANT(S): Emilie RutterMatthew Eveland, PAC   ANESTHESIA: general  ESTIMATED BLOOD LOSS: 50 cc  FINDING(S): 1.  Exophytic plaque in proximal common femoral artery occupying 75-90% of lumen 2.  No bleeding from right foot toe amputation site 3.  Necrotic tissue extending into mid-foot with liquefied fat vs pus  SPECIMEN(S):  Anaerobic and aerobic cultures of right mid-foot  INDICATIONS:   [name] is a 72 y.o. male who presents with right leg rest pain and right third toe gangrene.  I recommended: Right iliofemoral endarterectomy with bovine patch angioplasty, possible Right fem-tib bypass, and Righ 3rd toe amputation.  The risk, benefits, and alternative for bypass operations were discussed with the patient.  The patient is aware the risks include but are not limited to: bleeding, infection, myocardial infarction, stroke, limb loss, nerve damage, limb edema, need for additional procedures in the future, wound complications, and inability to complete the bypass.  The patient is aware of these risks and agreed to proceed.   DESCRIPTION: Prior to signing the consent this AM, I detailed my conversation with this patient's cardiology team.  He is considered high risk by the cardiologist and the anesthesia team expressed concerns with his ability to survive a femorotibial bypass.  I discussed with the patient and family doing the endarterectomy and right third toe amputation and avoiding the femorotibial bypass to lower his cardiac risk.  After obtaining full informed written consent, the patient was brought back to the operating room and placed supine  upon the operating table.  The patient received IV antibiotics prior to induction.  A procedure time out was completed and the correct surgical site was verified.  After obtaining adequate anesthesia, the patient was prepped and draped in the standard fashion for: right femorotibial bypass.    Using a Sonosite, I identified the right common femoral artery.  I made an incision over the right common femoral artery with a 10-blade.  I dissected through the subcutaneous tissue down to the artery.  Interestingly there was a couple of anterior branches off the common femoral artery that required ligation to fully expose the common femoral artery.  I dissected out the common femoral artery from the inguinal ligament down to the femoral bifurcation.  The palpable lesion felt to be in the proximal common femoral artery, so I dissected out the distal external iliac artery.  In this process, I had to ligate the crossing veins.    The patient was given 7000 units of Heparin intravenously, which was a therapeutic bolus. After waiting three minutes, I clamped the distal external iliac artery, superficial femoral artery and placed the profunda femoral artery under tension.  I made an arteriotomy in the common femoral artery and extended the arteriotomy proximally and distally.  Immediately it became evident that there was a large exophytic lesion in the proximal common femoral artery.  To gain better control I elected extend the arteriotomy in to the distal external iliac artery.  I moved the external iliac artery clamp another 2 cm more proximal.  I extended the arteriotomy proximally.  Using a Penfield, I completed an endarterectomy starting in the proximal common femoral artery and extending proximally and  distally.  I was able to completely extract the exophytic plaque.  I terminated the endarterectomy distally proximal to the femoral bifurcation.  I spent another 15 minutes removing residual intimal flaps.  The residual  disease was densely adherent to the wall and could not be removed without damaging the wall.  I tacked down the posterior wall plaque in the distal common femoral artery with interrupted 7-0 Prolene stitches.    I tested the iliofemoral artery with heparinized saline and no loose intimal or flaps were evident.  I fashioned a previously soaked bovine pericardial patch for the geometry of this arteriotomy.  I sewed the patch to the right iliofemoral artery with two running stitches of 6-0 Prolene from each end of the artery.  I tied these two stitches together.  Prior to completing this bovine patch angioplasty, I bled the external iliac artery.  There was excellent bleeding.  There was vigorous bleeding from both the superficial femoral artery and profunda femoral artery.  I washed out the lumen and completed the bovine patch angioplasty in the usual fashion.  The right groin was packed with Avitene and a couple of areas in the suture line repaired with a 6-0 Prolene stitches.  After waiting a few minutes, bleeding stopped.  The patient was given 40 mg of Protamine to reverse anticoagulation.  The right groin was repaired with a double layer of 2-0 Vicryl, followed by a double layer of 3-0 Vicryl, and followed by a running subcuticular stitch of 4-0 Monocryl.  The skin was cleaned, dried, and reinforced with Dermabond.  I then turned my attention to the right foot.  I made a racquet incision around the gangrenous right third toe.  There was no bleeding from the skin incision.  I sharply released the tendons on the proximal phalange and released the entire third toe.  In this process, I found was either pus or liquefied fat in the mid-foot.  I took anaerobic and aerobic specimens of this wound.  Due to the ischemic tissue, I felt this wound would not heal.  I packed this wound with a wet-to-dry dressing of moistened 4x4 and wrapped the right foot with Kerlix.  Based on the intraoperative findings, I suspect  the patient is going to need an right above-knee amputation, which the patient did NOT give consent for such.   COMPLICATIONS: none  CONDITION: stable   Leonides Sake, MD, [facility] [place] Office: [phone] Pager: [phone]  09/02/2017, 1:06 PM

## 2017-09-02 NOTE — Progress Notes (Signed)
Care of pt assumed by MA Mahamud Metts RN 

## 2017-09-02 NOTE — Anesthesia Postprocedure Evaluation (Signed)
Anesthesia Post Note  Patient: Nurse, adultssa Jacuinde  Procedure(s) Performed: Glenetta BorgENDARTERECTOMY ILLIO-FEMORAL WITH XENOSURE BIOLOGIC PATCH ANGIOPLASTY RIGHT (Right Groin) OPEN AMPUTATION DIGIT RIGHT THIRD TOE (Right Foot)     Patient location during evaluation: PACU Anesthesia Type: General Level of consciousness: awake and alert Pain management: pain level controlled Vital Signs Assessment: post-procedure vital signs reviewed and stable Respiratory status: spontaneous breathing, nonlabored ventilation and respiratory function stable Cardiovascular status: blood pressure returned to baseline and stable Postop Assessment: no apparent nausea or vomiting Anesthetic complications: no    Last Vitals:  Vitals:   09/02/17 1346 09/02/17 1423  BP: (!) 112/48 (P) 108/77  Pulse: 97 (!) (P) 105  Resp:  (P) 18  Temp:    SpO2: 98% (P) 95%    Last Pain:  Vitals:   09/02/17 0931  TempSrc:   PainSc: 2                  Tishawna Larouche,W. EDMOND

## 2017-09-02 NOTE — Interval H&P Note (Signed)
   History and Physical Update  The patient was interviewed and re-examined.  The patient's previous History and Physical has been reviewed and is unchanged from my consult except for: interval conversation with Cardiology.  Pt has severe cardiomyopathy with EF 15-20%.  Pt is risk stratified high risk.  I discussed with this patient a fem-tib bypass would likely extend the procedure another 2-3 hours minimally, so the risks for death were significantly increased.  After further discussion, we agree to proceeding with: R iliofemoral endarterectomy with bovine patch angioplasty, amputation of right 3rd toe.  If this is still adequate for pain control, would proceed with right AKA in the future.  This patient's popliteal occlusion was CHRONIC and not embolic in nature.  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   Leonides SakeBrian Jakeya Gherardi, MD, FACS [facility] [place] Office: [phone] Pager: [phone]  09/02/2017, 10:03 AM

## 2017-09-02 NOTE — Anesthesia Procedure Notes (Signed)
Procedure Name: Intubation Date/Time: 09/02/2017 10:53 AM Performed by: Bryson Corona, CRNA Pre-anesthesia Checklist: Patient identified, Emergency Drugs available, Suction available and Patient being monitored Patient Re-evaluated:Patient Re-evaluated prior to induction Oxygen Delivery Method: Circle System Utilized Preoxygenation: Pre-oxygenation with 100% oxygen Induction Type: IV induction Ventilation: Mask ventilation without difficulty Laryngoscope Size: Mac and 3 Grade View: Grade I Tube type: Oral Number of attempts: 1 Airway Equipment and Method: Stylet and Oral airway Placement Confirmation: ETT inserted through vocal cords under direct vision,  positive ETCO2 and breath sounds checked- equal and bilateral Secured at: 21 cm Tube secured with: Tape Dental Injury: Teeth and Oropharynx as per pre-operative assessment  Comments: TMD >3. Would use MAC 4 in future.

## 2017-09-02 NOTE — Progress Notes (Signed)
  Day of Surgery Note    Subjective:  Pt seen in pacu-doing well with no complaints   Vitals:   09/02/17 1346 09/02/17 1423  BP: (!) 112/48 (P) 108/77  Pulse: 97 (!) (P) 105  Resp:  (P) 18  Temp:    SpO2: 98% (P) 95%    Incisions:   Right groin is clean and dry without hematoma; right foot bandage is clean and dry Extremities:  Monophasic right DP and faint PT doppler signal Lungs:  Non labored    Assessment/Plan:  This is a 72 y.o. male who is s/p  1.  Right iliofemoral endarterectomy with bovine patch angioplasty 2.  Right open third toe amputation    -pt doing well in pacu with Monophasic right DP and faint PT doppler signal -to 4 east when bed available   Doreatha MassedSamantha Savreen Gebhardt, PA-C 09/02/2017 3:08 PM [phone]

## 2017-09-02 NOTE — Transfer of Care (Signed)
Immediate Anesthesia Transfer of Care Note  Patient: Gardiner RhymeIssa Carusone  Procedure(s) Performed: Glenetta BorgENDARTERECTOMY ILLIO-FEMORAL WITH XENOSURE BIOLOGIC PATCH ANGIOPLASTY RIGHT (Right Groin) OPEN AMPUTATION DIGIT RIGHT THIRD TOE (Right Foot)  Patient Location: PACU  Anesthesia Type:General  Level of Consciousness: drowsy  Airway & Oxygen Therapy: Patient Spontanous Breathing and Patient connected to face mask oxygen  Post-op Assessment: Report given to RN and Post -op Vital signs reviewed and stable  Post vital signs: Reviewed and stable  Last Vitals:  Vitals:   09/02/17 0859  BP: 101/71  Pulse: 98  Resp: 18  Temp: 36.8 C  SpO2: 100%    Last Pain:  Vitals:   09/02/17 0931  TempSrc:   PainSc: 2       Patients Stated Pain Goal: 5 (09/02/17 0931)  Complications: No apparent anesthesia complications

## 2017-09-02 NOTE — Anesthesia Preprocedure Evaluation (Addendum)
Anesthesia Evaluation  Patient identified by MRN, date of birth, ID band Patient awake    Reviewed: Allergy & Precautions, H&P , NPO status , Patient's Chart, lab work & pertinent test results  Airway Mallampati: II  TM Distance: >3 FB Neck ROM: Full    Dental no notable dental hx. (+) Teeth Intact, Dental Advisory Given   Pulmonary neg pulmonary ROS,    Pulmonary exam normal breath sounds clear to auscultation       Cardiovascular hypertension, Pt. on medications and Pt. on home beta blockers + CAD, + CABG, + Peripheral Vascular Disease and +CHF   Rhythm:Regular Rate:Normal     Neuro/Psych CVA negative psych ROS   GI/Hepatic negative GI ROS, Neg liver ROS,   Endo/Other  diabetes, Insulin Dependent  Renal/GU negative Renal ROS  negative genitourinary   Musculoskeletal   Abdominal   Peds  Hematology negative hematology ROS (+) anemia ,   Anesthesia Other Findings   Reproductive/Obstetrics negative OB ROS                            Anesthesia Physical Anesthesia Plan  ASA: IV  Anesthesia Plan: General   Post-op Pain Management:    Induction: Intravenous  PONV Risk Score and Plan: 3 and Ondansetron, Dexamethasone and Treatment may vary due to age or medical condition  Airway Management Planned: Oral ETT  Additional Equipment: Arterial line  Intra-op Plan:   Post-operative Plan: Extubation in OR  Informed Consent: I have reviewed the patients History and Physical, chart, labs and discussed the procedure including the risks, benefits and alternatives for the proposed anesthesia with the patient or authorized representative who has indicated his/her understanding and acceptance.   Dental advisory given  Plan Discussed with: CRNA  Anesthesia Plan Comments:         Anesthesia Quick Evaluation

## 2017-09-03 ENCOUNTER — Encounter (HOSPITAL_COMMUNITY): Payer: Self-pay | Admitting: Vascular Surgery

## 2017-09-03 ENCOUNTER — Telehealth: Payer: Self-pay | Admitting: Family Medicine

## 2017-09-03 LAB — URINALYSIS, ROUTINE W REFLEX MICROSCOPIC
Bilirubin Urine: NEGATIVE
Glucose, UA: NEGATIVE mg/dL
Hgb urine dipstick: NEGATIVE
KETONES UR: 5 mg/dL — AB
Leukocytes, UA: NEGATIVE
Nitrite: NEGATIVE
PROTEIN: 30 mg/dL — AB
Specific Gravity, Urine: 1.029 (ref 1.005–1.030)
pH: 5 (ref 5.0–8.0)

## 2017-09-03 LAB — CBC
HEMATOCRIT: 29 % — AB (ref 39.0–52.0)
HEMOGLOBIN: 10.5 g/dL — AB (ref 13.0–17.0)
MCH: 24.3 pg — ABNORMAL LOW (ref 26.0–34.0)
MCHC: 36.2 g/dL — AB (ref 30.0–36.0)
MCV: 67.1 fL — ABNORMAL LOW (ref 78.0–100.0)
Platelets: 236 10*3/uL (ref 150–400)
RBC: 4.32 MIL/uL (ref 4.22–5.81)
RDW: 16.7 % — ABNORMAL HIGH (ref 11.5–15.5)
WBC: 8 10*3/uL (ref 4.0–10.5)

## 2017-09-03 LAB — COMPREHENSIVE METABOLIC PANEL
ALT: 8 U/L — AB (ref 17–63)
AST: 13 U/L — AB (ref 15–41)
Albumin: 3 g/dL — ABNORMAL LOW (ref 3.5–5.0)
Alkaline Phosphatase: 74 U/L (ref 38–126)
Anion gap: 10 (ref 5–15)
BILIRUBIN TOTAL: 0.8 mg/dL (ref 0.3–1.2)
BUN: 18 mg/dL (ref 6–20)
CO2: 23 mmol/L (ref 22–32)
CREATININE: 0.84 mg/dL (ref 0.61–1.24)
Calcium: 9 mg/dL (ref 8.9–10.3)
Chloride: 102 mmol/L (ref 101–111)
Glucose, Bld: 117 mg/dL — ABNORMAL HIGH (ref 65–99)
Potassium: 3.7 mmol/L (ref 3.5–5.1)
Sodium: 135 mmol/L (ref 135–145)
Total Protein: 6.1 g/dL — ABNORMAL LOW (ref 6.5–8.1)

## 2017-09-03 LAB — GLUCOSE, CAPILLARY
Glucose-Capillary: 150 mg/dL — ABNORMAL HIGH (ref 65–99)
Glucose-Capillary: 173 mg/dL — ABNORMAL HIGH (ref 65–99)
Glucose-Capillary: 219 mg/dL — ABNORMAL HIGH (ref 65–99)
Glucose-Capillary: 84 mg/dL (ref 65–99)

## 2017-09-03 LAB — PROTIME-INR
INR: 1.25
PROTHROMBIN TIME: 15.5 s — AB (ref 11.4–15.2)

## 2017-09-03 MED ORDER — INSULIN ASPART 100 UNIT/ML ~~LOC~~ SOLN: 0.0000 [IU] | Freq: Every day | SUBCUTANEOUS | Status: DC

## 2017-09-03 MED ORDER — ADULT MULTIVITAMIN W/MINERALS CH
1.0000 | ORAL_TABLET | Freq: Every day | ORAL | Status: DC
  Administered 2017-09-03 – 2017-09-04 (×2): 1 via ORAL
  Filled 2017-09-03 (×2): qty 1

## 2017-09-03 MED ORDER — INSULIN ASPART 100 UNIT/ML ~~LOC~~ SOLN
0.0000 [IU] | Freq: Three times a day (TID) | SUBCUTANEOUS | Status: DC
  Administered 2017-09-03 – 2017-09-04 (×3): 2 [IU] via SUBCUTANEOUS

## 2017-09-03 NOTE — Progress Notes (Signed)
Pt c/o unable to void feels like he needs to go but can't, in and out cath for 250cc of amber urine.

## 2017-09-03 NOTE — Progress Notes (Signed)
Orthopedic Tech Progress Note Patient Details:  [name] 08-Feb-1946 [id number]  Ortho Devices Type of Ortho Device: Darco shoe Ortho Device/Splint Location: RLE Ortho Device/Splint Interventions: Devin DoffingOrdered       [name] 09/03/2017, 4:32 PM

## 2017-09-03 NOTE — Telephone Encounter (Signed)
Noted  

## 2017-09-03 NOTE — Plan of Care (Signed)
Continue current care plan 

## 2017-09-03 NOTE — Care Management Note (Signed)
Case Management Note  Patient Details  Name: Gardiner Rhymessa Bossard MRN: [id number] Date of Birth: [birth year]  Subjective/Objective:   Pt admitted on 09/02/17 s/p RT iliofemoral endarterectomy with bovine patch angioplasty and amputation of RT 3rd toe.  PTA, pt resided at home with family members.  [facility]                   Action/Plan: [facility] aware of admission.  Admission not related to hospice diagnosis.  Pt plans to dc home with continuation of home hospice services at dc.  Will follow.    Expected Discharge Date:                  Expected Discharge Plan:  Home w Hospice Care  In-House Referral:     Discharge planning Services  CM Consult  Post Acute Care Choice:    Choice offered to:     DME Arranged:    DME Agency:     HH Arranged:    HH Agency:     Status of Service:  In process, will continue to follow  If discussed at Long Length of Stay Meetings, dates discussed:    Additional Comments:  Glennon Macmerson, Posie Lillibridge M, RN 09/03/2017, 10:19 AM

## 2017-09-03 NOTE — Progress Notes (Signed)
Home Care SW made visit to Patient, no family present.  Patient was sitting up in bed leaning forward, he does not speak [place] and SW was not able to engage in conversation.  SW reviewed chart and will continue to follow and assist with discharge planning as necessary.  Riki SheerMadara Shillingalw, LCSW [facility] [phone]

## 2017-09-03 NOTE — Progress Notes (Addendum)
Progress Note    09/03/2017 8:41 AM 1 Day Post-Op  Subjective:  Non conversational during exam but endorses pain to R foot   Vitals:   09/03/17 0430 09/03/17 0727  BP: 128/76 (!) 84/59  Pulse: 82 92  Resp: 20 15  Temp: 97.8 F (36.6 C) (!) 97.5 F (36.4 C)  SpO2: 100% 100%   Physical Exam: Lungs:  Non labored Incisions:  R groin incision intact no active bleeding or hematoma Extremities:  R 3rd toe amp site with sanguinous collection on dressing, unchanged wound bed from OR; peroneal and AT monophasic by doppler Neurologic: alert  CBC    Component Value Date/Time   WBC 5.2 09/02/2017 2145   RBC 4.03 (L) 09/02/2017 2145   HGB 9.7 (L) 09/02/2017 2145   HCT 27.1 (L) 09/02/2017 2145   PLT 211 09/02/2017 2145   MCV 67.2 (L) 09/02/2017 2145   MCH 24.1 (L) 09/02/2017 2145   MCHC 35.8 09/02/2017 2145   RDW 17.3 (H) 09/02/2017 2145   LYMPHSABS 2.1 07/07/2017 0444   MONOABS 0.8 07/07/2017 0444   EOSABS 0.2 07/07/2017 0444   BASOSABS 0.0 07/07/2017 0444    BMET    Component Value Date/Time   NA 135 09/02/2017 0841   NA 120 (L) 06/02/2017 1026   K 3.7 09/02/2017 0841   CL 99 (L) 09/02/2017 0841   CO2 24 09/02/2017 0841   GLUCOSE 123 (H) 09/02/2017 0841   BUN 16 09/02/2017 0841   BUN 17 06/02/2017 1026   CREATININE 0.76 09/02/2017 2145   CALCIUM 9.5 09/02/2017 0841   GFRNONAA >60 09/02/2017 2145   GFRAA >60 09/02/2017 2145    INR    Component Value Date/Time   INR 1.30 09/02/2017 0841     Intake/Output Summary (Last 24 hours) at 09/03/2017 0841 Last data filed at 09/03/2017 0500 Gross per 24 hour  Intake 1425 ml  Output 225 ml  Net 1200 ml     Assessment/Plan:  72 y.o. male is s/p R iliofemoral endarterectomy with bovine patch angioplasty and R third toe amputation 1 Day Post-Op   Distal signals improved compared to pre-op however patient is unlikely to heal R third toe amputation Continue IV zinacef BID wet to dry dressing changes R foot Ok to  transfer to stepdown Dr. Imogene Burnhen to discuss treatment plan with family today We will tentatively plan for R BKA/AKA for tomorrow   Emilie RutterMatthew Eveland, PA-C [facility] [phone] 09/03/2017 8:41 AM  Addendum  I have independently interviewed and examined the patient, and I agree with the physician assistant's findings.  Essentially no bleeding from open amputation site.  I don't think this is likely to heal.  Signals at PT and peroneal artery are significantly better.    - Options: wound care for a couple of weeks and re-evaluate the R foot vs proceed with R AKA. - Pt unable to communicate with treatment team - Will contact family  - Will reserve the OR space for tomorrow pending family decision  Leonides SakeBrian Chen, MD, FACS [facility] Office: [phone] Pager: [phone]  09/03/2017, 10:52 AM   Addendum  Daughter back at bedside.  Pt reported have no pain in R leg at this time.    - will cancel R AKA - Consult Dr. Lajoyce Cornersuda to evaluate the right foot, suspect will recommend observing for the next couple of weeks - No issues overnight, will discharge home with home health for wound care  Leonides SakeBrian Chen, MD, FACS  [facility] [place] Office: [phone] Pager: [phone]  09/03/2017, 11:25 AM

## 2017-09-03 NOTE — Progress Notes (Signed)
Nutrition Follow-up  DOCUMENTATION CODES:   Non-severe (moderate) malnutrition in context of chronic illness  INTERVENTION:   -Continue Glucerna Shake po TID, each supplement provides 220 kcal and 10 grams of protein -MVI daily  NUTRITION DIAGNOSIS:   Moderate Malnutrition related to chronic illness(CHF) as evidenced by mild fat depletion, mild muscle depletion, moderate muscle depletion.  GOAL:   Patient will meet greater than or equal to 90% of their needs  MONITOR:   PO intake, Supplement acceptance, Labs, Weight trends, Skin, I & O's  REASON FOR ASSESSMENT:   Malnutrition Screening Tool    ASSESSMENT:   Pt is 72 year old male scheduled for R endarterectomy ilio-femoral with bovine patch angioplasty, amputation R 3rd toe, R femoral-tibial bypass graft on 09/02/2017   1/15- s/p rt iliofemoral endarterectomy with bovine patch angioplasty, rt open third toe amputation  Spoke with pt daughter at bedside. She reports pt's appetite is extremely variable (some days he will eat great, other days he will eat poorly). Shares pt typivally consumes 3 meals and 2-3 snacks throughout the day. Intake consists of Breakfast: hot cereal or egg scramble, Mid-Morning snack: glucerna shake, Lunch: fruit salad, Dinner: rice or tomato, chicken and spinach stew, HS snack: Glucerna shake. Pt daughter reports pt often does not eat meat, even when very tender- he often spits it out. Pt also does not like the smell of cooked eggs, but will consume boiled eggs. He accepts Glucerna shakes very well at home and was consuming one during RD visit.   Pt daughter reports wt loss, but unsure of amount or time frame. She reports she noticed wt loss the most when he was placed on diuretic therapy.   Per [facility] notes, poor healing potential on amputation site. Options include wound care for a couple of weeks and re-evaluate the R foot vs proceed with R AKA.  [facility].   Discussed importance of good meal and supplement intake. Pt amenable to continue Glucerna supplements.   Labs reviewed: CBGS: 181-219 (inpatient orders for glycemic control are 5 units inslin aspart q HS and 5 units insulin glargine q HS).   NUTRITION - FOCUSED PHYSICAL EXAM:    Most Recent Value  Orbital Region  Mild depletion  Upper Arm Region  Mild depletion  Thoracic and Lumbar Region  No depletion  Buccal Region  Mild depletion  Temple Region  Mild depletion  Clavicle Bone Region  Severe depletion  Clavicle and Acromion Bone Region  Moderate depletion  Scapular Bone Region  Severe depletion  Dorsal Hand  Mild depletion  Patellar Region  Mild depletion  Anterior Thigh Region  Mild depletion  Posterior Calf Region  Mild depletion  Edema (RD Assessment)  Mild  Hair  Reviewed  Eyes  Reviewed  Mouth  Reviewed  Skin  Reviewed  Nails  Reviewed       Diet Order:  Diet Carb Modified Fluid consistency: Thin; Room service appropriate? Yes  EDUCATION NEEDS:   Education needs have been addressed  Skin:  Skin Assessment: Skin Integrity Issues: Skin Integrity Issues:: Incisions Incisions: rt groin, rt foot  Last BM:  PTA  Height:   Ht Readings from Last 1 Encounters:  09/02/17 5\' 10"  (1.778 m)    Weight:   Wt Readings from Last 1 Encounters:  09/02/17 140 lb (63.5 kg)    Ideal Body Weight:  75.5 kg  BMI:  Body mass index is 20.09 kg/m.  Estimated Nutritional Needs:  Kcal:  1900-2100  Protein:  95-110 grams  Fluid:  > 1.9 L    Rosemarie Galvis A. Mayford Knife, RD, LDN, CDE Pager: [phone] After hours Pager: [phone]

## 2017-09-03 NOTE — Progress Notes (Signed)
[facility] - GIP RN visit at 0945am  This is a related and non covered GIP admission of 09/02/17 with [facility] diagnosis of Systolic Heart Failure, notified Dr. Tomasa Hosteller.  Patient is a FULL CODE.  Patient currently in the hospital s/p R iliofemoral endarterectomy with bovine patch angioplasty and amputation of R 3rd toe.  Hospice was not notified prior to surgery that patient was going to hospital.    Met with patient at bedside, no family present at this time, and patient was sitting up in bed, looking at his phone.  Patient was alert, unable to determine orientation (patient speaks [place]).  Bedside RN advised that patient was doing well.  He has not had alot of pain.  She expressed that she believes he is somewhat altered and they are waiting on family member to come and talk with patient.   I also spoke with Madara, [facility] CSW, who saw patient today also.    Patient receiving:  Digoxin (LANOXIN) tablet 0.125 mg, Dose 0.125, QD via PO; feeding supplement (ENSURE ENLIVE) liquid 237 mL, Dose 237 mL, BID daily between meals via PO; feeding supplement (GLUCERNA SHAKE) liquid 237 mL, Dose 237 mL, BID via PO; furosemide (LASIX) tablet 40 mg, Dose 40 mg, QD via PO; heparin injection 5,000 Units, Dose 5,000 units, Q8H via [place]; potassium chloride SA (K-DUR, KLOR-CON) CR tablet 20-40 mEq, Dose 20-40 mEq, Once via PO; spironolactone (ALDACTONE) tablet 12.5 mg, Dose 12.5 mg, QD via PO.  Continuous medications:  0.9 sodium chloride infusion, Rate 75 mL/hr, Continuous via IV; cefUROXime (ZINACEF) 1.5 g in dextrose 5% 50 mL IVPB, Dose 1.5 g, Q12H via IV.  Patient has received no PRN medications today thus far.    Patient has Emergency Medicaid insurance only.  This will be a related visit, but non-covered by Hospice.  I will place transfer summary and medication list on the shadow chart.  I have notified Dr. Orpah Melter and Dr. Arnoldo Morale of admission to the hospital.  We  will continue to follow the patient while hospitalized and anticipate discharge needs.    Please do not hesitate to call with any hospice related questions.   Thank you,  Edyth Gunnels, RN, BSN [facility] Liaison [phone]  All hospital liaisons are now on [place].

## 2017-09-03 NOTE — Progress Notes (Signed)
Paged matthew eveland pa, regarding orders for cbg,ssi, aline if we can d/c or not, in and out cath as pt unable to void. New orders rec'd but to keep aline in for now. Will reassess when may d/c aline.

## 2017-09-03 NOTE — Progress Notes (Signed)
PT Cancellation Note  Patient Details Name: [name] MRN: [id number] DOB: [birth year]   Cancelled Treatment:     Patient mobilized OOB with nursing staff, no family present at bedside at this time, Will follow next am for evaluation and clarification for d/c plan hospice vs home health PT services. Currently chart indicated that plan is for d/c home with [facility].   Fabio AsaDevon J [name] 09/03/2017, 4:34 PM

## 2017-09-03 NOTE — Telephone Encounter (Signed)
Amy from Hospice called to inform that Pt has been [facility] 2c04 on 09/02/2017

## 2017-09-03 NOTE — Progress Notes (Signed)
[facility] Hospital Liaison:  RN   Spoke with Lelon MastSamantha, [facility], who advised that she will touch base with me first thing in the morning to advise if there are additional equipment needs.  CMRN advised that Medicaid will not pay for PT services.    Thank you,  Adele BarthelAmy Evans, RN, BSN [facility] Hospital Liaison [phone]  All hospital liaison are now on [place].

## 2017-09-04 ENCOUNTER — Telehealth: Payer: Self-pay | Admitting: Vascular Surgery

## 2017-09-04 DIAGNOSIS — E440 Moderate protein-calorie malnutrition: Secondary | ICD-10-CM

## 2017-09-04 DIAGNOSIS — I739 Peripheral vascular disease, unspecified: Secondary | ICD-10-CM

## 2017-09-04 LAB — GLUCOSE, CAPILLARY
Glucose-Capillary: 122 mg/dL — ABNORMAL HIGH (ref 65–99)
Glucose-Capillary: 133 mg/dL — ABNORMAL HIGH (ref 65–99)
Glucose-Capillary: 110 mg/dL — ABNORMAL HIGH (ref 65–99)

## 2017-09-04 LAB — URINE CULTURE: Culture: NO GROWTH

## 2017-09-04 NOTE — Care Management Note (Addendum)
Case Management Note Previous Note Created by Sidney AceJulie Amerson  Patient Details  Name: [name] MRN: [id number] Date of Birth: September 14, 1945  Subjective/Objective:   Pt admitted on 09/02/17 s/p RT iliofemoral endarterectomy with bovine patch angioplasty and amputation of RT 3rd toe.  PTA, pt resided at home with family members.  [facility].                   Action/Plan: [facility] aware of admission.  Admission not related to hospice diagnosis.  Pt plans to dc home with continuation of home hospice services at dc.  Will follow.    Expected Discharge Date:                  Expected Discharge Plan:  Home w Hospice Care  In-House Referral:     Discharge planning Services  CM Consult  Post Acute Care Choice:    Choice offered to:     DME Arranged:    DME Agency:     HH Arranged:    HH Agency:     Status of Service:  In process, will continue to follow  If discussed at Long Length of Stay Meetings, dates discussed:    Additional Comments: 09/04/2017  Pt has been cleared for discharge to home today - pt will [facility] with [facility] - agency informed that pt has discharge orders.  CM spoke with pt and family at beside - pt will transport home via private vehicle driven by family (PT at bedside and confirmed pt is appropriate for private vehicle transport).  Family informed CM that pt has all required equipment in the home. Pt was able to effectively void and therefore foley is not necessary per bedside nurse. NO CM needs identified prior to discharge  CM informed that pt does not have HHPT/OT benefits - CM left sticky note for therapy to inform of HH restrictions Cherylann ParrClaxton, Catalena Stanhope S, RN 09/04/2017, 10:01 AM

## 2017-09-04 NOTE — Progress Notes (Signed)
Pt urinated 275 when prompted by daughter; bladder scanned with max volume showing 145. When prompted to try and urinate again pt refused.   [place]  Rosland Riding, RN

## 2017-09-04 NOTE — Progress Notes (Signed)
Contacting PA about whether pt needs foley cath or to continue I&O caths at hospice due to urine retention. Will begin d/c once this is made clear.   [place]  Filiberto Wamble, RN

## 2017-09-04 NOTE — Evaluation (Addendum)
Physical Therapy Evaluation Patient Details Name: [name] MRN: [id number] DOB: [birth year] Today's Date: 09/04/2017   History of Present Illness  Pt is a 72 year old man admitted 09/02/17 for scheduled R iliofemoral endarterectomy with bovine patch angioplasty and 3rd toe amputation. PMH: CAD, CHF, DM, CVA, R LE embolism, PAD, HTN.    Clinical Impression  Pt presents with decreased activity tolerance and an overall decrease in functional mobility secondary to above. PTA, pt ambulates with RW and assist from daughter. Today, required modA+2 to stand and take steps to chair. Daughter present throughout session, participating with assist and interpretation. She plans to provide 24/7 assist for pt upon his return home today. Educ on darco boot application and WB through heel. Pt would benefit from continued acute PT services to maximize functional mobility and independence.     Follow Up Recommendations Supervision/Assistance - 24 hour    Equipment Recommendations  None recommended by PT    Recommendations for Other Services       Precautions / Restrictions Precautions Precautions: Fall Required Braces or Orthoses: Other Brace/Splint Other Brace/Splint: DARCO shoe on R Restrictions Weight Bearing Restrictions: Yes Other Position/Activity Restrictions: No MD orders for WB status; ambulated with WB through heel only with Darco shoe      Mobility  Bed Mobility Overal bed mobility: Needs Assistance Bed Mobility: Supine to Sit     Supine to sit: Mod assist     General bed mobility comments: assist with bed pad to shift hips to EOB  Transfers Overall transfer level: Needs assistance Equipment used: Rolling walker (2 wheeled) Transfers: Sit to/from Stand Sit to Stand: Mod assist         General transfer comment: assist to rise and steady, flexed posture, performed x 2 as pt generally weak, upon second stand, pt backed in to chair with RW and 2 person min  assist  Ambulation/Gait Ambulation/Gait assistance: Mod assist;+2 safety/equipment Ambulation Distance (Feet): 2 Feet Assistive device: Rolling walker (2 wheeled) Gait Pattern/deviations: Step-to pattern;Trunk flexed Gait velocity: Decreased Gait velocity interpretation: <1.8 ft/sec, indicative of risk for recurrent falls General Gait Details: Pt instructed to take steps forwards, but only backing up with RW to chair, requiring modA+2 and eventually moving chair close as pt attempting to sit without it  Stairs            Wheelchair Mobility    Modified Rankin (Stroke Patients Only)       Balance Overall balance assessment: Needs assistance   Sitting balance-Leahy Scale: Fair       Standing balance-Leahy Scale: Poor Standing balance comment: flexed posture, unable to release walker in standing                             Pertinent Vitals/Pain Faces Pain Scale: Hurts a little bit Pain Location: R foot Pain Descriptors / Indicators: Grimacing;Guarding Pain Intervention(s): Monitored during session;Repositioned    Home Living Family/patient expects to be discharged to:: Private residence Living Arrangements: Children Available Help at Discharge: Family;Available 24 hours/day Type of Home: House Home Access: Stairs to enter Entrance Stairs-Rails: None Entrance Stairs-Number of Steps: 3 Home Layout: One level Home Equipment: Cane - single point;Walker - 2 wheels;Bedside commode;Wheelchair - manual      Prior Function Level of Independence: Needs assistance   Gait / Transfers Assistance Needed: used RW and was assisted with ambulation  ADL's / Homemaking Assistance Needed: Daughter was assisting for all ADL's.  Hand Dominance   Dominant Hand: Right    Extremity/Trunk Assessment   Upper Extremity Assessment Upper Extremity Assessment: Generalized weakness    Lower Extremity Assessment Lower Extremity Assessment: Generalized  weakness;LLE deficits/detail LLE Deficits / Details: s/p 3rd toe amputation    Cervical / Trunk Assessment Cervical / Trunk Assessment: Kyphotic;Other exceptions Cervical / Trunk Exceptions: Forward head  Communication   Communication: Prefers language other than English(French)  Cognition Arousal/Alertness: Awake/alert Behavior During Therapy: Flat affect Overall Cognitive Status: Difficult to assess                                        General Comments General comments (skin integrity, edema, etc.): Daughter present throughout session    Exercises     Assessment/Plan    PT Assessment Patient needs continued PT services  PT Problem List Decreased strength;Decreased range of motion;Decreased activity tolerance;Decreased balance;Decreased mobility;Decreased cognition;Decreased knowledge of use of DME;Decreased safety awareness;Decreased knowledge of precautions;Pain       PT Treatment Interventions DME instruction;Gait training;Stair training;Functional mobility training;Therapeutic activities;Therapeutic exercise;Balance training;Patient/family education;Wheelchair mobility training    PT Goals (Current goals can be found in the Care Plan section)  Acute Rehab PT Goals Patient Stated Goal: to return home with daughter's assist PT Goal Formulation: With patient/family Time For Goal Achievement: 09/18/17 Potential to Achieve Goals: Good    Frequency Min 3X/week   Barriers to discharge        Co-evaluation PT/OT/SLP Co-Evaluation/Treatment: Yes Reason for Co-Treatment: For patient/therapist safety;To address functional/ADL transfers PT goals addressed during session: Mobility/safety with mobility;Proper use of DME         AM-PAC PT "6 Clicks" Daily Activity  Outcome Measure Difficulty turning over in bed (including adjusting bedclothes, sheets and blankets)?: Unable Difficulty moving from lying on back to sitting on the side of the bed? :  Unable Difficulty sitting down on and standing up from a chair with arms (e.g., wheelchair, bedside commode, etc,.)?: Unable Help needed moving to and from a bed to chair (including a wheelchair)?: A Lot Help needed walking in hospital room?: A Lot Help needed climbing 3-5 steps with a railing? : A Lot 6 Click Score: 9    End of Session Equipment Utilized During Treatment: Gait belt Activity Tolerance: Patient limited by fatigue Patient left: in chair;with call bell/phone within reach;with family/visitor present Nurse Communication: Mobility status PT Visit Diagnosis: Other abnormalities of gait and mobility (R26.89)    Time: 2952-8413 PT Time Calculation (min) (ACUTE ONLY): 27 min   Charges:   PT Evaluation $PT Eval Moderate Complexity: 1 Mod     PT G Codes:       Ina Homes, PT, DPT Acute Rehab Services  Pager: [phone]  Malachy Chamber 09/04/2017, 2:36 PM

## 2017-09-04 NOTE — Telephone Encounter (Signed)
-----   Message from Sharee PimpleMarilyn K McChesney, RN sent at 09/04/2017  1:16 PM EST ----- Regarding: 2 weeks with BLC   ----- Message ----- From: Forestine NaEveland, Matthew, PA-C Sent: 09/04/2017   1:07 PM To: Vvs Charge Pool  Can you schedule an appt for this pt in about 2 weeks with Dr. Imogene Burnhen.  PO R iliofem endart with 3rd toe amp. Thanks, [employer] ChemicalMatt

## 2017-09-04 NOTE — Discharge Summary (Signed)
Physician Discharge Summary   Patient ID: [name] [id number] 72 y.o. Sanchez/[birth year]  Admit date: 09/02/2017  Discharge date and time: 09/04/17   Admitting Physician: Devin Hertz, MD   Discharge Physician: Dr. Imogene Sanchez  Admission Diagnoses: ISCHEMIA RIGHT LOWER EXTREMITY  Discharge Diagnoses: same  Admission Condition: poor  Discharged Condition: poor  Indication for Admission:  Right leg rest pain with sub-total occlusion of right proximal common femoral artery and right third toe gangrene  Hospital Course: [name] is a 72 year old male who came in as an outpatient for right iliofemoral endarterectomy with bovine patch angioplasty and right third toe by Dr. Imogene Sanchez on 09/02/17.  Surgical history significant for right femoropopliteal on 04/2017 with subsequent arteriogram on 07/2017 demonstrating diffuse disease with occlusion distal popliteal and all proximal tibial arteries.  The patient tolerated the procedure well and was admitted to the ICU postoperatively.  Past medical history is significant for chronic heart failure with an estimated EF of 10-15% currently on home hospice care.  POD #1 patient states rest pain to right foot had resolved.  Unfortunately third toe amputation site did not demonstrate any bleeding in the OR and thus patient will likely not be able to heal this.  Dr. Lajoyce Sanchez was consulted.  Due to patient's dismal prognosis and current standing on home hospice he felt cardiac risk was too high to undergo foot salvaging surgery and thus will follow-up as needed as long as patient is asymptomatic.  The patient and his daughter do not consent for right AKA at this time and have agreed with wound care and conservative management.  Patient also had difficulty urinating postoperatively and required multiple in and out catheterizations.  Patient's daughter states he has never had trouble urinating however urinary retention is noted on patient's past medical history and his medical record  also with a history of prostate surgery in 2002.  At the time of discharge however patient was able to void.  I have recommended outpatient urology follow-up to the daughter and she agrees to this.  [facility] will call patient's daughter to schedule outpatient appointment.  He will also follow-up with vascular surgery in about 2 weeks to recheck circulation of right leg as well as third toe amputation site.  Discharge instructions were reviewed with the patient's daughter and she voices her understanding.  He will be discharged this afternoon in fair condition.  Consults: orthopedic surgery  Treatments: surgery by Dr. Imogene Sanchez 09/04/17 1.  Right iliofemoral endarterectomy with bovine patch angioplasty 2.  Right open third toe amputation  Discharge Exam: see progress note 09/04/17 Vitals:   09/04/17 0913 09/04/17 1220  BP:  113/78  Pulse: 91 93  Resp:  16  Temp:  97.8 F (36.6 C)  SpO2:  99%    Disposition: 50-Hospice/Home  Patient Instructions:  Allergies as of 09/04/2017   No Known Allergies     Medication List    TAKE these medications   acetaminophen 325 MG tablet Commonly known as:  TYLENOL Take 2 tablets (650 mg total) by mouth every 4 (four) hours as needed for headache or mild pain.   aspirin 81 MG EC tablet Take 1 tablet (81 mg total) by mouth daily.   atorvastatin 80 MG tablet Commonly known as:  LIPITOR Take 1 tablet (80 mg total) by mouth daily at 6 PM.   digoxin 0.125 MG tablet Commonly known as:  LANOXIN Take 1 tablet (0.125 mg total) by mouth daily.   GLUCERNA Liqd Take 237 mLs by  mouth 2 (two) times daily.   feeding supplement (ENSURE ENLIVE) Liqd Take 237 mLs by mouth 2 (two) times daily between meals.   furosemide 40 MG tablet Commonly known as:  LASIX Take 1 tablet (40 mg total) by mouth daily.   HYDROcodone-acetaminophen 5-325 MG tablet Commonly known as:  NORCO/VICODIN Take 1 tablet by mouth every 6 (six) hours as needed for moderate  pain.   insulin glargine 100 UNIT/ML injection Commonly known as:  LANTUS Inject 0.05 mLs (5 Units total) into the skin at bedtime.   NOVOLOG 100 UNIT/ML injection Generic drug:  insulin aspart Inject 5 Units into the skin at bedtime.   oxyCODONE-acetaminophen 5-325 MG tablet Commonly known as:  PERCOCET/ROXICET Take 1-2 tablets by mouth every 4 (four) hours as needed for moderate pain.   pantoprazole 40 MG tablet Commonly known as:  PROTONIX Take 1 tablet (40 mg total) by mouth daily.   QUEtiapine 25 MG tablet Commonly known as:  SEROQUEL Take 1 tablet (25 mg total) by mouth 2 (two) times daily.   rivaroxaban 20 MG Tabs tablet Commonly known as:  XARELTO Take 1 tablet (20 mg total) by mouth daily with supper.   senna 8.6 MG Tabs tablet Commonly known as:  SENOKOT Take 2 tablets by mouth 2 (two) times daily.   sodium chloride 0.65 % Soln nasal spray Commonly known as:  OCEAN Place 1 spray into both nostrils as needed for congestion.   spironolactone 25 MG tablet Commonly known as:  ALDACTONE Take 12.5 mg by mouth daily.   traMADol 50 MG tablet Commonly known as:  ULTRAM Take 1 tablet (50 mg total) by mouth every 12 (twelve) hours as needed for severe pain.      Activity: activity as tolerated Diet: regular diet Wound Care: as directed, reinforce dressing PRN and wet to dry  Follow-up with Dr. Imogene Burnhen in 2 weeks.  Signed: Emilie RutterMatthew Sanchez 09/04/2017 12:35 PM  Addendum  [name] is a 72 y.o. ([name]) male with right 3rd toe dry gangrene and rest pain.  This patient has CAD and cardiomyopathy with known EF as low as 10%.  He was risk stratified high risk.  Anesthesia also expressed concerns with any prolonged procedure.  I recommended to the patient right iliofemoral artery endarterectomy with bovine patch angioplasty and right third toe amputation.  Intra-operatively, there was a exophytic lesion in the proximal common femoral artery that occupied 75-90% of  the common femoral artery lumen.  This was successful endarterectomized and the common femoral artery patched.  Distally, the third toe amputation demonstrated minimal to no bleeding.  There was either pus or liquefied fat at the level of third metatarsal bone.  I subsequently elected to leave the amputation site open.    By POD #1, this patient had complete resolution of his rest pain with minimal post-operative pain.  After further discussion with the patient and his daughter, we felt would manage the right foot with wound care and re-evaluate in 2 weeks.  On POD #2, I had Dr. Lajoyce Cornersuda evaluate the right foot: he felt no further reconstruction was possible due to his poor cardiac status.  The patient is being discharged back to home hospice and will follow up in the office in 2 weeks.   Leonides SakeBrian Conard Alvira, MD, FACS [facility] [place] Office: [phone] Pager: [phone]  09/04/2017, 4:05 PM

## 2017-09-04 NOTE — Progress Notes (Signed)
Pt's ride will not be available until 4pm.   [place]  Maverick Dieudonne, RN

## 2017-09-04 NOTE — Discharge Instructions (Signed)
Wet-to-dry dressing around R foot with gauze & paper tape. Clean with normal saline. Watch for infection & notify MD.

## 2017-09-04 NOTE — Progress Notes (Addendum)
  Progress Note    09/04/2017 10:34 AM 2 Days Post-Op  Subjective:  No pain this morning   Vitals:   09/04/17 0806 09/04/17 0913  BP: 108/60   Pulse: 89 91  Resp: 20   Temp: 97.9 F (36.6 C)   SpO2: 98%    Physical Exam: Lungs:  Non labored Incisions:  R groin incision intact without palpable hematoma Extremities:  R foot dressing recently changed and left in place; AT by doppler Abdomen:  soft Neurologic: A&O  CBC    Component Value Date/Time   WBC 8.0 09/03/2017 0500   RBC 4.32 09/03/2017 0500   HGB 10.5 (L) 09/03/2017 0500   HCT 29.0 (L) 09/03/2017 0500   PLT 236 09/03/2017 0500   MCV 67.1 (L) 09/03/2017 0500   MCH 24.3 (L) 09/03/2017 0500   MCHC 36.2 (H) 09/03/2017 0500   RDW 16.7 (H) 09/03/2017 0500   LYMPHSABS 2.1 07/07/2017 0444   MONOABS 0.8 07/07/2017 0444   EOSABS 0.2 07/07/2017 0444   BASOSABS 0.0 07/07/2017 0444    BMET    Component Value Date/Time   NA 135 09/03/2017 0500   NA 120 (L) 06/02/2017 1026   K 3.7 09/03/2017 0500   CL 102 09/03/2017 0500   CO2 23 09/03/2017 0500   GLUCOSE 117 (H) 09/03/2017 0500   BUN 18 09/03/2017 0500   BUN 17 06/02/2017 1026   CREATININE 0.84 09/03/2017 0500   CALCIUM 9.0 09/03/2017 0500   GFRNONAA >60 09/03/2017 0500   GFRAA >60 09/03/2017 0500    INR    Component Value Date/Time   INR 1.25 09/03/2017 0500     Intake/Output Summary (Last 24 hours) at 09/04/2017 1034 Last data filed at 09/03/2017 1900 Gross per 24 hour  Intake 1050 ml  Output 600 ml  Net 450 ml     Assessment/Plan:  72 y.o. male is s/p  R iliofemoral endarterectomy with bovine patch angioplasty and R third toe amputation  2 Days Post-Op   Rest pain R foot resolved since surgery Continue dressing changes to R foot D/c to home today and will resume home hospice Follow up with Dr. Imogene Burnhen in 2 weeks   Emilie RutterMatthew Eveland, PA-C [facility] [phone] 09/04/2017 10:34 AM   Addendum  I have independently  interviewed and examined the patient, and I agree with the physician assistant's findings.  Appreciate Dr. Audrie Liauda's input.  Appears Dr. Lajoyce Cornersuda feels the cardiac status would limit options.    - Cont wet-to-dry dressing to R foot for the next two weeks - Suspect the R foot will declare itself by that time.  If pt starts granulating, might be salvageable, but I suspect R AKA will likely be needed. - Pt is currently relatively asx currently so family and pt would like to wait and see  Leonides SakeBrian Aqib Lough, MD, FACS [facility] [place] Office: [phone] Pager: [phone]  09/04/2017, 12:14 PM

## 2017-09-04 NOTE — Progress Notes (Signed)
Pt's A-line and IV removed; family advised to collect belongings; daughter and her husband will be ride home; given products to cleanse foot and change dressing; educated; hospice nurse will see pt at home 1/18; will wheel pt out once ready.   [place]  Nichoals Heyde, RN

## 2017-09-04 NOTE — Progress Notes (Addendum)
[facility] - GIP RN visit at 1130am  This is a related and non covered GIP admission of 09/02/17 with [facility] diagnosis of Systolic Heart Failure, notified Dr. Tomasa Hosteller.  Patient is a FULL CODE.  Patient currently in the hospital s/p R iliofemoral endarterectomy with bovine patch angioplasty and amputation of R 3rd toe.  Hospice was not notified prior to surgery that patient was going to hospital.    Met with patient and daughter, Jeralene Huff, at bedside.  Bedside RN, [place], in the room during my visit.  Patient alert and oriented.  Per daughter, patient is unable to urinate since surgery.  Patient has required in and out catheterizations several times since surgery to drain bladder.   Daughter states that patient had a foley catheter in the past and he didn't tolerate well and would most like request to take out if placed.  Per [place], she was following up with MD to get suggestions on whether the foley would be placed.  Plan at this time is for patient to still be discharged home today.   Patient receiving:  Digoxin (LANOXIN) tablet 0.125 mg, Dose 0.125, QD via PO; feeding supplement (GLUCERNA SHAKE) liquid 237 mL, Dose 237 mL, BID via PO; furosemide (LASIX) tablet 40 mg, Dose 40 mg, QD via PO; heparin injection 5,000 Units, Dose 5,000 units, Q8H via [place]; potassium chloride SA (K-DUR, KLOR-CON) CR tablet 20-40 mEq, Dose 20-40 mEq, Once via PO; senna (SENOKOT) tablet 17.2 mg, Dose 2 tablets, BID via PO; spironolactone (ALDACTONE) tablet 12.5 mg, Dose 12.5 mg, QD via PO.  Continuous medications:  0.9 sodium chloride infusion, Rate 75 mL/hr, Continuous via IV; cefUROXime (ZINACEF) 1.5 g in dextrose 5% 50 mL IVPB, Dose 1.5 g, Q12H via IV.  Patient has received no PRN medications today thus far.    Patient has Emergency Medicaid insurance only.  This will be a related visit, but non-covered by Hospice.    Please do not hesitate to call with any hospice related  questions.   Thank you,  Edyth Gunnels, RN, BSN [facility] Liaison [phone]  All hospital liaisons are now on [place].

## 2017-09-04 NOTE — Consult Note (Signed)
ORTHOPAEDIC CONSULTATION  REQUESTING PHYSICIAN: Fransisco Hertz, MD  Chief Complaint: Ulcer right foot status post third toe amputation.  HPI: [name] is a 72 y.o. male who presents with severe critical limb ischemia to the right lower extremity.  Patient is undergone revascularization and amputation of the gangrenous third toe.  Patient has severe cardiac disease as well.  Patient denies any pain in his foot at this time.  Past Medical History:  Diagnosis Date  . CAD (coronary artery disease) 2007   a. CABG 2007 in [place].  . Cerebrovascular disease   . Chronic systolic CHF (congestive heart failure) ([name])    a. EF 20-25% by echo in 03/2017 with NST showing prior infarct but no ischemia. Prior LVEF not known but patient reported history of weak heart.  . Diabetes mellitus without complication ([name])   . Dyspnea    reports d/t CHF  . Enlarged prostate   . Hyperlipidemia   . Hypertension   . Lower extremity embolism ([name])    a. 04/2017 s/p thrombectomy of the right femoropopliteal artery, right below-the-knee popliteal artery endarterectomy with bovine patch angioplasty, and proximal endarterectomy of the right tibioperoneal trunk and peroneal artery..  . Pulmonary embolism ([name])   . PVC's (premature ventricular contractions)   . Stroke [facility])    a. ~2016, also 04/2017 with acute R MCA occlusion s/p thrombectomy.  . Thoracic aortic aneurysm ([name])    a. 4.2 vs 4.6cm by different CTs Aug/Sept 2018.  . Type 2 diabetes mellitus ([name])    Past Surgical History:  Procedure Laterality Date  . ABDOMINAL AORTOGRAM N/A 08/04/2017   Procedure: ABDOMINAL AORTOGRAM;  Surgeon: Maeola Harman, MD;  Location: [facility] INVASIVE CV LAB;  Service: Cardiovascular;  Laterality: N/A;  . AMPUTATION Right 09/02/2017   Procedure: OPEN AMPUTATION DIGIT RIGHT THIRD TOE;  Surgeon: Fransisco Hertz, MD;  Location: [facility] OR;  Service: Vascular;  Laterality: Right;  . CARDIAC SURGERY    . CYSTOSCOPY N/A  04/25/2017   Procedure: CYSTOSCOPY FLEXIBLE;  Surgeon: Fransisco Hertz, MD;  Location[facility] OR;  Service: Vascular;  Laterality: N/A;  . EMBOLECTOMY Right 04/25/2017   Procedure: EMBOLECTOMY RIGHT FEMORAL ARTERY;  Surgeon: Fransisco Hertz, MD;  Location: [facility] OR;  Service: Vascular;  Laterality: Right;  . ENDARTERECTOMY FEMORAL Right 09/02/2017   Procedure: ENDARTERECTOMY Sherrilyn Rist WITH Livia Snellen BIOLOGIC PATCH ANGIOPLASTY RIGHT;  Surgeon: Fransisco Hertz, MD;  Location: [facility] OR;  Service: Vascular;  Laterality: Right;  . IR PERCUTANEOUS ART THROMBECTOMY/INFUSION INTRACRANIAL INC DIAG ANGIO  04/25/2017  . LOWER EXTREMITY ANGIOGRAPHY Bilateral 08/04/2017   Procedure: Lower Extremity Angiography;  Surgeon: Maeola Harman, MD;  Location: [facility] INVASIVE CV LAB;  Service: Cardiovascular;  Laterality: Bilateral;  . PATCH ANGIOPLASTY Right 04/25/2017   Procedure: PATCH ANGIOPLASTY USING Livia Snellen BIOLOGIC PATCH;  Surgeon: Fransisco Hertz, MD;  Location: [facility] OR;  Service: Vascular;  Laterality: Right;  . PROSTATE SURGERY  2002  . RADIOLOGY WITH ANESTHESIA N/A 04/24/2017   Procedure: RADIOLOGY WITH ANESTHESIA Code Stroke;  Surgeon: Gilmer Mor, DO;  Location: [facility] OR;  Service: Anesthesiology;  Laterality: N/A;   Social History   Socioeconomic History  . Marital status: Married    Spouse name: None  . Number of children: None  . Years of education: None  . Highest education level: None  Social Needs  . Financial resource strain: None  . Food insecurity - worry: None  . Food insecurity - inability: None  . Transportation needs -  medical: None  . Transportation needs - non-medical: None  Occupational History  . None  Tobacco Use  . Smoking status: Never Smoker  . Smokeless tobacco: Never Used  Substance and Sexual Activity  . Alcohol use: No  . Drug use: No  . Sexual activity: None  Other Topics Concern  . None  Social History Narrative   Pt is from [place] and speaks [place]. He came to the [place] in 02/2017.     He has a daughter and son-in-law that speak [place].    Family History  Problem Relation Age of Onset  . Hypertension Mother   . Hypertension Sister    - negative except otherwise stated in the family history section No Known Allergies Prior to Admission medications   Medication Sig Start Date End Date Taking? Authorizing Provider  acetaminophen (TYLENOL) 325 MG tablet Take 2 tablets (650 mg total) by mouth every 4 (four) hours as needed for headache or mild pain. 05/05/17  Yes Robbie Lis M, PA-C  aspirin EC 81 MG EC tablet Take 1 tablet (81 mg total) by mouth daily. 05/05/17  Yes Robbie Lis M, PA-C  atorvastatin (LIPITOR) 80 MG tablet Take 1 tablet (80 mg total) by mouth daily at 6 PM. 05/12/17  Yes Angiulli, Mcarthur Rossetti, PA-C  digoxin (LANOXIN) 0.125 MG tablet Take 1 tablet (0.125 mg total) by mouth daily. 07/16/17  Yes Sheikh, Omair Latif, DO  feeding supplement, ENSURE ENLIVE, (ENSURE ENLIVE) LIQD Take 237 mLs by mouth 2 (two) times daily between meals. 07/15/17  Yes Sheikh, Omair Latif, DO  furosemide (LASIX) 40 MG tablet Take 1 tablet (40 mg total) by mouth daily. 07/16/17  Yes Sheikh, Omair Latif, DO  GLUCERNA (GLUCERNA) LIQD Take 237 mLs by mouth 2 (two) times daily.   Yes [provider]  HYDROcodone-acetaminophen (NORCO/VICODIN) 5-325 MG tablet Take 1 tablet by mouth every 6 (six) hours as needed for moderate pain.   Yes [provider]  insulin glargine (LANTUS) 100 UNIT/ML injection Inject 0.05 mLs (5 Units total) into the skin at bedtime. 07/15/17  Yes Sheikh, Omair Latif, DO  oxyCODONE-acetaminophen (PERCOCET/ROXICET) 5-325 MG tablet Take 1-2 tablets by mouth every 4 (four) hours as needed for moderate pain. 08/29/17  Yes Fransisco Hertz, MD  pantoprazole (PROTONIX) 40 MG tablet Take 1 tablet (40 mg total) by mouth daily. 05/12/17  Yes Angiulli, Mcarthur Rossetti, PA-C  QUEtiapine (SEROQUEL) 25 MG tablet Take 1 tablet (25 mg total) by mouth 2 (two) times daily.  07/15/17  Yes Sheikh, Omair Latif, DO  senna (SENOKOT) 8.6 MG TABS tablet Take 2 tablets by mouth 2 (two) times daily.   Yes [provider]  sodium chloride (OCEAN) 0.65 % SOLN nasal spray Place 1 spray into both nostrils as needed for congestion.   Yes [provider]  spironolactone (ALDACTONE) 25 MG tablet Take 12.5 mg by mouth daily.   Yes [provider]  traMADol (ULTRAM) 50 MG tablet Take 1 tablet (50 mg total) by mouth every 12 (twelve) hours as needed for severe pain. 07/15/17  Yes Sheikh, Omair Latif, DO  NOVOLOG 100 UNIT/ML injection Inject 5 Units into the skin at bedtime. 05/06/17   [provider]  rivaroxaban (XARELTO) 20 MG TABS tablet Take 1 tablet (20 mg total) by mouth daily with supper. 07/18/17   Jaclyn Shaggy, MD   No results found. - pertinent xrays, CT, MRI studies were reviewed and independently interpreted  Positive ROS: All other systems have been reviewed and  were otherwise negative with the exception of those mentioned in the HPI and as above.  Physical Exam: General: Alert, no acute distress Psychiatric: Patient is competent for consent with normal mood and affect Lymphatic: No axillary or cervical lymphadenopathy Cardiovascular: No pedal edema Respiratory: No cyanosis, no use of accessory musculature GI: No organomegaly, abdomen is soft and non-tender  Skin: Examination there is an ischemic wound bed to the third toe amputation there is no exposed bone or tendon there is no ascending cellulitis.   Neurologic: Patient does not have protective sensation bilateral lower extremities.   MUSCULOSKELETAL:  Patient does not have a palpable pulse his extremity is thin and atrophic with some mild ischemic ulcerations.  The wound bed is stable from the amputation site.  His foot is nontender there is no purulent drainage.  Assessment: Assessment: Improved circulation right lower extremity with third toe amputation and an ischemic  wound bed.  Plan: With patient's cardiac condition and his peripheral vascular disease patient is not a candidate for any revision amputation surgery.  Would recommend continued wound care dressing, patient is asymptomatic.  I will follow-up as needed.  Thank you for the consult and the opportunity to see [name]  Arvin Abello, MD [facility] [phone] 8:09 AM

## 2017-09-04 NOTE — Telephone Encounter (Signed)
Sched appt 09/19/16 at 2:15. Spoke to pt, pt said they needed it at the very end of the day b/c they have to pick up their daughter at 2:30. Resched for 4:00.

## 2017-09-04 NOTE — Progress Notes (Signed)
Told hospice home care cannot complete I&O caths so pt will need a foley cath.   [place]  Edwardo Wojnarowski, RN

## 2017-09-04 NOTE — Evaluation (Addendum)
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 PMH: CAD, CHF, DM, CVA, R LE embolism, PAD, HTN.   Clinical Impression   Pt walks with a walker and assist. He can self feed and groom, but is otherwise assisted in bathing and dressing. Pt presents with generalized weakness and decreased activity tolerance. He stood x 2 and then backed into the chair with RW and 2 person assist. Daughter present, participated and interpreted. Will follow acutely.    Follow Up Recommendations  Supervision/Assistance - 24 hour    Equipment Recommendations  None recommended by OT    Recommendations for Other Services       Precautions / Restrictions Precautions Precautions: Fall Required Braces or Orthoses: Other Brace/Splint Other Brace/Splint: DARCO shoe on R Restrictions Weight Bearing Restrictions: Yes RLE Weight Bearing: (wt bearing through heel only)      Mobility Bed Mobility Overal bed mobility: Needs Assistance Bed Mobility: Supine to Sit     Supine to sit: Mod assist     General bed mobility comments: assist with bed pad to shift hips to EOB  Transfers Overall transfer level: Needs assistance Equipment used: Rolling walker (2 wheeled) Transfers: Sit to/from Stand Sit to Stand: Mod assist         General transfer comment: assist to rise and steady, flexed posture, performed x 2 as pt generally weak, upon second stand, pt backed in to chair with RW and 2 person min assist    Balance Overall balance assessment: Needs assistance   Sitting balance-Leahy Scale: Fair       Standing balance-Leahy Scale: Poor Standing balance comment: flexed posture, unable to release walker in standing                           ADL either  performed or assessed with clinical judgement   ADL Overall ADL's : Needs assistance/impaired Eating/Feeding: Set up;Bed level   Grooming: Supervision/safety;Sitting           Upper Body Dressing : Minimal assistance;Sitting   Lower Body Dressing: Total assistance;Sit to/from stand   Toilet Transfer: +2 for physical assistance;Minimal Production designer, theatre/television/film Details (indicate cue type and reason): simulated to chair Toileting- Clothing Manipulation and Hygiene: Maximal assistance;Sit to/from stand       Functional mobility during ADLs: +2 for physical assistance;Minimal assistance;Rolling walker(backed in to chair)       Vision   Additional Comments: difficult to assess, but appears to have L field cut vs inattentioni.     Perception     Praxis      Pertinent Vitals/Pain Pain Assessment: Faces Faces Pain Scale: Hurts a little bit Pain Location: R foot Pain Descriptors / Indicators: Grimacing;Guarding Pain Intervention(s): Repositioned;Monitored during session     Hand Dominance Right   Extremity/Trunk Assessment Upper Extremity Assessment Upper Extremity Assessment: Generalized weakness   Lower Extremity Assessment Lower Extremity Assessment: Defer to PT evaluation   Cervical / Trunk Assessment Cervical / Trunk Assessment: Kyphotic(with forward head)   Communication Communication Communication: Prefers language other than English(French)   Cognition Arousal/Alertness: Awake/alert Behavior During Therapy: Flat affect Overall Cognitive Status: Difficult to assess  General Comments       Exercises     Shoulder Instructions      Home Living Family/patient expects to be discharged to:: Private residence Living Arrangements: Children Available Help at Discharge: Family;Available 24 hours/day Type of Home: House Home Access: Stairs to enter Entergy CorporationEntrance Stairs-Number of Steps: 3 Entrance  Stairs-Rails: None Home Layout: One level     Bathroom Shower/Tub: Chief Strategy OfficerTub/shower unit   Bathroom Toilet: Standard     Home Equipment: Cane - single point;Walker - 2 wheels;Bedside commode;Wheelchair - manual          Prior Functioning/Environment Level of Independence: Needs assistance  Gait / Transfers Assistance Needed: used RW and was assisted with ambulation ADL's / Homemaking Assistance Needed: Daughter was assisting for all ADL's.             OT Problem List: Decreased strength;Decreased activity tolerance;Impaired balance (sitting and/or standing);Decreased knowledge of use of DME or AE;Pain      OT Treatment/Interventions: Self-care/ADL training;DME and/or AE instruction;Patient/family education    OT Goals(Current goals can be found in the care plan section) Acute Rehab OT Goals Patient Stated Goal: to return home with daughter's assist OT Goal Formulation: With patient/family Time For Goal Achievement: 09/11/17 Potential to Achieve Goals: Fair  OT Frequency: Min 2X/week   Barriers to D/C:            Co-evaluation PT/OT/SLP Co-Evaluation/Treatment: Yes Reason for Co-Treatment: For patient/therapist safety   OT goals addressed during session: ADL's and self-care      AM-PAC PT "6 Clicks" Daily Activity     Outcome Measure Help from another person eating meals?: A Little Help from another person taking care of personal grooming?: A Little Help from another person toileting, which includes using toliet, bedpan, or urinal?: Total Help from another person bathing (including washing, rinsing, drying)?: A Lot Help from another person to put on and taking off regular upper body clothing?: A Lot Help from another person to put on and taking off regular lower body clothing?: Total 6 Click Score: 12   End of Session Equipment Utilized During Treatment: Gait belt;Rolling walker Nurse Communication: Mobility status  Activity Tolerance: Patient limited by  fatigue Patient left: in chair;with call bell/phone within reach;with family/visitor present  OT Visit Diagnosis: Unsteadiness on feet (R26.81);Pain;Muscle weakness (generalized) (M62.81)                Time: [id number] OT Time Calculation (min): 27 min Charges:  OT General Charges $OT Visit: 1 Visit OT Evaluation $OT Eval Moderate Complexity: 1 Mod G-Codes:     09/04/2017 Martie RoundJulie Aleksis Jiggetts, OTR/L Pager: [phone]  [place] Planas[place]berry, [place] BailiffJulie Lynn 09/04/2017, 12:54 PM

## 2017-09-08 LAB — AEROBIC/ANAEROBIC CULTURE W GRAM STAIN (SURGICAL/DEEP WOUND)

## 2017-09-08 LAB — AEROBIC/ANAEROBIC CULTURE (SURGICAL/DEEP WOUND)

## 2017-09-15 NOTE — Progress Notes (Signed)
    Postoperative Visit   History of Present Illness   [name] is a 72 y.o. year old male in hospice who presents for postoperative follow-up for: R iliofem EA w/ BPA, R open 3rd toe amp (09/02/16).  The patient's is getting wound care.  R foot is not draining.  The patient notes mostly resolution of lower extremity symptoms.  The patient is able to complete their activities of daily living.  The patient's current symptoms are: greatly improved pain.   For VQI Use Only   PRE-ADM LIVING: Hospice  AMB STATUS: Wheelchair   Physical Examination   Vitals:   09/19/17 1609  BP: 110/70  Pulse: (!) 104  Resp: 16  Temp: (!) 96.9 F (36.1 C)  TempSrc: Oral  Weight: 140 lb (63.5 kg)  Height: 5\' 10"  (1.778 m)    RLE: R groin inc: c/d/i, mild firmness overlying artery, R foot: 4th toe amputation site with 75% contraction, some necrotic tissue overlying base, minimal ischemic skin  R foot cultures (09/02/17): ABUNDANT PSEUDOMONAS AERUGINOSA  ABUNDANT ENTEROCOCCUS FAECALIS  NO ANAEROBES ISOLATED   Medical Decision Making   [name] is a 72 y.o. year old male who presents s/p R iliofem EA w/ BPA, R open 3rd toe amp .  Ciprofloxacin 500 mg 1 PO BID x 10 days Wet-to-dry dressing to R 3rd toe amp site. Wound check on 20 FEB 19 Thank you for allowing us to participate in this patient's care.   Leonides SakeBrian Jenah Vanasten, MD, FACS [facility] [place] Office: [phone] Pager: [phone]

## 2017-09-18 MED FILL — TRUE METRIX TEST STRIP: 30 days supply | Qty: 100 | Fill #1

## 2017-09-18 MED FILL — XARELTO 20 MG TABLET: 20 | 30 days supply | Qty: 30 | Fill #2

## 2017-09-18 MED FILL — ATORVASTATIN 80 MG TABLET: 80 | 30 days supply | Qty: 30 | Fill #1

## 2017-09-19 ENCOUNTER — Ambulatory Visit (INDEPENDENT_AMBULATORY_CARE_PROVIDER_SITE_OTHER): Payer: No Typology Code available for payment source | Admitting: Vascular Surgery

## 2017-09-19 ENCOUNTER — Encounter: Payer: No Typology Code available for payment source | Admitting: Vascular Surgery

## 2017-09-19 ENCOUNTER — Encounter: Payer: Self-pay | Admitting: Vascular Surgery

## 2017-09-19 VITALS — BP 110/70 | HR 104 | Temp 96.9°F | Resp 16 | Ht 70.0 in | Wt 140.0 lb

## 2017-09-19 DIAGNOSIS — I70269 Atherosclerosis of native arteries of extremities with gangrene, unspecified extremity: Secondary | ICD-10-CM

## 2017-09-19 MED ORDER — CIPROFLOXACIN HCL 500 MG PO TABS: 500.0000 mg | ORAL_TABLET | Freq: Two times a day (BID) | ORAL | 0 refills | Status: AC

## 2017-10-07 NOTE — Progress Notes (Signed)
    Postoperative Visit   History of Present Illness   [name] is a 72 y.o. (1946/06/23) male in hospice who presents for postoperative follow-up for: R iliofem EA w/ BPA, R open 3rd toe amp (09/02/16).  The patient's is getting wound care.  R foot is not draining.  The patient notes resolution of lower extremity symptoms.  The patient is able to walk with the help of a walker at this point.  The patient reportedly is getting discharged from hospice and [place].   For VQI Use Only   PRE-ADM LIVING: Hospice  AMB STATUS: Wheelchair   Physical Examination   Vitals:   10/08/17 1639  BP: 124/83  Pulse: 99  Resp: 16  Temp: (!) 97.5 F (36.4 C)  TempSrc: Oral  SpO2: 97%  Weight: 134 lb (60.8 kg)  Height: 5\' 10"  (1.778 m)    RLE: R 3rd toe amputation wound appears ischemic with surrounding ischemic tissue, no drainage, no pulse, no pain to palpation, no smell  Medical Decision Making   Devin Rhymessa Scribner is a 72 y.o. year old male who presents s/p R iliofem EA w/ BPA, R open 3rd toe amp, severe CAD, s/p CVA   I doubt this patient's R foot will heal given the findings but he is completely asx at this point and able to ambulate.  The patient and family are electing to proceed with a palliative approach at this point.  The family has been doing the wet-to-dry dressing changes at this point, so I don't think his foot should keep him from going home given the significant mortality rate in this patient.  I gave the patient Percocet 5/325 mg 1 PO q4 hr prn pain #30 only, no refills for episodes of severe pain re-exaceration  Will check on the patient again in 3 months [place].  Thank you for allowing us to participate in this patient's care.   Leonides SakeBrian Chen, MD, FACS [facility] [place] Office: [phone] Pager: [phone]

## 2017-10-08 ENCOUNTER — Encounter: Payer: Self-pay | Admitting: Vascular Surgery

## 2017-10-08 ENCOUNTER — Other Ambulatory Visit: Payer: Self-pay

## 2017-10-08 ENCOUNTER — Ambulatory Visit (INDEPENDENT_AMBULATORY_CARE_PROVIDER_SITE_OTHER): Payer: No Typology Code available for payment source | Admitting: Vascular Surgery

## 2017-10-08 VITALS — BP 124/83 | HR 99 | Temp 97.5°F | Resp 16 | Ht 70.0 in | Wt 134.0 lb

## 2017-10-08 DIAGNOSIS — I70269 Atherosclerosis of native arteries of extremities with gangrene, unspecified extremity: Secondary | ICD-10-CM

## 2017-10-08 MED ORDER — OXYCODONE-ACETAMINOPHEN 5-325 MG PO TABS: 1.0000 | ORAL_TABLET | ORAL | 0 refills | Status: AC | PRN

## 2017-10-09 MED FILL — XARELTO 20 MG TABLET: 20 | 30 days supply | Qty: 30 | Fill #3

## 2017-10-09 MED FILL — SPIRONOLACTONE 50 MG TABLET: 50 | 90 days supply | Qty: 90 | Fill #2

## 2017-10-10 MED FILL — NovoLOG 100 UNIT/ML SOLN: 100 | 84 days supply | Qty: 30 | Fill #1

## 2017-10-10 MED FILL — LANTUS 100 UNITS/ML VIAL: 100 | 84 days supply | Qty: 30 | Fill #1

## 2017-10-14 ENCOUNTER — Telehealth: Payer: Self-pay | Admitting: Family Medicine

## 2017-10-14 NOTE — Telephone Encounter (Signed)
[place] with [place].

## 2017-10-15 ENCOUNTER — Telehealth: Payer: Self-pay | Admitting: Family Medicine

## 2017-10-15 NOTE — Telephone Encounter (Signed)
Received fax records from Hospice, fax will be on the pcp in-box

## 2017-10-15 NOTE — Telephone Encounter (Signed)
Noted  

## 2017-10-16 ENCOUNTER — Telehealth: Payer: Self-pay | Admitting: Family Medicine

## 2017-10-16 NOTE — Telephone Encounter (Signed)
2 page, paperwork was received through fax 10-16-17.

## 2017-10-16 NOTE — Telephone Encounter (Signed)
4 page, paperwork received through fax 10-16-17.

## 2017-12-13 IMAGING — MR MR HEAD W/O CM
8 series · 40 of 48 positions shown · non-contrast
Comparison: 07/04/2017 CT head.  04/28/2017 MRI head.

CLINICAL DATA: 71 y/o  M; 1 week of altered mental status.

EXAM:
MRI HEAD WITHOUT CONTRAST
TECHNIQUE: Axial DWI, coronal DWI, axial T2 FLAIR propeller, axial gradient
echo, axial T2 propeller, and axial T1 FLAIR propeller sequences
were acquired. Due to patient's altered mental status no additional
images were acquired.

[Series 3: DWI · axial · 3.0mm · 1.09mm/px · z∈[-57,+72]mm · 8 of 88 slices shown (1 of 4)]
[im 1/88]
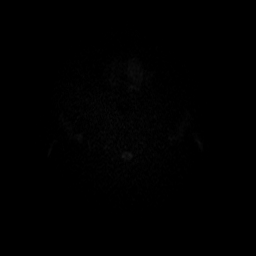
[im 14/88]
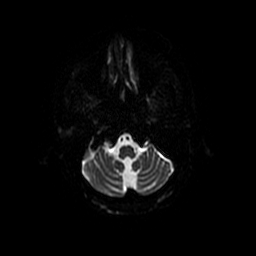
[im 27/88]
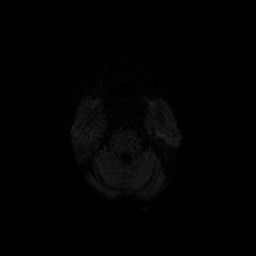
[im 41/88]
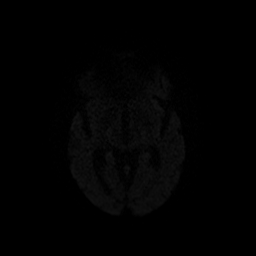
[im 47/88]
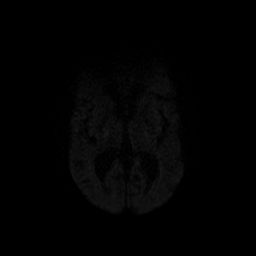
[im 61/88]
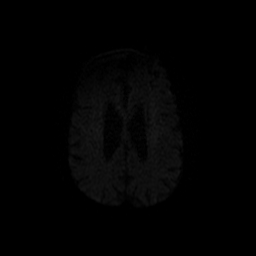
[im 74/88]
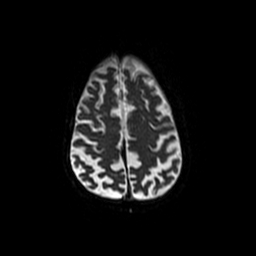
[im 88/88]
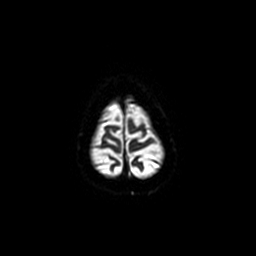

[Series 4: DWI · coronal · 5.0mm · 1.09mm/px · 11 of 72 slices shown (2 of 4)]
[im 1/72]
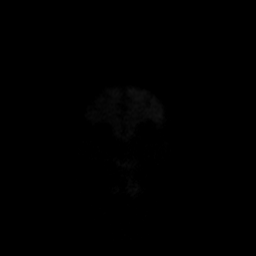
[im 8/72]
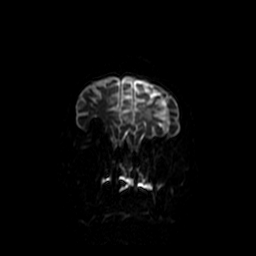
[im 15/72]
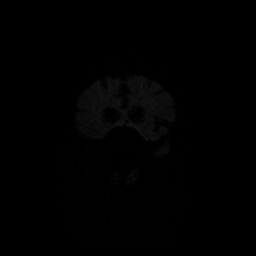
[im 22/72]
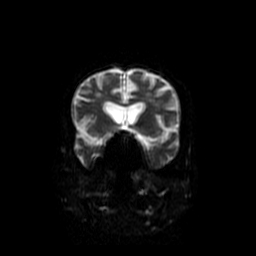
[im 29/72]
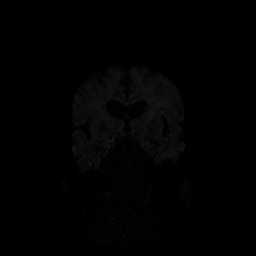
[im 36/72]
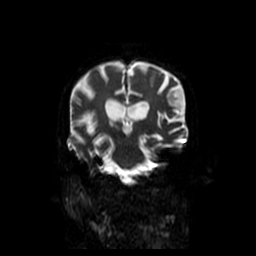
[im 43/72]
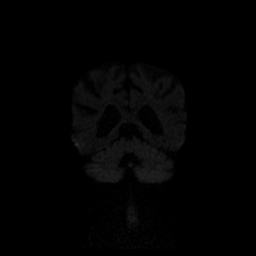
[im 50/72]
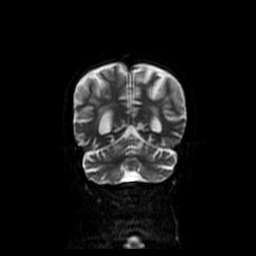
[im 57/72]
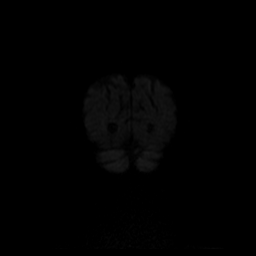
[im 64/72]
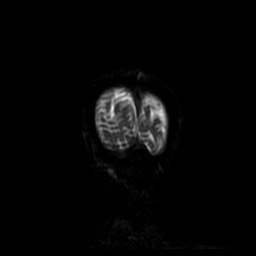
[im 72/72]
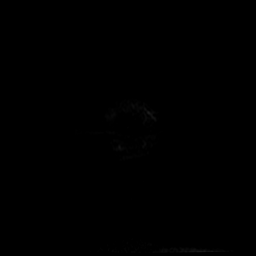

[Series 5: FLAIR · axial · 3.0mm · 0.47mm/px · z∈[-53,+72]mm · 3 of 22 slices shown (1 of 2)]
[im 1/22]
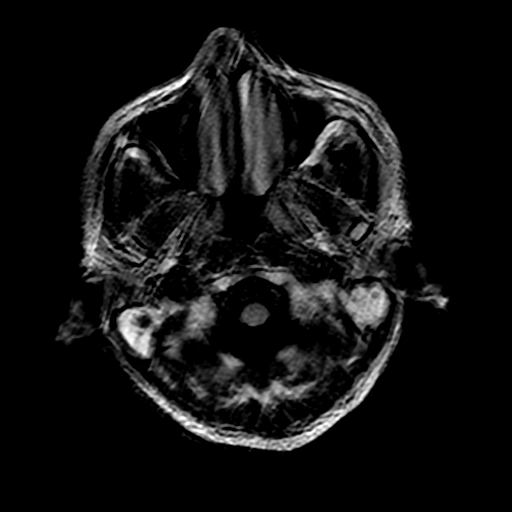
[im 11/22]
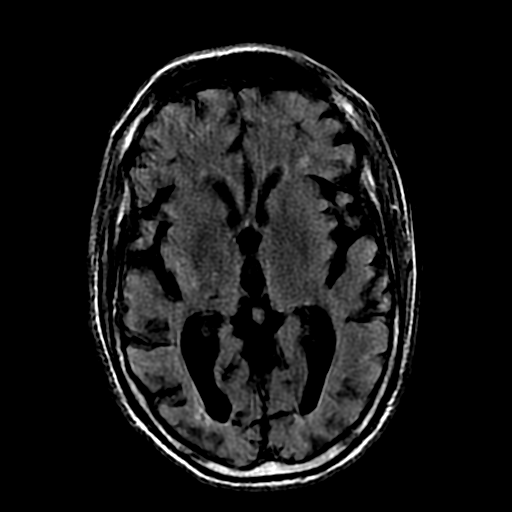
[im 22/22]
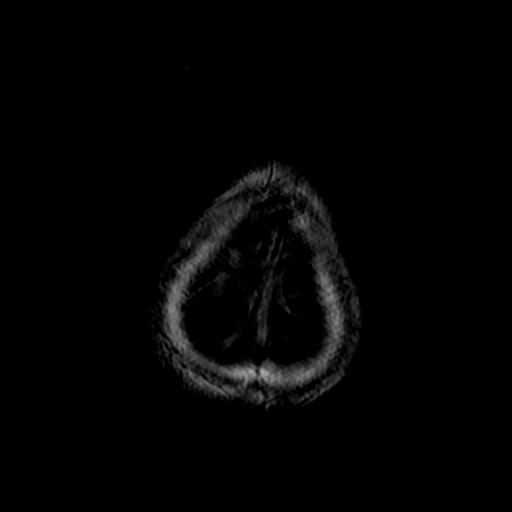

[Series 6: ax mpgr · axial · 5.0mm · 0.47mm/px · 1 of 22 slices shown]
[im 1/22]
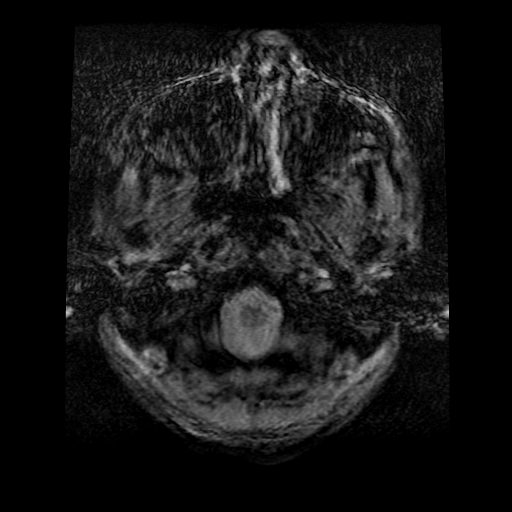

[Series 7: T2 · axial · 5.0mm · 0.47mm/px · z∈[-53,+72]mm · 3 of 22 slices shown]
[im 1/22]
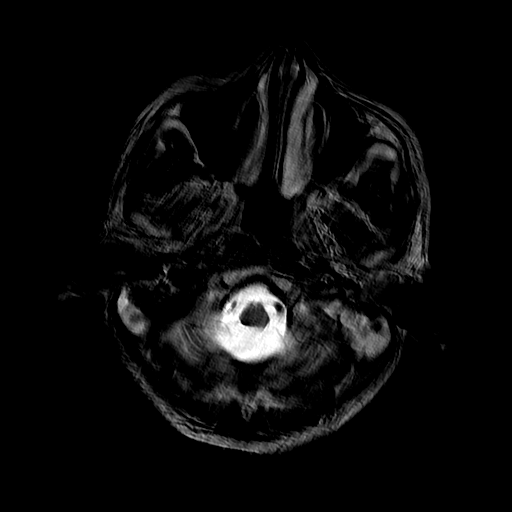
[im 11/22]
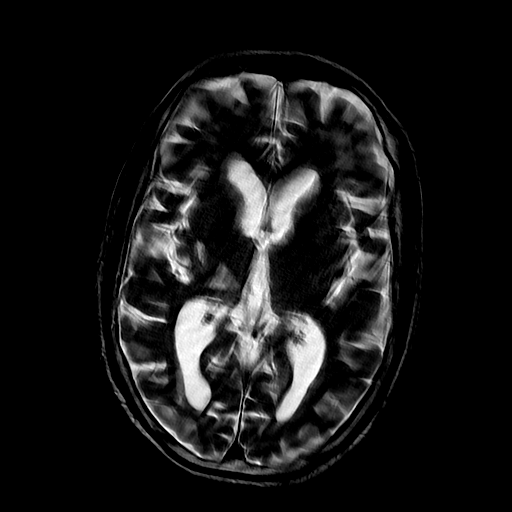
[im 22/22]
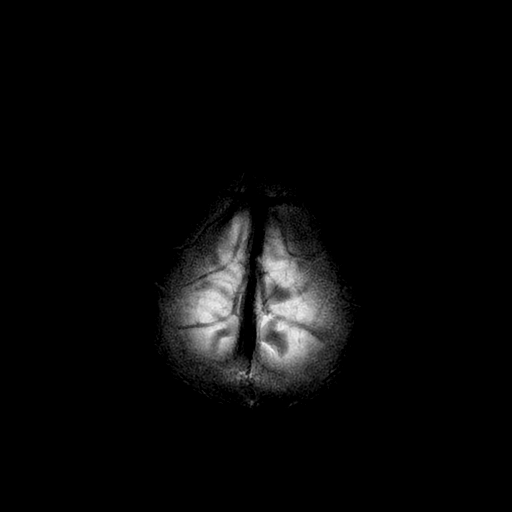

[Series 8: FLAIR · axial · 5.0mm · 0.47mm/px · z∈[-53,+72]mm · 3 of 22 slices shown (2 of 2)]
[im 1/22]
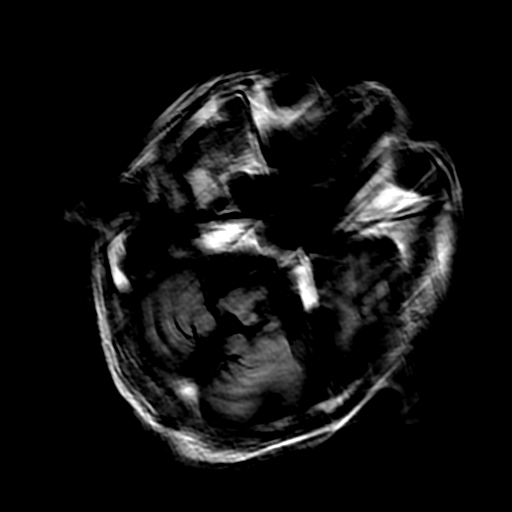
[im 11/22]
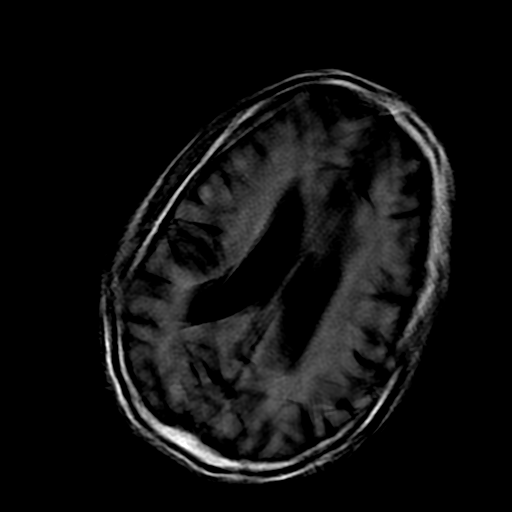
[im 22/22]
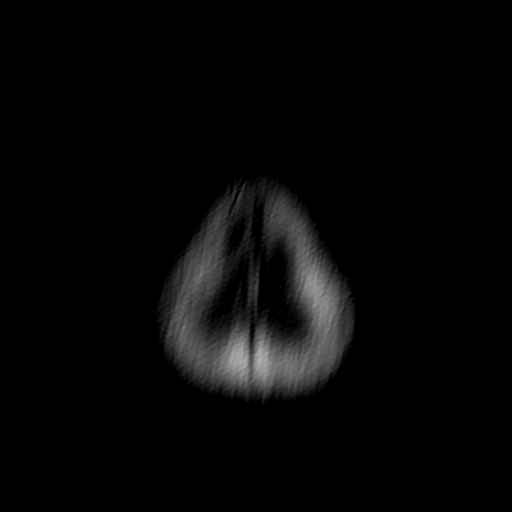

[Series 300: DWI · axial · 3.0mm · 1.09mm/px · z∈[-57,+72]mm · 6 of 44 slices shown (3 of 4)]
[im 1/44]
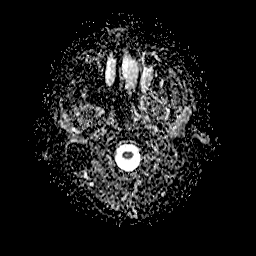
[im 9/44]
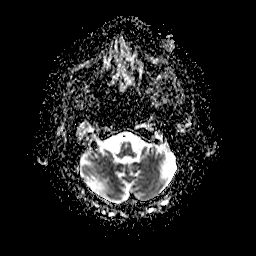
[im 18/44]
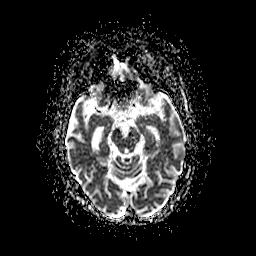
[im 26/44]
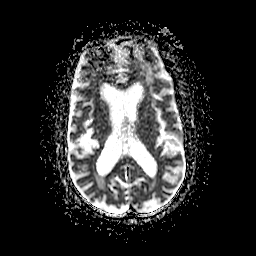
[im 35/44]
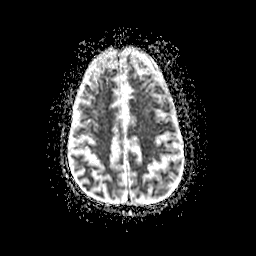
[im 44/44]
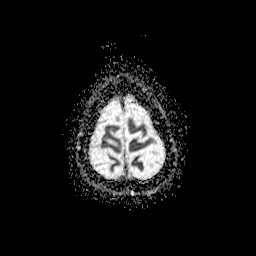

[Series 400: DWI · coronal · 5.0mm · 1.09mm/px · 5 of 36 slices shown (4 of 4)]
[im 1/36]
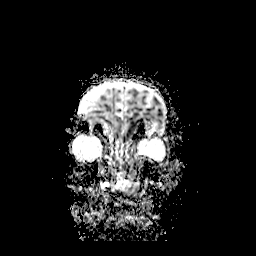
[im 9/36]
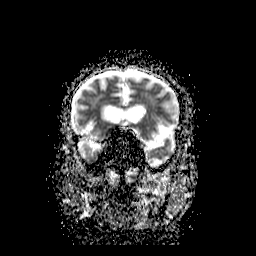
[im 18/36]
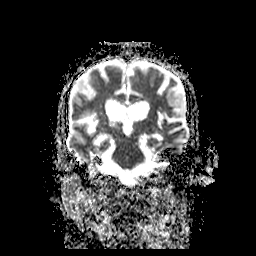
[im 27/36]
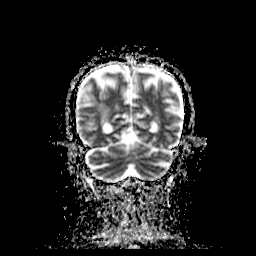
[im 36/36]
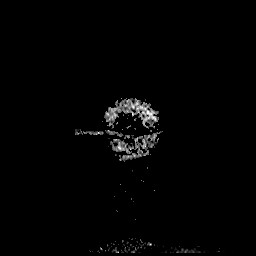

[40 of 48 positions shown; findings below may reference images not displayed]

FINDINGS: Brain: Motion degraded study. No reduced diffusion to suggest acute
or early subacute infarction. Stable brain parenchymal volume loss
and chronic microvascular ischemic changes. Stable ventricle size.
No interval susceptibility hypointensity to indicate intracranial
hemorrhage. No focal mass effect or extra-axial collection. Small
chronic lacunar infarctions are present within right putamen,
thalamus, and caudate body.

Vascular: Normal flow voids.

Skull and upper cervical spine: Normal marrow signal.

Sinuses/Orbits: Negative.

Other: None.
IMPRESSION: 1. Extensive motion degradation.
2. No acute intracranial abnormality identified.
3. Stable chronic microvascular ischemic changes and parenchymal
volume loss of the brain.

By: Tat Cheong Horng M.D.

## 2017-12-14 IMAGING — DX DG CHEST 1V PORT
1 series · 1 of 1 positions shown · non-contrast
Comparison: 07/05/2017

CLINICAL DATA: Shortness of breath

EXAM:
PORTABLE CHEST 1 VIEW

[chest ap]
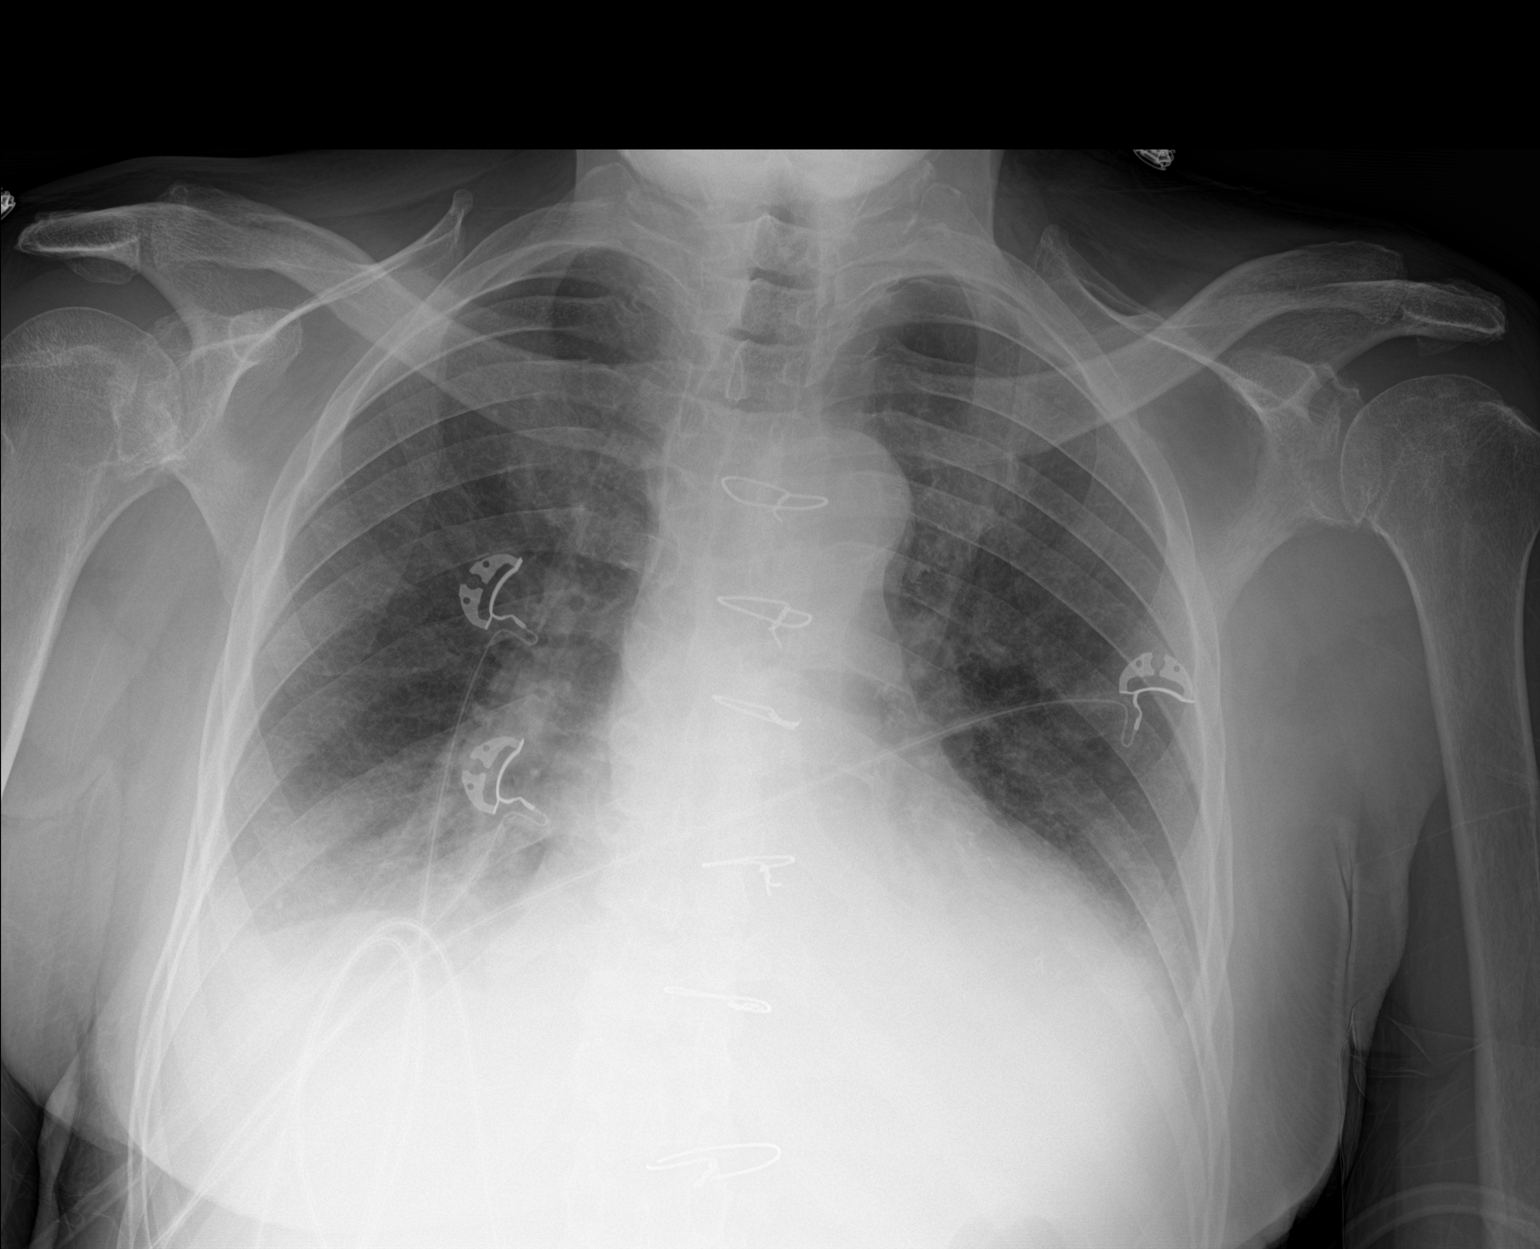

[1 of 1 positions shown; findings below may reference images not displayed]

FINDINGS: Obscuration of both hemidiaphragms with mild enlargement of the
cardiopericardial silhouette. Prior median sternotomy.
Atherosclerotic calcification of the aortic arch.
IMPRESSION: 1. Indistinct obscuration of both hemidiaphragms potentially from
her space opacities or layering pleural effusions. Relative clearing
of the upper lobes compared to 07/05/17.
2. Stable enlargement of the cardiopericardial silhouette.
3.  Aortic Atherosclerosis ([id number]).

## 2017-12-23 ENCOUNTER — Encounter (HOSPITAL_COMMUNITY): Payer: Self-pay | Admitting: Vascular Surgery

## 2018-01-07 ENCOUNTER — Ambulatory Visit: Payer: No Typology Code available for payment source | Admitting: Vascular Surgery

## 2018-02-04 NOTE — Telephone Encounter (Signed)
No note needed/ error 

## 2018-11-28 NOTE — Telephone Encounter (Signed)
done
